# Patient Record
Sex: Female | Born: 1935
Health system: Southern US, Community
[De-identification: ages and names within clinical notes are randomized; demographics above are authoritative.]

## PROBLEM LIST (undated history)

## (undated) DIAGNOSIS — M353 Polymyalgia rheumatica: Secondary | ICD-10-CM

## (undated) DIAGNOSIS — E079 Disorder of thyroid, unspecified: Secondary | ICD-10-CM

## (undated) DIAGNOSIS — I10 Essential (primary) hypertension: Secondary | ICD-10-CM

## (undated) DIAGNOSIS — K432 Incisional hernia without obstruction or gangrene: Secondary | ICD-10-CM

## (undated) DIAGNOSIS — K639 Disease of intestine, unspecified: Secondary | ICD-10-CM

## (undated) DIAGNOSIS — J45909 Unspecified asthma, uncomplicated: Secondary | ICD-10-CM

## (undated) DIAGNOSIS — Z923 Personal history of irradiation: Secondary | ICD-10-CM

## (undated) DIAGNOSIS — L80 Vitiligo: Secondary | ICD-10-CM

## (undated) DIAGNOSIS — M1990 Unspecified osteoarthritis, unspecified site: Secondary | ICD-10-CM

## (undated) DIAGNOSIS — C801 Malignant (primary) neoplasm, unspecified: Secondary | ICD-10-CM

## (undated) HISTORY — DX: Polymyalgia rheumatica: M35.3

## (undated) HISTORY — DX: Malignant (primary) neoplasm, unspecified: C80.1

## (undated) HISTORY — DX: Unspecified asthma, uncomplicated: J45.909

## (undated) HISTORY — DX: Unspecified osteoarthritis, unspecified site: M19.90

## (undated) HISTORY — DX: Disease of intestine, unspecified: K63.9

## (undated) HISTORY — DX: Disorder of thyroid, unspecified: E07.9

## (undated) HISTORY — DX: Vitiligo: L80

## (undated) HISTORY — DX: Essential (primary) hypertension: I10

## (undated) HISTORY — DX: Incisional hernia without obstruction or gangrene: K43.2

---
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
## 1967-09-27 HISTORY — PX: CHOLECYSTECTOMY: SHX55

## 1994-09-26 HISTORY — PX: FOOT SURGERY: SHX648

## 2000-09-26 HISTORY — PX: EYE SURGERY: SHX253

## 2002-09-26 HISTORY — PX: COLON SURGERY: SHX602

## 2004-06-26 ENCOUNTER — Ambulatory Visit: Payer: Self-pay | Admitting: Oncology

## 2004-08-20 ENCOUNTER — Ambulatory Visit: Payer: Self-pay | Admitting: Internal Medicine

## 2004-09-24 ENCOUNTER — Other Ambulatory Visit: Payer: Self-pay

## 2004-09-24 ENCOUNTER — Ambulatory Visit: Payer: Self-pay | Admitting: General Surgery

## 2004-09-26 DIAGNOSIS — C801 Malignant (primary) neoplasm, unspecified: Secondary | ICD-10-CM

## 2004-09-26 HISTORY — DX: Malignant (primary) neoplasm, unspecified: C80.1

## 2004-09-26 HISTORY — PX: BREAST SURGERY: SHX581

## 2004-09-26 HISTORY — PX: BREAST BIOPSY: SHX20

## 2004-09-30 ENCOUNTER — Ambulatory Visit: Payer: Self-pay | Admitting: General Surgery

## 2004-10-08 ENCOUNTER — Ambulatory Visit: Payer: Self-pay | Admitting: Radiation Oncology

## 2004-10-11 ENCOUNTER — Ambulatory Visit: Payer: Self-pay | Admitting: General Surgery

## 2004-10-14 ENCOUNTER — Ambulatory Visit: Payer: Self-pay | Admitting: General Surgery

## 2004-11-01 ENCOUNTER — Ambulatory Visit: Payer: Self-pay | Admitting: Oncology

## 2004-11-08 ENCOUNTER — Ambulatory Visit: Payer: Self-pay | Admitting: General Surgery

## 2004-11-11 ENCOUNTER — Ambulatory Visit: Payer: Self-pay | Admitting: Radiation Oncology

## 2004-11-24 ENCOUNTER — Ambulatory Visit: Payer: Self-pay | Admitting: Radiation Oncology

## 2004-12-25 ENCOUNTER — Ambulatory Visit: Payer: Self-pay | Admitting: Radiation Oncology

## 2005-06-15 ENCOUNTER — Ambulatory Visit: Payer: Self-pay | Admitting: Oncology

## 2005-08-29 ENCOUNTER — Ambulatory Visit: Payer: Self-pay | Admitting: General Surgery

## 2005-09-12 IMAGING — NM NM SENTINAL NODE INJECTION (BREAST) - NO REPORT
1 series · 3 of 3 positions shown · non-contrast
Comparison: none

REASON FOR EXAM: breast CA    OR time 12 oclock
COMMENTS:

[Series 0: breastsentinel · 1.9mm · 1.95mm/px · 3 of 3 frames shown]
[frame 1/3]
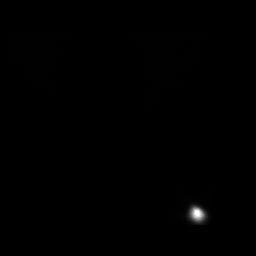
[frame 2/3]
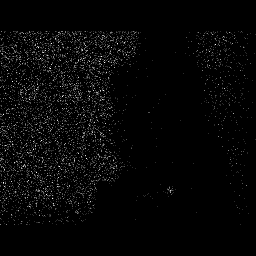
[frame 3/3]
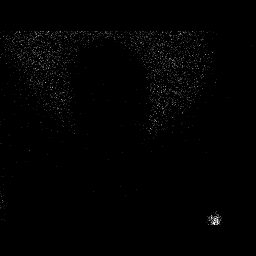

[3 of 3 positions shown; findings below may reference images not displayed]

PROCEDURE:     NM  - NM SENTINEL NODE  BREAST  - October 14, 2004  [DATE]

RESULT:     The patient was informed of the risks and benefits of the
procedure and proper informed consent was obtained.  The patient was brought
to the nuclear medicine suite and placed in a supine position.  The LEFT
breast was evaluated and then prepped and draped in the usual sterile
fashion.  The periareolar region laterally was then anesthetized with
approximately 3 ccs of 1% lidocaine without epinephrine. This area was then
injected with 1.09 mCi of technetium-99m unfiltered Sulfur colloid.
Standard images were obtained in the AP and lateral positions. An area of
increased radiotracer activity is demonstrated within the LEFT axillary
region indicative of a sentinel lymph node.  The patient tolerated the
procedure without complications and was taken to the surgery suite.
IMPRESSION: 1)LEFT breast sentinel node as described above.

## 2005-09-30 IMAGING — CT CT CHEST-ABD-PELV W/ CM
1 of 3 series · 15 of 30 positions shown, 19 images · non-contrast
Comparison: none

REASON FOR EXAM: f/u lymphoma
COMMENTS:

[Series 3: inspace · axial · 0.74mm/px · z∈[-586,-18]mm · 15 of 628 slices shown, 19 images]
[im 30/628  mediastinal]
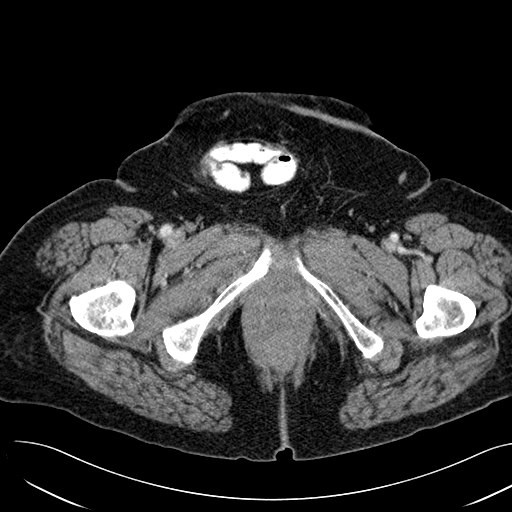
[im 30/628  bone]
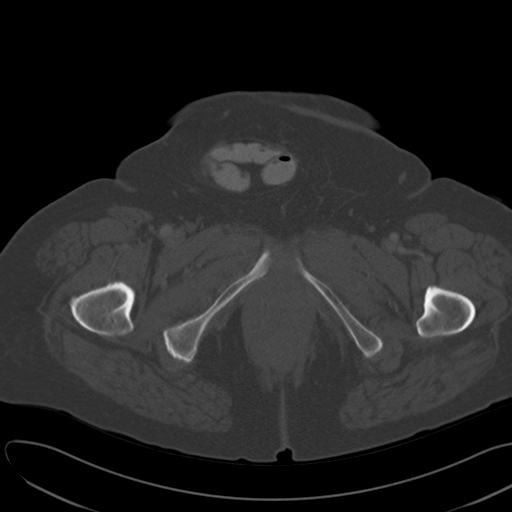
[im 90/628  mediastinal]
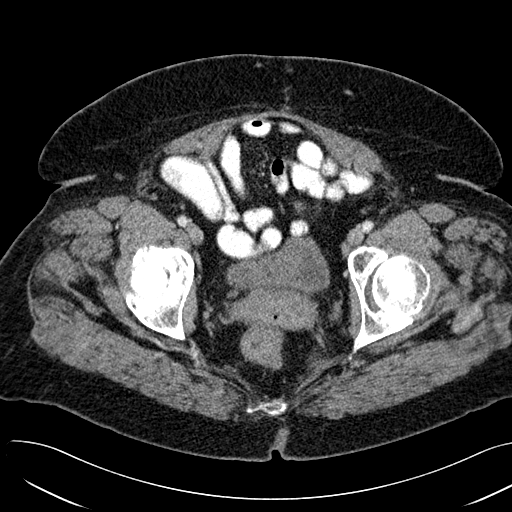
[im 150/628  mediastinal]
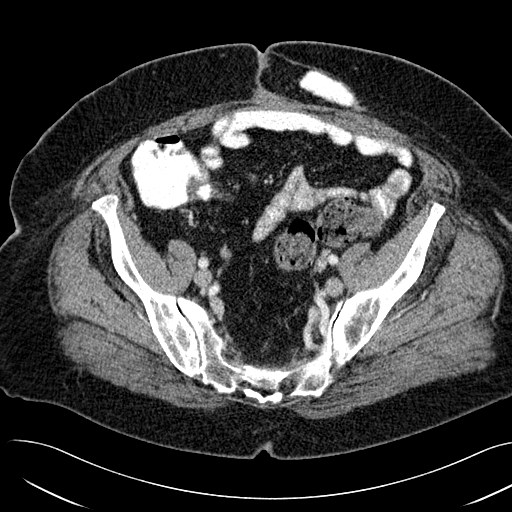
[im 180/628  mediastinal]
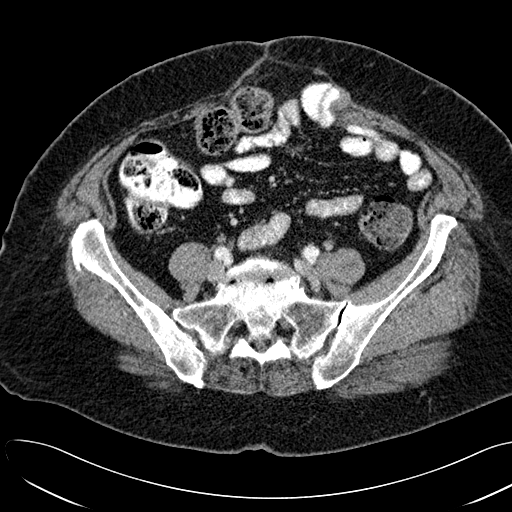
[im 239/628  mediastinal]
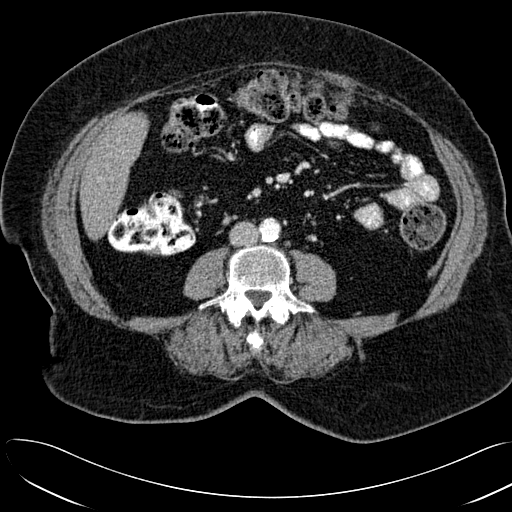
[im 269/628  mediastinal]
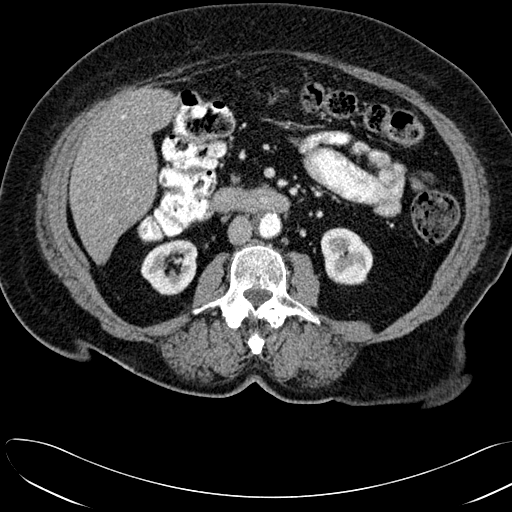
[im 305/628  mediastinal]
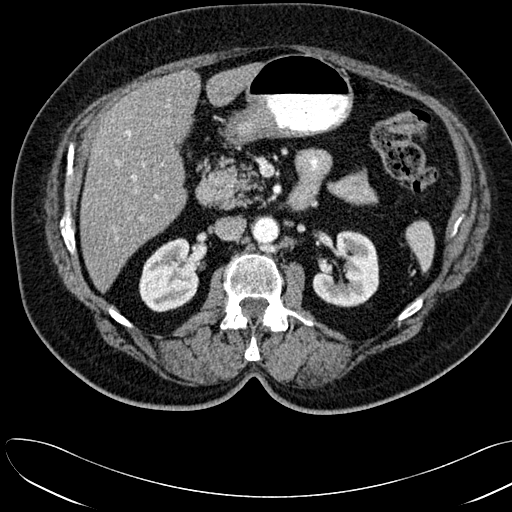
[im 359/628  mediastinal]
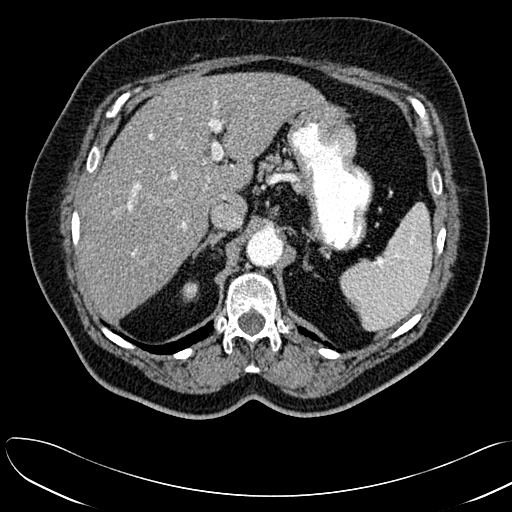
[im 389/628  mediastinal]
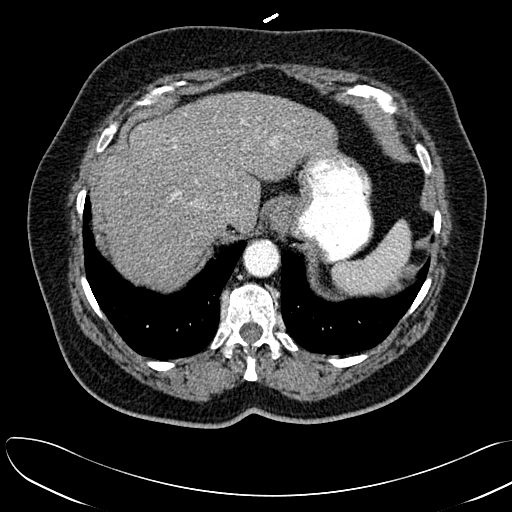
[im 389/628  bone]
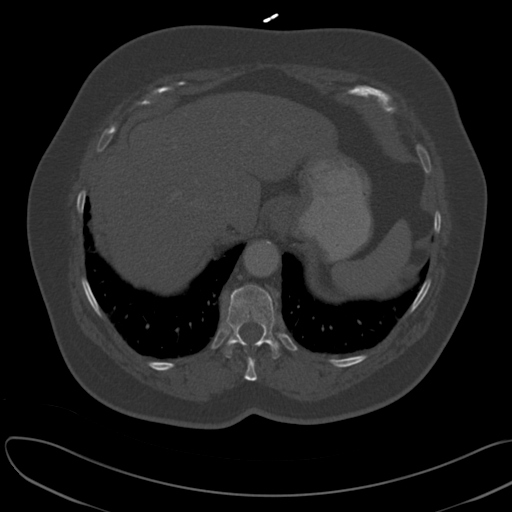
[im 448/628  mediastinal]
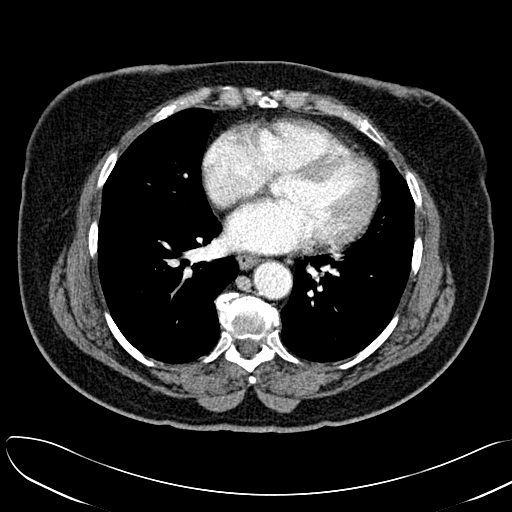
[im 478/628  mediastinal]
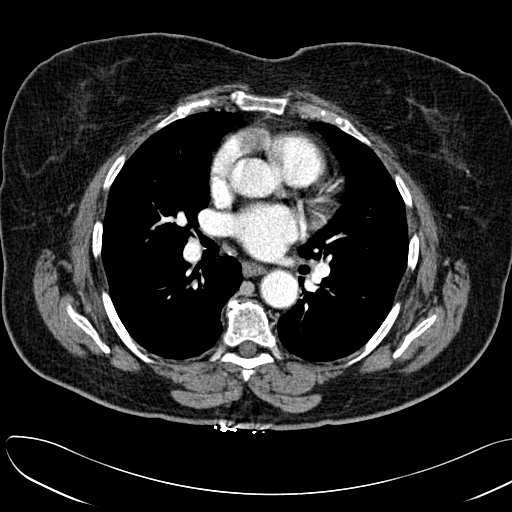
[im 508/628  lung]
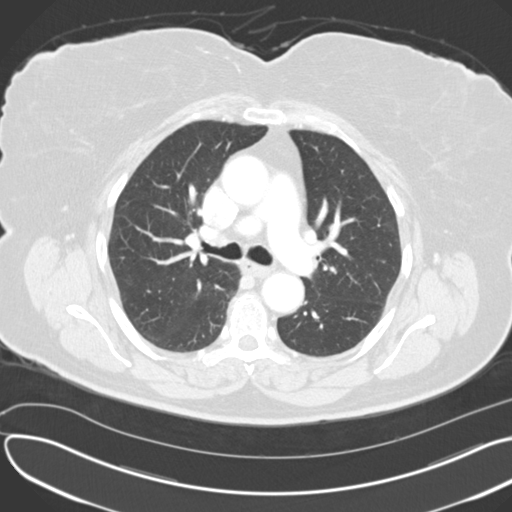
[im 538/628  mediastinal]
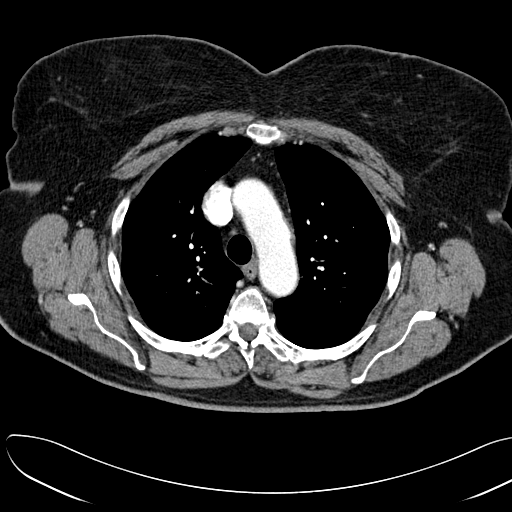
[im 538/628  lung]
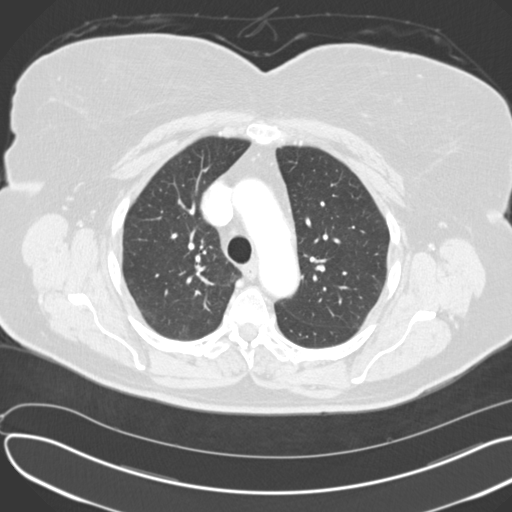
[im 568/628  lung]
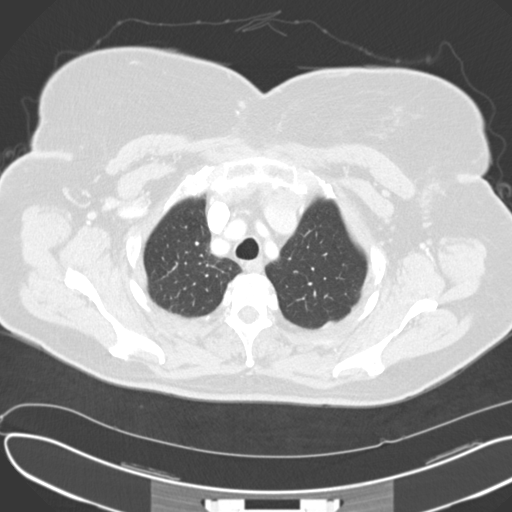
[im 598/628  mediastinal]
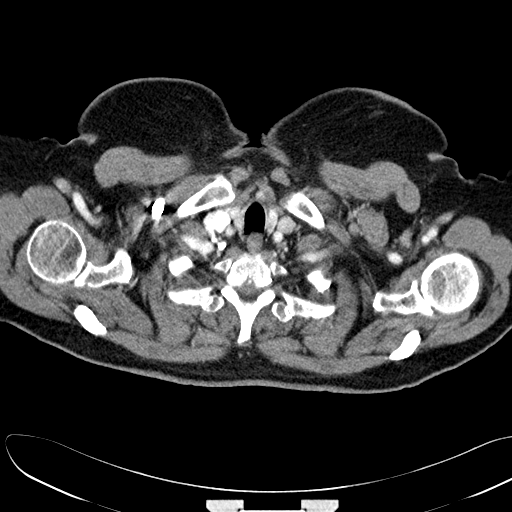
[im 598/628  lung]
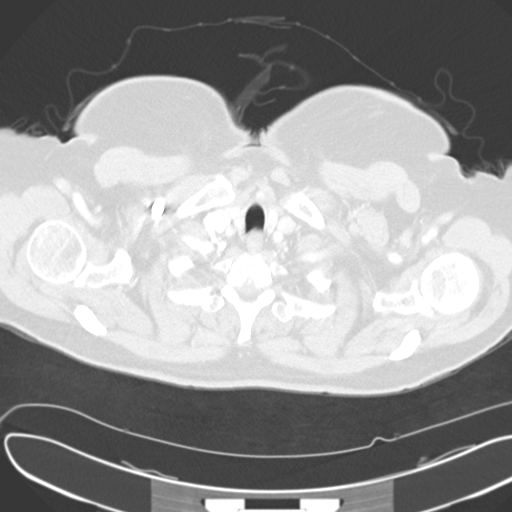

[15 of 30 positions shown; findings below may reference images not displayed]

PROCEDURE:     CT  - CT CHEST ABDOMEN AND PELVIS W  - November 01, 2004  [DATE]

RESULT:        The patient is being re-evaluated for known lymphoma.
Comparison is made to study 04 June, 2003.  At that time, no findings
of active lymphoma were seen.

The patient received 100 cc of Gsovue-5YO for this study.  Within the
mediastinum and hilar regions, I see no pathologic size lymph nodes.  The
retrosternal soft tissues and the axillary regions appear normal.  The
cardiac chambers are top normal in size.  I see no pleural nor pericardial
effusion.  At lung window settings, I see no interstitial nor alveolar
infiltrates.  No abnormal pulmonary nodules are seen.

CT ABDOMEN AND PELVIS

The patient received the aforementioned IV contrast as well as oral
contrast.

The liver exhibits normal density with no focal mass nor evidence of ductal
dilation.  The gallbladder is either contracted or surgically absent.  The
spleen is normal in size and density.  The pancreas, adrenal glands, and
kidneys are normal in appearance.  The para-aortic and pericaval regions
exhibit no evidence of lymphadenopathy.   There is some herniation of fat
into the periumbilical regions and there is a loop of small bowel that
extends into this hernia.  There is no evidence of obstruction here,
however.  This hernia has developed, however, since the prior study.  There
is no free fluid in the abdomen or pelvis.  No pelvic masses are seen.  The
urinary bladder and cervix and atrophic uterus are normal in appearance.
IMPRESSION: I do not see evidence of active lymphoma within the thorax.  No significant
scarring is seen either.

I see no acute abnormality otherwise in the thorax.

I do not see evidence of active lymphoma within the abdomen or pelvis.
Since the prior study there has developed an umbilical hernia to the LEFT of
midline into which a loop of small bowel has migrated.  This does not appear
to be obstructed at this point.

## 2005-11-16 ENCOUNTER — Ambulatory Visit: Payer: Self-pay | Admitting: Radiation Oncology

## 2005-12-12 ENCOUNTER — Ambulatory Visit: Payer: Self-pay | Admitting: Oncology

## 2006-06-12 ENCOUNTER — Ambulatory Visit: Payer: Self-pay | Admitting: Oncology

## 2006-06-26 ENCOUNTER — Ambulatory Visit: Payer: Self-pay | Admitting: Oncology

## 2006-09-05 ENCOUNTER — Ambulatory Visit: Payer: Self-pay | Admitting: General Surgery

## 2006-12-07 ENCOUNTER — Ambulatory Visit: Payer: Self-pay | Admitting: Oncology

## 2006-12-26 ENCOUNTER — Ambulatory Visit: Payer: Self-pay | Admitting: Oncology

## 2007-01-02 ENCOUNTER — Ambulatory Visit: Payer: Self-pay | Admitting: Family Medicine

## 2007-01-10 ENCOUNTER — Ambulatory Visit: Payer: Self-pay | Admitting: Gastroenterology

## 2007-03-21 ENCOUNTER — Ambulatory Visit: Payer: Self-pay | Admitting: Internal Medicine

## 2007-06-27 ENCOUNTER — Ambulatory Visit: Payer: Self-pay | Admitting: Oncology

## 2007-06-28 ENCOUNTER — Ambulatory Visit: Payer: Self-pay | Admitting: Oncology

## 2007-07-28 ENCOUNTER — Ambulatory Visit: Payer: Self-pay | Admitting: Oncology

## 2007-09-10 ENCOUNTER — Ambulatory Visit: Payer: Self-pay | Admitting: Oncology

## 2008-02-25 ENCOUNTER — Ambulatory Visit: Payer: Self-pay | Admitting: Oncology

## 2008-09-12 ENCOUNTER — Ambulatory Visit: Payer: Self-pay | Admitting: General Surgery

## 2009-03-04 ENCOUNTER — Ambulatory Visit: Payer: Self-pay | Admitting: Internal Medicine

## 2009-08-07 ENCOUNTER — Ambulatory Visit: Payer: Self-pay | Admitting: Anesthesiology

## 2009-09-14 ENCOUNTER — Ambulatory Visit: Payer: Self-pay | Admitting: General Surgery

## 2009-09-23 ENCOUNTER — Ambulatory Visit: Payer: Self-pay | Admitting: General Surgery

## 2009-09-26 HISTORY — PX: OTHER SURGICAL HISTORY: SHX169

## 2010-04-01 ENCOUNTER — Observation Stay: Payer: Self-pay | Admitting: Internal Medicine

## 2010-06-15 ENCOUNTER — Ambulatory Visit: Payer: Self-pay | Admitting: General Surgery

## 2010-09-26 DIAGNOSIS — L80 Vitiligo: Secondary | ICD-10-CM

## 2010-09-26 HISTORY — DX: Vitiligo: L80

## 2010-10-18 ENCOUNTER — Ambulatory Visit: Payer: Self-pay | Admitting: General Surgery

## 2010-10-20 ENCOUNTER — Ambulatory Visit: Payer: Self-pay | Admitting: General Surgery

## 2010-11-15 ENCOUNTER — Ambulatory Visit: Payer: Self-pay | Admitting: General Surgery

## 2010-11-16 LAB — PATHOLOGY REPORT

## 2011-04-28 ENCOUNTER — Ambulatory Visit: Payer: Self-pay | Admitting: General Surgery

## 2011-09-27 HISTORY — PX: HERNIA REPAIR: SHX51

## 2011-09-28 DIAGNOSIS — M5412 Radiculopathy, cervical region: Secondary | ICD-10-CM | POA: Diagnosis not present

## 2011-09-28 DIAGNOSIS — M25519 Pain in unspecified shoulder: Secondary | ICD-10-CM | POA: Diagnosis not present

## 2011-09-28 DIAGNOSIS — G5600 Carpal tunnel syndrome, unspecified upper limb: Secondary | ICD-10-CM | POA: Diagnosis not present

## 2011-10-28 DIAGNOSIS — M069 Rheumatoid arthritis, unspecified: Secondary | ICD-10-CM | POA: Diagnosis not present

## 2011-11-01 DIAGNOSIS — M159 Polyosteoarthritis, unspecified: Secondary | ICD-10-CM | POA: Diagnosis not present

## 2011-11-01 DIAGNOSIS — M353 Polymyalgia rheumatica: Secondary | ICD-10-CM | POA: Diagnosis not present

## 2011-11-03 DIAGNOSIS — M064 Inflammatory polyarthropathy: Secondary | ICD-10-CM | POA: Diagnosis not present

## 2011-11-03 DIAGNOSIS — I10 Essential (primary) hypertension: Secondary | ICD-10-CM | POA: Diagnosis not present

## 2011-11-03 DIAGNOSIS — J45909 Unspecified asthma, uncomplicated: Secondary | ICD-10-CM | POA: Diagnosis not present

## 2011-11-03 DIAGNOSIS — M159 Polyosteoarthritis, unspecified: Secondary | ICD-10-CM | POA: Diagnosis not present

## 2011-11-28 ENCOUNTER — Ambulatory Visit: Payer: Self-pay | Admitting: General Surgery

## 2011-11-28 DIAGNOSIS — Z853 Personal history of malignant neoplasm of breast: Secondary | ICD-10-CM | POA: Diagnosis not present

## 2011-11-28 DIAGNOSIS — M353 Polymyalgia rheumatica: Secondary | ICD-10-CM | POA: Diagnosis not present

## 2011-11-28 DIAGNOSIS — R928 Other abnormal and inconclusive findings on diagnostic imaging of breast: Secondary | ICD-10-CM | POA: Diagnosis not present

## 2011-12-06 DIAGNOSIS — Z853 Personal history of malignant neoplasm of breast: Secondary | ICD-10-CM | POA: Diagnosis not present

## 2011-12-06 DIAGNOSIS — M159 Polyosteoarthritis, unspecified: Secondary | ICD-10-CM | POA: Diagnosis not present

## 2011-12-06 DIAGNOSIS — M353 Polymyalgia rheumatica: Secondary | ICD-10-CM | POA: Diagnosis not present

## 2012-01-03 DIAGNOSIS — M353 Polymyalgia rheumatica: Secondary | ICD-10-CM | POA: Diagnosis not present

## 2012-01-27 DIAGNOSIS — M353 Polymyalgia rheumatica: Secondary | ICD-10-CM | POA: Diagnosis not present

## 2012-02-08 DIAGNOSIS — J019 Acute sinusitis, unspecified: Secondary | ICD-10-CM | POA: Diagnosis not present

## 2012-02-08 DIAGNOSIS — J069 Acute upper respiratory infection, unspecified: Secondary | ICD-10-CM | POA: Diagnosis not present

## 2012-02-21 DIAGNOSIS — Z79899 Other long term (current) drug therapy: Secondary | ICD-10-CM | POA: Diagnosis not present

## 2012-03-01 DIAGNOSIS — M316 Other giant cell arteritis: Secondary | ICD-10-CM | POA: Diagnosis not present

## 2012-03-01 DIAGNOSIS — M353 Polymyalgia rheumatica: Secondary | ICD-10-CM | POA: Diagnosis not present

## 2012-03-01 DIAGNOSIS — M159 Polyosteoarthritis, unspecified: Secondary | ICD-10-CM | POA: Diagnosis not present

## 2012-03-07 DIAGNOSIS — Z79899 Other long term (current) drug therapy: Secondary | ICD-10-CM | POA: Diagnosis not present

## 2012-03-12 DIAGNOSIS — R0602 Shortness of breath: Secondary | ICD-10-CM | POA: Diagnosis not present

## 2012-03-12 DIAGNOSIS — M064 Inflammatory polyarthropathy: Secondary | ICD-10-CM | POA: Diagnosis not present

## 2012-03-12 DIAGNOSIS — M159 Polyosteoarthritis, unspecified: Secondary | ICD-10-CM | POA: Diagnosis not present

## 2012-03-12 DIAGNOSIS — I10 Essential (primary) hypertension: Secondary | ICD-10-CM | POA: Diagnosis not present

## 2012-03-12 DIAGNOSIS — Z85038 Personal history of other malignant neoplasm of large intestine: Secondary | ICD-10-CM | POA: Diagnosis not present

## 2012-03-21 DIAGNOSIS — M353 Polymyalgia rheumatica: Secondary | ICD-10-CM | POA: Diagnosis not present

## 2012-04-17 DIAGNOSIS — Z79899 Other long term (current) drug therapy: Secondary | ICD-10-CM | POA: Diagnosis not present

## 2012-04-26 DIAGNOSIS — R1904 Left lower quadrant abdominal swelling, mass and lump: Secondary | ICD-10-CM | POA: Diagnosis not present

## 2012-04-26 DIAGNOSIS — Z86010 Personal history of colonic polyps: Secondary | ICD-10-CM | POA: Diagnosis not present

## 2012-04-30 ENCOUNTER — Inpatient Hospital Stay: Payer: Self-pay | Admitting: Surgery

## 2012-04-30 DIAGNOSIS — N390 Urinary tract infection, site not specified: Secondary | ICD-10-CM | POA: Diagnosis not present

## 2012-04-30 DIAGNOSIS — Z808 Family history of malignant neoplasm of other organs or systems: Secondary | ICD-10-CM | POA: Diagnosis not present

## 2012-04-30 DIAGNOSIS — Z9049 Acquired absence of other specified parts of digestive tract: Secondary | ICD-10-CM | POA: Diagnosis not present

## 2012-04-30 DIAGNOSIS — K439 Ventral hernia without obstruction or gangrene: Secondary | ICD-10-CM | POA: Diagnosis not present

## 2012-04-30 DIAGNOSIS — R109 Unspecified abdominal pain: Secondary | ICD-10-CM | POA: Diagnosis not present

## 2012-04-30 DIAGNOSIS — K430 Incisional hernia with obstruction, without gangrene: Secondary | ICD-10-CM | POA: Diagnosis not present

## 2012-04-30 DIAGNOSIS — I10 Essential (primary) hypertension: Secondary | ICD-10-CM | POA: Diagnosis present

## 2012-04-30 DIAGNOSIS — Z9089 Acquired absence of other organs: Secondary | ICD-10-CM | POA: Diagnosis not present

## 2012-04-30 DIAGNOSIS — Z836 Family history of other diseases of the respiratory system: Secondary | ICD-10-CM | POA: Diagnosis not present

## 2012-04-30 DIAGNOSIS — Z853 Personal history of malignant neoplasm of breast: Secondary | ICD-10-CM | POA: Diagnosis not present

## 2012-04-30 DIAGNOSIS — M069 Rheumatoid arthritis, unspecified: Secondary | ICD-10-CM | POA: Diagnosis present

## 2012-04-30 DIAGNOSIS — Z801 Family history of malignant neoplasm of trachea, bronchus and lung: Secondary | ICD-10-CM | POA: Diagnosis not present

## 2012-04-30 DIAGNOSIS — Z886 Allergy status to analgesic agent status: Secondary | ICD-10-CM | POA: Diagnosis not present

## 2012-04-30 DIAGNOSIS — Z885 Allergy status to narcotic agent status: Secondary | ICD-10-CM | POA: Diagnosis not present

## 2012-04-30 DIAGNOSIS — Z881 Allergy status to other antibiotic agents status: Secondary | ICD-10-CM | POA: Diagnosis not present

## 2012-04-30 DIAGNOSIS — K56609 Unspecified intestinal obstruction, unspecified as to partial versus complete obstruction: Secondary | ICD-10-CM | POA: Diagnosis not present

## 2012-04-30 LAB — URINALYSIS, COMPLETE
Bacteria: NONE SEEN
Bilirubin,UR: NEGATIVE
Glucose,UR: NEGATIVE mg/dL (ref 0–75)
Nitrite: NEGATIVE
Protein: 30
RBC,UR: 20 /HPF (ref 0–5)
Specific Gravity: 1.036 (ref 1.003–1.030)
Squamous Epithelial: 1
WBC UR: 28 /HPF (ref 0–5)

## 2012-04-30 LAB — DIFFERENTIAL
Basophil %: 0.5 %
Eosinophil #: 0.1 10*3/uL (ref 0.0–0.7)
Eosinophil %: 0.5 %
Lymphocyte #: 1.6 10*3/uL (ref 1.0–3.6)
Monocyte #: 0.7 x10 3/mm (ref 0.2–0.9)
Neutrophil #: 10.1 10*3/uL — ABNORMAL HIGH (ref 1.4–6.5)

## 2012-04-30 LAB — COMPREHENSIVE METABOLIC PANEL
Anion Gap: 8 (ref 7–16)
BUN: 14 mg/dL (ref 7–18)
Bilirubin,Total: 0.3 mg/dL (ref 0.2–1.0)
Chloride: 101 mmol/L (ref 98–107)
Co2: 30 mmol/L (ref 21–32)
EGFR (African American): >60
EGFR (Non-African Amer.): >60
Osmolality: 284 (ref 275–301)
Potassium: 3.9 mmol/L (ref 3.5–5.1)
SGOT(AST): 20 U/L (ref 15–37)
SGPT (ALT): 22 U/L (ref 12–78)
Sodium: 139 mmol/L (ref 136–145)
Total Protein: 7.7 g/dL (ref 6.4–8.2)

## 2012-04-30 LAB — CBC
HCT: 40.6 % (ref 35.0–47.0)
HGB: 13.3 g/dL (ref 12.0–16.0)
MCH: 29 pg (ref 26.0–34.0)
MCHC: 32.7 g/dL (ref 32.0–36.0)

## 2012-04-30 LAB — LIPASE, BLOOD: Lipase: 151 U/L (ref 73–393)

## 2012-05-01 DIAGNOSIS — N390 Urinary tract infection, site not specified: Secondary | ICD-10-CM | POA: Diagnosis not present

## 2012-05-01 DIAGNOSIS — K430 Incisional hernia with obstruction, without gangrene: Secondary | ICD-10-CM | POA: Diagnosis not present

## 2012-05-01 LAB — CBC WITH DIFFERENTIAL/PLATELET
Basophil %: 0.3 %
HCT: 34.4 % — ABNORMAL LOW (ref 35.0–47.0)
Lymphocyte #: 2.1 10*3/uL (ref 1.0–3.6)
Lymphocyte %: 18.1 %
MCH: 30.1 pg (ref 26.0–34.0)
MCHC: 34.2 g/dL (ref 32.0–36.0)
MCV: 88 fL (ref 80–100)
Monocyte #: 0.9 x10 3/mm (ref 0.2–0.9)
Neutrophil #: 8.2 10*3/uL — ABNORMAL HIGH (ref 1.4–6.5)
Platelet: 220 10*3/uL (ref 150–440)
RDW: 16.2 % — ABNORMAL HIGH (ref 11.5–14.5)

## 2012-05-01 LAB — BASIC METABOLIC PANEL
BUN: 12 mg/dL (ref 7–18)
Creatinine: 0.81 mg/dL (ref 0.60–1.30)
EGFR (African American): >60
EGFR (Non-African Amer.): >60
Glucose: 116 mg/dL — ABNORMAL HIGH (ref 65–99)
Osmolality: 280 (ref 275–301)
Potassium: 4 mmol/L (ref 3.5–5.1)
Sodium: 140 mmol/L (ref 136–145)

## 2012-05-02 DIAGNOSIS — N390 Urinary tract infection, site not specified: Secondary | ICD-10-CM | POA: Diagnosis not present

## 2012-05-02 DIAGNOSIS — K430 Incisional hernia with obstruction, without gangrene: Secondary | ICD-10-CM | POA: Diagnosis not present

## 2012-05-02 LAB — CBC WITH DIFFERENTIAL/PLATELET
Basophil #: 0 10*3/uL (ref 0.0–0.1)
Basophil %: 0.5 %
Eosinophil %: 1.5 %
HCT: 33.8 % — ABNORMAL LOW (ref 35.0–47.0)
Lymphocyte #: 3 10*3/uL (ref 1.0–3.6)
Lymphocyte %: 32.5 %
MCH: 30.7 pg (ref 26.0–34.0)
MCV: 89 fL (ref 80–100)
Monocyte #: 0.7 x10 3/mm (ref 0.2–0.9)
Neutrophil %: 57.5 %
Platelet: 198 10*3/uL (ref 150–440)
RDW: 16.1 % — ABNORMAL HIGH (ref 11.5–14.5)

## 2012-05-09 DIAGNOSIS — R6889 Other general symptoms and signs: Secondary | ICD-10-CM | POA: Diagnosis not present

## 2012-05-10 ENCOUNTER — Inpatient Hospital Stay: Payer: Self-pay | Admitting: Surgery

## 2012-05-10 DIAGNOSIS — K436 Other and unspecified ventral hernia with obstruction, without gangrene: Secondary | ICD-10-CM | POA: Diagnosis not present

## 2012-05-10 DIAGNOSIS — K430 Incisional hernia with obstruction, without gangrene: Secondary | ICD-10-CM | POA: Diagnosis not present

## 2012-05-10 DIAGNOSIS — Z885 Allergy status to narcotic agent status: Secondary | ICD-10-CM | POA: Diagnosis not present

## 2012-05-10 DIAGNOSIS — Z9049 Acquired absence of other specified parts of digestive tract: Secondary | ICD-10-CM | POA: Diagnosis not present

## 2012-05-10 DIAGNOSIS — R109 Unspecified abdominal pain: Secondary | ICD-10-CM | POA: Diagnosis not present

## 2012-05-10 DIAGNOSIS — K439 Ventral hernia without obstruction or gangrene: Secondary | ICD-10-CM | POA: Diagnosis not present

## 2012-05-10 DIAGNOSIS — M069 Rheumatoid arthritis, unspecified: Secondary | ICD-10-CM | POA: Diagnosis not present

## 2012-05-10 DIAGNOSIS — K56609 Unspecified intestinal obstruction, unspecified as to partial versus complete obstruction: Secondary | ICD-10-CM | POA: Diagnosis not present

## 2012-05-10 DIAGNOSIS — Z886 Allergy status to analgesic agent status: Secondary | ICD-10-CM | POA: Diagnosis not present

## 2012-05-10 DIAGNOSIS — Z01810 Encounter for preprocedural cardiovascular examination: Secondary | ICD-10-CM

## 2012-05-10 DIAGNOSIS — R110 Nausea: Secondary | ICD-10-CM | POA: Diagnosis not present

## 2012-05-10 DIAGNOSIS — I10 Essential (primary) hypertension: Secondary | ICD-10-CM | POA: Diagnosis not present

## 2012-05-10 DIAGNOSIS — IMO0002 Reserved for concepts with insufficient information to code with codable children: Secondary | ICD-10-CM | POA: Diagnosis not present

## 2012-05-10 DIAGNOSIS — Z881 Allergy status to other antibiotic agents status: Secondary | ICD-10-CM | POA: Diagnosis not present

## 2012-05-10 DIAGNOSIS — R1110 Vomiting, unspecified: Secondary | ICD-10-CM | POA: Diagnosis not present

## 2012-05-10 DIAGNOSIS — K660 Peritoneal adhesions (postprocedural) (postinfection): Secondary | ICD-10-CM | POA: Diagnosis not present

## 2012-05-10 DIAGNOSIS — Z9089 Acquired absence of other organs: Secondary | ICD-10-CM | POA: Diagnosis not present

## 2012-05-10 LAB — CBC WITH DIFFERENTIAL/PLATELET
Basophil %: 0.9 %
Eosinophil %: 0.2 %
HCT: 39.5 % (ref 35.0–47.0)
HGB: 13.1 g/dL (ref 12.0–16.0)
Lymphocyte %: 20 %
Monocyte %: 6.1 %
Neutrophil #: 9.5 10*3/uL — ABNORMAL HIGH (ref 1.4–6.5)
RBC: 4.49 10*6/uL (ref 3.80–5.20)
WBC: 13 10*3/uL — ABNORMAL HIGH (ref 3.6–11.0)

## 2012-05-10 LAB — COMPREHENSIVE METABOLIC PANEL
Albumin: 3.9 g/dL (ref 3.4–5.0)
Alkaline Phosphatase: 67 U/L (ref 50–136)
Bilirubin,Total: 0.4 mg/dL (ref 0.2–1.0)
Chloride: 103 mmol/L (ref 98–107)
Co2: 25 mmol/L (ref 21–32)
Creatinine: 0.85 mg/dL (ref 0.60–1.30)
EGFR (African American): >60
EGFR (Non-African Amer.): >60
Osmolality: 282 (ref 275–301)
SGOT(AST): 28 U/L (ref 15–37)
SGPT (ALT): 22 U/L (ref 12–78)

## 2012-05-10 LAB — PROTIME-INR: INR: 0.8

## 2012-05-11 LAB — CBC WITH DIFFERENTIAL/PLATELET
Basophil #: 0.1 10*3/uL (ref 0.0–0.1)
Basophil %: 0.5 %
Eosinophil #: 0 10*3/uL (ref 0.0–0.7)
Eosinophil %: 0.2 %
HCT: 35.9 % (ref 35.0–47.0)
HGB: 11.9 g/dL — ABNORMAL LOW (ref 12.0–16.0)
Lymphocyte %: 10.1 %
MCH: 29.4 pg (ref 26.0–34.0)
MCV: 89 fL (ref 80–100)
Monocyte %: 3.9 %
Neutrophil #: 9.9 10*3/uL — ABNORMAL HIGH (ref 1.4–6.5)
Neutrophil %: 85.3 %
Platelet: 260 10*3/uL (ref 150–440)
RBC: 4.05 10*6/uL (ref 3.80–5.20)
RDW: 15.9 % — ABNORMAL HIGH (ref 11.5–14.5)
WBC: 11.7 10*3/uL — ABNORMAL HIGH (ref 3.6–11.0)

## 2012-05-11 LAB — BASIC METABOLIC PANEL
Anion Gap: 5 — ABNORMAL LOW (ref 7–16)
BUN: 14 mg/dL (ref 7–18)
Chloride: 103 mmol/L (ref 98–107)
Co2: 26 mmol/L (ref 21–32)
Creatinine: 1 mg/dL (ref 0.60–1.30)
Osmolality: 280 (ref 275–301)
Potassium: 6.2 mmol/L — ABNORMAL HIGH (ref 3.5–5.1)
Sodium: 134 mmol/L — ABNORMAL LOW (ref 136–145)

## 2012-05-11 LAB — POTASSIUM: Potassium: 4.5 mmol/L (ref 3.5–5.1)

## 2012-05-12 LAB — CBC WITH DIFFERENTIAL/PLATELET
Basophil #: 0 10*3/uL (ref 0.0–0.1)
Basophil %: 0.1 %
Eosinophil #: 0 10*3/uL (ref 0.0–0.7)
Eosinophil %: 0 %
HGB: 10.7 g/dL — ABNORMAL LOW (ref 12.0–16.0)
Lymphocyte #: 1.2 10*3/uL (ref 1.0–3.6)
Lymphocyte %: 8.1 %
MCH: 29 pg (ref 26.0–34.0)
MCHC: 32.6 g/dL (ref 32.0–36.0)
MCV: 89 fL (ref 80–100)
Monocyte #: 1.3 x10 3/mm — ABNORMAL HIGH (ref 0.2–0.9)
Monocyte %: 8.6 %
Neutrophil #: 12.7 10*3/uL — ABNORMAL HIGH (ref 1.4–6.5)
Neutrophil %: 83.2 %
RBC: 3.7 10*6/uL — ABNORMAL LOW (ref 3.80–5.20)
RDW: 15.9 % — ABNORMAL HIGH (ref 11.5–14.5)

## 2012-05-12 LAB — BASIC METABOLIC PANEL
BUN: 16 mg/dL (ref 7–18)
Calcium, Total: 8.8 mg/dL (ref 8.5–10.1)
Chloride: 105 mmol/L (ref 98–107)
Co2: 23 mmol/L (ref 21–32)
Creatinine: 1.1 mg/dL (ref 0.60–1.30)
EGFR (African American): 54 — ABNORMAL LOW
EGFR (African American): >60
EGFR (Non-African Amer.): 46 — ABNORMAL LOW
EGFR (Non-African Amer.): >60
Glucose: 162 mg/dL — ABNORMAL HIGH (ref 65–99)
Osmolality: 274 (ref 275–301)
Potassium: 5.6 mmol/L — ABNORMAL HIGH (ref 3.5–5.1)
Sodium: 135 mmol/L — ABNORMAL LOW (ref 136–145)

## 2012-05-13 LAB — CBC WITH DIFFERENTIAL/PLATELET
Basophil #: 0 10*3/uL (ref 0.0–0.1)
Basophil %: 0.2 %
Eosinophil #: 0 10*3/uL (ref 0.0–0.7)
HGB: 8.9 g/dL — ABNORMAL LOW (ref 12.0–16.0)
Lymphocyte %: 9 %
MCH: 28.9 pg (ref 26.0–34.0)
MCHC: 32.3 g/dL (ref 32.0–36.0)
Monocyte #: 1 x10 3/mm — ABNORMAL HIGH (ref 0.2–0.9)
Monocyte %: 8.1 %
Neutrophil %: 82.7 %
Platelet: 207 10*3/uL (ref 150–440)
RDW: 16 % — ABNORMAL HIGH (ref 11.5–14.5)

## 2012-05-13 LAB — BASIC METABOLIC PANEL
Anion Gap: 7 (ref 7–16)
BUN: 12 mg/dL (ref 7–18)
Creatinine: 0.89 mg/dL (ref 0.60–1.30)
EGFR (Non-African Amer.): 60 — ABNORMAL LOW
Glucose: 172 mg/dL — ABNORMAL HIGH (ref 65–99)
Osmolality: 276 (ref 275–301)
Potassium: 4.1 mmol/L (ref 3.5–5.1)
Sodium: 136 mmol/L (ref 136–145)

## 2012-05-14 LAB — CBC WITH DIFFERENTIAL/PLATELET
Basophil %: 0 %
Eosinophil #: 0 10*3/uL (ref 0.0–0.7)
Eosinophil %: 0.1 %
HCT: 27.9 % — ABNORMAL LOW (ref 35.0–47.0)
HGB: 9 g/dL — ABNORMAL LOW (ref 12.0–16.0)
Lymphocyte #: 0.9 10*3/uL — ABNORMAL LOW (ref 1.0–3.6)
MCH: 28.6 pg (ref 26.0–34.0)
MCHC: 32.4 g/dL (ref 32.0–36.0)
MCV: 88 fL (ref 80–100)
Monocyte #: 0.9 x10 3/mm (ref 0.2–0.9)
Monocyte %: 7.7 %
Neutrophil #: 10 10*3/uL — ABNORMAL HIGH (ref 1.4–6.5)
Platelet: 242 10*3/uL (ref 150–440)
RBC: 3.16 10*6/uL — ABNORMAL LOW (ref 3.80–5.20)

## 2012-05-14 LAB — BASIC METABOLIC PANEL
Calcium, Total: 9.2 mg/dL (ref 8.5–10.1)
Chloride: 102 mmol/L (ref 98–107)
Co2: 27 mmol/L (ref 21–32)
EGFR (African American): >60
Glucose: 163 mg/dL — ABNORMAL HIGH (ref 65–99)
Sodium: 138 mmol/L (ref 136–145)

## 2012-05-21 DIAGNOSIS — M159 Polyosteoarthritis, unspecified: Secondary | ICD-10-CM | POA: Diagnosis not present

## 2012-05-21 DIAGNOSIS — IMO0001 Reserved for inherently not codable concepts without codable children: Secondary | ICD-10-CM | POA: Diagnosis not present

## 2012-05-21 DIAGNOSIS — Z853 Personal history of malignant neoplasm of breast: Secondary | ICD-10-CM | POA: Diagnosis not present

## 2012-05-21 DIAGNOSIS — Z48815 Encounter for surgical aftercare following surgery on the digestive system: Secondary | ICD-10-CM | POA: Diagnosis not present

## 2012-05-21 DIAGNOSIS — M069 Rheumatoid arthritis, unspecified: Secondary | ICD-10-CM | POA: Diagnosis not present

## 2012-05-21 DIAGNOSIS — I10 Essential (primary) hypertension: Secondary | ICD-10-CM | POA: Diagnosis not present

## 2012-05-22 DIAGNOSIS — IMO0001 Reserved for inherently not codable concepts without codable children: Secondary | ICD-10-CM | POA: Diagnosis not present

## 2012-05-22 DIAGNOSIS — Z853 Personal history of malignant neoplasm of breast: Secondary | ICD-10-CM | POA: Diagnosis not present

## 2012-05-22 DIAGNOSIS — M069 Rheumatoid arthritis, unspecified: Secondary | ICD-10-CM | POA: Diagnosis not present

## 2012-05-22 DIAGNOSIS — I10 Essential (primary) hypertension: Secondary | ICD-10-CM | POA: Diagnosis not present

## 2012-05-22 DIAGNOSIS — M159 Polyosteoarthritis, unspecified: Secondary | ICD-10-CM | POA: Diagnosis not present

## 2012-05-22 DIAGNOSIS — Z48815 Encounter for surgical aftercare following surgery on the digestive system: Secondary | ICD-10-CM | POA: Diagnosis not present

## 2012-05-24 DIAGNOSIS — Z853 Personal history of malignant neoplasm of breast: Secondary | ICD-10-CM | POA: Diagnosis not present

## 2012-05-24 DIAGNOSIS — M069 Rheumatoid arthritis, unspecified: Secondary | ICD-10-CM | POA: Diagnosis not present

## 2012-05-24 DIAGNOSIS — I10 Essential (primary) hypertension: Secondary | ICD-10-CM | POA: Diagnosis not present

## 2012-05-24 DIAGNOSIS — M159 Polyosteoarthritis, unspecified: Secondary | ICD-10-CM | POA: Diagnosis not present

## 2012-05-24 DIAGNOSIS — Z48815 Encounter for surgical aftercare following surgery on the digestive system: Secondary | ICD-10-CM | POA: Diagnosis not present

## 2012-05-24 DIAGNOSIS — IMO0001 Reserved for inherently not codable concepts without codable children: Secondary | ICD-10-CM | POA: Diagnosis not present

## 2012-05-30 DIAGNOSIS — M159 Polyosteoarthritis, unspecified: Secondary | ICD-10-CM | POA: Diagnosis not present

## 2012-05-30 DIAGNOSIS — IMO0001 Reserved for inherently not codable concepts without codable children: Secondary | ICD-10-CM | POA: Diagnosis not present

## 2012-05-30 DIAGNOSIS — Z853 Personal history of malignant neoplasm of breast: Secondary | ICD-10-CM | POA: Diagnosis not present

## 2012-05-30 DIAGNOSIS — M069 Rheumatoid arthritis, unspecified: Secondary | ICD-10-CM | POA: Diagnosis not present

## 2012-05-30 DIAGNOSIS — Z48815 Encounter for surgical aftercare following surgery on the digestive system: Secondary | ICD-10-CM | POA: Diagnosis not present

## 2012-05-30 DIAGNOSIS — I10 Essential (primary) hypertension: Secondary | ICD-10-CM | POA: Diagnosis not present

## 2012-06-01 DIAGNOSIS — Z853 Personal history of malignant neoplasm of breast: Secondary | ICD-10-CM | POA: Diagnosis not present

## 2012-06-01 DIAGNOSIS — M159 Polyosteoarthritis, unspecified: Secondary | ICD-10-CM | POA: Diagnosis not present

## 2012-06-01 DIAGNOSIS — IMO0001 Reserved for inherently not codable concepts without codable children: Secondary | ICD-10-CM | POA: Diagnosis not present

## 2012-06-01 DIAGNOSIS — I10 Essential (primary) hypertension: Secondary | ICD-10-CM | POA: Diagnosis not present

## 2012-06-01 DIAGNOSIS — Z48815 Encounter for surgical aftercare following surgery on the digestive system: Secondary | ICD-10-CM | POA: Diagnosis not present

## 2012-06-01 DIAGNOSIS — M069 Rheumatoid arthritis, unspecified: Secondary | ICD-10-CM | POA: Diagnosis not present

## 2012-06-06 DIAGNOSIS — M159 Polyosteoarthritis, unspecified: Secondary | ICD-10-CM | POA: Diagnosis not present

## 2012-06-06 DIAGNOSIS — M069 Rheumatoid arthritis, unspecified: Secondary | ICD-10-CM | POA: Diagnosis not present

## 2012-06-06 DIAGNOSIS — I10 Essential (primary) hypertension: Secondary | ICD-10-CM | POA: Diagnosis not present

## 2012-06-06 DIAGNOSIS — Z853 Personal history of malignant neoplasm of breast: Secondary | ICD-10-CM | POA: Diagnosis not present

## 2012-06-06 DIAGNOSIS — Z48815 Encounter for surgical aftercare following surgery on the digestive system: Secondary | ICD-10-CM | POA: Diagnosis not present

## 2012-06-06 DIAGNOSIS — IMO0001 Reserved for inherently not codable concepts without codable children: Secondary | ICD-10-CM | POA: Diagnosis not present

## 2012-06-08 DIAGNOSIS — Z853 Personal history of malignant neoplasm of breast: Secondary | ICD-10-CM | POA: Diagnosis not present

## 2012-06-08 DIAGNOSIS — R109 Unspecified abdominal pain: Secondary | ICD-10-CM | POA: Diagnosis not present

## 2012-06-08 DIAGNOSIS — M316 Other giant cell arteritis: Secondary | ICD-10-CM | POA: Diagnosis not present

## 2012-06-08 DIAGNOSIS — IMO0001 Reserved for inherently not codable concepts without codable children: Secondary | ICD-10-CM | POA: Diagnosis not present

## 2012-06-08 DIAGNOSIS — M069 Rheumatoid arthritis, unspecified: Secondary | ICD-10-CM | POA: Diagnosis not present

## 2012-06-08 DIAGNOSIS — Z48815 Encounter for surgical aftercare following surgery on the digestive system: Secondary | ICD-10-CM | POA: Diagnosis not present

## 2012-06-08 DIAGNOSIS — M159 Polyosteoarthritis, unspecified: Secondary | ICD-10-CM | POA: Diagnosis not present

## 2012-06-08 DIAGNOSIS — I10 Essential (primary) hypertension: Secondary | ICD-10-CM | POA: Diagnosis not present

## 2012-06-12 DIAGNOSIS — M159 Polyosteoarthritis, unspecified: Secondary | ICD-10-CM | POA: Diagnosis not present

## 2012-06-12 DIAGNOSIS — I10 Essential (primary) hypertension: Secondary | ICD-10-CM | POA: Diagnosis not present

## 2012-06-12 DIAGNOSIS — Z853 Personal history of malignant neoplasm of breast: Secondary | ICD-10-CM | POA: Diagnosis not present

## 2012-06-12 DIAGNOSIS — IMO0001 Reserved for inherently not codable concepts without codable children: Secondary | ICD-10-CM | POA: Diagnosis not present

## 2012-06-12 DIAGNOSIS — M069 Rheumatoid arthritis, unspecified: Secondary | ICD-10-CM | POA: Diagnosis not present

## 2012-06-12 DIAGNOSIS — Z48815 Encounter for surgical aftercare following surgery on the digestive system: Secondary | ICD-10-CM | POA: Diagnosis not present

## 2012-06-22 DIAGNOSIS — Z0000 Encounter for general adult medical examination without abnormal findings: Secondary | ICD-10-CM | POA: Diagnosis not present

## 2012-06-22 DIAGNOSIS — I10 Essential (primary) hypertension: Secondary | ICD-10-CM | POA: Diagnosis not present

## 2012-06-22 DIAGNOSIS — E782 Mixed hyperlipidemia: Secondary | ICD-10-CM | POA: Diagnosis not present

## 2012-06-22 DIAGNOSIS — E039 Hypothyroidism, unspecified: Secondary | ICD-10-CM | POA: Diagnosis not present

## 2012-06-22 DIAGNOSIS — D649 Anemia, unspecified: Secondary | ICD-10-CM | POA: Diagnosis not present

## 2012-06-26 DIAGNOSIS — M353 Polymyalgia rheumatica: Secondary | ICD-10-CM | POA: Diagnosis not present

## 2012-07-13 DIAGNOSIS — M159 Polyosteoarthritis, unspecified: Secondary | ICD-10-CM | POA: Diagnosis not present

## 2012-07-13 DIAGNOSIS — M316 Other giant cell arteritis: Secondary | ICD-10-CM | POA: Diagnosis not present

## 2012-07-18 DIAGNOSIS — R0602 Shortness of breath: Secondary | ICD-10-CM | POA: Diagnosis not present

## 2012-07-18 DIAGNOSIS — Z85038 Personal history of other malignant neoplasm of large intestine: Secondary | ICD-10-CM | POA: Diagnosis not present

## 2012-07-18 DIAGNOSIS — R5381 Other malaise: Secondary | ICD-10-CM | POA: Diagnosis not present

## 2012-07-18 DIAGNOSIS — E039 Hypothyroidism, unspecified: Secondary | ICD-10-CM | POA: Diagnosis not present

## 2012-07-18 DIAGNOSIS — D649 Anemia, unspecified: Secondary | ICD-10-CM | POA: Diagnosis not present

## 2012-07-18 DIAGNOSIS — I10 Essential (primary) hypertension: Secondary | ICD-10-CM | POA: Diagnosis not present

## 2012-07-18 DIAGNOSIS — K439 Ventral hernia without obstruction or gangrene: Secondary | ICD-10-CM | POA: Diagnosis not present

## 2012-07-18 DIAGNOSIS — R5383 Other fatigue: Secondary | ICD-10-CM | POA: Diagnosis not present

## 2012-07-18 DIAGNOSIS — M064 Inflammatory polyarthropathy: Secondary | ICD-10-CM | POA: Diagnosis not present

## 2012-07-31 DIAGNOSIS — M353 Polymyalgia rheumatica: Secondary | ICD-10-CM | POA: Diagnosis not present

## 2012-08-07 ENCOUNTER — Other Ambulatory Visit: Payer: Self-pay | Admitting: Rheumatology

## 2012-08-07 DIAGNOSIS — R700 Elevated erythrocyte sedimentation rate: Secondary | ICD-10-CM | POA: Diagnosis not present

## 2012-08-07 DIAGNOSIS — R509 Fever, unspecified: Secondary | ICD-10-CM | POA: Diagnosis not present

## 2012-08-07 DIAGNOSIS — M300 Polyarteritis nodosa: Secondary | ICD-10-CM | POA: Diagnosis not present

## 2012-08-07 DIAGNOSIS — R21 Rash and other nonspecific skin eruption: Secondary | ICD-10-CM | POA: Diagnosis not present

## 2012-08-07 DIAGNOSIS — M316 Other giant cell arteritis: Secondary | ICD-10-CM | POA: Diagnosis not present

## 2012-08-28 DIAGNOSIS — R700 Elevated erythrocyte sedimentation rate: Secondary | ICD-10-CM | POA: Diagnosis not present

## 2012-08-28 DIAGNOSIS — M316 Other giant cell arteritis: Secondary | ICD-10-CM | POA: Diagnosis not present

## 2012-08-28 DIAGNOSIS — M159 Polyosteoarthritis, unspecified: Secondary | ICD-10-CM | POA: Diagnosis not present

## 2012-09-21 DIAGNOSIS — M316 Other giant cell arteritis: Secondary | ICD-10-CM | POA: Diagnosis not present

## 2012-09-21 DIAGNOSIS — R700 Elevated erythrocyte sedimentation rate: Secondary | ICD-10-CM | POA: Diagnosis not present

## 2012-09-26 DIAGNOSIS — K432 Incisional hernia without obstruction or gangrene: Secondary | ICD-10-CM

## 2012-09-26 HISTORY — PX: COLONOSCOPY: SHX174

## 2012-09-26 HISTORY — DX: Incisional hernia without obstruction or gangrene: K43.2

## 2012-10-12 DIAGNOSIS — E039 Hypothyroidism, unspecified: Secondary | ICD-10-CM | POA: Diagnosis not present

## 2012-10-12 DIAGNOSIS — D649 Anemia, unspecified: Secondary | ICD-10-CM | POA: Diagnosis not present

## 2012-10-18 DIAGNOSIS — M25569 Pain in unspecified knee: Secondary | ICD-10-CM | POA: Diagnosis not present

## 2012-10-18 DIAGNOSIS — M159 Polyosteoarthritis, unspecified: Secondary | ICD-10-CM | POA: Diagnosis not present

## 2012-10-18 DIAGNOSIS — M353 Polymyalgia rheumatica: Secondary | ICD-10-CM | POA: Diagnosis not present

## 2012-10-29 DIAGNOSIS — I10 Essential (primary) hypertension: Secondary | ICD-10-CM | POA: Diagnosis not present

## 2012-10-29 DIAGNOSIS — D649 Anemia, unspecified: Secondary | ICD-10-CM | POA: Diagnosis not present

## 2012-10-29 DIAGNOSIS — E039 Hypothyroidism, unspecified: Secondary | ICD-10-CM | POA: Diagnosis not present

## 2012-10-29 DIAGNOSIS — M159 Polyosteoarthritis, unspecified: Secondary | ICD-10-CM | POA: Diagnosis not present

## 2012-10-29 DIAGNOSIS — E782 Mixed hyperlipidemia: Secondary | ICD-10-CM | POA: Diagnosis not present

## 2012-10-31 DIAGNOSIS — K439 Ventral hernia without obstruction or gangrene: Secondary | ICD-10-CM | POA: Diagnosis not present

## 2012-11-03 ENCOUNTER — Encounter: Payer: Self-pay | Admitting: *Deleted

## 2012-11-03 DIAGNOSIS — K639 Disease of intestine, unspecified: Secondary | ICD-10-CM | POA: Insufficient documentation

## 2012-11-03 DIAGNOSIS — I10 Essential (primary) hypertension: Secondary | ICD-10-CM | POA: Insufficient documentation

## 2012-11-03 DIAGNOSIS — M1990 Unspecified osteoarthritis, unspecified site: Secondary | ICD-10-CM | POA: Insufficient documentation

## 2012-11-03 DIAGNOSIS — E079 Disorder of thyroid, unspecified: Secondary | ICD-10-CM | POA: Insufficient documentation

## 2012-11-03 DIAGNOSIS — L80 Vitiligo: Secondary | ICD-10-CM | POA: Insufficient documentation

## 2012-11-03 DIAGNOSIS — E039 Hypothyroidism, unspecified: Secondary | ICD-10-CM | POA: Insufficient documentation

## 2012-11-06 ENCOUNTER — Ambulatory Visit: Payer: Self-pay | Admitting: Surgery

## 2012-11-06 DIAGNOSIS — K439 Ventral hernia without obstruction or gangrene: Secondary | ICD-10-CM | POA: Diagnosis not present

## 2012-11-06 DIAGNOSIS — Z9889 Other specified postprocedural states: Secondary | ICD-10-CM | POA: Diagnosis not present

## 2012-11-21 DIAGNOSIS — M79609 Pain in unspecified limb: Secondary | ICD-10-CM | POA: Diagnosis not present

## 2012-11-21 DIAGNOSIS — B351 Tinea unguium: Secondary | ICD-10-CM | POA: Diagnosis not present

## 2012-11-28 ENCOUNTER — Ambulatory Visit: Payer: Self-pay | Admitting: General Surgery

## 2012-11-28 DIAGNOSIS — Z853 Personal history of malignant neoplasm of breast: Secondary | ICD-10-CM | POA: Diagnosis not present

## 2012-11-28 DIAGNOSIS — R928 Other abnormal and inconclusive findings on diagnostic imaging of breast: Secondary | ICD-10-CM | POA: Diagnosis not present

## 2012-12-04 ENCOUNTER — Encounter: Payer: Self-pay | Admitting: *Deleted

## 2012-12-11 ENCOUNTER — Encounter: Payer: Self-pay | Admitting: *Deleted

## 2012-12-12 ENCOUNTER — Ambulatory Visit: Payer: Self-pay | Admitting: General Surgery

## 2012-12-24 ENCOUNTER — Encounter: Payer: Self-pay | Admitting: General Surgery

## 2012-12-24 ENCOUNTER — Ambulatory Visit (INDEPENDENT_AMBULATORY_CARE_PROVIDER_SITE_OTHER): Payer: Medicare Other | Admitting: General Surgery

## 2012-12-24 VITALS — BP 140/74 | HR 70 | Resp 16 | Ht 64.5 in | Wt 199.0 lb

## 2012-12-24 DIAGNOSIS — Z853 Personal history of malignant neoplasm of breast: Secondary | ICD-10-CM | POA: Diagnosis not present

## 2012-12-24 DIAGNOSIS — K432 Incisional hernia without obstruction or gangrene: Secondary | ICD-10-CM | POA: Diagnosis not present

## 2012-12-24 NOTE — Progress Notes (Signed)
Patient ID: Stacy [name], female   DOB: Nov 14, 1926, 79 y.o.   MRN: [id number]  Chief Complaint  Patient presents with  . Follow-up    mammogram follow up     HPI Stacy [name] is a 79 y.o. female.  The patient presents for a follow up mammogram. Most recent mammogram was done on 11/28/12 at [facility] with birad category 2. The patient has a history of breast cancer which was diagnosed in 2005 in the left breast. The patient underwent radiation and lumpectomy. She states she is doing well and no new problems.  Last yr shwe reportedly underwent repair of an incisional hernia -I was not aware of her admission for this. She thinks she has  Another one now-thinks she had a CT done recently, not sure exactly when. Also she has Polymyalgia nad has required Prednisone for control of pain. HPI  Past Medical History  Diagnosis Date  . Hypertension 1940  . Vitiligo 2012  . Asthma   . Arthritis 1940  . Thyroid disorder   . Mammographic microcalcification 2012    new finding in left breastat lumpectomy site- increased microcalcs. BIRADS 4  . Bowel trouble     Past Surgical History  Procedure Laterality Date  . Breast surgery Left 2006    lumpectomy  . Cholecystectomy  1969  . Colon surgery  2004  . Eye surgery  2002  . Foot surgery  1996  . Left breast biopsy   2011  . Colonoscopy  2004    Dr. Maryruth Bun  . Hernia repair  2013    History reviewed. No pertinent family history.  Social History History  Substance Use Topics  . Smoking status: Never Smoker   . Smokeless tobacco: Never Used  . Alcohol Use: No    Allergies  Allergen Reactions  . Aspirin Itching  . Codeine Other (See Comments)    dizziness  . Tape Itching  . Zithromax (Azithromycin) Rash    Current Outpatient Prescriptions  Medication Sig Dispense Refill  . amLODipine (NORVASC) 2.5 MG tablet Take 2.5 mg by mouth daily.      . Calcium 500 MG tablet Take 600 mg by mouth daily.      . Cyanocobalamin (VITAMIN B 12 PO) Take 500  mcg by mouth daily.      Marland Kitchen MAGNESIUM-ZINC PO Take 1 tablet by mouth daily.      . predniSONE (DELTASONE) 5 MG tablet Take 2.5 mg by mouth daily.        No current facility-administered medications for this visit.    Review of Systems Review of Systems  Constitutional: Negative.   Respiratory: Negative.   Cardiovascular: Negative.     Blood pressure 140/74, pulse 70, resp. rate 16, height 5' 4.5" (1.638 m), weight 199 lb (90.266 kg).  Physical Exam Physical Exam  Constitutional: She is oriented to person, place, and time. She appears well-developed and well-nourished.  Eyes: Conjunctivae are normal. No scleral icterus.  Neck: Trachea normal. No mass and no thyromegaly present.  Cardiovascular: Normal rate, regular rhythm, normal heart sounds and normal pulses.   No murmur heard. Pulses:      Dorsalis pedis pulses are 2+ on the right side, and 2+ on the left side.       Posterior tibial pulses are 2+ on the right side, and 2+ on the left side.  No leg edema, no VV  Pulmonary/Chest: Effort normal and breath sounds normal. Right breast exhibits no inverted nipple, no mass, no  nipple discharge, no skin change and no tenderness. Left breast exhibits no inverted nipple, no mass, no nipple discharge, no skin change and no tenderness.    Well healed incision scars on left breast.   Abdominal: Soft. Normal appearance and bowel sounds are normal. There is no tenderness. A hernia is present.    Lymphadenopathy:    She has no cervical adenopathy.    She has no axillary adenopathy.  Neurological: She is alert and oriented to person, place, and time.  Skin: Skin is warm and dry.    Data Reviewed Mammogram reviewed-BIRADS 2.   Assessment    Breast exam is stable. She has a new incisional hernia in lower abdomen     Plan    Plan for 1 yr f/u with mammogram.        Greta Doom F 12/24/2012, 3:36 PM

## 2012-12-24 NOTE — Patient Instructions (Addendum)
Patient instructed to sign a release of information in regards to previous hernia repair. Will review information and will advise patient accordingly. Patient to return in 1 year with bilateral diagnostic mammogram.

## 2012-12-26 ENCOUNTER — Telehealth: Payer: Self-pay | Admitting: *Deleted

## 2012-12-26 NOTE — Telephone Encounter (Signed)
Patient called back and is aware of instructions. She has been scheduled for an appointment on 01-07-13. Thanks.

## 2012-12-26 NOTE — Telephone Encounter (Signed)
I have called patient on home and cell numbers with no answer and could not leave a message. Patient needs to be informed that Dr. Evette Cristal received records from Dr. [name] and she needs an appointment here to further discuss hernia.

## 2013-01-07 ENCOUNTER — Encounter: Payer: Self-pay | Admitting: General Surgery

## 2013-01-07 ENCOUNTER — Ambulatory Visit (INDEPENDENT_AMBULATORY_CARE_PROVIDER_SITE_OTHER): Payer: Medicare Other | Admitting: General Surgery

## 2013-01-07 VITALS — BP 118/78 | HR 74 | Resp 16 | Ht 65.0 in | Wt 197.0 lb

## 2013-01-07 DIAGNOSIS — K432 Incisional hernia without obstruction or gangrene: Secondary | ICD-10-CM

## 2013-01-07 NOTE — Patient Instructions (Addendum)
Hernia precautions and incarceration were discussed with the patient. If they develop symptoms of an incarcerated hernia, they were encouraged to seek prompt medical attention.  I have recommended repair of the hernia using mesh on an outpatient basis in the near future. The risk of infection was reviewed. The role of prosthetic mesh to minimize the risk of recurrence was reviewed.

## 2013-01-07 NOTE — Progress Notes (Signed)
Patient ID: Stacy [name], female   DOB: [birth year], 79 y.o.   MRN: [id number]  Chief Complaint  Patient presents with  . Incisional Hernia    HPI Stacy [name] is a 79 y.o. female here today discuss incisional hernia. HPI  Past Medical History  Diagnosis Date  . Hypertension 1940  . Vitiligo 2012  . Asthma   . Arthritis 1940  . Thyroid disorder   . Mammographic microcalcification 2012    new finding in left breastat lumpectomy site- increased microcalcs. BIRADS 4  . Bowel trouble   . Incisional hernia 2014    Past Surgical History  Procedure Laterality Date  . Breast surgery Left 2006    lumpectomy  . Cholecystectomy  1969  . Colon surgery  2004  . Eye surgery  2002  . Foot surgery  1996  . Left breast biopsy   2011  . Colonoscopy  2004    Dr. Maryruth Bun  . Hernia repair  2013    History reviewed. No pertinent family history.  Social History History  Substance Use Topics  . Smoking status: Never Smoker   . Smokeless tobacco: Never Used  . Alcohol Use: No    Allergies  Allergen Reactions  . Aspirin Itching  . Codeine Other (See Comments)    dizziness  . Tape Itching  . Zithromax (Azithromycin) Rash    Current Outpatient Prescriptions  Medication Sig Dispense Refill  . amLODipine (NORVASC) 2.5 MG tablet Take 2.5 mg by mouth daily.      . Calcium 500 MG tablet Take 600 mg by mouth daily.      . Cyanocobalamin (VITAMIN B 12 PO) Take 500 mcg by mouth daily.      Marland Kitchen MAGNESIUM-ZINC PO Take 1 tablet by mouth daily.      . predniSONE (DELTASONE) 5 MG tablet Take 2.5 mg by mouth daily.        No current facility-administered medications for this visit.    Review of Systems Review of Systems  Constitutional: Negative.   Respiratory: Positive for shortness of breath. Negative for apnea, cough, choking, chest tightness, wheezing and stridor.   Cardiovascular: Positive for leg swelling. Negative for chest pain and palpitations.  Gastrointestinal: Negative.  Negative for  nausea, vomiting, abdominal pain, diarrhea, constipation, blood in stool, abdominal distention, anal bleeding and rectal pain.    Blood pressure 118/78, pulse 74, resp. rate 16, height 5\' 5"  (1.651 m), weight 197 lb (89.359 kg).  Physical Exam Physical ExamNot performed. Pt here for discussion only.  Data Reviewed CT scan from Feb 2014 and prior hernia repair records reviewed.  Assessment    Incisional hernia     Plan    Elective repair. Discussed fully with pt. She is now on Prednisone 2.5 mg daily and expected to come off soon. Will discuss with Dr. Lavenia Atlas, rheumatologist following Stacy [name]. Pt advised on nature of repair, risks and benefits. She is agreeable.        SANKAR,SEEPLAPUTHUR G 01/08/2013, 11:33 AM

## 2013-01-08 ENCOUNTER — Encounter: Payer: Self-pay | Admitting: General Surgery

## 2013-01-25 DIAGNOSIS — D729 Disorder of white blood cells, unspecified: Secondary | ICD-10-CM | POA: Diagnosis not present

## 2013-01-25 DIAGNOSIS — J209 Acute bronchitis, unspecified: Secondary | ICD-10-CM | POA: Diagnosis not present

## 2013-01-25 DIAGNOSIS — J019 Acute sinusitis, unspecified: Secondary | ICD-10-CM | POA: Diagnosis not present

## 2013-01-25 DIAGNOSIS — E039 Hypothyroidism, unspecified: Secondary | ICD-10-CM | POA: Diagnosis not present

## 2013-02-06 DIAGNOSIS — R1084 Generalized abdominal pain: Secondary | ICD-10-CM | POA: Diagnosis not present

## 2013-02-06 DIAGNOSIS — Z86010 Personal history of colonic polyps: Secondary | ICD-10-CM | POA: Diagnosis not present

## 2013-02-08 DIAGNOSIS — I10 Essential (primary) hypertension: Secondary | ICD-10-CM | POA: Diagnosis not present

## 2013-02-08 DIAGNOSIS — E782 Mixed hyperlipidemia: Secondary | ICD-10-CM | POA: Diagnosis not present

## 2013-02-08 DIAGNOSIS — Z006 Encounter for examination for normal comparison and control in clinical research program: Secondary | ICD-10-CM | POA: Diagnosis not present

## 2013-02-08 DIAGNOSIS — D649 Anemia, unspecified: Secondary | ICD-10-CM | POA: Diagnosis not present

## 2013-02-08 DIAGNOSIS — E039 Hypothyroidism, unspecified: Secondary | ICD-10-CM | POA: Diagnosis not present

## 2013-02-08 DIAGNOSIS — M159 Polyosteoarthritis, unspecified: Secondary | ICD-10-CM | POA: Diagnosis not present

## 2013-02-08 DIAGNOSIS — J45909 Unspecified asthma, uncomplicated: Secondary | ICD-10-CM | POA: Diagnosis not present

## 2013-02-12 DIAGNOSIS — M353 Polymyalgia rheumatica: Secondary | ICD-10-CM | POA: Diagnosis not present

## 2013-02-20 ENCOUNTER — Ambulatory Visit: Payer: Self-pay | Admitting: Gastroenterology

## 2013-02-20 DIAGNOSIS — M19049 Primary osteoarthritis, unspecified hand: Secondary | ICD-10-CM | POA: Diagnosis not present

## 2013-02-20 DIAGNOSIS — Z09 Encounter for follow-up examination after completed treatment for conditions other than malignant neoplasm: Secondary | ICD-10-CM | POA: Diagnosis not present

## 2013-02-20 DIAGNOSIS — Z860100 Personal history of colon polyps, unspecified: Secondary | ICD-10-CM | POA: Diagnosis not present

## 2013-02-20 DIAGNOSIS — Z79899 Other long term (current) drug therapy: Secondary | ICD-10-CM | POA: Diagnosis not present

## 2013-02-20 DIAGNOSIS — Z853 Personal history of malignant neoplasm of breast: Secondary | ICD-10-CM | POA: Diagnosis not present

## 2013-02-20 DIAGNOSIS — Z881 Allergy status to other antibiotic agents status: Secondary | ICD-10-CM | POA: Diagnosis not present

## 2013-02-20 DIAGNOSIS — Z885 Allergy status to narcotic agent status: Secondary | ICD-10-CM | POA: Diagnosis not present

## 2013-02-20 DIAGNOSIS — E039 Hypothyroidism, unspecified: Secondary | ICD-10-CM | POA: Diagnosis not present

## 2013-02-20 DIAGNOSIS — E669 Obesity, unspecified: Secondary | ICD-10-CM | POA: Diagnosis not present

## 2013-02-20 DIAGNOSIS — M19019 Primary osteoarthritis, unspecified shoulder: Secondary | ICD-10-CM | POA: Diagnosis not present

## 2013-02-20 DIAGNOSIS — Z6833 Body mass index (BMI) 33.0-33.9, adult: Secondary | ICD-10-CM | POA: Diagnosis not present

## 2013-02-20 DIAGNOSIS — Z886 Allergy status to analgesic agent status: Secondary | ICD-10-CM | POA: Diagnosis not present

## 2013-02-20 DIAGNOSIS — M47812 Spondylosis without myelopathy or radiculopathy, cervical region: Secondary | ICD-10-CM | POA: Diagnosis not present

## 2013-02-20 DIAGNOSIS — I10 Essential (primary) hypertension: Secondary | ICD-10-CM | POA: Diagnosis not present

## 2013-02-20 DIAGNOSIS — Z888 Allergy status to other drugs, medicaments and biological substances status: Secondary | ICD-10-CM | POA: Diagnosis not present

## 2013-02-22 ENCOUNTER — Other Ambulatory Visit: Payer: Self-pay | Admitting: *Deleted

## 2013-03-04 ENCOUNTER — Ambulatory Visit (INDEPENDENT_AMBULATORY_CARE_PROVIDER_SITE_OTHER): Payer: Medicare Other | Admitting: General Surgery

## 2013-03-04 ENCOUNTER — Encounter: Payer: Self-pay | Admitting: General Surgery

## 2013-03-04 VITALS — BP 124/84 | HR 82 | Resp 16 | Ht 64.0 in | Wt 197.0 lb

## 2013-03-04 DIAGNOSIS — K432 Incisional hernia without obstruction or gangrene: Secondary | ICD-10-CM | POA: Diagnosis not present

## 2013-03-04 DIAGNOSIS — Z853 Personal history of malignant neoplasm of breast: Secondary | ICD-10-CM | POA: Diagnosis not present

## 2013-03-04 NOTE — Progress Notes (Signed)
Patient ID: Stacy [name], female   DOB: [birth year], 79 y.o.   MRN: [id number]  No chief complaint on file.   HPI Stacy [name] is a 79 y.o. female here to follow up on her incisional hernia. She is now ready to have surgery. Pt had her f/u with Dr. Saverio Danker and she is now on alt days of prednisone 2.5 mg  HPI  Past Medical History  Diagnosis Date  . Hypertension 1940  . Vitiligo 2012  . Asthma   . Arthritis 1940  . Thyroid disorder   . Mammographic microcalcification 2012    new finding in left breastat lumpectomy site- increased microcalcs. BIRADS 4  . Bowel trouble   . Incisional hernia 2014    Past Surgical History  Procedure Laterality Date  . Breast surgery Left 2006    lumpectomy  . Cholecystectomy  1969  . Colon surgery  2004  . Eye surgery  2002  . Foot surgery  1996  . Left breast biopsy   2011  . Colonoscopy  2014    Dr. Bluford Kaufmann  . Hernia repair  2013    History reviewed. No pertinent family history.  Social History History  Substance Use Topics  . Smoking status: Never Smoker   . Smokeless tobacco: Never Used  . Alcohol Use: No    Allergies  Allergen Reactions  . Aspirin Itching  . Codeine Other (See Comments)    dizziness  . Tape Itching  . Zithromax (Azithromycin) Rash    Current Outpatient Prescriptions  Medication Sig Dispense Refill  . amLODipine (NORVASC) 2.5 MG tablet Take 2.5 mg by mouth daily.      . Calcium 500 MG tablet Take 600 mg by mouth daily.      . Cyanocobalamin (VITAMIN B 12 PO) Take 500 mcg by mouth daily.      Marland Kitchen MAGNESIUM-ZINC PO Take 1 tablet by mouth daily.      . predniSONE (DELTASONE) 5 MG tablet Take 2.5 mg by mouth every other day.        No current facility-administered medications for this visit.    Review of Systems Review of Systems  Constitutional: Negative.   Respiratory: Negative.   Cardiovascular: Negative.     Blood pressure 124/84, pulse 82, resp. rate 16, height 5\' 4"  (1.626 m), weight 197 lb  (89.359 kg).  Physical Exam Physical Exam  Constitutional: She appears well-developed and well-nourished.  Neck: No mass and no thyromegaly present.  Cardiovascular: Normal rate, regular rhythm and normal heart sounds.   Pulmonary/Chest: Breath sounds normal.  Abdominal: Soft. Normal appearance. There is no hepatomegaly. A hernia is present.      Data Reviewed None   Assessment          Plan         Patient to have hernia surgery. This has been arranged at [facility] for 03-19-13. Discussed attempt laparoscopy, possible open repair. Risks, benefits discussed.     SANKAR,SEEPLAPUTHUR G 03/04/2013, 1:54 PM

## 2013-03-04 NOTE — Patient Instructions (Addendum)
Patient to have hernia surgery. This has been arranged at [facility] for 03-19-13.  Hernia precautions and incarceration were discussed with the patient. If they develop symptoms of an incarcerated hernia, they were encouraged to seek prompt medical attention. I have recommended repair of the hernia using mesh on an outpatient basis in the near future. The risk of infection was reviewed. The role of prosthetic mesh to minimize the risk of recurrence was reviewed.

## 2013-03-06 ENCOUNTER — Ambulatory Visit: Payer: Self-pay | Admitting: General Surgery

## 2013-03-06 DIAGNOSIS — Z853 Personal history of malignant neoplasm of breast: Secondary | ICD-10-CM | POA: Diagnosis not present

## 2013-03-06 DIAGNOSIS — K432 Incisional hernia without obstruction or gangrene: Secondary | ICD-10-CM | POA: Diagnosis not present

## 2013-03-06 DIAGNOSIS — Z01812 Encounter for preprocedural laboratory examination: Secondary | ICD-10-CM | POA: Diagnosis not present

## 2013-03-06 LAB — CBC WITH DIFFERENTIAL/PLATELET
Basophil #: 0.1 10*3/uL (ref 0.0–0.1)
Basophil %: 0.8 %
Eosinophil #: 0.5 10*3/uL (ref 0.0–0.7)
Eosinophil %: 5.5 %
HGB: 11.8 g/dL — ABNORMAL LOW (ref 12.0–16.0)
Lymphocyte #: 2.3 10*3/uL (ref 1.0–3.6)
Lymphocyte %: 24.1 %
MCV: 84 fL (ref 80–100)
Monocyte #: 0.9 x10 3/mm (ref 0.2–0.9)
Monocyte %: 9.1 %
Neutrophil #: 5.9 10*3/uL (ref 1.4–6.5)
Neutrophil %: 60.5 %
Platelet: 249 10*3/uL (ref 150–440)
WBC: 9.7 10*3/uL (ref 3.6–11.0)

## 2013-03-06 LAB — BASIC METABOLIC PANEL
Anion Gap: 6 — ABNORMAL LOW (ref 7–16)
BUN: 18 mg/dL (ref 7–18)
Calcium, Total: 9.5 mg/dL (ref 8.5–10.1)
Chloride: 103 mmol/L (ref 98–107)
EGFR (African American): >60
EGFR (Non-African Amer.): >60
Glucose: 90 mg/dL (ref 65–99)

## 2013-03-07 ENCOUNTER — Encounter: Payer: Self-pay | Admitting: General Surgery

## 2013-03-11 DIAGNOSIS — M79609 Pain in unspecified limb: Secondary | ICD-10-CM | POA: Diagnosis not present

## 2013-03-11 DIAGNOSIS — B351 Tinea unguium: Secondary | ICD-10-CM | POA: Diagnosis not present

## 2013-03-13 DIAGNOSIS — M353 Polymyalgia rheumatica: Secondary | ICD-10-CM | POA: Diagnosis not present

## 2013-03-19 ENCOUNTER — Ambulatory Visit: Payer: Self-pay | Admitting: General Surgery

## 2013-03-19 DIAGNOSIS — Z886 Allergy status to analgesic agent status: Secondary | ICD-10-CM | POA: Diagnosis not present

## 2013-03-19 DIAGNOSIS — Z5331 Laparoscopic surgical procedure converted to open procedure: Secondary | ICD-10-CM | POA: Diagnosis not present

## 2013-03-19 DIAGNOSIS — Z9109 Other allergy status, other than to drugs and biological substances: Secondary | ICD-10-CM | POA: Diagnosis not present

## 2013-03-19 DIAGNOSIS — K439 Ventral hernia without obstruction or gangrene: Secondary | ICD-10-CM | POA: Diagnosis not present

## 2013-03-19 DIAGNOSIS — Z853 Personal history of malignant neoplasm of breast: Secondary | ICD-10-CM | POA: Diagnosis not present

## 2013-03-19 DIAGNOSIS — M353 Polymyalgia rheumatica: Secondary | ICD-10-CM | POA: Diagnosis not present

## 2013-03-19 DIAGNOSIS — Z9089 Acquired absence of other organs: Secondary | ICD-10-CM | POA: Diagnosis not present

## 2013-03-19 DIAGNOSIS — K660 Peritoneal adhesions (postprocedural) (postinfection): Secondary | ICD-10-CM | POA: Diagnosis not present

## 2013-03-19 DIAGNOSIS — Z885 Allergy status to narcotic agent status: Secondary | ICD-10-CM | POA: Diagnosis not present

## 2013-03-19 DIAGNOSIS — I10 Essential (primary) hypertension: Secondary | ICD-10-CM | POA: Diagnosis not present

## 2013-03-19 DIAGNOSIS — R110 Nausea: Secondary | ICD-10-CM | POA: Diagnosis not present

## 2013-03-19 DIAGNOSIS — K432 Incisional hernia without obstruction or gangrene: Secondary | ICD-10-CM

## 2013-03-19 DIAGNOSIS — M129 Arthropathy, unspecified: Secondary | ICD-10-CM | POA: Diagnosis not present

## 2013-03-20 ENCOUNTER — Encounter: Payer: Self-pay | Admitting: General Surgery

## 2013-03-20 DIAGNOSIS — K660 Peritoneal adhesions (postprocedural) (postinfection): Secondary | ICD-10-CM | POA: Diagnosis not present

## 2013-03-20 DIAGNOSIS — I10 Essential (primary) hypertension: Secondary | ICD-10-CM | POA: Diagnosis not present

## 2013-03-20 DIAGNOSIS — M353 Polymyalgia rheumatica: Secondary | ICD-10-CM | POA: Diagnosis not present

## 2013-03-20 DIAGNOSIS — R110 Nausea: Secondary | ICD-10-CM | POA: Diagnosis not present

## 2013-03-20 DIAGNOSIS — K432 Incisional hernia without obstruction or gangrene: Secondary | ICD-10-CM | POA: Diagnosis not present

## 2013-03-20 DIAGNOSIS — Z5331 Laparoscopic surgical procedure converted to open procedure: Secondary | ICD-10-CM | POA: Diagnosis not present

## 2013-03-28 ENCOUNTER — Ambulatory Visit (INDEPENDENT_AMBULATORY_CARE_PROVIDER_SITE_OTHER): Payer: Medicare Other | Admitting: General Surgery

## 2013-03-28 ENCOUNTER — Encounter: Payer: Self-pay | Admitting: General Surgery

## 2013-03-28 VITALS — BP 142/78 | HR 84 | Resp 16 | Ht 64.5 in | Wt 196.0 lb

## 2013-03-28 DIAGNOSIS — K432 Incisional hernia without obstruction or gangrene: Secondary | ICD-10-CM

## 2013-03-28 NOTE — Patient Instructions (Addendum)
Follow up 2 weeks Resume activites as tolerated, no heavy lifting, no exertional activity until next visit

## 2013-03-28 NOTE — Progress Notes (Signed)
Patient ID: Stacy [name], female   DOB: Feb 18, 1927, 79 y.o.   MRN: [id number] Patient here today fro postop visit.  She had hernia surgery 03-19-13 and is getting along well, still a little sore.  Incision clean and small seroma noted at upper end of incision. Abdomen soft, normal bowel sounds.  Staples removed and steri strips applied.

## 2013-04-08 ENCOUNTER — Telehealth: Payer: Self-pay | Admitting: *Deleted

## 2013-04-08 ENCOUNTER — Ambulatory Visit (INDEPENDENT_AMBULATORY_CARE_PROVIDER_SITE_OTHER): Payer: Medicare Other | Admitting: *Deleted

## 2013-04-08 DIAGNOSIS — K432 Incisional hernia without obstruction or gangrene: Secondary | ICD-10-CM

## 2013-04-08 NOTE — Progress Notes (Signed)
Staples removed from left abdomen surgical lap site. Some redness noted, minimal discomfort with staple removal. Antibiotic ointment and bandaid applied for pt comfort. Pt advised to call if any signs of infection noted, drainage, pain inflammation, or any questions/concerns.  She verbalized her understanding. L.Alonna Buckler RN

## 2013-04-08 NOTE — Telephone Encounter (Signed)
I called and spoke to pt at home regarding her concerns about 2 staples from her hernia surgery that are red. All other staples were removed on 03/28/13. Pt instructed to come to office today and she will arrive by 3:00p.m. L.Feeney RN

## 2013-04-08 NOTE — Telephone Encounter (Signed)
Message copied by Levada Schilling on Mon Apr 08, 2013 11:39 AM ------      Message from: Erich Montane      Created: Mon Apr 08, 2013  9:22 AM      Regarding: Robbye Dede      Contact: [phone]       Stacy [name] is experiencing redness around her staples has an appointment 04/15/2013 but wants to know if she needs to come in sooner.  ------

## 2013-04-12 DIAGNOSIS — M353 Polymyalgia rheumatica: Secondary | ICD-10-CM | POA: Diagnosis not present

## 2013-04-15 ENCOUNTER — Encounter: Payer: Self-pay | Admitting: General Surgery

## 2013-04-15 ENCOUNTER — Ambulatory Visit (INDEPENDENT_AMBULATORY_CARE_PROVIDER_SITE_OTHER): Payer: Medicare Other | Admitting: General Surgery

## 2013-04-15 VITALS — BP 150/82 | HR 76 | Resp 14 | Ht 64.0 in | Wt 192.0 lb

## 2013-04-15 DIAGNOSIS — K432 Incisional hernia without obstruction or gangrene: Secondary | ICD-10-CM

## 2013-04-15 DIAGNOSIS — Z853 Personal history of malignant neoplasm of breast: Secondary | ICD-10-CM

## 2013-04-15 NOTE — Progress Notes (Signed)
Patient ID: Stacy [name], female   DOB: 03-Dec-1926, 79 y.o.   MRN: [id number] Here for 2 week post op incisional hernia repair. Patient reports some discomfort but over all doing very well.  Abdominal incision and port site are well healed. No recurrence of hernia. Abdomen is soft with good bowel sounds  Impression: recovered well from surgery. She is having more pain in her joints. If she needs to increase her steroid dose it is acceptable at this time.

## 2013-04-15 NOTE — Patient Instructions (Addendum)
Follow up in march with scheduled mammogram. Increase activity as tolerated.

## 2013-04-15 NOTE — Progress Notes (Deleted)
Patient ID: Stacy [name], female   DOB: [birth year], 79 y.o.   MRN: [id number]  No chief complaint on file.   HPI Stacy [name] is a 79 y.o. female.  *** HPI  Past Medical History  Diagnosis Date  . Hypertension 1940  . Vitiligo 2012  . Asthma   . Arthritis 1940  . Thyroid disorder   . Mammographic microcalcification 2012    new finding in left breastat lumpectomy site- increased microcalcs. BIRADS 4  . Bowel trouble   . Incisional hernia 2014    Past Surgical History  Procedure Laterality Date  . Breast surgery Left 2006    lumpectomy  . Cholecystectomy  1969  . Colon surgery  2004  . Eye surgery  2002  . Foot surgery  1996  . Left breast biopsy   2011  . Colonoscopy  2014    Dr. Bluford Kaufmann  . Hernia repair  2013    No family history on file.  Social History History  Substance Use Topics  . Smoking status: Never Smoker   . Smokeless tobacco: Never Used  . Alcohol Use: No    Allergies  Allergen Reactions  . Aspirin Itching  . Codeine Other (See Comments)    dizziness  . Tape Itching  . Zithromax (Azithromycin) Rash    Current Outpatient Prescriptions  Medication Sig Dispense Refill  . acetaminophen (TYLENOL) 325 MG tablet Take 650 mg by mouth every 6 (six) hours as needed for pain.      Marland Kitchen amLODipine (NORVASC) 2.5 MG tablet Take 2.5 mg by mouth daily.      . Calcium 500 MG tablet Take 600 mg by mouth daily.      . Cyanocobalamin (VITAMIN B 12 PO) Take 500 mcg by mouth daily.      Marland Kitchen MAGNESIUM-ZINC PO Take 1 tablet by mouth daily.      . predniSONE (DELTASONE) 5 MG tablet Take 2.5 mg by mouth every other day.        No current facility-administered medications for this visit.    Review of Systems Review of Systems  Blood pressure 150/82, pulse 76, resp. rate 14, height 5\' 4"  (1.626 m), weight 192 lb (87.091 kg).  Physical Exam Physical Exam  Data Reviewed ***  Assessment    ***    Plan    ***       Sinda Du 04/15/2013, 1:59  PM

## 2013-04-16 ENCOUNTER — Encounter: Payer: Self-pay | Admitting: General Surgery

## 2013-05-07 DIAGNOSIS — M353 Polymyalgia rheumatica: Secondary | ICD-10-CM | POA: Diagnosis not present

## 2013-06-10 DIAGNOSIS — Z79899 Other long term (current) drug therapy: Secondary | ICD-10-CM | POA: Diagnosis not present

## 2013-06-10 DIAGNOSIS — M545 Low back pain, unspecified: Secondary | ICD-10-CM | POA: Diagnosis not present

## 2013-06-10 DIAGNOSIS — I10 Essential (primary) hypertension: Secondary | ICD-10-CM | POA: Diagnosis not present

## 2013-06-10 DIAGNOSIS — M79609 Pain in unspecified limb: Secondary | ICD-10-CM | POA: Diagnosis not present

## 2013-06-10 DIAGNOSIS — K439 Ventral hernia without obstruction or gangrene: Secondary | ICD-10-CM | POA: Diagnosis not present

## 2013-06-10 DIAGNOSIS — M064 Inflammatory polyarthropathy: Secondary | ICD-10-CM | POA: Diagnosis not present

## 2013-06-10 DIAGNOSIS — M353 Polymyalgia rheumatica: Secondary | ICD-10-CM | POA: Diagnosis not present

## 2013-06-10 DIAGNOSIS — B351 Tinea unguium: Secondary | ICD-10-CM | POA: Diagnosis not present

## 2013-10-01 DIAGNOSIS — M353 Polymyalgia rheumatica: Secondary | ICD-10-CM | POA: Diagnosis not present

## 2013-10-01 DIAGNOSIS — J309 Allergic rhinitis, unspecified: Secondary | ICD-10-CM | POA: Diagnosis not present

## 2013-10-01 DIAGNOSIS — B9789 Other viral agents as the cause of diseases classified elsewhere: Secondary | ICD-10-CM | POA: Diagnosis not present

## 2013-10-07 DIAGNOSIS — E039 Hypothyroidism, unspecified: Secondary | ICD-10-CM | POA: Diagnosis not present

## 2013-10-07 DIAGNOSIS — B351 Tinea unguium: Secondary | ICD-10-CM | POA: Diagnosis not present

## 2013-10-07 DIAGNOSIS — M79609 Pain in unspecified limb: Secondary | ICD-10-CM | POA: Diagnosis not present

## 2013-10-07 DIAGNOSIS — Z79899 Other long term (current) drug therapy: Secondary | ICD-10-CM | POA: Diagnosis not present

## 2013-10-07 DIAGNOSIS — I10 Essential (primary) hypertension: Secondary | ICD-10-CM | POA: Diagnosis not present

## 2013-10-08 ENCOUNTER — Encounter: Payer: Self-pay | Admitting: General Surgery

## 2013-10-08 DIAGNOSIS — M353 Polymyalgia rheumatica: Secondary | ICD-10-CM | POA: Diagnosis not present

## 2013-10-08 DIAGNOSIS — R7982 Elevated C-reactive protein (CRP): Secondary | ICD-10-CM | POA: Diagnosis not present

## 2013-10-08 DIAGNOSIS — M25519 Pain in unspecified shoulder: Secondary | ICD-10-CM | POA: Diagnosis not present

## 2013-10-08 DIAGNOSIS — M542 Cervicalgia: Secondary | ICD-10-CM | POA: Diagnosis not present

## 2013-10-09 DIAGNOSIS — E119 Type 2 diabetes mellitus without complications: Secondary | ICD-10-CM | POA: Diagnosis not present

## 2013-10-09 DIAGNOSIS — J209 Acute bronchitis, unspecified: Secondary | ICD-10-CM | POA: Diagnosis not present

## 2013-11-11 DIAGNOSIS — R7982 Elevated C-reactive protein (CRP): Secondary | ICD-10-CM | POA: Diagnosis not present

## 2013-12-09 ENCOUNTER — Ambulatory Visit: Payer: Self-pay | Admitting: General Surgery

## 2013-12-09 ENCOUNTER — Ambulatory Visit: Payer: Medicare Other | Admitting: General Surgery

## 2013-12-09 DIAGNOSIS — Z1231 Encounter for screening mammogram for malignant neoplasm of breast: Secondary | ICD-10-CM | POA: Diagnosis not present

## 2013-12-10 DIAGNOSIS — R7982 Elevated C-reactive protein (CRP): Secondary | ICD-10-CM | POA: Diagnosis not present

## 2013-12-11 ENCOUNTER — Encounter: Payer: Self-pay | Admitting: General Surgery

## 2013-12-17 ENCOUNTER — Ambulatory Visit (INDEPENDENT_AMBULATORY_CARE_PROVIDER_SITE_OTHER): Payer: Medicare Other | Admitting: General Surgery

## 2013-12-17 ENCOUNTER — Encounter: Payer: Self-pay | Admitting: General Surgery

## 2013-12-17 VITALS — BP 154/74 | HR 82 | Resp 12 | Ht 64.5 in | Wt 188.0 lb

## 2013-12-17 DIAGNOSIS — Z853 Personal history of malignant neoplasm of breast: Secondary | ICD-10-CM | POA: Diagnosis not present

## 2013-12-17 NOTE — Patient Instructions (Signed)
Pt to return in 1 year for bilaterally screening mammogram.  Continue self breast exams. Call office for any new breast issues or concerns.

## 2013-12-17 NOTE — Progress Notes (Signed)
Patient ID: Stacy [name], female   DOB: 05/27/1927, 80 y.o.   MRN: [id number]  Chief Complaint  Patient presents with  . Follow-up    1 year follow up bilateral diagnostic mammogram. History of Breast cancer    HPI Stacy [name] is a 80 y.o. female who presents for a breast evaluation. The most recent mammogram was done on 12/09/13. Patient does perform regular self breast checks and gets regular mammograms done.  The patient has a history of left breast cancer diagnosed in 2006. She underwent a left lumpectomy at that time. She states she thinks a spider bit her 2-3 months ago and it has left a black spot on her right breast. No change in size.    HPI  Past Medical History  Diagnosis Date  . Hypertension 1940  . Vitiligo 2012  . Asthma   . Arthritis 1940  . Thyroid disorder   . Mammographic microcalcification 2012    new finding in left breastat lumpectomy site- increased microcalcs. BIRADS 4  . Bowel trouble   . Incisional hernia 2014    Past Surgical History  Procedure Laterality Date  . Breast surgery Left 2006    lumpectomy  . Cholecystectomy  1969  . Colon surgery  2004  . Eye surgery  2002  . Foot surgery  1996  . Left breast biopsy   2011  . Colonoscopy  2014    Dr. Candace Cruise  . Hernia repair  2013    History reviewed. No pertinent family history.  Social History History  Substance Use Topics  . Smoking status: Never Smoker   . Smokeless tobacco: Never Used  . Alcohol Use: No    Allergies  Allergen Reactions  . Aspirin Itching  . Codeine Other (See Comments)    dizziness  . Tape Itching  . Zithromax [Azithromycin] Rash    Current Outpatient Prescriptions  Medication Sig Dispense Refill  . acetaminophen (TYLENOL) 325 MG tablet Take 650 mg by mouth every 6 (six) hours as needed for pain.      Marland Kitchen amLODipine (NORVASC) 2.5 MG tablet Take 2.5 mg by mouth daily.      . Flaxseed, Linseed, (FLAXSEED OIL PO) Take 1 capsule by mouth daily.      . predniSONE (DELTASONE)  5 MG tablet Take 2.5 mg by mouth daily.        No current facility-administered medications for this visit.    Review of Systems Review of Systems  Constitutional: Negative.   Respiratory: Negative.   Cardiovascular: Negative.     Blood pressure 154/74, pulse 82, resp. rate 12, height 5' 4.5" (1.638 m), weight 188 lb (85.276 kg).  Physical Exam Physical Exam  Constitutional: She is oriented to person, place, and time. She appears well-developed and well-nourished.  Eyes: Conjunctivae are normal. No scleral icterus.  Neck: Neck supple. No thyromegaly present.  Cardiovascular: Normal rate, regular rhythm and normal heart sounds.   Pulmonary/Chest: Effort normal and breath sounds normal. Right breast exhibits no inverted nipple, no mass, no nipple discharge, no skin change and no tenderness. Left breast exhibits no inverted nipple, no mass, no nipple discharge, no skin change and no tenderness. Breasts are symmetrical.  Lymphadenopathy:    She has no cervical adenopathy.    She has no axillary adenopathy.  Neurological: She is alert and oriented to person, place, and time.  Skin: Skin is warm and dry.    Data Reviewed Mammogram reviewed.   Assessment    Patient with  hx of left breast DCIS in 2006.  No evidence of recurring disease.      Plan    Pt to return in 1 year for bilaterally screening mammogram.         Junie Panning G 12/17/2013, 4:02 PM

## 2014-01-09 DIAGNOSIS — R300 Dysuria: Secondary | ICD-10-CM | POA: Diagnosis not present

## 2014-01-09 DIAGNOSIS — M159 Polyosteoarthritis, unspecified: Secondary | ICD-10-CM | POA: Diagnosis not present

## 2014-01-09 DIAGNOSIS — E119 Type 2 diabetes mellitus without complications: Secondary | ICD-10-CM | POA: Diagnosis not present

## 2014-01-09 DIAGNOSIS — R10813 Right lower quadrant abdominal tenderness: Secondary | ICD-10-CM | POA: Diagnosis not present

## 2014-01-09 DIAGNOSIS — R7982 Elevated C-reactive protein (CRP): Secondary | ICD-10-CM | POA: Diagnosis not present

## 2014-01-09 DIAGNOSIS — J309 Allergic rhinitis, unspecified: Secondary | ICD-10-CM | POA: Diagnosis not present

## 2014-01-09 DIAGNOSIS — I10 Essential (primary) hypertension: Secondary | ICD-10-CM | POA: Diagnosis not present

## 2014-01-13 DIAGNOSIS — M79609 Pain in unspecified limb: Secondary | ICD-10-CM | POA: Diagnosis not present

## 2014-01-13 DIAGNOSIS — B351 Tinea unguium: Secondary | ICD-10-CM | POA: Diagnosis not present

## 2014-01-20 DIAGNOSIS — R10813 Right lower quadrant abdominal tenderness: Secondary | ICD-10-CM | POA: Diagnosis not present

## 2014-02-03 DIAGNOSIS — R109 Unspecified abdominal pain: Secondary | ICD-10-CM | POA: Diagnosis not present

## 2014-02-06 DIAGNOSIS — R7982 Elevated C-reactive protein (CRP): Secondary | ICD-10-CM | POA: Diagnosis not present

## 2014-03-11 DIAGNOSIS — R7982 Elevated C-reactive protein (CRP): Secondary | ICD-10-CM | POA: Diagnosis not present

## 2014-04-10 DIAGNOSIS — R7982 Elevated C-reactive protein (CRP): Secondary | ICD-10-CM | POA: Diagnosis not present

## 2014-04-14 DIAGNOSIS — R011 Cardiac murmur, unspecified: Secondary | ICD-10-CM | POA: Diagnosis not present

## 2014-04-14 DIAGNOSIS — N390 Urinary tract infection, site not specified: Secondary | ICD-10-CM | POA: Diagnosis not present

## 2014-04-14 DIAGNOSIS — J309 Allergic rhinitis, unspecified: Secondary | ICD-10-CM | POA: Diagnosis not present

## 2014-04-14 DIAGNOSIS — M545 Low back pain, unspecified: Secondary | ICD-10-CM | POA: Diagnosis not present

## 2014-04-14 DIAGNOSIS — R10813 Right lower quadrant abdominal tenderness: Secondary | ICD-10-CM | POA: Diagnosis not present

## 2014-04-14 DIAGNOSIS — R9431 Abnormal electrocardiogram [ECG] [EKG]: Secondary | ICD-10-CM | POA: Diagnosis not present

## 2014-04-14 DIAGNOSIS — I10 Essential (primary) hypertension: Secondary | ICD-10-CM | POA: Diagnosis not present

## 2014-05-15 DIAGNOSIS — R7982 Elevated C-reactive protein (CRP): Secondary | ICD-10-CM | POA: Diagnosis not present

## 2014-05-15 DIAGNOSIS — R011 Cardiac murmur, unspecified: Secondary | ICD-10-CM | POA: Diagnosis not present

## 2014-05-22 DIAGNOSIS — M79609 Pain in unspecified limb: Secondary | ICD-10-CM | POA: Diagnosis not present

## 2014-05-22 DIAGNOSIS — B351 Tinea unguium: Secondary | ICD-10-CM | POA: Diagnosis not present

## 2014-05-26 DIAGNOSIS — R9431 Abnormal electrocardiogram [ECG] [EKG]: Secondary | ICD-10-CM | POA: Diagnosis not present

## 2014-05-26 DIAGNOSIS — I10 Essential (primary) hypertension: Secondary | ICD-10-CM | POA: Diagnosis not present

## 2014-05-26 DIAGNOSIS — I517 Cardiomegaly: Secondary | ICD-10-CM | POA: Diagnosis not present

## 2014-05-28 DIAGNOSIS — M545 Low back pain, unspecified: Secondary | ICD-10-CM | POA: Diagnosis not present

## 2014-05-28 DIAGNOSIS — R5383 Other fatigue: Secondary | ICD-10-CM | POA: Diagnosis not present

## 2014-05-28 DIAGNOSIS — R5381 Other malaise: Secondary | ICD-10-CM | POA: Diagnosis not present

## 2014-05-28 DIAGNOSIS — I10 Essential (primary) hypertension: Secondary | ICD-10-CM | POA: Diagnosis not present

## 2014-05-28 DIAGNOSIS — R0602 Shortness of breath: Secondary | ICD-10-CM | POA: Diagnosis not present

## 2014-06-25 DIAGNOSIS — G4710 Hypersomnia, unspecified: Secondary | ICD-10-CM | POA: Diagnosis not present

## 2014-06-25 DIAGNOSIS — G4720 Circadian rhythm sleep disorder, unspecified type: Secondary | ICD-10-CM | POA: Diagnosis not present

## 2014-07-02 ENCOUNTER — Ambulatory Visit: Payer: Self-pay | Admitting: Internal Medicine

## 2014-07-02 DIAGNOSIS — M5030 Other cervical disc degeneration, unspecified cervical region: Secondary | ICD-10-CM | POA: Diagnosis not present

## 2014-07-02 DIAGNOSIS — I517 Cardiomegaly: Secondary | ICD-10-CM | POA: Diagnosis not present

## 2014-07-02 DIAGNOSIS — R0902 Hypoxemia: Secondary | ICD-10-CM | POA: Diagnosis not present

## 2014-07-02 DIAGNOSIS — M542 Cervicalgia: Secondary | ICD-10-CM | POA: Diagnosis not present

## 2014-07-02 DIAGNOSIS — M5031 Other cervical disc degeneration,  high cervical region: Secondary | ICD-10-CM | POA: Diagnosis not present

## 2014-07-09 DIAGNOSIS — I517 Cardiomegaly: Secondary | ICD-10-CM | POA: Diagnosis not present

## 2014-07-09 DIAGNOSIS — I119 Hypertensive heart disease without heart failure: Secondary | ICD-10-CM | POA: Diagnosis not present

## 2014-07-14 DIAGNOSIS — M353 Polymyalgia rheumatica: Secondary | ICD-10-CM | POA: Diagnosis not present

## 2014-07-14 DIAGNOSIS — J301 Allergic rhinitis due to pollen: Secondary | ICD-10-CM | POA: Diagnosis not present

## 2014-07-14 DIAGNOSIS — I10 Essential (primary) hypertension: Secondary | ICD-10-CM | POA: Diagnosis not present

## 2014-07-14 DIAGNOSIS — M5092 Cervical disc disorder, unspecified, mid-cervical region: Secondary | ICD-10-CM | POA: Diagnosis not present

## 2014-07-14 DIAGNOSIS — R0902 Hypoxemia: Secondary | ICD-10-CM | POA: Diagnosis not present

## 2014-07-14 DIAGNOSIS — I517 Cardiomegaly: Secondary | ICD-10-CM | POA: Diagnosis not present

## 2014-07-14 DIAGNOSIS — L218 Other seborrheic dermatitis: Secondary | ICD-10-CM | POA: Diagnosis not present

## 2014-07-21 DIAGNOSIS — R7982 Elevated C-reactive protein (CRP): Secondary | ICD-10-CM | POA: Diagnosis not present

## 2014-07-28 ENCOUNTER — Encounter: Payer: Self-pay | Admitting: General Surgery

## 2014-08-11 DIAGNOSIS — M1990 Unspecified osteoarthritis, unspecified site: Secondary | ICD-10-CM | POA: Insufficient documentation

## 2014-08-11 DIAGNOSIS — M353 Polymyalgia rheumatica: Secondary | ICD-10-CM | POA: Insufficient documentation

## 2014-08-13 DIAGNOSIS — M542 Cervicalgia: Secondary | ICD-10-CM | POA: Diagnosis not present

## 2014-08-13 DIAGNOSIS — M353 Polymyalgia rheumatica: Secondary | ICD-10-CM | POA: Diagnosis not present

## 2014-08-13 DIAGNOSIS — R700 Elevated erythrocyte sedimentation rate: Secondary | ICD-10-CM | POA: Diagnosis not present

## 2014-08-29 DIAGNOSIS — R7982 Elevated C-reactive protein (CRP): Secondary | ICD-10-CM | POA: Diagnosis not present

## 2014-09-30 DIAGNOSIS — R7982 Elevated C-reactive protein (CRP): Secondary | ICD-10-CM | POA: Diagnosis not present

## 2014-10-10 DIAGNOSIS — M79674 Pain in right toe(s): Secondary | ICD-10-CM | POA: Diagnosis not present

## 2014-10-10 DIAGNOSIS — M79675 Pain in left toe(s): Secondary | ICD-10-CM | POA: Diagnosis not present

## 2014-10-10 DIAGNOSIS — B351 Tinea unguium: Secondary | ICD-10-CM | POA: Diagnosis not present

## 2014-10-13 DIAGNOSIS — E119 Type 2 diabetes mellitus without complications: Secondary | ICD-10-CM | POA: Diagnosis not present

## 2014-10-13 DIAGNOSIS — J309 Allergic rhinitis, unspecified: Secondary | ICD-10-CM | POA: Diagnosis not present

## 2014-10-13 DIAGNOSIS — E782 Mixed hyperlipidemia: Secondary | ICD-10-CM | POA: Diagnosis not present

## 2014-10-13 DIAGNOSIS — M353 Polymyalgia rheumatica: Secondary | ICD-10-CM | POA: Diagnosis not present

## 2014-10-13 DIAGNOSIS — M5092 Cervical disc disorder, unspecified, mid-cervical region: Secondary | ICD-10-CM | POA: Diagnosis not present

## 2014-10-13 DIAGNOSIS — I10 Essential (primary) hypertension: Secondary | ICD-10-CM | POA: Diagnosis not present

## 2014-10-13 DIAGNOSIS — E039 Hypothyroidism, unspecified: Secondary | ICD-10-CM | POA: Diagnosis not present

## 2014-11-05 DIAGNOSIS — R7982 Elevated C-reactive protein (CRP): Secondary | ICD-10-CM | POA: Diagnosis not present

## 2014-12-05 DIAGNOSIS — R7982 Elevated C-reactive protein (CRP): Secondary | ICD-10-CM | POA: Diagnosis not present

## 2014-12-11 ENCOUNTER — Encounter: Payer: Self-pay | Admitting: General Surgery

## 2014-12-11 ENCOUNTER — Ambulatory Visit: Payer: Self-pay | Admitting: General Surgery

## 2014-12-11 DIAGNOSIS — Z1231 Encounter for screening mammogram for malignant neoplasm of breast: Secondary | ICD-10-CM | POA: Diagnosis not present

## 2014-12-11 DIAGNOSIS — Z853 Personal history of malignant neoplasm of breast: Secondary | ICD-10-CM | POA: Diagnosis not present

## 2014-12-17 ENCOUNTER — Ambulatory Visit: Payer: Medicare Other | Admitting: General Surgery

## 2014-12-25 ENCOUNTER — Ambulatory Visit (INDEPENDENT_AMBULATORY_CARE_PROVIDER_SITE_OTHER): Payer: Medicare Other | Admitting: General Surgery

## 2014-12-25 ENCOUNTER — Encounter: Payer: Self-pay | Admitting: General Surgery

## 2014-12-25 VITALS — BP 152/74 | HR 98 | Resp 18 | Ht 65.0 in | Wt 208.0 lb

## 2014-12-25 DIAGNOSIS — Z853 Personal history of malignant neoplasm of breast: Secondary | ICD-10-CM | POA: Diagnosis not present

## 2014-12-25 DIAGNOSIS — Z86000 Personal history of in-situ neoplasm of breast: Secondary | ICD-10-CM

## 2014-12-25 NOTE — Patient Instructions (Signed)
Continue self breast exams. Call office for any new breast issues or concerns. 

## 2014-12-25 NOTE — Progress Notes (Signed)
Patient ID: Stacy [name], female   DOB: July 07, 1927, 81 y.o.   MRN: [id number]  Chief Complaint  Patient presents with  . Follow-up    mammogram    HPI Stacy [name] is a 81 y.o. female.  who presents for her follow up mammogram and breast cancer follow up. The most recent mammogram was done on 12-11-14.  Patient does perform regular self breast checks and gets regular mammograms done.  No new breast issues.  HPI  Past Medical History  Diagnosis Date  . Hypertension 1940  . Vitiligo 2012  . Asthma   . Arthritis 1940  . Thyroid disorder   . Bowel trouble   . Incisional hernia 2014  . Polymyalgia   . Cancer 2006    DCIS left breast    Past Surgical History  Procedure Laterality Date  . Breast surgery Left 2006    lumpectomy  . Cholecystectomy  1969  . Colon surgery  2004  . Eye surgery  2002  . Foot surgery  1996  . Left breast biopsy   2011  . Colonoscopy  2014    Dr. Candace Cruise  . Hernia repair  2013    History reviewed. No pertinent family history.  Social History History  Substance Use Topics  . Smoking status: Never Smoker   . Smokeless tobacco: Never Used  . Alcohol Use: No    Allergies  Allergen Reactions  . Aspirin Itching  . Codeine Other (See Comments)    dizziness  . Tape Itching  . Zithromax [Azithromycin] Rash    Current Outpatient Prescriptions  Medication Sig Dispense Refill  . acetaminophen (TYLENOL) 325 MG tablet Take 650 mg by mouth every 6 (six) hours as needed for pain.    . cetirizine (ZYRTEC) 10 MG tablet Take 10 mg by mouth daily.    . Flaxseed, Linseed, (FLAXSEED OIL PO) Take 1 capsule by mouth daily.    . fluticasone (FLONASE) 50 MCG/ACT nasal spray     . OXYGEN Inhale into the lungs as needed.    . predniSONE (DELTASONE) 5 MG tablet Take 2.5 mg by mouth daily.      No current facility-administered medications for this visit.    Review of Systems Review of Systems  Constitutional: Negative.   Respiratory: Negative.   Cardiovascular:  Negative.     Blood pressure 152/74, pulse 98, resp. rate 18, height 5\' 5"  (1.651 m), weight 208 lb (94.348 kg).  Physical Exam Physical Exam  Constitutional: She is oriented to person, place, and time. She appears well-developed and well-nourished.  Eyes: Conjunctivae are normal. No scleral icterus.  Neck: Neck supple.  Cardiovascular: Normal rate, regular rhythm and normal heart sounds.   Pulmonary/Chest: Effort normal and breath sounds normal. Right breast exhibits no inverted nipple, no mass, no nipple discharge, no skin change and no tenderness. Left breast exhibits no inverted nipple, no mass, no nipple discharge, no skin change and no tenderness.  Abdominal: Soft. There is no hepatomegaly. There is no tenderness.  Lymphadenopathy:    She has no cervical adenopathy.    She has no axillary adenopathy.  Neurological: She is alert and oriented to person, place, and time.  Skin: Skin is warm and dry.    Data Reviewed Mammogram reviewed and stable.  Assessment    Patient with histroy of left breast DCIS in 2006. No evidence of recurrent disease.    Plan    Patient to return in 1 year with bilateral screening mammogram.  SANKAR,SEEPLAPUTHUR G 12/25/2014, 6:03 PM

## 2015-01-09 DIAGNOSIS — R7982 Elevated C-reactive protein (CRP): Secondary | ICD-10-CM | POA: Diagnosis not present

## 2015-01-13 NOTE — H&P (Signed)
PATIENT NAME:  Stacy [name], Stacy [name] MR#:  [id number] DATE OF BIRTH:  1927-06-27  DATE OF ADMISSION:  04/30/2012  ATTENDING PHYSICIAN: Dr. Marlyce Huge.   CHIEF COMPLAINT: Abdominal pain and vomiting.   HISTORY OF PRESENT ILLNESS:  Stacy [name] is a pleasant 81 year old female with history of a sigmoid colon polyp status post lap assisted sigmoid colectomy with postoperative abdominal wall dehiscence, history of cholecystectomy and appendectomy, history of breast cancer, who presented with two days of nausea and abdominal distension after eating. She says that she first noticed that her belly became distended and that she felt nauseated and  had pain with eating approximately three days ago. Her pain and distension resolved on its own, but her symptoms recurred whenever she ate since then. She met with her primary care doctor today and obtained a CT scan which showed a large hernia containing bowel which changed in calibur within the hernia. After a CT scan she started having a large amount of vomiting and she was told to come to the Emergency Room. Since then she has continued to vomit. She has constant, crampy pain currently which nothing has made better except for IV pain medicine.  It had previously resolved on its own up until today. It is located periumbilically in her left lower quadrant over the hernia but also diffusely throughout her lower abdomen. Her vomiting she describes as bilious, non feculent.  She does have some chills along with her pain. Otherwise, no weight loss. No chest pain, shortness of breath, cough. No flatus today but has continued to have flatus over the last few days. Her last bowel movement was yesterday which she noted was small.   PAST MEDICAL HISTORY:    1. History of lap assisted sigmoid colectomy for a polyp with postoperative abdominal wall dehiscence 2004 2. Status post cholecystectomy. 3. Appendectomy.  4. History of bilateral cataract surgery.  5. History of breast  cancer status post sentinel lymph node biopsy and brachy catheters 2006 6. Arthritis 7. Hypertension.   ALLERGIES: Darvocet, codeine, Zithromax, aspirin, morphine.   CURRENT MEDICATIONS: 1. Prednisone 15 milligrams p.o. daily.  2. Norvasc 5 milligrams p.o. daily.  3. Vitamin C daily.   SOCIAL HISTORY: Denies any tobacco use, ethanol use or illegal drug use. She lives in [place] with her son who stays with her.   FAMILY HISTORY:  Father with lung cancer. Sister with lung cancer which was metastatic to the brain. Cousins with diabetes. Mom with chronic obstructive pulmonary disease.    PHYSICAL EXAMINATION:    VITAL SIGNS: Temperature 98.4, pulse 100, blood pressure 180/117, breathing 16 times a minute, 95%.   GENERAL: Looks uncomfortable, [name]   HEAD: Normocephalic, atraumatic.   EYES: no scleral icterus or conjunctivitis  NECK: No obvious masses, supple  CHEST: Symmetrical motion. Lungs clear to auscultation.   HEART:  Regular rate and rhythm  No murmurs, rubs or gallops  ABDOMEN: Soft, tender to palpation over hernia, but not peritonitic. Large left-sided hernia which had an area which was particularly hard and tender. I was able to reduce part of it and she feels better and the hard area no longer exists.   EXTREMITIES: Moves all extremities well    LABORATORY, RADIOLOGICAL AND DIAGNOSTIC DATA: White cell count 12.5, hemoglobin 13.3, hematocrit 40.6, platelets 235. Renal panel significant for a creatinine of 0.9 and a bicarb of 30.  Urine positive for 2+ leukocyte esterase, negative nitrate and 28 white blood cell counts. Had a CT scan from outside hospital:  however, we have only a report which shows a large ventral hernia containing both large and small bowel with proximal dilatation of small bowel which is a transition point in the hernia and no contrast beyond that.  Also, has a right lower quadrant hernia with small bowel in that with no obvious obstruction.    ASSESSMENT AND PLAN:  Stacy [name] is an unfortunate 81 year old with a large hernia which is causing at least a partial bowel obstruction. She does feel better since I reduced a particularly tender area. Her exam, vs and labs are difficult to interpret in light of her steroid use. In addition, her steroid use make her a high risk operative candidate and likely would recur if we were to operate on her emergently. We will obtain a CBC with differential as well as a lactic acid to evaluate her bowel for possible ischemia and as well as obtain a CT scan with contrast to evaluate for possible persistent obstruction in her hernia. If it is nothing, we will still admit her to ensure that we have not reduced ischemic bowel and perform serial abdominal exams   ____________________________ Glena Norfolk. Bradd Merlos, MD cal:ap D: 04/30/2012 19:19:54 ET T: 05/01/2012 08:10:02 ET JOB#: [id number]  cc: Harrell Gave A. Chennel Olivos, MD, <Dictator> Floyde Parkins MD ELECTRONICALLY SIGNED 05/01/2012 11:22

## 2015-01-13 NOTE — H&P (Signed)
PATIENT NAME:  Stacy [name], Stacy [name] MR#:  [id number] DATE OF BIRTH:  28-Sep-1926  DATE OF ADMISSION:  05/10/2012  CHIEF COMPLAINT: Abdominal pain.   HISTORY OF PRESENT ILLNESS: This is an 81 year old female patient who was admitted and discharge by my partner, Dr. Rexene Edison, 10 days ago where she was admitted for an incarcerated ventral hernia with abdominal pain and nausea, which resolved. She was to see Dr. Rexene Edison on Friday for evaluation and consideration of repair. She was also scheduled to have a colonoscopy prior to this repair; that has not yet been done.   The patient describes abdominal pain today with approximately six emesis. No flatus and no recent bowel movement in two days. Dr. Owens Shark in the Emergency Room reduced a ventral hernia and her pain improved dramatically after the reduction. A CT scan has subsequently demonstrated a large ventral hernia with dilated bowel present.   Of note, she is on prednisone for arthritis.   PAST MEDICAL HISTORY:  1. Laparoscopic assisted sigmoid colectomy. 2. Cholecystectomy. 3. Appendectomy. 4. Breast conservation therapy. 5. Arthritis. 6. Hypertension.  7. She denies myocardial infarction or diabetes.   ALLERGIES: Darvocet, Zithromax, and morphine.   MEDICATIONS:  1. Prednisone 15 mg a day. 2. Norvasc 5 mg a day. 3. Vitamin C.   SOCIAL HISTORY: The patient does not smoke or drink.   FAMILY HISTORY: Noncontributory.   REVIEW OF SYSTEMS: A 10 system review is performed and negative with the exception of that mentioned in the history of present illness.   PHYSICAL EXAMINATION:   GENERAL: Obese female patient. She appears comfortable. BMI 36.  VITALS: Temperature 98.5, pulse 103, respirations 22, blood pressure 186/95, and 97% room air saturation. Pain scale 9, but now much improved.   HEENT: No scleral icterus.   NECK: No palpable neck nodes. She appears Cushingoid in the neck, however.   CHEST: Clear to auscultation.   CARDIAC:  Regular rate and rhythm.   ABDOMEN: Soft, nondistended. There is a large ventral hernia extending from the midline to the left of midline which is soft and only minimally tender and partially reducible. The abdomen is otherwise soft and nontender. No peritoneal signs.   EXTREMITIES: Moderate edema.   NEUROLOGIC: Grossly intact.   INTEGUMENT: No jaundice.   LABS/RADIOLOGIC STUDIES: CT scan is personally reviewed showing the above with incarcerated small bowel without dilatation throughout the abdomen and a large mouth ventral hernia.   Electrolytes are within normal limits. INR 0.8. White blood cell count is 13, hemoglobin and hematocrit 13 and 39, and platelet count 309.   ASSESSMENT AND PLAN: This is a patient recently discharged from the hospital for the same problem who presents with nausea, vomiting, and abdominal pain attributable to an incarcerated ventral hernia. Dr. Owens Shark was able to reduce it quite easily in the ED and it remains partially reduced and her pain is much better than it was when she came in. My plan would be to admit the patient to the hospital and place a nasogastric tube due to the dilated small bowel and try to remove some of the fluid. I discussed with Dr. Marina Gravel, who is on day shift today, about planning elective repair in a patient who has significant medical problems with steroid use and the high risk of recurrence and a possible component separation, etc. This was reviewed with the family. They understood and agreed with this plan.  ____________________________ Jerrol Banana. Burt Knack, MD rec:slb D: 05/10/2012 03:46:20 ET T: 05/10/2012 06:51:37 ET JOB#: [id number]  cc: Jerrol Banana. Burt Knack, MD, <Dictator> Florene Glen MD ELECTRONICALLY SIGNED 05/10/2012 20:39

## 2015-01-13 NOTE — Discharge Summary (Signed)
PATIENT NAME:  Stacy [name], Stacy [name] MR#:  [id number] DATE OF BIRTH:  17-Sep-1927  DATE OF ADMISSION:  04/30/2012 DATE OF DISCHARGE:  05/02/2012  INDICATION FOR ADMISSION: Abdominal pain due to incarcerated ventral hernia.  DISCHARGE DIAGNOSES:  1. Large ventral hernia with possible small bowel obstruction which was reduced but still has a large hernia x2.  2. History of laparoscopic assisted sigmoid colectomy for polyp with postoperative abdominal wall dehiscence in 2004. 3. Status post cholecystectomy. 4. Status post appendectomy. 5. History of bilateral cataract surgery.  6. History of breast cancer status post sentinel lymph node biopsy and brachytherapy catheters in 2006. 7. Arthritis. 8. Hypertension. 9. Rheumatoid arthritis.  DISCHARGE MEDICATIONS: 1. Prednisone 15 mg p.o. daily. 2. Norvasc 5 mg p.o. daily. 3. Vitamin C daily.  HOSPITAL COURSE: Ms. Lippard presented here acutely on 04/30/2012. She had left lower quadrant pain that was bothering her at that time. She had a CT scan done at an outside facility which was read to have a possible bowel obstruction and hernia. On physical exam it was felt that she had a hard lump in her hernia and I actually was able to reduce that, although not the entire hernia. She felt better immediately and over the next day continued to feel better. She began liquids at that time and later was advanced to regular diet which she tolerated. She was discharged on hospital day three.   PHYSICAL EXAMINATION AT DISCHARGE: She had a large left lower quadrant hernia as well as a nonpalpable hernia inferiorly. It was nontender. It was not reducible at that time. She was hemodynamically stable.  DISCHARGE INSTRUCTIONS: 1. She is told not to lift greater than 15 pounds until after she is seen by [place]. 2. She is to continue with her colonoscopy. 3. She is to continue regular diet. 4. She is to follow-up with me in 1 to 2 weeks.  5. She is to follow-up with her primary care  doctor and rheumatologist to tell them she is being evaluated for hernia repair surgery.  6. She is told to return to the ED or call if she has increased pain, nausea, vomiting, swelling, or fevers greater than 101.5. She expressed understanding.   ____________________________ Glena Norfolk. Ari Bernabei, MD cal:drc D: 05/02/2012 20:11:20 ET T: 05/03/2012 10:35:53 ET JOB#: [id number]  cc: Harrell Gave A. Trevia Nop, MD, <Dictator> Floyde Parkins MD ELECTRONICALLY SIGNED 05/04/2012 11:27

## 2015-01-13 NOTE — Discharge Summary (Signed)
PATIENT NAME:  Stacy [name], Stacy [name] MR#:  [id number] DATE OF BIRTH:  May 02, 1927  DATE OF ADMISSION:  05/10/2012 DATE OF DISCHARGE:  05/18/2012  DISCHARGE DIAGNOSES:  1. Incarcerated ventral hernia status post sigmoid colectomy.  2. Rheumatoid arthritis on chronic steroids.  3. Hypertension.  4. Status post cholecystectomy.  5. Status post appendectomy.  6. History of breast conservation therapy.  7. Arthritis.  8. Hypertension.   ALLERGIES: Darvocet, Zithromax, and morphine.   MEDICATIONS ON DISCHARGE:  1. Prednisone 15 mg a day.  2. Norvasc 5 mg p.o. daily. 3. Vitamin C p.o. daily. 4. Tramadol 50 mg p.o. q. 6 hours p.r.n. pain.  5. Norco 1 tab p.o. q. 8 h. p.r.n. pain.    PROCEDURE PERFORMED: Open repair of incarcerated ventral hernia with extensive lysis of adhesions and placement of Strattice biological mesh in underlay fashion.   HOSPITAL COURSE:  Stacy [name], who is well known to me, was admitted on the fifteenth after being recently discharged for a bowel obstruction related to an incarcerated ventral hernia. She comes back in with nausea, vomiting, increased pain, and tenderness over the hernia. She was made n.p.o.  It was initially not completely reduced, but there was a firm area which was reduced and she did feel better. We thought at this time that she would continue to recur and potentially could have strangulated bowel in a future recurrence, so we decided to electively repair this.  We therefore repaired the hernia. We elected not to repair her inferior hernia because of the potential morbidity of a much larger surgery. Postoperatively she was made n.p.o. and had an NG tube for an ileus and was given IV fluids. Over her hospital course she regained bowel function and was able to tolerate first a clear liquid and then regular diet. She then had her pain medicine transitioned from a PCA to p.o. pain medications. She also had her steroids, of which she was given stress dose at the time of  surgery. She was tapered rapidly to her home dose of 15 mg p.o. daily. At time of discharge she was taking good p.o., had good p.o. pain control. She still had drains placed, which were putting out significant amounts- the right approximately 120 on the day of discharge, the left greater than 20 but the day before was greater than 40.   DISCHARGE INSTRUCTIONS:  Stacy [name] was discharged to follow up with [place] in 3 to 5 days for potential drain removal. She is to record the drain output. She is to follow up with her primary doctor and I would prefer that she delay her colonoscopy until she has recovered a bit from this surgery in case an injury to her colon occurred during colonoscopy to require an emergency surgery. More time would make that more feasible. She is to call if she has fever greater than 101.5, increased nausea, increased pain, any change in the quantity or characteristics from her drains. She expressed understanding of this.    ____________________________ Glena Norfolk. Soleil Mas, MD cal:bjt D: 05/18/2012 07:56:44 ET T: 05/18/2012 10:51:18 ET JOB#: [id number]  cc: Harrell Gave A. Manley Fason, MD, <Dictator> Floyde Parkins MD ELECTRONICALLY SIGNED 05/23/2012 18:44

## 2015-01-13 NOTE — Op Note (Signed)
PATIENT NAME:  Stacy [name], LACEY MR#:  [id number] DATE OF BIRTH:  08/03/1927  DATE OF PROCEDURE:  05/11/2012  PREOPERATIVE DIAGNOSIS:  Incarcerated ventral hernia  POSTOPERATIVE DIAGNOSIS: Incarcerated ventral hernia    PROCEDURE: 1: Repair of incarcerated ventral hernia 9x9 cm with Strattice biologic mesh underlay 2: Extensive lysis of adhesions > 45 min  SURGEON: Pedram Goodchild A. Evett Kassa, MD   COSURGEONSherri Rad, MD   ANESTHESIA: General.   COMPLICATIONS: None.   ESTIMATED BLOOD LOSS: 150 mL.   SPECIMENS: None.   IMPLANTS: Approximate 16 x 16 cm piece of Strattice Biologic Mesh.   INDICATION FOR SURGERY: Stacy [name] is a pleasant 81 year old female with a history of an chronically incarcerated large left abdominal hernia, and she has been admitted twice in the last week for a pain and bowel obstruction. She is brought to the Operating Room for repair of incarcerated ventral hernia.   DESCRIPTION OF PROCEDURE: The patient's informed consent was obtained after I explained the procedure to her and all her questions were answered.  She was brought to the Operating Room suite. She was laid in the supine position. She was induced, an endotracheal tube was placed, and general anesthesia was administered. Her abdomen was prepped and draped in standard surgical fashion. A time out was then performed correctly identifying the patient's name, operative site and procedure to be performed. Her hernia was reduced. A midline periumbilical incision was made through her previous incision. When this was deepened, the hernia sac was encountered immediately. It was opened and dissected away from the surrounding tissue.  There was a significant amount of large and small bowel adhesed to the sac which had to be lysed to allow excision of the sac. The superior fascia was freed of any subcutaneous tissue . The sac was then excised. The underside of the fascia was then evaluated, and there was found to be a large  amount of small bowel stuck to it. We carefully brought the small bowel off the abdominal wall sharply. The resultant fascial defect measured 9 x 9 cm. We then made large bilateral subcutaneous flaps to allow the fascia sufficient mobilization to meet without tension.  I did not perform components release as this should not be performed in emergency surgery.  Of note, the patient did have an inferior hernia seen on CT scan which contained loops of nondistended bowel which did not cause her any tenderness.  I was able to palpate the faschial defect of this hernia by placing my hand through the superior hernia defect.  It was significantly (> 10 cm) inferior to the symptomatic hernia.  There was significant quantity of bowel adhesed to the anterior abdominal wall between the two defects which would have to be taken down to connect the two hernias.  As it was asymptomatic, I decided not to repair this inferior hernia at this time.  My rationale was that it would be a much longer and more extensive surgery to repair both hernias, with an increase in potential failure of repair and inadvertant bowel injury.  I did not think the increased morbidity was worth the risk in an 81 yo female on steroids. A piece of a Status Mesh measuring 16 x 16 cm was then placed within the defect to allow approximately 4 cm underlap of the fascia. The mesh was sutured in place using interrupted 0 Prolene U sutures. There were no obvious gaps between the sutures which would allow bowel to slip above the mesh. The fascia was  then closed using two looped #1 PDS ran from each end to the center where they were tied together. To prevent seroma or hematoma formation under her large subcutaneous flaps, I placed two 11-French JP drains in the subcutaneoutissue.  These were sutured to the skin with 3-0 nylon sutures.  Staples were then used to close the incision. The wound was then draped with a sterile gauze and paper tape. The patient was then  awoken and taken to the postanesthesia care unit. There were no immediate complications.  At the end of the case, needle, sponge and instrument counts were correct.  I was present and scrubbed for the entirety of the case.   ____________________________ Stacy [name]. Edona Schreffler, MD cal:cbb D: 05/11/2012 17:28:29 ET T: 05/11/2012 18:14:32 ET JOB#: [id number]  cc: Harrell Gave A. Ayah Cozzolino, MD, <Dictator> Floyde Parkins MD ELECTRONICALLY SIGNED 05/14/2012 11:02

## 2015-01-16 NOTE — Op Note (Signed)
PATIENT NAME:  Stacy [name], Stacy [name] MR#:  [id number] DATE OF BIRTH:  1927-04-18  DATE OF PROCEDURE:  03/19/2013  PREOPERATIVE DIAGNOSIS:  Recurrent incisional hernia.   POSTOPERATIVE DIAGNOSIS:  Recurrent incisional hernia.   OPERATION:  Laparoscopy, conversion to open repair of recurrent incisional hernia with mesh.   SURGEON:  Mckinley Jewel, M.D.   ANESTHESIA:  General.   COMPLICATIONS:  None.   ESTIMATED BLOOD LOSS:  50 mL.   DRAINS:  None.   DESCRIPTION OF PROCEDURE:  The patient was put to sleep in the supine position on the operating table.  A Foley catheter was inserted which was removed at the end of the procedure.  The abdomen was prepped and draped out as a sterile field and a timeout procedure performed.  The patient had a previous right subcostal incision and therefore the left side was approached for the laparoscopy.  In the left upper quadrant, a small incision was made and a Veress needle with sleeve was positioned in the peritoneal cavity, verified with the hanging drop method and pneumoperitoneum was obtained followed by placement of an 11 mm XL port.  With the camera in place, there appeared to be a significant amount of adhesions.  Some of them were fairly flimsy and omental.  Two additional 5 mm ports were in the left side of the abdomen and these adhesions were taken down until it was noted there was significant adhesion of small bowel loops to the anterior abdominal peritoneum, somewhat dense in places.  There was a window through which an actual edge of the fascial defect could be identified overlying the pubic symphysis which is where her hernia was located.  At this point it was felt that it was safer to attempt an open repair rather than continue with additional lysis of adhesions with the risk of injury to the bowel.  The ports were removed and after removal of the pneumoperitoneum, the previous lower midline incision was opened above the pubic symphysis upward for a distance of  about 2.5 to 3 inches.  Incision was carried down all the way down to the level of the fascia.  Bleeding was controlled with cautery and further dissection was performed carefully until the peritoneal cavity could be entered into.  It was noted at this time that this was probably would involve significant lysis of adhesions to place an intraperitoneal mesh.  It was felt that a preperitoneal mesh may be more appropriate in this setting.  Accordingly, the peritoneal opening was closed with a running 2-0 Vicryl.  The fascia was lifted up and the preperitoneal space was then expanded to expose the fascial defect which was overlying the pubic symphysis with one edge located just on the left edge of the pubic symphysis in the right side extending somewhat for a good distance.  A 15 x 15 cm oval shaped Physio mesh was brought up to the field.  This was then positioned across the defect and tacked in places all around the medial superior and inferior aspect of the defect with the secure strap and a few placed along the superior aspect laterally to keep it in place and after this was adequately covered, it was felt the repair was sufficient.  Wound was irrigated and the fascia was then closed with interrupted figure-of-eight stitches of 0 Prolene.  The wound was irrigated again.  Subcutaneous tissue was closed with 2-0 Vicryl.  The skin was closed with staples and the port sites were also closed with staples.  Dry sterile dressing was placed.  The patient tolerated the procedure well.  No immediate problems were encountered.  She was subsequently extubated and returned to the recovery room in stable condition.    ____________________________ S.Robinette Haines, MD sgs:ea D: 03/19/2013 20:15:37 ET T: 03/20/2013 00:21:36 ET JOB#: [id number]  cc: Synthia Innocent. Jamal Collin, MD, <Dictator> [facility] Robinette Haines MD ELECTRONICALLY SIGNED 03/22/2013 15:35

## 2015-01-27 DIAGNOSIS — R0902 Hypoxemia: Secondary | ICD-10-CM | POA: Diagnosis not present

## 2015-01-27 DIAGNOSIS — J301 Allergic rhinitis due to pollen: Secondary | ICD-10-CM | POA: Diagnosis not present

## 2015-02-12 DIAGNOSIS — R300 Dysuria: Secondary | ICD-10-CM | POA: Diagnosis not present

## 2015-02-12 DIAGNOSIS — I10 Essential (primary) hypertension: Secondary | ICD-10-CM | POA: Diagnosis not present

## 2015-02-12 DIAGNOSIS — R05 Cough: Secondary | ICD-10-CM | POA: Diagnosis not present

## 2015-02-12 DIAGNOSIS — M353 Polymyalgia rheumatica: Secondary | ICD-10-CM | POA: Diagnosis not present

## 2015-02-12 DIAGNOSIS — J309 Allergic rhinitis, unspecified: Secondary | ICD-10-CM | POA: Diagnosis not present

## 2015-02-12 DIAGNOSIS — R7982 Elevated C-reactive protein (CRP): Secondary | ICD-10-CM | POA: Diagnosis not present

## 2015-02-12 DIAGNOSIS — E119 Type 2 diabetes mellitus without complications: Secondary | ICD-10-CM | POA: Diagnosis not present

## 2015-02-12 DIAGNOSIS — Z0001 Encounter for general adult medical examination with abnormal findings: Secondary | ICD-10-CM | POA: Diagnosis not present

## 2015-02-20 ENCOUNTER — Other Ambulatory Visit: Payer: Self-pay | Admitting: Physician Assistant

## 2015-02-20 ENCOUNTER — Ambulatory Visit
Admission: RE | Admit: 2015-02-20 | Discharge: 2015-02-20 | Disposition: A | Discharge status: Home / Self Care | Payer: Medicare Other | Source: Ambulatory Visit | Attending: Physician Assistant | Admitting: Physician Assistant

## 2015-02-20 DIAGNOSIS — I10 Essential (primary) hypertension: Secondary | ICD-10-CM | POA: Diagnosis not present

## 2015-02-20 DIAGNOSIS — R05 Cough: Secondary | ICD-10-CM

## 2015-02-20 DIAGNOSIS — E119 Type 2 diabetes mellitus without complications: Secondary | ICD-10-CM | POA: Diagnosis not present

## 2015-02-20 DIAGNOSIS — E559 Vitamin D deficiency, unspecified: Secondary | ICD-10-CM | POA: Diagnosis not present

## 2015-02-20 DIAGNOSIS — J449 Chronic obstructive pulmonary disease, unspecified: Secondary | ICD-10-CM | POA: Insufficient documentation

## 2015-02-20 DIAGNOSIS — E079 Disorder of thyroid, unspecified: Secondary | ICD-10-CM | POA: Diagnosis not present

## 2015-02-20 DIAGNOSIS — I517 Cardiomegaly: Secondary | ICD-10-CM | POA: Insufficient documentation

## 2015-02-20 DIAGNOSIS — Z0001 Encounter for general adult medical examination with abnormal findings: Secondary | ICD-10-CM | POA: Diagnosis not present

## 2015-02-20 DIAGNOSIS — E782 Mixed hyperlipidemia: Secondary | ICD-10-CM | POA: Diagnosis not present

## 2015-02-20 DIAGNOSIS — R059 Cough, unspecified: Secondary | ICD-10-CM

## 2015-02-20 DIAGNOSIS — R0602 Shortness of breath: Secondary | ICD-10-CM | POA: Diagnosis not present

## 2015-03-02 DIAGNOSIS — E039 Hypothyroidism, unspecified: Secondary | ICD-10-CM | POA: Diagnosis not present

## 2015-03-02 DIAGNOSIS — E119 Type 2 diabetes mellitus without complications: Secondary | ICD-10-CM | POA: Diagnosis not present

## 2015-03-02 DIAGNOSIS — I10 Essential (primary) hypertension: Secondary | ICD-10-CM | POA: Diagnosis not present

## 2015-03-02 DIAGNOSIS — E782 Mixed hyperlipidemia: Secondary | ICD-10-CM | POA: Diagnosis not present

## 2015-03-02 DIAGNOSIS — R05 Cough: Secondary | ICD-10-CM | POA: Diagnosis not present

## 2015-03-02 DIAGNOSIS — N189 Chronic kidney disease, unspecified: Secondary | ICD-10-CM | POA: Diagnosis not present

## 2015-03-16 DIAGNOSIS — R7982 Elevated C-reactive protein (CRP): Secondary | ICD-10-CM | POA: Diagnosis not present

## 2015-03-25 DIAGNOSIS — B351 Tinea unguium: Secondary | ICD-10-CM | POA: Diagnosis not present

## 2015-03-25 DIAGNOSIS — M79674 Pain in right toe(s): Secondary | ICD-10-CM | POA: Diagnosis not present

## 2015-03-25 DIAGNOSIS — M79675 Pain in left toe(s): Secondary | ICD-10-CM | POA: Diagnosis not present

## 2015-04-01 DIAGNOSIS — R0602 Shortness of breath: Secondary | ICD-10-CM | POA: Diagnosis not present

## 2015-04-16 DIAGNOSIS — R7982 Elevated C-reactive protein (CRP): Secondary | ICD-10-CM | POA: Diagnosis not present

## 2015-05-12 ENCOUNTER — Ambulatory Visit
Admission: RE | Admit: 2015-05-12 | Discharge: 2015-05-12 | Disposition: A | Discharge status: Home / Self Care | Payer: Medicare Other | Source: Ambulatory Visit | Attending: Physician Assistant | Admitting: Physician Assistant

## 2015-05-12 ENCOUNTER — Other Ambulatory Visit: Payer: Self-pay | Admitting: Physician Assistant

## 2015-05-12 DIAGNOSIS — R413 Other amnesia: Secondary | ICD-10-CM | POA: Diagnosis not present

## 2015-05-12 DIAGNOSIS — R05 Cough: Secondary | ICD-10-CM

## 2015-05-12 DIAGNOSIS — R059 Cough, unspecified: Secondary | ICD-10-CM

## 2015-05-12 DIAGNOSIS — J069 Acute upper respiratory infection, unspecified: Secondary | ICD-10-CM | POA: Diagnosis not present

## 2015-05-12 DIAGNOSIS — M353 Polymyalgia rheumatica: Secondary | ICD-10-CM | POA: Diagnosis not present

## 2015-05-12 DIAGNOSIS — D649 Anemia, unspecified: Secondary | ICD-10-CM | POA: Diagnosis not present

## 2015-05-12 DIAGNOSIS — J449 Chronic obstructive pulmonary disease, unspecified: Secondary | ICD-10-CM | POA: Diagnosis not present

## 2015-05-12 DIAGNOSIS — J301 Allergic rhinitis due to pollen: Secondary | ICD-10-CM | POA: Diagnosis not present

## 2015-05-19 DIAGNOSIS — R7982 Elevated C-reactive protein (CRP): Secondary | ICD-10-CM | POA: Diagnosis not present

## 2015-05-21 DIAGNOSIS — E538 Deficiency of other specified B group vitamins: Secondary | ICD-10-CM | POA: Diagnosis not present

## 2015-05-21 DIAGNOSIS — D649 Anemia, unspecified: Secondary | ICD-10-CM | POA: Diagnosis not present

## 2015-05-21 DIAGNOSIS — E1165 Type 2 diabetes mellitus with hyperglycemia: Secondary | ICD-10-CM | POA: Diagnosis not present

## 2015-05-21 DIAGNOSIS — N182 Chronic kidney disease, stage 2 (mild): Secondary | ICD-10-CM | POA: Diagnosis not present

## 2015-05-21 DIAGNOSIS — E756 Lipid storage disorder, unspecified: Secondary | ICD-10-CM | POA: Diagnosis not present

## 2015-05-21 DIAGNOSIS — Z79899 Other long term (current) drug therapy: Secondary | ICD-10-CM | POA: Diagnosis not present

## 2015-05-21 DIAGNOSIS — E079 Disorder of thyroid, unspecified: Secondary | ICD-10-CM | POA: Diagnosis not present

## 2015-05-21 DIAGNOSIS — E0781 Sick-euthyroid syndrome: Secondary | ICD-10-CM | POA: Diagnosis not present

## 2015-05-21 DIAGNOSIS — R5381 Other malaise: Secondary | ICD-10-CM | POA: Diagnosis not present

## 2015-05-21 DIAGNOSIS — E559 Vitamin D deficiency, unspecified: Secondary | ICD-10-CM | POA: Diagnosis not present

## 2015-05-21 DIAGNOSIS — E782 Mixed hyperlipidemia: Secondary | ICD-10-CM | POA: Diagnosis not present

## 2015-05-21 DIAGNOSIS — R5383 Other fatigue: Secondary | ICD-10-CM | POA: Diagnosis not present

## 2015-05-21 DIAGNOSIS — I10 Essential (primary) hypertension: Secondary | ICD-10-CM | POA: Diagnosis not present

## 2015-05-25 DIAGNOSIS — M810 Age-related osteoporosis without current pathological fracture: Secondary | ICD-10-CM | POA: Diagnosis not present

## 2015-05-25 DIAGNOSIS — E119 Type 2 diabetes mellitus without complications: Secondary | ICD-10-CM | POA: Diagnosis not present

## 2015-05-25 DIAGNOSIS — E782 Mixed hyperlipidemia: Secondary | ICD-10-CM | POA: Diagnosis not present

## 2015-05-25 DIAGNOSIS — I10 Essential (primary) hypertension: Secondary | ICD-10-CM | POA: Diagnosis not present

## 2015-05-25 DIAGNOSIS — J449 Chronic obstructive pulmonary disease, unspecified: Secondary | ICD-10-CM | POA: Diagnosis not present

## 2015-05-25 DIAGNOSIS — M353 Polymyalgia rheumatica: Secondary | ICD-10-CM | POA: Diagnosis not present

## 2015-06-04 DIAGNOSIS — M810 Age-related osteoporosis without current pathological fracture: Secondary | ICD-10-CM | POA: Diagnosis not present

## 2015-06-04 DIAGNOSIS — E2839 Other primary ovarian failure: Secondary | ICD-10-CM | POA: Diagnosis not present

## 2015-06-24 DIAGNOSIS — R7982 Elevated C-reactive protein (CRP): Secondary | ICD-10-CM | POA: Diagnosis not present

## 2015-06-30 DIAGNOSIS — Z23 Encounter for immunization: Secondary | ICD-10-CM | POA: Diagnosis not present

## 2015-07-14 DIAGNOSIS — J449 Chronic obstructive pulmonary disease, unspecified: Secondary | ICD-10-CM | POA: Diagnosis not present

## 2015-07-14 DIAGNOSIS — J301 Allergic rhinitis due to pollen: Secondary | ICD-10-CM | POA: Diagnosis not present

## 2015-07-14 DIAGNOSIS — R0902 Hypoxemia: Secondary | ICD-10-CM | POA: Diagnosis not present

## 2015-07-16 DIAGNOSIS — I5020 Unspecified systolic (congestive) heart failure: Secondary | ICD-10-CM | POA: Diagnosis not present

## 2015-07-16 DIAGNOSIS — J449 Chronic obstructive pulmonary disease, unspecified: Secondary | ICD-10-CM | POA: Diagnosis not present

## 2015-07-20 DIAGNOSIS — Z961 Presence of intraocular lens: Secondary | ICD-10-CM | POA: Diagnosis not present

## 2015-07-22 DIAGNOSIS — R7982 Elevated C-reactive protein (CRP): Secondary | ICD-10-CM | POA: Diagnosis not present

## 2015-08-27 DIAGNOSIS — B351 Tinea unguium: Secondary | ICD-10-CM | POA: Diagnosis not present

## 2015-08-27 DIAGNOSIS — M79675 Pain in left toe(s): Secondary | ICD-10-CM | POA: Diagnosis not present

## 2015-08-27 DIAGNOSIS — R7982 Elevated C-reactive protein (CRP): Secondary | ICD-10-CM | POA: Diagnosis not present

## 2015-08-27 DIAGNOSIS — M79674 Pain in right toe(s): Secondary | ICD-10-CM | POA: Diagnosis not present

## 2015-10-02 ENCOUNTER — Other Ambulatory Visit: Payer: Self-pay

## 2015-10-02 DIAGNOSIS — Z1231 Encounter for screening mammogram for malignant neoplasm of breast: Secondary | ICD-10-CM

## 2015-10-07 DIAGNOSIS — I10 Essential (primary) hypertension: Secondary | ICD-10-CM | POA: Diagnosis not present

## 2015-10-07 DIAGNOSIS — E119 Type 2 diabetes mellitus without complications: Secondary | ICD-10-CM | POA: Diagnosis not present

## 2015-10-07 DIAGNOSIS — J449 Chronic obstructive pulmonary disease, unspecified: Secondary | ICD-10-CM | POA: Diagnosis not present

## 2015-10-07 DIAGNOSIS — E782 Mixed hyperlipidemia: Secondary | ICD-10-CM | POA: Diagnosis not present

## 2015-10-19 ENCOUNTER — Encounter: Payer: Self-pay | Admitting: *Deleted

## 2015-10-28 DIAGNOSIS — R7982 Elevated C-reactive protein (CRP): Secondary | ICD-10-CM | POA: Diagnosis not present

## 2015-11-03 DIAGNOSIS — R0602 Shortness of breath: Secondary | ICD-10-CM | POA: Diagnosis not present

## 2015-11-03 DIAGNOSIS — M353 Polymyalgia rheumatica: Secondary | ICD-10-CM | POA: Diagnosis not present

## 2015-11-26 DIAGNOSIS — R7982 Elevated C-reactive protein (CRP): Secondary | ICD-10-CM | POA: Diagnosis not present

## 2015-12-14 ENCOUNTER — Ambulatory Visit
Admission: RE | Admit: 2015-12-14 | Discharge: 2015-12-14 | Disposition: A | Discharge status: Home / Self Care | Payer: Medicare Other | Source: Ambulatory Visit | Attending: General Surgery | Admitting: General Surgery

## 2015-12-14 ENCOUNTER — Other Ambulatory Visit: Payer: Self-pay | Admitting: General Surgery

## 2015-12-14 DIAGNOSIS — Z1231 Encounter for screening mammogram for malignant neoplasm of breast: Secondary | ICD-10-CM

## 2015-12-22 ENCOUNTER — Ambulatory Visit: Payer: Medicare Other | Admitting: General Surgery

## 2015-12-22 DIAGNOSIS — E119 Type 2 diabetes mellitus without complications: Secondary | ICD-10-CM | POA: Diagnosis not present

## 2015-12-22 DIAGNOSIS — R05 Cough: Secondary | ICD-10-CM | POA: Diagnosis not present

## 2015-12-22 DIAGNOSIS — J069 Acute upper respiratory infection, unspecified: Secondary | ICD-10-CM | POA: Diagnosis not present

## 2015-12-22 DIAGNOSIS — J449 Chronic obstructive pulmonary disease, unspecified: Secondary | ICD-10-CM | POA: Diagnosis not present

## 2015-12-22 DIAGNOSIS — I10 Essential (primary) hypertension: Secondary | ICD-10-CM | POA: Diagnosis not present

## 2015-12-22 DIAGNOSIS — J1189 Influenza due to unidentified influenza virus with other manifestations: Secondary | ICD-10-CM | POA: Diagnosis not present

## 2015-12-28 ENCOUNTER — Ambulatory Visit (INDEPENDENT_AMBULATORY_CARE_PROVIDER_SITE_OTHER): Payer: Medicare Other | Admitting: General Surgery

## 2015-12-28 ENCOUNTER — Encounter: Payer: Self-pay | Admitting: General Surgery

## 2015-12-28 VITALS — BP 138/74 | HR 76 | Resp 16 | Ht 65.0 in | Wt 192.0 lb

## 2015-12-28 DIAGNOSIS — Z853 Personal history of malignant neoplasm of breast: Secondary | ICD-10-CM | POA: Diagnosis not present

## 2015-12-28 DIAGNOSIS — Z86000 Personal history of in-situ neoplasm of breast: Secondary | ICD-10-CM

## 2015-12-28 NOTE — Patient Instructions (Signed)
The patient has been asked to return to the office in one year with a bilateral screening mammogram. 

## 2015-12-28 NOTE — Progress Notes (Signed)
Patient ID: Stacy [name], female   DOB: 1927/01/10, 82 y.o.   MRN: [id number]  Chief Complaint  Patient presents with  . Follow-up    mammogram    HPI Stacy [name] is a 82 y.o. female who presents for a breast cancer follow up The most recent mammogram was done on 12/14/15.  Patient does perform regular self breast checks and gets regular mammograms done.   I have reviewed the history of present illness with the patient. HPI  Past Medical History  Diagnosis Date  . Hypertension 1940  . Vitiligo 2012  . Asthma   . Arthritis 1940  . Thyroid disorder   . Bowel trouble   . Incisional hernia 2014  . Polymyalgia ([place])   . Cancer [facility]) 2006    DCIS left breast    Past Surgical History  Procedure Laterality Date  . Breast surgery Left 2006    lumpectomy  . Cholecystectomy  1969  . Colon surgery  2004  . Eye surgery  2002  . Foot surgery  1996  . Left breast biopsy   2011  . Colonoscopy  2014    Dr. Candace Cruise  . Hernia repair  2013    History reviewed. No pertinent family history.  Social History Social History  Substance Use Topics  . Smoking status: Never Smoker   . Smokeless tobacco: Never Used  . Alcohol Use: No    Allergies  Allergen Reactions  . Aspirin Itching  . Codeine Other (See Comments)    dizziness  . Tape Itching  . Zithromax [Azithromycin] Rash    Current Outpatient Prescriptions  Medication Sig Dispense Refill  . acetaminophen (TYLENOL) 325 MG tablet Take 650 mg by mouth every 6 (six) hours as needed for pain.    . cetirizine (ZYRTEC) 10 MG tablet Take 10 mg by mouth daily.    . Flaxseed, Linseed, (FLAXSEED OIL PO) Take 1 capsule by mouth daily.    . fluticasone (FLONASE) 50 MCG/ACT nasal spray     . OXYGEN Inhale into the lungs as needed.    . predniSONE (DELTASONE) 5 MG tablet Take 2.5 mg by mouth daily.      No current facility-administered medications for this visit.    Review of Systems Review of Systems  Constitutional: Negative.    Respiratory: Negative.   Cardiovascular: Negative.     Blood pressure 138/74, pulse 76, resp. rate 16, height 5\' 5"  (1.651 m), weight 192 lb (87.091 kg).  Physical Exam Physical Exam  Constitutional: She is oriented to person, place, and time. She appears well-developed and well-nourished.  Eyes: Conjunctivae are normal. No scleral icterus.  Neck: Neck supple.  Cardiovascular: Normal rate, regular rhythm and normal heart sounds.   Pulmonary/Chest: Effort normal and breath sounds normal. Right breast exhibits no inverted nipple, no mass, no nipple discharge, no skin change and no tenderness. Left breast exhibits no inverted nipple, no mass, no nipple discharge, no skin change and no tenderness.    Left breast upper inner quadrant firmness, unchanged from past visits.  Abdominal: Soft. Normal appearance and bowel sounds are normal. There is no hepatomegaly. There is no tenderness. No hernia.  Lymphadenopathy:    She has no cervical adenopathy.    She has no axillary adenopathy.  Neurological: She is alert and oriented to person, place, and time.  Skin: Skin is warm and dry.    Data Reviewed Mammogram reviewed  Assessment       Patient with histroy of left  breast DCIS in 2006. No evidence of recurrent disease.  Plan    The patient has been asked to return to the office in one year with a bilateral screening mammogram.    PCP:  Clayborn Bigness  This information has been scribed by Gaspar Cola CMA.    SANKAR,SEEPLAPUTHUR G 12/28/2015, 2:38 PM

## 2016-01-04 DIAGNOSIS — R7982 Elevated C-reactive protein (CRP): Secondary | ICD-10-CM | POA: Diagnosis not present

## 2016-02-02 DIAGNOSIS — Z23 Encounter for immunization: Secondary | ICD-10-CM | POA: Diagnosis not present

## 2016-02-18 DIAGNOSIS — Z853 Personal history of malignant neoplasm of breast: Secondary | ICD-10-CM | POA: Diagnosis not present

## 2016-02-18 DIAGNOSIS — J449 Chronic obstructive pulmonary disease, unspecified: Secondary | ICD-10-CM | POA: Diagnosis not present

## 2016-02-18 DIAGNOSIS — E119 Type 2 diabetes mellitus without complications: Secondary | ICD-10-CM | POA: Diagnosis not present

## 2016-02-18 DIAGNOSIS — Z23 Encounter for immunization: Secondary | ICD-10-CM | POA: Diagnosis not present

## 2016-02-18 DIAGNOSIS — Z0001 Encounter for general adult medical examination with abnormal findings: Secondary | ICD-10-CM | POA: Diagnosis not present

## 2016-02-18 DIAGNOSIS — M353 Polymyalgia rheumatica: Secondary | ICD-10-CM | POA: Diagnosis not present

## 2016-02-18 DIAGNOSIS — M064 Inflammatory polyarthropathy: Secondary | ICD-10-CM | POA: Diagnosis not present

## 2016-02-18 DIAGNOSIS — H9193 Unspecified hearing loss, bilateral: Secondary | ICD-10-CM | POA: Diagnosis not present

## 2016-03-07 DIAGNOSIS — R7982 Elevated C-reactive protein (CRP): Secondary | ICD-10-CM | POA: Diagnosis not present

## 2016-03-10 DIAGNOSIS — M542 Cervicalgia: Secondary | ICD-10-CM | POA: Diagnosis not present

## 2016-03-10 DIAGNOSIS — M25512 Pain in left shoulder: Secondary | ICD-10-CM | POA: Diagnosis not present

## 2016-03-10 DIAGNOSIS — M353 Polymyalgia rheumatica: Secondary | ICD-10-CM | POA: Diagnosis not present

## 2016-03-14 DIAGNOSIS — M79675 Pain in left toe(s): Secondary | ICD-10-CM | POA: Diagnosis not present

## 2016-03-14 DIAGNOSIS — B351 Tinea unguium: Secondary | ICD-10-CM | POA: Diagnosis not present

## 2016-03-14 DIAGNOSIS — M79674 Pain in right toe(s): Secondary | ICD-10-CM | POA: Diagnosis not present

## 2016-03-23 DIAGNOSIS — J440 Chronic obstructive pulmonary disease with acute lower respiratory infection: Secondary | ICD-10-CM | POA: Diagnosis not present

## 2016-04-05 DIAGNOSIS — R7982 Elevated C-reactive protein (CRP): Secondary | ICD-10-CM | POA: Diagnosis not present

## 2016-04-07 DIAGNOSIS — J449 Chronic obstructive pulmonary disease, unspecified: Secondary | ICD-10-CM | POA: Diagnosis not present

## 2016-04-26 DIAGNOSIS — Z0001 Encounter for general adult medical examination with abnormal findings: Secondary | ICD-10-CM | POA: Diagnosis not present

## 2016-04-26 DIAGNOSIS — E1165 Type 2 diabetes mellitus with hyperglycemia: Secondary | ICD-10-CM | POA: Diagnosis not present

## 2016-04-26 DIAGNOSIS — I129 Hypertensive chronic kidney disease with stage 1 through stage 4 chronic kidney disease, or unspecified chronic kidney disease: Secondary | ICD-10-CM | POA: Diagnosis not present

## 2016-04-26 DIAGNOSIS — D649 Anemia, unspecified: Secondary | ICD-10-CM | POA: Diagnosis not present

## 2016-04-26 DIAGNOSIS — E039 Hypothyroidism, unspecified: Secondary | ICD-10-CM | POA: Diagnosis not present

## 2016-04-26 DIAGNOSIS — R010 Benign and innocent cardiac murmurs: Secondary | ICD-10-CM | POA: Diagnosis not present

## 2016-04-26 DIAGNOSIS — R972 Elevated prostate specific antigen [PSA]: Secondary | ICD-10-CM | POA: Diagnosis not present

## 2016-04-26 DIAGNOSIS — D6481 Anemia due to antineoplastic chemotherapy: Secondary | ICD-10-CM | POA: Diagnosis not present

## 2016-04-26 DIAGNOSIS — E782 Mixed hyperlipidemia: Secondary | ICD-10-CM | POA: Diagnosis not present

## 2016-04-26 DIAGNOSIS — E538 Deficiency of other specified B group vitamins: Secondary | ICD-10-CM | POA: Diagnosis not present

## 2016-05-03 DIAGNOSIS — M353 Polymyalgia rheumatica: Secondary | ICD-10-CM | POA: Diagnosis not present

## 2016-05-03 DIAGNOSIS — R0602 Shortness of breath: Secondary | ICD-10-CM | POA: Diagnosis not present

## 2016-05-16 DIAGNOSIS — R7982 Elevated C-reactive protein (CRP): Secondary | ICD-10-CM | POA: Diagnosis not present

## 2016-06-20 DIAGNOSIS — M353 Polymyalgia rheumatica: Secondary | ICD-10-CM | POA: Diagnosis not present

## 2016-06-20 DIAGNOSIS — I10 Essential (primary) hypertension: Secondary | ICD-10-CM | POA: Diagnosis not present

## 2016-06-20 DIAGNOSIS — Z23 Encounter for immunization: Secondary | ICD-10-CM | POA: Diagnosis not present

## 2016-06-20 DIAGNOSIS — E559 Vitamin D deficiency, unspecified: Secondary | ICD-10-CM | POA: Diagnosis not present

## 2016-06-20 DIAGNOSIS — D649 Anemia, unspecified: Secondary | ICD-10-CM | POA: Diagnosis not present

## 2016-06-20 DIAGNOSIS — E119 Type 2 diabetes mellitus without complications: Secondary | ICD-10-CM | POA: Diagnosis not present

## 2016-06-20 DIAGNOSIS — N181 Chronic kidney disease, stage 1: Secondary | ICD-10-CM | POA: Diagnosis not present

## 2016-07-21 DIAGNOSIS — R7982 Elevated C-reactive protein (CRP): Secondary | ICD-10-CM | POA: Diagnosis not present

## 2016-07-27 DIAGNOSIS — B351 Tinea unguium: Secondary | ICD-10-CM | POA: Diagnosis not present

## 2016-07-27 DIAGNOSIS — M79675 Pain in left toe(s): Secondary | ICD-10-CM | POA: Diagnosis not present

## 2016-07-27 DIAGNOSIS — M79674 Pain in right toe(s): Secondary | ICD-10-CM | POA: Diagnosis not present

## 2016-08-10 DIAGNOSIS — D649 Anemia, unspecified: Secondary | ICD-10-CM | POA: Diagnosis not present

## 2016-08-10 DIAGNOSIS — N181 Chronic kidney disease, stage 1: Secondary | ICD-10-CM | POA: Diagnosis not present

## 2016-08-24 DIAGNOSIS — R7982 Elevated C-reactive protein (CRP): Secondary | ICD-10-CM | POA: Diagnosis not present

## 2016-08-30 DIAGNOSIS — E559 Vitamin D deficiency, unspecified: Secondary | ICD-10-CM | POA: Diagnosis not present

## 2016-08-30 DIAGNOSIS — D519 Vitamin B12 deficiency anemia, unspecified: Secondary | ICD-10-CM | POA: Diagnosis not present

## 2016-08-30 DIAGNOSIS — M353 Polymyalgia rheumatica: Secondary | ICD-10-CM | POA: Diagnosis not present

## 2016-08-30 DIAGNOSIS — E119 Type 2 diabetes mellitus without complications: Secondary | ICD-10-CM | POA: Diagnosis not present

## 2016-08-30 DIAGNOSIS — D649 Anemia, unspecified: Secondary | ICD-10-CM | POA: Diagnosis not present

## 2016-08-30 DIAGNOSIS — N181 Chronic kidney disease, stage 1: Secondary | ICD-10-CM | POA: Diagnosis not present

## 2016-08-30 DIAGNOSIS — I10 Essential (primary) hypertension: Secondary | ICD-10-CM | POA: Diagnosis not present

## 2016-09-06 DIAGNOSIS — M353 Polymyalgia rheumatica: Secondary | ICD-10-CM | POA: Diagnosis not present

## 2016-09-06 DIAGNOSIS — R0602 Shortness of breath: Secondary | ICD-10-CM | POA: Diagnosis not present

## 2016-09-06 DIAGNOSIS — J449 Chronic obstructive pulmonary disease, unspecified: Secondary | ICD-10-CM | POA: Diagnosis not present

## 2016-09-06 DIAGNOSIS — R05 Cough: Secondary | ICD-10-CM | POA: Diagnosis not present

## 2016-09-23 DIAGNOSIS — R7982 Elevated C-reactive protein (CRP): Secondary | ICD-10-CM | POA: Diagnosis not present

## 2016-10-25 ENCOUNTER — Other Ambulatory Visit: Payer: Self-pay

## 2016-10-25 DIAGNOSIS — Z1231 Encounter for screening mammogram for malignant neoplasm of breast: Secondary | ICD-10-CM

## 2016-11-07 DIAGNOSIS — B351 Tinea unguium: Secondary | ICD-10-CM | POA: Diagnosis not present

## 2016-11-07 DIAGNOSIS — R7982 Elevated C-reactive protein (CRP): Secondary | ICD-10-CM | POA: Diagnosis not present

## 2016-11-07 DIAGNOSIS — D519 Vitamin B12 deficiency anemia, unspecified: Secondary | ICD-10-CM | POA: Diagnosis not present

## 2016-11-07 DIAGNOSIS — M79675 Pain in left toe(s): Secondary | ICD-10-CM | POA: Diagnosis not present

## 2016-11-07 DIAGNOSIS — M79674 Pain in right toe(s): Secondary | ICD-10-CM | POA: Diagnosis not present

## 2016-12-08 DIAGNOSIS — R7982 Elevated C-reactive protein (CRP): Secondary | ICD-10-CM | POA: Diagnosis not present

## 2016-12-15 ENCOUNTER — Encounter: Payer: Self-pay | Admitting: *Deleted

## 2016-12-15 ENCOUNTER — Ambulatory Visit
Admission: RE | Admit: 2016-12-15 | Discharge: 2016-12-15 | Disposition: A | Discharge status: Home / Self Care | Payer: Medicare Other | Source: Ambulatory Visit | Attending: General Surgery | Admitting: General Surgery

## 2016-12-15 DIAGNOSIS — Z1231 Encounter for screening mammogram for malignant neoplasm of breast: Secondary | ICD-10-CM | POA: Diagnosis present

## 2016-12-15 HISTORY — DX: Personal history of irradiation: Z92.3

## 2016-12-21 DIAGNOSIS — D519 Vitamin B12 deficiency anemia, unspecified: Secondary | ICD-10-CM | POA: Diagnosis not present

## 2016-12-21 DIAGNOSIS — R0602 Shortness of breath: Secondary | ICD-10-CM | POA: Diagnosis not present

## 2016-12-22 ENCOUNTER — Encounter: Payer: Self-pay | Admitting: General Surgery

## 2016-12-22 ENCOUNTER — Ambulatory Visit (INDEPENDENT_AMBULATORY_CARE_PROVIDER_SITE_OTHER): Payer: Medicare Other | Admitting: General Surgery

## 2016-12-22 VITALS — BP 138/76 | HR 66 | Resp 16 | Ht 64.5 in | Wt 189.0 lb

## 2016-12-22 DIAGNOSIS — Z86000 Personal history of in-situ neoplasm of breast: Secondary | ICD-10-CM | POA: Diagnosis not present

## 2016-12-22 NOTE — Progress Notes (Signed)
Patient ID: Stacy [name], female   DOB: Jul 26, 1927, 83 y.o.   MRN: [id number]  Chief Complaint  Patient presents with  . Follow-up    HPI Stacy [name] is a 83 y.o. female who presents for follow up DCIS and a breast evaluation. The most recent mammogram was done on 12/15/2016.  Patient does perform regular self breast checks and gets regular mammograms done.   No new breast issues, only polymyalgia issues. I have reviewed the history of present illness with the patient.  HPI  Past Medical History:  Diagnosis Date  . Arthritis 1940  . Asthma   . Bowel trouble   . Cancer [facility]) 2006   DCIS left breast  . Hypertension 1940  . Incisional hernia 2014  . Personal history of radiation therapy   . Polymyalgia ([place])   . Thyroid disorder   . Vitiligo 2012    Past Surgical History:  Procedure Laterality Date  . BREAST BIOPSY Left 2006   +  . BREAST SURGERY Left 2006   lumpectomy  . CHOLECYSTECTOMY  1969  . COLON SURGERY  2004  . COLONOSCOPY  2014   Dr. Candace Cruise  . EYE SURGERY  2002  . FOOT SURGERY  1996  . HERNIA REPAIR  2013  . left breast biopsy   2011    Family History  Problem Relation Age of Onset  . Breast cancer Neg Hx     Social History Social History  Substance Use Topics  . Smoking status: Never Smoker  . Smokeless tobacco: Never Used  . Alcohol use No    Allergies  Allergen Reactions  . Aspirin Itching  . Codeine Other (See Comments)    dizziness  . Tape Itching  . Zithromax [Azithromycin] Rash    Current Outpatient Prescriptions  Medication Sig Dispense Refill  . acetaminophen (TYLENOL) 325 MG tablet Take 650 mg by mouth every 6 (six) hours as needed for pain.    . fluticasone (FLONASE) 50 MCG/ACT nasal spray as needed.     . OXYGEN Inhale into the lungs as needed.    . predniSONE (DELTASONE) 5 MG tablet Take 2.5 mg by mouth daily.      No current facility-administered medications for this visit.     Review of Systems Review of Systems   Constitutional: Negative.   Respiratory: Negative.   Cardiovascular: Negative.     Blood pressure 138/76, pulse 66, resp. rate 16, height 5' 4.5" (1.638 m), weight 85.7 kg (189 lb).  Physical Exam Physical Exam  Constitutional: She is oriented to person, place, and time. She appears well-developed and well-nourished.  HENT:  Mouth/Throat: Oropharynx is clear and moist.  Eyes: Conjunctivae are normal. No scleral icterus.  Neck: Neck supple.  Cardiovascular: Normal rate, regular rhythm and normal heart sounds.   Pulmonary/Chest: Effort normal and breath sounds normal. Right breast exhibits no inverted nipple, no mass, no nipple discharge, no skin change and no tenderness. Left breast exhibits no inverted nipple, no mass, no nipple discharge, no skin change and no tenderness.  Left breast upper inner quadrant firmness, unchanged from past visits  Abdominal: Soft. There is no tenderness.  Lymphadenopathy:    She has no cervical adenopathy.    She has no axillary adenopathy.  Neurological: She is alert and oriented to person, place, and time.  Skin: Skin is warm and dry.  Psychiatric: Her behavior is normal.    Data Reviewed Mammogram reviewed   Assessment     Patient with histroy  of left breast DCIS in 2006. No evidence of recurrent or new disease    Plan    The patient has been asked to return to the office in one year with a bilateral screening mammogram with Dr Bary Castilla.     This information has been scribed by Karie Fetch RN, BSN,BC.    Noland Pizano G 12/26/2016, 8:52 AM

## 2016-12-22 NOTE — Patient Instructions (Addendum)
The patient is aware to call back for any questions or concerns.  The patient has been asked to return to the office in one year with a bilateral screening mammogram with Dr Bary Castilla

## 2017-01-12 DIAGNOSIS — I10 Essential (primary) hypertension: Secondary | ICD-10-CM | POA: Diagnosis not present

## 2017-01-12 DIAGNOSIS — R05 Cough: Secondary | ICD-10-CM | POA: Diagnosis not present

## 2017-01-12 DIAGNOSIS — M353 Polymyalgia rheumatica: Secondary | ICD-10-CM | POA: Diagnosis not present

## 2017-01-12 DIAGNOSIS — J301 Allergic rhinitis due to pollen: Secondary | ICD-10-CM | POA: Diagnosis not present

## 2017-02-22 DIAGNOSIS — R7982 Elevated C-reactive protein (CRP): Secondary | ICD-10-CM | POA: Diagnosis not present

## 2017-04-11 DIAGNOSIS — J449 Chronic obstructive pulmonary disease, unspecified: Secondary | ICD-10-CM | POA: Diagnosis not present

## 2017-04-11 DIAGNOSIS — R0602 Shortness of breath: Secondary | ICD-10-CM | POA: Diagnosis not present

## 2017-04-11 DIAGNOSIS — J301 Allergic rhinitis due to pollen: Secondary | ICD-10-CM | POA: Diagnosis not present

## 2017-04-14 ENCOUNTER — Other Ambulatory Visit: Payer: Self-pay

## 2017-04-17 DIAGNOSIS — M79675 Pain in left toe(s): Secondary | ICD-10-CM | POA: Diagnosis not present

## 2017-04-17 DIAGNOSIS — M79674 Pain in right toe(s): Secondary | ICD-10-CM | POA: Diagnosis not present

## 2017-04-17 DIAGNOSIS — B351 Tinea unguium: Secondary | ICD-10-CM | POA: Diagnosis not present

## 2017-04-25 DIAGNOSIS — R7982 Elevated C-reactive protein (CRP): Secondary | ICD-10-CM | POA: Diagnosis not present

## 2017-05-24 DIAGNOSIS — J449 Chronic obstructive pulmonary disease, unspecified: Secondary | ICD-10-CM | POA: Diagnosis not present

## 2017-05-24 DIAGNOSIS — I10 Essential (primary) hypertension: Secondary | ICD-10-CM | POA: Diagnosis not present

## 2017-05-24 DIAGNOSIS — E119 Type 2 diabetes mellitus without complications: Secondary | ICD-10-CM | POA: Diagnosis not present

## 2017-05-24 DIAGNOSIS — Z0001 Encounter for general adult medical examination with abnormal findings: Secondary | ICD-10-CM | POA: Diagnosis not present

## 2017-05-24 DIAGNOSIS — M150 Primary generalized (osteo)arthritis: Secondary | ICD-10-CM | POA: Diagnosis not present

## 2017-06-27 DIAGNOSIS — R7982 Elevated C-reactive protein (CRP): Secondary | ICD-10-CM | POA: Diagnosis not present

## 2017-07-17 DIAGNOSIS — Z0001 Encounter for general adult medical examination with abnormal findings: Secondary | ICD-10-CM | POA: Diagnosis not present

## 2017-07-17 DIAGNOSIS — I10 Essential (primary) hypertension: Secondary | ICD-10-CM | POA: Diagnosis not present

## 2017-07-17 DIAGNOSIS — E782 Mixed hyperlipidemia: Secondary | ICD-10-CM | POA: Diagnosis not present

## 2017-07-28 DIAGNOSIS — R944 Abnormal results of kidney function studies: Secondary | ICD-10-CM | POA: Diagnosis not present

## 2017-09-11 ENCOUNTER — Ambulatory Visit: Payer: Self-pay

## 2017-09-21 ENCOUNTER — Encounter: Payer: Self-pay | Admitting: Nurse Practitioner

## 2017-09-21 ENCOUNTER — Ambulatory Visit (INDEPENDENT_AMBULATORY_CARE_PROVIDER_SITE_OTHER): Payer: Medicare Other | Admitting: Nurse Practitioner

## 2017-09-21 VITALS — BP 135/63 | HR 68 | Resp 16 | Ht 64.5 in | Wt 194.4 lb

## 2017-09-21 DIAGNOSIS — R05 Cough: Secondary | ICD-10-CM | POA: Insufficient documentation

## 2017-09-21 DIAGNOSIS — D519 Vitamin B12 deficiency anemia, unspecified: Secondary | ICD-10-CM | POA: Insufficient documentation

## 2017-09-21 DIAGNOSIS — M545 Low back pain, unspecified: Secondary | ICD-10-CM | POA: Insufficient documentation

## 2017-09-21 DIAGNOSIS — R269 Unspecified abnormalities of gait and mobility: Secondary | ICD-10-CM | POA: Diagnosis not present

## 2017-09-21 DIAGNOSIS — R059 Cough, unspecified: Secondary | ICD-10-CM | POA: Insufficient documentation

## 2017-09-21 DIAGNOSIS — J309 Allergic rhinitis, unspecified: Secondary | ICD-10-CM | POA: Insufficient documentation

## 2017-09-21 DIAGNOSIS — M5092 Unspecified cervical disc disorder, mid-cervical region, unspecified level: Secondary | ICD-10-CM | POA: Insufficient documentation

## 2017-09-21 DIAGNOSIS — J301 Allergic rhinitis due to pollen: Secondary | ICD-10-CM | POA: Insufficient documentation

## 2017-09-21 DIAGNOSIS — J440 Chronic obstructive pulmonary disease with acute lower respiratory infection: Secondary | ICD-10-CM | POA: Insufficient documentation

## 2017-09-21 DIAGNOSIS — M47817 Spondylosis without myelopathy or radiculopathy, lumbosacral region: Secondary | ICD-10-CM | POA: Insufficient documentation

## 2017-09-21 DIAGNOSIS — N3946 Mixed incontinence: Secondary | ICD-10-CM | POA: Insufficient documentation

## 2017-09-21 DIAGNOSIS — R300 Dysuria: Secondary | ICD-10-CM | POA: Insufficient documentation

## 2017-09-21 DIAGNOSIS — J209 Acute bronchitis, unspecified: Secondary | ICD-10-CM | POA: Insufficient documentation

## 2017-09-21 DIAGNOSIS — M150 Primary generalized (osteo)arthritis: Secondary | ICD-10-CM | POA: Insufficient documentation

## 2017-09-21 DIAGNOSIS — E2839 Other primary ovarian failure: Secondary | ICD-10-CM | POA: Insufficient documentation

## 2017-09-21 DIAGNOSIS — R339 Retention of urine, unspecified: Secondary | ICD-10-CM | POA: Insufficient documentation

## 2017-09-21 DIAGNOSIS — N181 Chronic kidney disease, stage 1: Secondary | ICD-10-CM | POA: Diagnosis not present

## 2017-09-21 DIAGNOSIS — R32 Unspecified urinary incontinence: Secondary | ICD-10-CM | POA: Insufficient documentation

## 2017-09-21 DIAGNOSIS — E119 Type 2 diabetes mellitus without complications: Secondary | ICD-10-CM | POA: Diagnosis not present

## 2017-09-21 DIAGNOSIS — L218 Other seborrheic dermatitis: Secondary | ICD-10-CM | POA: Insufficient documentation

## 2017-09-21 DIAGNOSIS — G4734 Idiopathic sleep related nonobstructive alveolar hypoventilation: Secondary | ICD-10-CM

## 2017-09-21 DIAGNOSIS — M353 Polymyalgia rheumatica: Secondary | ICD-10-CM | POA: Diagnosis not present

## 2017-09-21 DIAGNOSIS — R010 Benign and innocent cardiac murmurs: Secondary | ICD-10-CM | POA: Insufficient documentation

## 2017-09-21 DIAGNOSIS — R0602 Shortness of breath: Secondary | ICD-10-CM | POA: Insufficient documentation

## 2017-09-21 DIAGNOSIS — R55 Syncope and collapse: Secondary | ICD-10-CM | POA: Insufficient documentation

## 2017-09-21 DIAGNOSIS — E782 Mixed hyperlipidemia: Secondary | ICD-10-CM | POA: Insufficient documentation

## 2017-09-21 DIAGNOSIS — E785 Hyperlipidemia, unspecified: Secondary | ICD-10-CM | POA: Insufficient documentation

## 2017-09-21 DIAGNOSIS — I517 Cardiomegaly: Secondary | ICD-10-CM | POA: Insufficient documentation

## 2017-09-21 DIAGNOSIS — J449 Chronic obstructive pulmonary disease, unspecified: Secondary | ICD-10-CM | POA: Insufficient documentation

## 2017-09-21 DIAGNOSIS — M810 Age-related osteoporosis without current pathological fracture: Secondary | ICD-10-CM | POA: Insufficient documentation

## 2017-09-21 DIAGNOSIS — N189 Chronic kidney disease, unspecified: Secondary | ICD-10-CM | POA: Insufficient documentation

## 2017-09-21 DIAGNOSIS — R413 Other amnesia: Secondary | ICD-10-CM | POA: Insufficient documentation

## 2017-09-21 DIAGNOSIS — I10 Essential (primary) hypertension: Secondary | ICD-10-CM

## 2017-09-21 DIAGNOSIS — R0902 Hypoxemia: Secondary | ICD-10-CM | POA: Insufficient documentation

## 2017-09-21 DIAGNOSIS — N319 Neuromuscular dysfunction of bladder, unspecified: Secondary | ICD-10-CM | POA: Insufficient documentation

## 2017-09-21 DIAGNOSIS — M25569 Pain in unspecified knee: Secondary | ICD-10-CM | POA: Insufficient documentation

## 2017-09-21 DIAGNOSIS — R42 Dizziness and giddiness: Secondary | ICD-10-CM | POA: Insufficient documentation

## 2017-09-21 DIAGNOSIS — H9193 Unspecified hearing loss, bilateral: Secondary | ICD-10-CM | POA: Insufficient documentation

## 2017-09-21 DIAGNOSIS — Z23 Encounter for immunization: Secondary | ICD-10-CM

## 2017-09-21 DIAGNOSIS — G4700 Insomnia, unspecified: Secondary | ICD-10-CM | POA: Insufficient documentation

## 2017-09-21 DIAGNOSIS — E559 Vitamin D deficiency, unspecified: Secondary | ICD-10-CM | POA: Insufficient documentation

## 2017-09-21 HISTORY — DX: Other primary ovarian failure: E28.39

## 2017-09-21 NOTE — Addendum Note (Signed)
Addended by: Edd Arbour on: 09/21/2017 03:28 PM   Modules accepted: Orders

## 2017-09-21 NOTE — Progress Notes (Signed)
Subjective:     Patient ID: Stacy [name], female   DOB: [birth year], 83 y.o.   MRN: [id number]  The patient is here for follow up visit. She has had ultrasound of her kidneys and renal arteries.Renal functions have been elevated the past routine blood work. She has noted no issues. No blood in the urine or flank pain present.  She uses oxygen at night. She uses this at 2 liters per minute at night. She needs a new order for her nocturnal oxygen.  Finally, the patient suffers from severe inflammatory polyarthropathy and fibromyalgia. She needs to have a manual wheelchair so family members can push her when there is extended distance to walk.     Current Outpatient Medications:  .  acetaminophen (TYLENOL) 325 MG tablet, Take 650 mg by mouth every 6 (six) hours as needed for pain., Disp: , Rfl:  .  benzonatate (TESSALON) 200 MG capsule, Take 200 mg by mouth 2 (two) times daily as needed for cough., Disp: , Rfl:  .  fluticasone (FLONASE) 50 MCG/ACT nasal spray, as needed. , Disp: , Rfl:  .  glucose blood test strip, 1 each by Other route as needed for other. Use as instructed, Disp: , Rfl:  .  lisinopril (PRINIVIL,ZESTRIL) 10 MG tablet, Take 10 mg by mouth every morning. tak 1/2 tablet qam., Disp: , Rfl:  .  montelukast (SINGULAIR) 10 MG tablet, Take 10 mg by mouth as needed., Disp: , Rfl:  .  OXYGEN, Inhale into the lungs as needed., Disp: , Rfl:  .  predniSONE (DELTASONE) 5 MG tablet, Take 7 mg by mouth daily. , Disp: , Rfl:   Review of Systems  HENT: Negative.   Respiratory: Positive for shortness of breath.   Cardiovascular: Negative for chest pain and palpitations.  Gastrointestinal: Negative.   Endocrine:       Well controlled blood sugars.   Genitourinary: Negative.   Musculoskeletal: Positive for arthralgias, gait problem, joint swelling and myalgias.  Skin: Negative.   Allergic/Immunologic: Negative.   Neurological: Positive for weakness. Negative for headaches.  Hematological:  Negative.   Psychiatric/Behavioral: Negative.    Today's Vitals   09/21/17 1354  BP: 135/63  Pulse: 68  Resp: 16  SpO2: 96%  Weight: 194 lb 6.4 oz (88.2 kg)  Height: 5' 4.5" (1.638 m)      Objective:   Physical Exam  Constitutional: She is oriented to person, place, and time. She appears well-developed and well-nourished.  HENT:  Head: Normocephalic.  Eyes: Pupils are equal, round, and reactive to light.  Neck: Normal range of motion. Neck supple.  Cardiovascular: Normal rate and regular rhythm.  Murmur heard. Pulmonary/Chest: Effort normal and breath sounds normal. She has no wheezes.  Abdominal: Soft. There is no tenderness.  Musculoskeletal:       Legs: The patient has generalized joint pain with point tenderness present. Arthritic changes are noted in multiple joints. Generalized weakness is present. She requires a great deal of assistance with standing up from seated position and then requires help with balance and mobility while walking .  Neurological: She is alert and oriented to person, place, and time.  Skin: Skin is warm and dry.  Psychiatric: She has a normal mood and affect.  Nursing note and vitals reviewed.      Assessment:     Chronic kidney disease, stage 1, normal or increased GFR  Polymyalgia rheumatica (HCC)  Abnormality of gait and mobility  Type 2 diabetes mellitus without complication, unspecified whether  long term insulin use (HCC)  Hypertension, unspecified type  Nocturnal hypoxemia     Plan:     1. Persistent elevation of renal functions. Reviewed renal ultrasoundshowing mild cortical thinning of both kidneys without renal artery stenosis. Recheck bmp following next visit and refer to nephrology as indicated.  2. Stop celebrex due to intermittent GI bleeding. Recommend OTC tylenol arthritis as needed and as indicated. Regular visits with rheumatology as indicated. 3. Will send order for manual wheelchair to help patient and family with  mobility.  4. Blood sugars stable. Continue diabetic meds as prescribed  5. bp stable. Continue bp meds as prescribed  6. Will get new order for nocturnal oxygen at 2 lpm via nasal cannula.  7. Flu vaccine administered today.  Follow up 3 months and sooner if needed

## 2017-09-25 NOTE — Progress Notes (Signed)
Faxed [facility] manual [facility] to [facility] and order for nocturnal oxygen to [facility] with office notes.  dbs

## 2017-10-12 DIAGNOSIS — M79675 Pain in left toe(s): Secondary | ICD-10-CM | POA: Diagnosis not present

## 2017-10-12 DIAGNOSIS — B351 Tinea unguium: Secondary | ICD-10-CM | POA: Diagnosis not present

## 2017-10-12 DIAGNOSIS — R7982 Elevated C-reactive protein (CRP): Secondary | ICD-10-CM | POA: Diagnosis not present

## 2017-10-12 DIAGNOSIS — M79674 Pain in right toe(s): Secondary | ICD-10-CM | POA: Diagnosis not present

## 2017-10-20 DIAGNOSIS — I509 Heart failure, unspecified: Secondary | ICD-10-CM | POA: Insufficient documentation

## 2017-10-31 ENCOUNTER — Other Ambulatory Visit: Payer: Self-pay

## 2017-10-31 DIAGNOSIS — Z1231 Encounter for screening mammogram for malignant neoplasm of breast: Secondary | ICD-10-CM

## 2017-12-08 ENCOUNTER — Ambulatory Visit: Payer: Self-pay | Admitting: Nurse Practitioner

## 2017-12-11 ENCOUNTER — Encounter: Payer: Self-pay | Admitting: Nurse Practitioner

## 2017-12-11 ENCOUNTER — Ambulatory Visit (INDEPENDENT_AMBULATORY_CARE_PROVIDER_SITE_OTHER): Payer: Medicare Other | Admitting: Nurse Practitioner

## 2017-12-11 VITALS — BP 140/76 | HR 77 | Resp 16 | Ht 64.0 in | Wt 191.8 lb

## 2017-12-11 DIAGNOSIS — I10 Essential (primary) hypertension: Secondary | ICD-10-CM | POA: Diagnosis not present

## 2017-12-11 DIAGNOSIS — J410 Simple chronic bronchitis: Secondary | ICD-10-CM | POA: Diagnosis not present

## 2017-12-11 DIAGNOSIS — E1165 Type 2 diabetes mellitus with hyperglycemia: Secondary | ICD-10-CM | POA: Diagnosis not present

## 2017-12-11 DIAGNOSIS — M150 Primary generalized (osteo)arthritis: Secondary | ICD-10-CM | POA: Diagnosis not present

## 2017-12-11 LAB — POCT GLYCOSYLATED HEMOGLOBIN (HGB A1C): Hemoglobin A1C: 6.1

## 2017-12-11 MED ORDER — TRAMADOL HCL 50 MG PO TABS: 50.0000 mg | ORAL_TABLET | Freq: Every evening | ORAL | 2 refills | Status: DC | PRN

## 2017-12-11 NOTE — Progress Notes (Signed)
[facility] [place], [place] [address]  Internal MEDICINE  Office Visit Note  Patient Name: Stacy [name]  [id number]  [id number]  Date of Service: 12/31/2017  Chief Complaint  Patient presents with  . Osteoarthritis    gwneralized    The patient is here for routine follow up exam. Still having generalized arthritic pain, especially in the knee knees. Did have bad night last night due to pain. Could not get comfortable and tossed and turned most of the night. Had to stop celebrex, as she had noted intermittent GI bleeding.     Pt is here for routine follow up.    Current Medication: Outpatient Encounter Medications as of 12/11/2017  Medication Sig Note  . acetaminophen (TYLENOL) 325 MG tablet Take 650 mg by mouth every 6 (six) hours as needed for pain.   . benzonatate (TESSALON) 200 MG capsule Take 200 mg by mouth 2 (two) times daily as needed for cough.   . Flaxseed, Linseed, (FLAX SEED OIL PO) Take by mouth.   . fluticasone (FLONASE) 50 MCG/ACT nasal spray as needed.  12/25/2014: Received from: External Pharmacy  . glucose blood test strip 1 each by Other route as needed for other. Use as instructed   . lisinopril (PRINIVIL,ZESTRIL) 10 MG tablet Take 10 mg by mouth every morning. tak 1/2 tablet qam.   . montelukast (SINGULAIR) 10 MG tablet Take 10 mg by mouth as needed.   . OXYGEN Inhale into the lungs as needed.   . predniSONE (DELTASONE) 5 MG tablet Take 7 mg by mouth daily.    . traMADol (ULTRAM) 50 MG tablet Take 1 tablet (50 mg total) by mouth at bedtime as needed.    No facility-administered encounter medications on file as of 12/11/2017.     Surgical History: Past Surgical History:  Procedure Laterality Date  . BREAST BIOPSY Left 2006   +  . BREAST SURGERY Left 2006   lumpectomy  . CHOLECYSTECTOMY  1969  . COLON SURGERY  2004  . COLONOSCOPY  2014   Dr. Candace Cruise  . EYE SURGERY  2002  . FOOT SURGERY  1996  . HERNIA REPAIR  2013  . left breast  biopsy   2011    Medical History: Past Medical History:  Diagnosis Date  . Arthritis 1940  . Asthma   . Bowel trouble   . Cancer [facility]) 2006   DCIS left breast  . Hypertension 1940  . Incisional hernia 2014  . Personal history of radiation therapy   . Polymyalgia ([place])   . Thyroid disorder   . Vitiligo 2012    Family History: Family History  Problem Relation Age of Onset  . Heart disease Mother   . Emphysema Mother   . Arthritis Father   . Cancer Father   . Breast cancer Neg Hx     Social History   Socioeconomic History  . Marital status: Single    Spouse name: Not on file  . Number of children: Not on file  . Years of education: Not on file  . Highest education level: Not on file  Occupational History  . Not on file  Social Needs  . Financial resource strain: Not on file  . Food insecurity:    Worry: Not on file    Inability: Not on file  . Transportation needs:    Medical: Not on file    Non-medical: Not on file  Tobacco Use  . Smoking status: Never Smoker  .  Smokeless tobacco: Never Used  Substance and Sexual Activity  . Alcohol use: No  . Drug use: No  . Sexual activity: Not on file  Lifestyle  . Physical activity:    Days per week: Not on file    Minutes per session: Not on file  . Stress: Not on file  Relationships  . Social connections:    Talks on phone: Not on file    Gets together: Not on file    Attends religious service: Not on file    Active member of club or organization: Not on file    Attends meetings of clubs or organizations: Not on file    Relationship status: Not on file  . Intimate partner violence:    Fear of current or ex partner: Not on file    Emotionally abused: Not on file    Physically abused: Not on file    Forced sexual activity: Not on file  Other Topics Concern  . Not on file  Social History Narrative  . Not on file      Review of Systems  Constitutional: Negative for activity change, fatigue and  unexpected weight change.  HENT: Negative for congestion, rhinorrhea and sore throat.   Eyes: Negative.   Respiratory: Positive for shortness of breath. Negative for chest tightness.   Cardiovascular: Negative for chest pain and palpitations.  Gastrointestinal: Negative for nausea and vomiting.  Endocrine:       Well controlled blood sugars.   Genitourinary: Negative.   Musculoskeletal: Positive for arthralgias, gait problem, joint swelling and myalgias.  Skin: Negative.   Allergic/Immunologic: Negative.   Neurological: Positive for weakness. Negative for headaches.  Hematological: Negative for adenopathy.  Psychiatric/Behavioral: Negative for dysphoric mood. The patient is not nervous/anxious.     Today's Vitals   12/11/17 1410  BP: 140/76  Pulse: 77  Resp: 16  SpO2: 97%  Weight: 191 lb 12.8 oz (87 kg)  Height: 5\' 4"  (1.626 m)    Physical Exam  Constitutional: She is oriented to person, place, and time. She appears well-developed and well-nourished.  HENT:  Head: Normocephalic and atraumatic.  Eyes: Pupils are equal, round, and reactive to light.  Neck: Normal range of motion. Neck supple. Carotid bruit is not present. No thyromegaly present.  Cardiovascular: Normal rate and regular rhythm.  Murmur heard. Pulmonary/Chest: Effort normal and breath sounds normal. She has no wheezes.  Abdominal: Soft. Bowel sounds are normal. There is no tenderness.  Musculoskeletal:       Legs: The patient has generalized joint pain with point tenderness present. Arthritic changes are noted in multiple joints. Generalized weakness is present. She requires a great deal of assistance with standing up from seated position and then requires help with balance and mobility while walking .  Lymphadenopathy:    She has no cervical adenopathy.  Neurological: She is alert and oriented to person, place, and time.  Skin: Skin is warm and dry.  Psychiatric: She has a normal mood and affect. Her behavior  is normal. Judgment and thought content normal.  Nursing note and vitals reviewed.  Assessment/Plan: 1. Type 2 diabetes mellitus with hyperglycemia, unspecified whether long term insulin use (HCC) - POCT HgB A1C 6.1 today. Continue diabetic medication as prescribed.   2. Primary generalized (osteo)arthritis Add tramadol 50mg  at bedtime as needed for pain. Consider using 1/2 tablet and increasing to 1 tablet if needed. Continue to take tylenol arthritis as needed for mild to moderate pain during the day.  -  traMADol (ULTRAM) 50 MG tablet; Take 1 tablet (50 mg total) by mouth at bedtime as needed.  Dispense: 30 tablet; Refill: 2  3. Essential hypertension Stable. Continue bp medication as prescribed   4. Simple chronic bronchitis (HCC) Continue to use inhalers and oxygen as previously prescribed.   General Counseling: jhaniya briski understanding of the findings of todays visit and agrees with plan of treatment. I have discussed any further diagnostic evaluation that may be needed or ordered today. We also reviewed her medications today. she has been encouraged to call the office with any questions or concerns that should arise related to todays visit.   This patient was seen by Leretha Pol, FNP- C in Collaboration with Dr [name] as a part of collaborative care agreement    Orders Placed This Encounter  Procedures  . POCT HgB A1C    Meds ordered this encounter  Medications  . traMADol (ULTRAM) 50 MG tablet    Sig: Take 1 tablet (50 mg total) by mouth at bedtime as needed.    Dispense:  30 tablet    Refill:  2    Order Specific Question:   Supervising Provider    Answer:   [name] [[id number]]    Time spent: 27 Minutes     Dr [name] Internal medicine

## 2017-12-14 ENCOUNTER — Other Ambulatory Visit: Payer: Self-pay

## 2017-12-21 DIAGNOSIS — M25512 Pain in left shoulder: Secondary | ICD-10-CM | POA: Diagnosis not present

## 2017-12-21 DIAGNOSIS — G5602 Carpal tunnel syndrome, left upper limb: Secondary | ICD-10-CM | POA: Diagnosis not present

## 2017-12-21 DIAGNOSIS — M353 Polymyalgia rheumatica: Secondary | ICD-10-CM | POA: Diagnosis not present

## 2017-12-21 DIAGNOSIS — M25561 Pain in right knee: Secondary | ICD-10-CM | POA: Diagnosis not present

## 2017-12-21 DIAGNOSIS — G8929 Other chronic pain: Secondary | ICD-10-CM | POA: Diagnosis not present

## 2017-12-21 DIAGNOSIS — M25562 Pain in left knee: Secondary | ICD-10-CM | POA: Diagnosis not present

## 2017-12-21 DIAGNOSIS — M25511 Pain in right shoulder: Secondary | ICD-10-CM | POA: Diagnosis not present

## 2017-12-21 DIAGNOSIS — G5601 Carpal tunnel syndrome, right upper limb: Secondary | ICD-10-CM | POA: Diagnosis not present

## 2017-12-26 ENCOUNTER — Ambulatory Visit: Payer: Medicare Other | Admitting: General Surgery

## 2017-12-31 DIAGNOSIS — E1165 Type 2 diabetes mellitus with hyperglycemia: Secondary | ICD-10-CM | POA: Insufficient documentation

## 2017-12-31 DIAGNOSIS — J410 Simple chronic bronchitis: Secondary | ICD-10-CM | POA: Insufficient documentation

## 2018-01-22 DIAGNOSIS — M542 Cervicalgia: Secondary | ICD-10-CM | POA: Diagnosis not present

## 2018-01-22 DIAGNOSIS — M5032 Other cervical disc degeneration, mid-cervical region, unspecified level: Secondary | ICD-10-CM | POA: Diagnosis not present

## 2018-01-22 DIAGNOSIS — M5134 Other intervertebral disc degeneration, thoracic region: Secondary | ICD-10-CM | POA: Diagnosis not present

## 2018-01-22 DIAGNOSIS — M546 Pain in thoracic spine: Secondary | ICD-10-CM | POA: Diagnosis not present

## 2018-01-22 DIAGNOSIS — G8929 Other chronic pain: Secondary | ICD-10-CM | POA: Diagnosis not present

## 2018-01-22 DIAGNOSIS — M25512 Pain in left shoulder: Secondary | ICD-10-CM | POA: Diagnosis not present

## 2018-01-23 ENCOUNTER — Ambulatory Visit: Payer: Medicare Other | Admitting: General Surgery

## 2018-02-07 DIAGNOSIS — M79674 Pain in right toe(s): Secondary | ICD-10-CM | POA: Diagnosis not present

## 2018-02-07 DIAGNOSIS — M79675 Pain in left toe(s): Secondary | ICD-10-CM | POA: Diagnosis not present

## 2018-02-07 DIAGNOSIS — B351 Tinea unguium: Secondary | ICD-10-CM | POA: Diagnosis not present

## 2018-02-08 DIAGNOSIS — R29898 Other symptoms and signs involving the musculoskeletal system: Secondary | ICD-10-CM | POA: Diagnosis not present

## 2018-02-08 DIAGNOSIS — M25511 Pain in right shoulder: Secondary | ICD-10-CM | POA: Diagnosis not present

## 2018-02-08 DIAGNOSIS — M25512 Pain in left shoulder: Secondary | ICD-10-CM | POA: Diagnosis not present

## 2018-02-08 DIAGNOSIS — M542 Cervicalgia: Secondary | ICD-10-CM | POA: Diagnosis not present

## 2018-02-08 DIAGNOSIS — M25611 Stiffness of right shoulder, not elsewhere classified: Secondary | ICD-10-CM | POA: Diagnosis not present

## 2018-02-08 DIAGNOSIS — M25612 Stiffness of left shoulder, not elsewhere classified: Secondary | ICD-10-CM | POA: Diagnosis not present

## 2018-02-15 ENCOUNTER — Telehealth: Payer: Self-pay

## 2018-02-15 NOTE — Telephone Encounter (Signed)
Pt son called requesting pt have an rx for tramadol for 90 days and 3 refills. He also mentioned that pt rheumatologist Dr. Jefm Bryant suggests that in her condition she take tramadol every 6 hours.   I spoke with Nira Conn and she said that we can send it in for twice daily as needed but Dr. Jefm Bryant would have to send in an rx for every 6 hours if pt would like that instead. Also we can only do a 90 day rx with 1 refill since it is a controlled substance.   I called back and spoke with pt directly and mentioned the above to her and she said she would only like to take tramadol twice daily. I told her at her next visit heather can do that for her because she has enough fills until next visit.

## 2018-02-16 ENCOUNTER — Telehealth: Payer: Self-pay

## 2018-02-16 ENCOUNTER — Other Ambulatory Visit: Payer: Self-pay

## 2018-02-16 DIAGNOSIS — M150 Primary generalized (osteo)arthritis: Secondary | ICD-10-CM

## 2018-02-16 DIAGNOSIS — M542 Cervicalgia: Secondary | ICD-10-CM | POA: Diagnosis not present

## 2018-02-16 MED ORDER — TRAMADOL HCL 50 MG PO TABS: ORAL_TABLET | ORAL | 1 refills | Status: DC

## 2018-02-16 NOTE — Telephone Encounter (Signed)
Spoke to [employer] and she approved refill and changed rx for tramadol to 1 tab twice a day, #60 with 1 refill.  rx was called into [place].  dbs

## 2018-02-22 DIAGNOSIS — M542 Cervicalgia: Secondary | ICD-10-CM | POA: Diagnosis not present

## 2018-02-27 DIAGNOSIS — M1612 Unilateral primary osteoarthritis, left hip: Secondary | ICD-10-CM | POA: Diagnosis not present

## 2018-02-27 DIAGNOSIS — M1712 Unilateral primary osteoarthritis, left knee: Secondary | ICD-10-CM | POA: Diagnosis not present

## 2018-02-27 DIAGNOSIS — M25552 Pain in left hip: Secondary | ICD-10-CM | POA: Diagnosis not present

## 2018-02-27 DIAGNOSIS — M25562 Pain in left knee: Secondary | ICD-10-CM | POA: Diagnosis not present

## 2018-03-01 DIAGNOSIS — M25512 Pain in left shoulder: Secondary | ICD-10-CM | POA: Diagnosis not present

## 2018-03-01 DIAGNOSIS — M25552 Pain in left hip: Secondary | ICD-10-CM | POA: Diagnosis not present

## 2018-03-01 DIAGNOSIS — M545 Low back pain: Secondary | ICD-10-CM | POA: Diagnosis not present

## 2018-03-01 DIAGNOSIS — G8929 Other chronic pain: Secondary | ICD-10-CM | POA: Diagnosis not present

## 2018-03-01 DIAGNOSIS — M25511 Pain in right shoulder: Secondary | ICD-10-CM | POA: Diagnosis not present

## 2018-03-01 DIAGNOSIS — M7062 Trochanteric bursitis, left hip: Secondary | ICD-10-CM | POA: Diagnosis not present

## 2018-03-15 ENCOUNTER — Encounter: Payer: Self-pay | Admitting: Nurse Practitioner

## 2018-03-15 ENCOUNTER — Ambulatory Visit (INDEPENDENT_AMBULATORY_CARE_PROVIDER_SITE_OTHER): Payer: Medicare Other | Admitting: Nurse Practitioner

## 2018-03-15 VITALS — BP 111/68 | HR 84 | Resp 16 | Ht 64.5 in | Wt 187.6 lb

## 2018-03-15 DIAGNOSIS — I10 Essential (primary) hypertension: Secondary | ICD-10-CM | POA: Diagnosis not present

## 2018-03-15 DIAGNOSIS — E1165 Type 2 diabetes mellitus with hyperglycemia: Secondary | ICD-10-CM

## 2018-03-15 DIAGNOSIS — M150 Primary generalized (osteo)arthritis: Secondary | ICD-10-CM | POA: Diagnosis not present

## 2018-03-15 DIAGNOSIS — M353 Polymyalgia rheumatica: Secondary | ICD-10-CM

## 2018-03-15 LAB — POCT GLYCOSYLATED HEMOGLOBIN (HGB A1C): HEMOGLOBIN A1C: 6.2 % — AB (ref 4.0–5.6)

## 2018-03-15 MED ORDER — TRAMADOL HCL 50 MG PO TABS: ORAL_TABLET | ORAL | 1 refills | Status: DC

## 2018-03-15 MED ORDER — TRAMADOL HCL 50 MG PO TABS: ORAL_TABLET | ORAL | 2 refills | Status: DC

## 2018-03-15 NOTE — Progress Notes (Signed)
[facility], [place] [address]  Internal MEDICINE  Office Visit Note  Patient Name: Stacy [name]  [id number]  [id number]  Date of Service: 04/08/2018   Pt is here for routine follow up.   Chief Complaint  Patient presents with  . Diabetes  . Osteoarthritis    The patient is here for routine follow up exam. Still having generalized arthritic pain, especially in the both shoulders. Does see rheumatology. Got cortisone injections in both shoulders and in her hip at her last visit with him. This does help but does not last for long.Started her on tramadol at night at her last visit. Has helped her to rest well at night. Rheumatologist suggested she get this two or three times per day. Increased dosing to twice daily. Has helped her to be more productive during the day. Does take tylenol in between tramadol doses to keep pain under control a little.  Had to stop celebrex, as she had noted intermittent GI bleeding.       Current Medication: Outpatient Encounter Medications as of 03/15/2018  Medication Sig Note  . acetaminophen (TYLENOL) 325 MG tablet Take 650 mg by mouth every 6 (six) hours as needed for pain.   . benzonatate (TESSALON) 200 MG capsule Take 200 mg by mouth 2 (two) times daily as needed for cough.   . Flaxseed, Linseed, (FLAX SEED OIL PO) Take by mouth.   . fluticasone (FLONASE) 50 MCG/ACT nasal spray as needed.  12/25/2014: Received from: External Pharmacy  . glucose blood test strip 1 each by Other route as needed for other. Use as instructed   . lisinopril (PRINIVIL,ZESTRIL) 10 MG tablet Take 10 mg by mouth every morning. tak 1/2 tablet qam.   . montelukast (SINGULAIR) 10 MG tablet Take 10 mg by mouth as needed.   . OXYGEN Inhale into the lungs as needed.   . predniSONE (DELTASONE) 5 MG tablet Take 5 mg by mouth daily.    . traMADol (ULTRAM) 50 MG tablet Take 1 tablet po TID prn pain.   . [DISCONTINUED] traMADol (ULTRAM) 50 MG tablet Take  1 tablet PO twice a day PRN   . [DISCONTINUED] traMADol (ULTRAM) 50 MG tablet Take 1 tablet po TID prn pain.    No facility-administered encounter medications on file as of 03/15/2018.     Surgical History: Past Surgical History:  Procedure Laterality Date  . BREAST BIOPSY Left 2006   +  . BREAST SURGERY Left 2006   lumpectomy  . CHOLECYSTECTOMY  1969  . COLON SURGERY  2004  . COLONOSCOPY  2014   Dr. Candace Cruise  . EYE SURGERY  2002  . FOOT SURGERY  1996  . HERNIA REPAIR  2013  . left breast biopsy   2011    Medical History: Past Medical History:  Diagnosis Date  . Arthritis 1940  . Asthma   . Bowel trouble   . Cancer [facility]) 2006   DCIS left breast  . Hypertension 1940  . Incisional hernia 2014  . Personal history of radiation therapy   . Polymyalgia ([place])   . Thyroid disorder   . Vitiligo 2012    Family History: Family History  Problem Relation Age of Onset  . Heart disease Mother   . Emphysema Mother   . Arthritis Father   . Cancer Father   . Breast cancer Neg Hx     Social History   Socioeconomic History  . Marital status: Single  Spouse name: Not on file  . Number of children: Not on file  . Years of education: Not on file  . Highest education level: Not on file  Occupational History  . Not on file  Social Needs  . Financial resource strain: Not on file  . Food insecurity:    Worry: Not on file    Inability: Not on file  . Transportation needs:    Medical: Not on file    Non-medical: Not on file  Tobacco Use  . Smoking status: Never Smoker  . Smokeless tobacco: Never Used  Substance and Sexual Activity  . Alcohol use: No  . Drug use: No  . Sexual activity: Not on file  Lifestyle  . Physical activity:    Days per week: Not on file    Minutes per session: Not on file  . Stress: Not on file  Relationships  . Social connections:    Talks on phone: Not on file    Gets together: Not on file    Attends religious service: Not on file    Active  member of club or organization: Not on file    Attends meetings of clubs or organizations: Not on file    Relationship status: Not on file  . Intimate partner violence:    Fear of current or ex partner: Not on file    Emotionally abused: Not on file    Physically abused: Not on file    Forced sexual activity: Not on file  Other Topics Concern  . Not on file  Social History Narrative  . Not on file      Review of Systems  Constitutional: Negative for activity change, fatigue and unexpected weight change.  HENT: Negative for congestion, rhinorrhea and sore throat.   Eyes: Negative.   Respiratory: Positive for shortness of breath. Negative for chest tightness.   Cardiovascular: Negative for chest pain and palpitations.  Gastrointestinal: Negative for nausea and vomiting.  Endocrine:       Well controlled blood sugars.   Genitourinary: Negative.   Musculoskeletal: Positive for arthralgias, gait problem, joint swelling and myalgias.       Patient given rx for tramadol at her last visit. Did very wel lwith it without negative side effects. Rheumatologist suggested she take this more often due to benefits she got.   Skin: Negative for rash.  Allergic/Immunologic: Negative.   Neurological: Positive for weakness. Negative for headaches.  Hematological: Negative for adenopathy.  Psychiatric/Behavioral: Negative for dysphoric mood. The patient is not nervous/anxious.     Today's Vitals   03/15/18 1408  BP: 111/68  Pulse: 84  Resp: 16  SpO2: 99%  Weight: 187 lb 9.6 oz (85.1 kg)  Height: 5' 4.5" (1.638 m)    Physical Exam  Constitutional: She is oriented to person, place, and time. She appears well-developed and well-nourished. No distress.  HENT:  Head: Normocephalic and atraumatic.  Nose: Nose normal.  Mouth/Throat: Oropharynx is clear and moist. No oropharyngeal exudate.  Eyes: Pupils are equal, round, and reactive to light. Conjunctivae and EOM are normal.  Neck: Normal  range of motion. Neck supple. No JVD present. No tracheal deviation present. No thyromegaly present.  Cardiovascular: Normal rate, regular rhythm and normal heart sounds. Exam reveals no gallop and no friction rub.  No murmur heard. Pulmonary/Chest: Effort normal and breath sounds normal. No respiratory distress. She has no wheezes. She has no rales. She exhibits no tenderness.  Abdominal: Soft. Bowel sounds are normal. There is  no tenderness.  Musculoskeletal:  There is generalized joint pain with point tenderness present at many joints. She does have moderate arthritic changes present in multiple joints.   Lymphadenopathy:    She has no cervical adenopathy.  Neurological: She is alert and oriented to person, place, and time. No cranial nerve deficit.  Skin: Skin is warm and dry. She is not diaphoretic.  Psychiatric: She has a normal mood and affect. Her behavior is normal. Judgment and thought content normal.  Nursing note and vitals reviewed.  Assessment/Plan: 1. Type 2 diabetes mellitus with hyperglycemia, unspecified whether long term insulin use (HCC) - POCT HgB A1C 6.2 today. Continue diabetic medication as prescribed   2. Primary generalized (osteo)arthritis Has done well with trial of tramadol. Will increase dose to TID as needed. Will send new prescription to her pharmacy. Reviewed risk factors and possible side effects of taking narcotic pain medication.  - traMADol (ULTRAM) 50 MG tablet; Take 1 tablet po TID prn pain.  Dispense: 90 tablet; Refill: 2  3. Polymyalgia rheumatica (HCC) Regular visits with rheumatology as scheduled.   4. Essential hypertension Stable. Continue bp medication as prescribed.   General Counseling: Stacy [name] understanding of the findings of todays visit and agrees with plan of treatment. I have discussed any further diagnostic evaluation that may be needed or ordered today. We also reviewed her medications today. she has been encouraged to call  the office with any questions or concerns that should arise related to todays visit.    Counseling:  Diabetes Counseling:  1. Addition of ACE inh/ ARB'S for nephroprotection. Microalbumin is updated  2. Diabetic foot care, prevention of complications. Podiatry consult 3. Exercise and lose weight.  4. Diabetic eye examination, Diabetic eye exam is updated  5. Monitor blood sugar closlely. nutrition counseling.  6. Sign and symptoms of hypoglycemia including shaking sweating,confusion and headaches.   Reviewed risks and possible side effects associated with taking opiates and benzodiazepines. Combination of these could cause dizziness and drowsiness. Advised patient not to drive or operate machinery when taking these medications, as patient's and other's life can be at risk and will have consequences. Patient verbalized understanding in this matter.   This patient was seen by Leretha Pol FNP Collaboration with Dr [name] as a part of collaborative care agreement  Orders Placed This Encounter  Procedures  . POCT HgB A1C    Meds ordered this encounter  Medications  . DISCONTD: traMADol (ULTRAM) 50 MG tablet    Sig: Take 1 tablet po TID prn pain.    Dispense:  60 tablet    Refill:  1    This is not new prescription. Patient has chronic and severe osteo and rheumatoid arthritis.    Order Specific Question:   Supervising Provider    Answer:   [name] [[id number]]  . traMADol (ULTRAM) 50 MG tablet    Sig: Take 1 tablet po TID prn pain.    Dispense:  90 tablet    Refill:  2    This is not new prescription. Patient has chronic and severe osteo and rheumatoid arthritis.    Order Specific Question:   Supervising Provider    Answer:   [name] [[id number]]    Time spent: 73 Minutes     Dr [name] Internal medicine

## 2018-03-27 ENCOUNTER — Ambulatory Visit: Payer: Medicare Other

## 2018-04-05 ENCOUNTER — Ambulatory Visit: Payer: Medicare Other | Admitting: General Surgery

## 2018-04-08 ENCOUNTER — Encounter: Payer: Self-pay | Admitting: Nurse Practitioner

## 2018-04-10 ENCOUNTER — Ambulatory Visit (INDEPENDENT_AMBULATORY_CARE_PROVIDER_SITE_OTHER): Payer: Medicare Other | Admitting: Internal Medicine

## 2018-04-10 ENCOUNTER — Encounter: Payer: Self-pay | Admitting: Internal Medicine

## 2018-04-10 VITALS — BP 114/60 | HR 88 | Resp 16 | Ht 64.5 in | Wt 186.0 lb

## 2018-04-10 DIAGNOSIS — R0602 Shortness of breath: Secondary | ICD-10-CM | POA: Diagnosis not present

## 2018-04-10 DIAGNOSIS — G894 Chronic pain syndrome: Secondary | ICD-10-CM

## 2018-04-10 DIAGNOSIS — J301 Allergic rhinitis due to pollen: Secondary | ICD-10-CM | POA: Diagnosis not present

## 2018-04-10 DIAGNOSIS — M159 Polyosteoarthritis, unspecified: Secondary | ICD-10-CM | POA: Diagnosis not present

## 2018-04-10 DIAGNOSIS — J449 Chronic obstructive pulmonary disease, unspecified: Secondary | ICD-10-CM

## 2018-04-10 NOTE — Progress Notes (Signed)
[facility] [place], [place] [address]  Pulmonary Sleep Medicine   Office Visit Note  Patient Name: Stacy [name] DOB: 11/07/1926 MRN [id number]  Date of Service: 04/10/2018  Complaints/HPI:  She is here for follow-up doing well.  She states that her breathing has been at baseline.  No cough no congestion.  No admissions to the hospital.  The only main complaint she had is severe pain.  She has degenerative arthritis in her neck spine shoulders.  She sees Orthopedic surgery for that.  She is on medical management periodically gets shots.  Breathing wise since had on and off little bit of cough but nothing more than her usual self.  Denies having any chest pain no palpitations.  She had a spirometry done today which actually looks good.  I reviewed her previous spirometry is and there comfortable.  ROS  General: (-) fever, (-) chills, (-) night sweats, (-) weakness Skin: (-) rashes, (-) itching,. Eyes: (-) visual changes, (-) redness, (-) itching. Nose and Sinuses: (-) nasal stuffiness or itchiness, (-) postnasal drip, (-) nosebleeds, (-) sinus trouble. Mouth and Throat: (-) sore throat, (-) hoarseness. Neck: (-) swollen glands, (-) enlarged thyroid, (-) neck pain. Respiratory: - cough, (-) bloody sputum, - shortness of breath, - wheezing. Cardiovascular: - ankle swelling, (-) chest pain. Lymphatic: (-) lymph node enlargement. Neurologic: (-) numbness, (-) tingling. Psychiatric: (-) anxiety, (-) depression   Current Medication: Outpatient Encounter Medications as of 04/10/2018  Medication Sig Note  . acetaminophen (TYLENOL) 325 MG tablet Take 650 mg by mouth every 6 (six) hours as needed for pain.   . benzonatate (TESSALON) 200 MG capsule Take 200 mg by mouth 2 (two) times daily as needed for cough.   . Flaxseed, Linseed, (FLAX SEED OIL PO) Take by mouth.   . fluticasone (FLONASE) 50 MCG/ACT nasal spray as needed.  12/25/2014: Received from: External Pharmacy   . glucose blood test strip 1 each by Other route as needed for other. Use as instructed   . lisinopril (PRINIVIL,ZESTRIL) 10 MG tablet Take 10 mg by mouth every morning. tak 1/2 tablet qam.   . montelukast (SINGULAIR) 10 MG tablet Take 10 mg by mouth as needed.   . OXYGEN Inhale into the lungs as needed.   . predniSONE (DELTASONE) 5 MG tablet Take 5 mg by mouth daily.    . traMADol (ULTRAM) 50 MG tablet Take 1 tablet po TID prn pain.    No facility-administered encounter medications on file as of 04/10/2018.     Surgical History: Past Surgical History:  Procedure Laterality Date  . BREAST BIOPSY Left 2006   +  . BREAST SURGERY Left 2006   lumpectomy  . CHOLECYSTECTOMY  1969  . COLON SURGERY  2004  . COLONOSCOPY  2014   Dr. Candace Cruise  . EYE SURGERY  2002  . FOOT SURGERY  1996  . HERNIA REPAIR  2013  . left breast biopsy   2011    Medical History: Past Medical History:  Diagnosis Date  . Arthritis 1940  . Asthma   . Bowel trouble   . Cancer [facility]) 2006   DCIS left breast  . Hypertension 1940  . Incisional hernia 2014  . Personal history of radiation therapy   . Polymyalgia ([place])   . Thyroid disorder   . Vitiligo 2012    Family History: Family History  Problem Relation Age of Onset  . Heart disease Mother   . Emphysema Mother   .  Arthritis Father   . Cancer Father   . Breast cancer Neg Hx     Social History: Social History   Socioeconomic History  . Marital status: Single    Spouse name: Not on file  . Number of children: Not on file  . Years of education: Not on file  . Highest education level: Not on file  Occupational History  . Not on file  Social Needs  . Financial resource strain: Not on file  . Food insecurity:    Worry: Not on file    Inability: Not on file  . Transportation needs:    Medical: Not on file    Non-medical: Not on file  Tobacco Use  . Smoking status: Never Smoker  . Smokeless tobacco: Never Used  Substance and Sexual Activity  .  Alcohol use: No  . Drug use: No  . Sexual activity: Not on file  Lifestyle  . Physical activity:    Days per week: Not on file    Minutes per session: Not on file  . Stress: Not on file  Relationships  . Social connections:    Talks on phone: Not on file    Gets together: Not on file    Attends religious service: Not on file    Active member of club or organization: Not on file    Attends meetings of clubs or organizations: Not on file    Relationship status: Not on file  . Intimate partner violence:    Fear of current or ex partner: Not on file    Emotionally abused: Not on file    Physically abused: Not on file    Forced sexual activity: Not on file  Other Topics Concern  . Not on file  Social History Narrative  . Not on file    Vital Signs: Blood pressure 114/60, pulse 88, resp. rate 16, height 5' 4.5" (1.638 m), weight 186 lb (84.4 kg), SpO2 98 %.  Examination: General Appearance: The patient is well-developed, well-nourished, and in no distress. Skin: Gross inspection of skin unremarkable. Head: normocephalic, no gross deformities. Eyes: no gross deformities noted. ENT: ears appear grossly normal no exudates. Neck: Supple. No thyromegaly. No LAD. Respiratory: no rhonchi noted. Cardiovascular: Normal S1 and S2 without murmur or rub. Extremities: No cyanosis. pulses are equal. Neurologic: Alert and oriented. No involuntary movements.  LABS: Recent Results (from the past 2160 hour(s))  POCT HgB A1C     Status: Abnormal   Collection Time: 03/15/18  2:20 PM  Result Value Ref Range   Hemoglobin A1C 6.2 (A) 4.0 - 5.6 %   HbA1c, POC (prediabetic range)  5.7 - 6.4 %   HbA1c, POC (controlled diabetic range)  0.0 - 7.0 %    Radiology: Mm Screening Breast Tomo Bilateral  Result Date: 12/15/2016 CLINICAL DATA:  Screening. EXAM: 2D DIGITAL SCREENING BILATERAL MAMMOGRAM WITH CAD AND ADJUNCT TOMO COMPARISON:  Previous exam(s). ACR Breast Density Category b: There are  scattered areas of fibroglandular density. FINDINGS: There are no findings suspicious for malignancy. Images were processed with CAD. IMPRESSION: No mammographic evidence of malignancy. A result letter of this screening mammogram will be mailed directly to the patient. RECOMMENDATION: Screening mammogram in one year. (Code:SM-B-01Y) BI-RADS CATEGORY  1: Negative. Electronically Signed   By: Pamelia Hoit M.D.   On: 12/15/2016 18:02    No results found.  No results found.    Assessment and Plan: Patient Active Problem List   Diagnosis Date Noted  .  Simple chronic bronchitis ([place]) 12/31/2017  . Type 2 diabetes mellitus with hyperglycemia ([place]) 12/31/2017  . Congestive heart failure ([place]) 10/20/2017  . Primary generalized (osteo)arthritis 09/21/2017  . Vitamin B12 deficiency anemia, unspecified 09/21/2017  . Chronic kidney disease, unspecified 09/21/2017  . Vitamin D deficiency, unspecified 09/21/2017  . Chronic obstructive pulmonary disease with acute lower respiratory infection ([place]) 09/21/2017  . Unspecified hearing loss, bilateral 09/21/2017  . Age-related osteoporosis without current pathological fracture 09/21/2017  . Other amnesia 09/21/2017  . Cough 09/21/2017  . Allergic rhinitis, unspecified 09/21/2017  . Type 2 diabetes mellitus without complications ([place]) 63/14/9702  . Other seborrheic dermatitis 09/21/2017  . Unspecified cervical disc disorder, mid-cervical region, unspecified level 09/21/2017  . Neuromuscular dysfunction of bladder, unspecified 09/21/2017  . Allergic rhinitis due to pollen 09/21/2017  . Other primary ovarian failure 09/21/2017  . Shortness of breath 09/21/2017  . Acute bronchitis, unspecified 09/21/2017  . Benign and innocent cardiac murmurs 09/21/2017  . Dysuria 09/21/2017  . Retention of urine, unspecified 09/21/2017  . Unspecified urinary incontinence 09/21/2017  . Mixed incontinence 09/21/2017  . Insomnia, unspecified 09/21/2017  . Dizziness and  giddiness 09/21/2017  . Polymyalgia rheumatica ([place]) 09/21/2017  . Low back pain 09/21/2017  . Hypoxemia 09/21/2017  . Spondylosis without myelopathy or radiculopathy, lumbosacral region 09/21/2017  . Pain in unspecified knee 09/21/2017  . Mixed hyperlipidemia 09/21/2017  . Cardiomegaly 09/21/2017  . Abnormality of gait and mobility 09/21/2017  . Personal history of malignant neoplasm of breast 04/15/2013  . History of breast cancer 12/24/2012  . Incisional hernia, without obstruction or gangrene 12/24/2012  . Essential hypertension   . Vitiligo   . Arthritis   . Thyroid disorder   . Bowel trouble     1. COPD  Compensated will continue with present management.  She currently is off inhalers.  She failed patient does not really them.  The likely reason is that she is on chronic prednisone for her arthritis which indirectly is helping her breathing. 2. DJD  On chronic pain management as well as on chronic steroids.  She takes tramadol for her pain which does seem to help 3. Chronic Pain syndrome she takes tramadol as well as prednisone to help for her inflammatory arthritis 4. Allergic Rhinitis well controlled will continue with present management.  General Counseling: I have discussed the findings of the evaluation and examination with [facility].  I have also discussed any further diagnostic evaluation thatmay be needed or ordered today. Manvir verbalizes understanding of the findings of todays visit. We also reviewed her medications today and discussed drug interactions and side effects including but not limited excessive drowsiness and altered mental states. We also discussed that there is always a risk not just to her but also people around her. she has been encouraged to call the office with any questions or concerns that should arise related to todays visit.    Time spent: 51min  I have personally obtained a history, examined the patient, evaluated laboratory and imaging results,  formulated the assessment and plan and placed orders.    Allyne Gee, MD [facility] Pulmonary and Critical Care Sleep medicine

## 2018-04-10 NOTE — Patient Instructions (Signed)

## 2018-04-13 ENCOUNTER — Telehealth: Payer: Self-pay

## 2018-04-13 NOTE — Telephone Encounter (Signed)
Can you check with her pharmacy. At visit 03/15/2018, I gave rx #90 with 2 refills to her pharmacy. Thanks.

## 2018-04-13 NOTE — Telephone Encounter (Signed)
Awesome thanks!

## 2018-05-23 ENCOUNTER — Ambulatory Visit: Payer: Self-pay | Admitting: Internal Medicine

## 2018-05-25 ENCOUNTER — Other Ambulatory Visit: Payer: Self-pay | Admitting: Nurse Practitioner

## 2018-05-25 ENCOUNTER — Telehealth: Payer: Self-pay | Admitting: Nurse Practitioner

## 2018-05-25 DIAGNOSIS — I10 Essential (primary) hypertension: Secondary | ICD-10-CM

## 2018-05-25 MED ORDER — LISINOPRIL 5 MG PO TABS: 5.0000 mg | ORAL_TABLET | Freq: Every day | ORAL | 4 refills | Status: DC

## 2018-05-25 NOTE — Progress Notes (Signed)
Changed lisinopril rx to 5mg  daily and sent new rx to [place].

## 2018-05-25 NOTE — Telephone Encounter (Signed)
Stacy [name] son called asking if we could change the lisinopril 10 mg to 5mg  having a difficult time cutting the pill in half.

## 2018-05-25 NOTE — Telephone Encounter (Signed)
Changed lisinopril rx to 5mg  daily and sent new rx to [place].

## 2018-05-30 DIAGNOSIS — B351 Tinea unguium: Secondary | ICD-10-CM | POA: Diagnosis not present

## 2018-05-30 DIAGNOSIS — M79675 Pain in left toe(s): Secondary | ICD-10-CM | POA: Diagnosis not present

## 2018-05-30 DIAGNOSIS — M79674 Pain in right toe(s): Secondary | ICD-10-CM | POA: Diagnosis not present

## 2018-06-01 ENCOUNTER — Encounter: Payer: Self-pay | Admitting: Nurse Practitioner

## 2018-06-06 ENCOUNTER — Ambulatory Visit (INDEPENDENT_AMBULATORY_CARE_PROVIDER_SITE_OTHER): Payer: Medicare Other | Admitting: Internal Medicine

## 2018-06-06 ENCOUNTER — Ambulatory Visit: Payer: Self-pay | Admitting: Internal Medicine

## 2018-06-06 DIAGNOSIS — R0602 Shortness of breath: Secondary | ICD-10-CM | POA: Diagnosis not present

## 2018-06-06 LAB — PULMONARY FUNCTION TEST

## 2018-06-11 NOTE — Procedures (Signed)
[facility] [address] [place], [address]  DATE OF SERVICE: June 06, 2018  Complete Pulmonary Function Testing Interpretation:  FINDINGS:  Forced vital capacity is normal the FEV1 is 1.93 L which is 105% of predicted and is in the normal range.  The FEV1 FVC ratio is mildly decreased.  Postbronchodilator there is no significant change in the FEV1 however clinical improvement may still occur in the absence of spirometric improvement.  Total lung capacity is increased as measured by the body box.  Residual volume is increased residual volume total lung capacity ratio is increased the FRC is increased.  The DLCO is decreased however normal when corrected for alveolar volume.  IMPRESSION:  The spirometry and pulmonary function study is basically within normal limits.  The DLCO is decreased however is normal when corrected for alveolar volume clinical correlation is recommended.  Causes of reduced DLCO include ongoing smoking interstitial lung disease pulmonary fibrosis as well as emphysema clinical correlation would be recommended.  Allyne Gee, MD [facility] Pulmonary Critical Care Medicine Sleep Medicine

## 2018-07-05 ENCOUNTER — Ambulatory Visit (INDEPENDENT_AMBULATORY_CARE_PROVIDER_SITE_OTHER): Payer: Medicare Other | Admitting: Nurse Practitioner

## 2018-07-05 ENCOUNTER — Encounter: Payer: Self-pay | Admitting: Nurse Practitioner

## 2018-07-05 VITALS — BP 140/80 | HR 75 | Temp 97.5°F | Resp 16 | Ht 64.0 in | Wt 183.6 lb

## 2018-07-05 DIAGNOSIS — E1165 Type 2 diabetes mellitus with hyperglycemia: Secondary | ICD-10-CM | POA: Diagnosis not present

## 2018-07-05 DIAGNOSIS — Z0001 Encounter for general adult medical examination with abnormal findings: Secondary | ICD-10-CM | POA: Diagnosis not present

## 2018-07-05 DIAGNOSIS — I10 Essential (primary) hypertension: Secondary | ICD-10-CM

## 2018-07-05 DIAGNOSIS — J410 Simple chronic bronchitis: Secondary | ICD-10-CM | POA: Diagnosis not present

## 2018-07-05 DIAGNOSIS — M79642 Pain in left hand: Secondary | ICD-10-CM | POA: Diagnosis not present

## 2018-07-05 DIAGNOSIS — M79641 Pain in right hand: Secondary | ICD-10-CM | POA: Diagnosis not present

## 2018-07-05 DIAGNOSIS — D485 Neoplasm of uncertain behavior of skin: Secondary | ICD-10-CM

## 2018-07-05 DIAGNOSIS — R300 Dysuria: Secondary | ICD-10-CM | POA: Diagnosis not present

## 2018-07-05 DIAGNOSIS — M150 Primary generalized (osteo)arthritis: Secondary | ICD-10-CM

## 2018-07-05 DIAGNOSIS — Z112 Encounter for screening for other bacterial diseases: Secondary | ICD-10-CM | POA: Diagnosis not present

## 2018-07-05 DIAGNOSIS — N390 Urinary tract infection, site not specified: Secondary | ICD-10-CM | POA: Diagnosis not present

## 2018-07-05 LAB — POCT GLYCOSYLATED HEMOGLOBIN (HGB A1C): Hemoglobin A1C: 5.6 % (ref 4.0–5.6)

## 2018-07-05 MED ORDER — TRAMADOL HCL 50 MG PO TABS: ORAL_TABLET | ORAL | 2 refills | Status: DC

## 2018-07-05 NOTE — Progress Notes (Signed)
[facility] [place], [place] [address]  Internal MEDICINE  Office Visit Note  Patient Name: Stacy [name]  [id number]  [id number]  Date of Service: 07/05/2018   Pt is here for routine health maintenance examination  Chief Complaint  Patient presents with  . Hypertension  . COPD  . Diabetes     Still having generalized arthritic pain, especially in the both shoulders. Does see rheumatology.   Currently taking tramadol twice daily, but is prescribed as three times daily as needed. Unable to tell if this is making a difference any more. Joint of fingers. Does take tylenol in between tramadol doses to keep pain under control a little. Has had cortisone injections into her shoulder and her hips which will help some, but does not last for long. She has bursitis in the shoulders and spurring in the hips and knees. Has tried multiple NSAIDs, but all have caused severe GERD or GI bleeding. Unable to tolerate these medications at all. She does have appointment with her rheumatologist on Monday for further evaluation and treatment.  There are two spots on her left hand which are concerning to the patient. They are dark in color, raised, and irregular in shape.   Diabetes  Pertinent negatives for hypoglycemia include no headaches or nervousness/anxiousness. Associated symptoms include weakness. Pertinent negatives for diabetes include no chest pain, no fatigue, no polydipsia, no polyphagia and no polyuria.     Current Medication: Outpatient Encounter Medications as of 07/05/2018  Medication Sig Note  . acetaminophen (TYLENOL) 325 MG tablet Take 650 mg by mouth every 6 (six) hours as needed for pain.   . benzonatate (TESSALON) 200 MG capsule Take 200 mg by mouth 2 (two) times daily as needed for cough.   . Flaxseed, Linseed, (FLAX SEED OIL PO) Take by mouth.   . fluticasone (FLONASE) 50 MCG/ACT nasal spray as needed.  12/25/2014: Received from: External Pharmacy  .  glucose blood test strip 1 each by Other route as needed for other. Use as instructed   . lisinopril (PRINIVIL,ZESTRIL) 5 MG tablet Take 1 tablet (5 mg total) by mouth daily.   . montelukast (SINGULAIR) 10 MG tablet Take 10 mg by mouth as needed.   . OXYGEN Inhale into the lungs as needed.   . predniSONE (DELTASONE) 5 MG tablet Take 5 mg by mouth daily.    . traMADol (ULTRAM) 50 MG tablet Take 1 tablet po TID prn pain.   . [DISCONTINUED] traMADol (ULTRAM) 50 MG tablet Take 1 tablet po TID prn pain.    No facility-administered encounter medications on file as of 07/05/2018.     Surgical History: Past Surgical History:  Procedure Laterality Date  . BREAST BIOPSY Left 2006   +  . BREAST SURGERY Left 2006   lumpectomy  . CHOLECYSTECTOMY  1969  . COLON SURGERY  2004  . COLONOSCOPY  2014   Dr. Candace Cruise  . EYE SURGERY  2002  . FOOT SURGERY  1996  . HERNIA REPAIR  2013  . left breast biopsy   2011    Medical History: Past Medical History:  Diagnosis Date  . Arthritis 1940  . Asthma   . Bowel trouble   . Cancer [facility]) 2006   DCIS left breast  . Hypertension 1940  . Incisional hernia 2014  . Personal history of radiation therapy   . Polymyalgia ([place])   . Thyroid disorder   . Vitiligo 2012    Family History: Family History  Problem Relation Age of Onset  . Heart disease Mother   . Emphysema Mother   . Arthritis Father   . Cancer Father   . Breast cancer Neg Hx       Review of Systems  Constitutional: Negative for activity change, fatigue and unexpected weight change.  HENT: Negative for congestion, rhinorrhea and sore throat.   Eyes: Negative.   Respiratory: Negative for chest tightness, shortness of breath and wheezing.   Cardiovascular: Negative for chest pain and palpitations.  Gastrointestinal: Negative for nausea and vomiting.  Endocrine: Negative for cold intolerance, heat intolerance, polydipsia, polyphagia and polyuria.       Well controlled blood sugars.     Genitourinary: Negative.   Musculoskeletal: Positive for arthralgias, gait problem, joint swelling and myalgias.       Patient given rx for tramadol at her last visit. Did very wel lwith it without negative side effects. Rheumatologist suggested she take this more often due to benefits she got.   Skin: Negative for rash.       Two spots on the left hand of concern. Dark in color, raised, and irregular in shape.   Allergic/Immunologic: Negative for environmental allergies.  Neurological: Positive for weakness. Negative for headaches.  Hematological: Negative for adenopathy.  Psychiatric/Behavioral: Negative for dysphoric mood. The patient is not nervous/anxious.      Today's Vitals   07/05/18 1417  BP: 140/80  Pulse: 75  Resp: 16  Temp: (!) 97.5 F (36.4 C)  SpO2: 96%  Weight: 183 lb 9.6 oz (83.3 kg)  Height: 5\' 4"  (1.626 m)    Physical Exam  Constitutional: She is oriented to person, place, and time. She appears well-developed and well-nourished. No distress.  HENT:  Head: Normocephalic and atraumatic.  Nose: Nose normal.  Mouth/Throat: Oropharynx is clear and moist. No oropharyngeal exudate.  Eyes: Pupils are equal, round, and reactive to light. Conjunctivae and EOM are normal.  Neck: Normal range of motion. Neck supple. No JVD present. No tracheal deviation present. No thyromegaly present.  Cardiovascular: Normal rate, regular rhythm, normal heart sounds and intact distal pulses. Exam reveals no gallop and no friction rub.  No murmur heard. Pulses:      Dorsalis pedis pulses are 1+ on the right side, and 1+ on the left side.       Posterior tibial pulses are 1+ on the right side, and 1+ on the left side.  Pulmonary/Chest: Effort normal and breath sounds normal. No respiratory distress. She has no wheezes. She has no rales. She exhibits no tenderness.  Abdominal: Soft. Bowel sounds are normal. There is no tenderness.  Musculoskeletal:       Right foot: There is normal range  of motion and no deformity.       Left foot: There is normal range of motion and no deformity.  There is generalized joint pain with point tenderness present at many joints. She does have moderate arthritic changes present in multiple joints.   Feet:  Right Foot:  Protective Sensation: 7 sites tested. 7 sites sensed.  Left Foot:  Protective Sensation: 7 sites tested. 7 sites sensed.  Lymphadenopathy:    She has no cervical adenopathy.  Neurological: She is alert and oriented to person, place, and time. No cranial nerve deficit.  Skin: Skin is warm and dry. Capillary refill takes less than 2 seconds. She is not diaphoretic.     Psychiatric: She has a normal mood and affect. Her behavior is normal. Judgment and thought content  normal.  Nursing note and vitals reviewed.  Depression screen [facility] 2/9 07/05/2018 04/10/2018 03/15/2018 12/11/2017 09/21/2017  Decreased Interest 0 0 0 0 0  Down, Depressed, Hopeless 0 0 0 0 0  PHQ - 2 Score 0 0 0 0 0    Functional Status Survey: Is the patient deaf or have difficulty hearing?: Yes Does the patient have difficulty seeing, even when wearing glasses/contacts?: No Does the patient have difficulty concentrating, remembering, or making decisions?: No Does the patient have difficulty walking or climbing stairs?: Yes Does the patient have difficulty dressing or bathing?: No Does the patient have difficulty doing errands alone such as visiting a doctor's office or shopping?: Yes  MMSE - [place] Exam 07/05/2018  Orientation to time 5  Orientation to Place 5  Registration 3  Attention/ Calculation 5  Recall 3  Language- name 2 objects 2  Language- repeat 1  Language- follow 3 step command 3  Language- read & follow direction 1  Write a sentence 1  Copy design 1  Total score 30    Fall Risk  07/05/2018 04/10/2018 03/15/2018 12/11/2017 09/21/2017  Falls in the past year? No No No No No  Comment - - - - -  Number falls in past yr: - - - - -   Comment - - - - -  Injury with Fall? - - - - -     LABS: Recent Results (from the past 2160 hour(s))  Pulmonary function test     Status: None   Collection Time: 06/06/18 12:45 PM  Result Value Ref Range   FEV1     FVC     FEV1/FVC     TLC     DLCO    POCT HgB A1C     Status: None   Collection Time: 07/05/18  2:38 PM  Result Value Ref Range   Hemoglobin A1C 5.6 4.0 - 5.6 %   HbA1c POC (<> result, manual entry)     HbA1c, POC (prediabetic range)     HbA1c, POC (controlled diabetic range)      Assessment/Plan: 1. Encounter for general adult medical examination with abnormal findings Annual health maintenance exam today - UA/M w/rflx Culture, Routine  2. Uncontrolled type 2 diabetes mellitus with hyperglycemia (HCC) - POCT HgB A1C 5.6 today. Continue diabetic medication as prescribed. Referral for diabetic eye exam today - Ambulatory referral to Ophthalmology  3. Primary generalized (osteo)arthritis May continue tramadol 50mg  up to three times daily if needed. Appointment with rheumatology upcoming on Monday. Consider adding gabapentin to help with neuropathic pain . - traMADol (ULTRAM) 50 MG tablet; Take 1 tablet po TID prn pain.  Dispense: 90 tablet; Refill: 2  4. Essential hypertension Stable. Continue bp  medication as prescribed  5. Neoplasm of uncertain behavior of skin of hand Two spots on her hand of concern. Will refer to dermatology for further evaluation and treatment.  - Ambulatory referral to Dermatology  6. Simple chronic bronchitis ([place]) Continue inhalers and respiratory medication as prescribed   General Counseling: Kinga verbalizes understanding of the findings of todays visit and agrees with plan of treatment. I have discussed any further diagnostic evaluation that may be needed or ordered today. We also reviewed her medications today. she has been encouraged to call the office with any questions or concerns that should arise related to todays  visit.    Counseling:  Diabetes Counseling:  1. Addition of ACE inh/ ARB'S for nephroprotection. Microalbumin is updated  2.  Diabetic foot care, prevention of complications. Podiatry consult 3. Exercise and lose weight.  4. Diabetic eye examination, Diabetic eye exam is updated  5. Monitor blood sugar closlely. nutrition counseling.  6. Sign and symptoms of hypoglycemia including shaking sweating,confusion and headaches.  This patient was seen by Leretha Pol FNP Collaboration with Dr [name] as a part of collaborative care agreement  Orders Placed This Encounter  Procedures  . UA/M w/rflx Culture, Routine  . Ambulatory referral to Ophthalmology  . Ambulatory referral to Dermatology  . POCT HgB A1C    Meds ordered this encounter  Medications  . traMADol (ULTRAM) 50 MG tablet    Sig: Take 1 tablet po TID prn pain.    Dispense:  90 tablet    Refill:  2    This is not new prescription. Patient has chronic and severe osteo and rheumatoid arthritis.    Order Specific Question:   Supervising Provider    Answer:   [name] [[id number]]    Time spent: [place], MD  Internal Medicine

## 2018-07-07 LAB — URINE CULTURE, REFLEX

## 2018-07-07 LAB — UA/M W/RFLX CULTURE, ROUTINE
Bilirubin, UA: NEGATIVE
GLUCOSE, UA: NEGATIVE
NITRITE UA: NEGATIVE
PH UA: 5 (ref 5.0–7.5)
RBC, UA: NEGATIVE
SPEC GRAV UA: 1.029 (ref 1.005–1.030)
Urobilinogen, Ur: 1 mg/dL (ref 0.2–1.0)

## 2018-07-07 LAB — MICROSCOPIC EXAMINATION

## 2018-07-09 DIAGNOSIS — G8929 Other chronic pain: Secondary | ICD-10-CM | POA: Diagnosis not present

## 2018-07-09 DIAGNOSIS — M150 Primary generalized (osteo)arthritis: Secondary | ICD-10-CM | POA: Diagnosis not present

## 2018-07-09 DIAGNOSIS — M25512 Pain in left shoulder: Secondary | ICD-10-CM | POA: Diagnosis not present

## 2018-07-09 DIAGNOSIS — M1990 Unspecified osteoarthritis, unspecified site: Secondary | ICD-10-CM | POA: Diagnosis not present

## 2018-07-09 DIAGNOSIS — M25511 Pain in right shoulder: Secondary | ICD-10-CM | POA: Diagnosis not present

## 2018-07-12 ENCOUNTER — Other Ambulatory Visit: Payer: Self-pay | Admitting: Internal Medicine

## 2018-07-16 ENCOUNTER — Encounter: Payer: Self-pay | Admitting: Nurse Practitioner

## 2018-07-16 NOTE — Telephone Encounter (Signed)
Hey I made referral for her on 10/10 to see [place] dermatology. Can you check the status of this for me? thanks

## 2018-07-18 ENCOUNTER — Other Ambulatory Visit: Payer: Self-pay | Admitting: Nurse Practitioner

## 2018-07-18 MED ORDER — GLUCOSE BLOOD VI STRP: ORAL_STRIP | 25 refills | Status: DC

## 2018-07-20 ENCOUNTER — Other Ambulatory Visit: Payer: Self-pay | Admitting: Nurse Practitioner

## 2018-07-20 ENCOUNTER — Telehealth: Payer: Self-pay | Admitting: Nurse Practitioner

## 2018-07-20 DIAGNOSIS — M353 Polymyalgia rheumatica: Secondary | ICD-10-CM

## 2018-07-20 MED ORDER — GABAPENTIN 100 MG PO CAPS: 100.0000 mg | ORAL_CAPSULE | Freq: Two times a day (BID) | ORAL | 2 refills | Status: DC

## 2018-07-20 NOTE — Telephone Encounter (Signed)
As discussed with patient and her daughter at last visit, will start gabapentin. Start with 1 capsule at night to start. If tolerated well and without negative side effects, may add a second dose in the afternoon. Prescription given for twice daily, but want to start slow. Prescription sent to her pharmacy.

## 2018-07-20 NOTE — Progress Notes (Signed)
As discussed with patient and her daughter at last visit, will start gabapentin. Start with 1 capsule at night to start. If tolerated well and without negative side effects, may add a second dose in the afternoon. Prescription given for twice daily, but want to start slow. Prescription sent to her pharmacy.

## 2018-07-20 NOTE — Telephone Encounter (Signed)
Patient aware of medication and how to take it

## 2018-07-20 NOTE — Telephone Encounter (Signed)
Stacy [name] called regarding Stacy [name] states that you talked about sending a rx for gabapentin, he states that she is in a lot of pain, please send to [facility] to see if she can tolerate the meds

## 2018-08-20 DIAGNOSIS — M1990 Unspecified osteoarthritis, unspecified site: Secondary | ICD-10-CM | POA: Diagnosis not present

## 2018-08-20 DIAGNOSIS — M150 Primary generalized (osteo)arthritis: Secondary | ICD-10-CM | POA: Diagnosis not present

## 2018-08-20 DIAGNOSIS — G8929 Other chronic pain: Secondary | ICD-10-CM | POA: Diagnosis not present

## 2018-08-20 DIAGNOSIS — M25511 Pain in right shoulder: Secondary | ICD-10-CM | POA: Diagnosis not present

## 2018-08-24 ENCOUNTER — Encounter: Payer: Self-pay | Admitting: Nurse Practitioner

## 2018-08-25 ENCOUNTER — Other Ambulatory Visit: Payer: Self-pay | Admitting: Nurse Practitioner

## 2018-08-25 DIAGNOSIS — M353 Polymyalgia rheumatica: Secondary | ICD-10-CM

## 2018-08-25 MED ORDER — GABAPENTIN 100 MG PO CAPS: 100.0000 mg | ORAL_CAPSULE | Freq: Two times a day (BID) | ORAL | 3 refills | Status: DC

## 2018-08-25 NOTE — Progress Notes (Signed)
Sent 90 day prescription for gabapentin 100mg  twice daily as needed to [facility].

## 2018-09-04 ENCOUNTER — Ambulatory Visit: Payer: Self-pay | Admitting: Nurse Practitioner

## 2018-09-06 ENCOUNTER — Ambulatory Visit: Payer: Medicare Other | Admitting: General Surgery

## 2018-10-05 ENCOUNTER — Ambulatory Visit (INDEPENDENT_AMBULATORY_CARE_PROVIDER_SITE_OTHER): Payer: Medicare Other | Admitting: Nurse Practitioner

## 2018-10-05 ENCOUNTER — Encounter: Payer: Self-pay | Admitting: Nurse Practitioner

## 2018-10-05 VITALS — BP 114/66 | HR 96 | Resp 16 | Ht 64.5 in | Wt 186.0 lb

## 2018-10-05 DIAGNOSIS — I10 Essential (primary) hypertension: Secondary | ICD-10-CM

## 2018-10-05 DIAGNOSIS — M150 Primary generalized (osteo)arthritis: Secondary | ICD-10-CM | POA: Diagnosis not present

## 2018-10-05 DIAGNOSIS — E1165 Type 2 diabetes mellitus with hyperglycemia: Secondary | ICD-10-CM

## 2018-10-05 DIAGNOSIS — M353 Polymyalgia rheumatica: Secondary | ICD-10-CM | POA: Diagnosis not present

## 2018-10-05 DIAGNOSIS — J309 Allergic rhinitis, unspecified: Secondary | ICD-10-CM | POA: Diagnosis not present

## 2018-10-05 DIAGNOSIS — E119 Type 2 diabetes mellitus without complications: Secondary | ICD-10-CM | POA: Diagnosis not present

## 2018-10-05 LAB — POCT GLYCOSYLATED HEMOGLOBIN (HGB A1C): Hemoglobin A1C: 5.7 % — AB (ref 4.0–5.6)

## 2018-10-05 MED ORDER — LISINOPRIL 5 MG PO TABS: 5.0000 mg | ORAL_TABLET | Freq: Every day | ORAL | 3 refills | Status: DC

## 2018-10-05 MED ORDER — GABAPENTIN 100 MG PO CAPS: 100.0000 mg | ORAL_CAPSULE | Freq: Two times a day (BID) | ORAL | 3 refills | Status: DC

## 2018-10-05 MED ORDER — TRAMADOL HCL 50 MG PO TABS: ORAL_TABLET | ORAL | 2 refills | Status: DC

## 2018-10-05 MED ORDER — MONTELUKAST SODIUM 10 MG PO TABS: 10.0000 mg | ORAL_TABLET | ORAL | 3 refills | Status: DC | PRN

## 2018-10-05 NOTE — Progress Notes (Signed)
[facility] [place], [place] [address]  Internal MEDICINE  Office Visit Note  Patient Name: Stacy [name]  [id number]  [id number]  Date of Service: 10/10/2018  Chief Complaint  Patient presents with  . Medical Management of Chronic Issues    3 month follow up  . Diabetes  . Nausea    pt felt nauseous and shaky    Still having generalized arthritic pain, especially in the both shoulders an both knees. . Does see rheumatology.   Currently taking tramadol twice daily, will sometimes use the third prescribed dose. She is also on increased dose of gabapentin. . Unable to tell if this is making a difference any more. Joint of fingers. Does take tylenol in between tramadol doses to keep pain under control a little. Has had cortisone injections into her shoulder and her hips which will help some, but does not last for long. She has bursitis in the shoulders and spurring in the hips and knees. Has tried multiple NSAIDs, but all have caused severe GERD or GI bleeding. Unable to tolerate these medications at all.    Diabetes  She has type 2 diabetes mellitus. Her disease course has been stable. There are no hypoglycemic associated symptoms. Pertinent negatives for hypoglycemia include no headaches or nervousness/anxiousness. There are no diabetic associated symptoms. Pertinent negatives for diabetes include no chest pain, no fatigue, no polydipsia and no polyuria. There are no hypoglycemic complications. Symptoms are stable. Diabetic complications include peripheral neuropathy. Risk factors for coronary artery disease include post-menopausal. Current diabetic treatment includes diet. She is compliant with treatment all of the time. She is following a generally healthy diet. Meal planning includes avoidance of concentrated sweets. She has not had a previous visit with a dietitian. She rarely participates in exercise. An ACE inhibitor/angiotensin II receptor blocker is being taken.  She sees a podiatrist.Eye exam is current.       Current Medication: Outpatient Encounter Medications as of 10/05/2018  Medication Sig Note  . acetaminophen (TYLENOL) 325 MG tablet Take 650 mg by mouth every 6 (six) hours as needed for pain.   . Flaxseed, Linseed, (FLAX SEED OIL PO) Take by mouth.   . gabapentin (NEURONTIN) 100 MG capsule Take 1 capsule (100 mg total) by mouth 2 (two) times daily.   Marland Kitchen glucose blood (ACCU-CHEK AVIVA PLUS) test strip USE TO CHECK GLUCOSE ONCE DAILY dx e11.65   . hydroxychloroquine (PLAQUENIL) 200 MG tablet Take 1 tablet by mouth daily.   Marland Kitchen lidocaine (LIDODERM) 5 % Place 1 patch onto the skin.   Marland Kitchen lisinopril (PRINIVIL,ZESTRIL) 5 MG tablet Take 1 tablet (5 mg total) by mouth daily.   . OXYGEN Inhale into the lungs as needed.   . predniSONE (DELTASONE) 5 MG tablet Take 5 mg by mouth daily.    . traMADol (ULTRAM) 50 MG tablet Take 1 tablet po TID prn pain.   . [DISCONTINUED] gabapentin (NEURONTIN) 100 MG capsule Take 1 capsule (100 mg total) by mouth 2 (two) times daily.   . [DISCONTINUED] lisinopril (PRINIVIL,ZESTRIL) 5 MG tablet Take 1 tablet (5 mg total) by mouth daily.   . [DISCONTINUED] traMADol (ULTRAM) 50 MG tablet Take 1 tablet po TID prn pain.   . benzonatate (TESSALON) 200 MG capsule Take 200 mg by mouth 2 (two) times daily as needed for cough.   . fluticasone (FLONASE) 50 MCG/ACT nasal spray as needed.  12/25/2014: Received from: External Pharmacy  . hydroxychloroquine (PLAQUENIL) 200 MG tablet Take by mouth  daily.   . tiZANidine (ZANAFLEX) 4 MG tablet Take 1 tablet by mouth 3 (three) times daily as needed for muscle spasms.   . [DISCONTINUED] montelukast (SINGULAIR) 10 MG tablet Take 10 mg by mouth as needed.   . [DISCONTINUED] montelukast (SINGULAIR) 10 MG tablet Take 1 tablet (10 mg total) by mouth as needed.   . [DISCONTINUED] traMADol (ULTRAM) 50 MG tablet Take 1 tablet by mouth every 6 (six) hours as needed for pain.    No facility-administered  encounter medications on file as of 10/05/2018.     Surgical History: Past Surgical History:  Procedure Laterality Date  . BREAST BIOPSY Left 2006   +  . BREAST SURGERY Left 2006   lumpectomy  . CHOLECYSTECTOMY  1969  . COLON SURGERY  2004  . COLONOSCOPY  2014   Dr. Candace Cruise  . EYE SURGERY  2002  . FOOT SURGERY  1996  . HERNIA REPAIR  2013  . left breast biopsy   2011    Medical History: Past Medical History:  Diagnosis Date  . Arthritis 1940  . Asthma   . Bowel trouble   . Cancer [facility]) 2006   DCIS left breast  . Hypertension 1940  . Incisional hernia 2014  . Personal history of radiation therapy   . Polymyalgia ([place])   . Thyroid disorder   . Vitiligo 2012    Family History: Family History  Problem Relation Age of Onset  . Heart disease Mother   . Emphysema Mother   . Arthritis Father   . Cancer Father   . Breast cancer Neg Hx     Social History   Socioeconomic History  . Marital status: Single    Spouse name: Not on file  . Number of children: Not on file  . Years of education: Not on file  . Highest education level: Not on file  Occupational History  . Not on file  Social Needs  . Financial resource strain: Not on file  . Food insecurity:    Worry: Not on file    Inability: Not on file  . Transportation needs:    Medical: Not on file    Non-medical: Not on file  Tobacco Use  . Smoking status: Never Smoker  . Smokeless tobacco: Never Used  Substance and Sexual Activity  . Alcohol use: No  . Drug use: No  . Sexual activity: Not on file  Lifestyle  . Physical activity:    Days per week: Not on file    Minutes per session: Not on file  . Stress: Not on file  Relationships  . Social connections:    Talks on phone: Not on file    Gets together: Not on file    Attends religious service: Not on file    Active member of club or organization: Not on file    Attends meetings of clubs or organizations: Not on file    Relationship status: Not on file   . Intimate partner violence:    Fear of current or ex partner: Not on file    Emotionally abused: Not on file    Physically abused: Not on file    Forced sexual activity: Not on file  Other Topics Concern  . Not on file  Social History Narrative  . Not on file      Review of Systems  Constitutional: Negative for activity change, fatigue and unexpected weight change.  HENT: Negative for congestion, rhinorrhea and sore throat.   Respiratory:  Negative for chest tightness, shortness of breath and wheezing.   Cardiovascular: Negative for chest pain and palpitations.  Gastrointestinal: Negative for nausea and vomiting.  Endocrine: Negative for cold intolerance, heat intolerance, polydipsia and polyuria.       Well controlled blood sugars.   Musculoskeletal: Positive for arthralgias, gait problem, joint swelling and myalgias.       Patient given rx for tramadol at her last visit. Did very wel lwith it without negative side effects. Rheumatologist suggested she take this more often due to benefits she got.   Skin: Negative for rash.       Two spots on the left hand of concern. Dark in color, raised, and irregular in shape.   Allergic/Immunologic: Negative for environmental allergies.  Neurological: Negative for headaches.  Hematological: Negative for adenopathy.  Psychiatric/Behavioral: Negative for dysphoric mood. The patient is not nervous/anxious.     Today's Vitals   10/05/18 1524  BP: 114/66  Pulse: 96  Resp: 16  SpO2: 99%  Weight: 186 lb (84.4 kg)  Height: 5' 4.5" (1.638 m)    Physical Exam Vitals signs and nursing note reviewed.  Constitutional:      General: She is not in acute distress.    Appearance: Normal appearance. She is well-developed. She is not diaphoretic.  HENT:     Head: Normocephalic and atraumatic.     Nose: Nose normal.     Mouth/Throat:     Pharynx: No oropharyngeal exudate.  Eyes:     Conjunctiva/sclera: Conjunctivae normal.     Pupils: Pupils  are equal, round, and reactive to light.  Neck:     Musculoskeletal: Normal range of motion and neck supple.     Thyroid: No thyromegaly.     Vascular: No JVD.     Trachea: No tracheal deviation.  Cardiovascular:     Rate and Rhythm: Normal rate and regular rhythm.     Heart sounds: Normal heart sounds. No murmur. No friction rub. No gallop.   Pulmonary:     Effort: Pulmonary effort is normal. No respiratory distress.     Breath sounds: Normal breath sounds. No wheezing or rales.  Chest:     Chest wall: No tenderness.  Abdominal:     General: Bowel sounds are normal.     Palpations: Abdomen is soft.     Tenderness: There is no abdominal tenderness.  Musculoskeletal:     Comments: There is generalized joint pain with point tenderness present at many joints. She does have moderate arthritic changes present in multiple joints.   Lymphadenopathy:     Cervical: No cervical adenopathy.  Skin:    General: Skin is warm and dry.     Capillary Refill: Capillary refill takes less than 2 seconds.  Neurological:     Mental Status: She is alert and oriented to person, place, and time. Mental status is at baseline.     Cranial Nerves: No cranial nerve deficit.  Psychiatric:        Behavior: Behavior normal.        Thought Content: Thought content normal.        Judgment: Judgment normal.   Assessment/Plan: 1. Type 2 diabetes mellitus without complication, without long-term current use of insulin (HCC) - POCT HgB A1C 5.2 today. Continue to control through diet.   2. Essential hypertension Table.  - lisinopril (PRINIVIL,ZESTRIL) 5 MG tablet; Take 1 tablet (5 mg total) by mouth daily.  Dispense: 90 tablet; Refill: 3  3. Polymyalgia rheumatica ([place])  Gabapentin increased to 200mg  twice daily. Reassess at next visit.  - gabapentin (NEURONTIN) 100 MG capsule; Take 1 capsule (100 mg total) by mouth 2 (two) times daily.  Dispense: 180 capsule; Refill: 3  4. Primary generalized  (osteo)arthritis May continue to take tramadol 50mg  three times daily as needed for pain. Refill provided today.  - traMADol (ULTRAM) 50 MG tablet; Take 1 tablet po TID prn pain.  Dispense: 90 tablet; Refill: 2  5. Allergic rhinitis, unspecified seasonality, unspecified trigger Recommend OTC allergy medication daily.  General Counseling: Stacy [name] understanding of the findings of todays visit and agrees with plan of treatment. I have discussed any further diagnostic evaluation that may be needed or ordered today. We also reviewed her medications today. she has been encouraged to call the office with any questions or concerns that should arise related to todays visit.  Diabetes Counseling:  1. Addition of ACE inh/ ARB'S for nephroprotection. Microalbumin is updated  2. Diabetic foot care, prevention of complications. Podiatry consult 3. Exercise and lose weight.  4. Diabetic eye examination, Diabetic eye exam is updated  5. Monitor blood sugar closlely. nutrition counseling.  6. Sign and symptoms of hypoglycemia including shaking sweating,confusion and headaches.   This patient was seen by Leretha Pol FNP Collaboration with Dr [name] as a part of collaborative care agreement  Orders Placed This Encounter  Procedures  . POCT HgB A1C    Meds ordered this encounter  Medications  . gabapentin (NEURONTIN) 100 MG capsule    Sig: Take 1 capsule (100 mg total) by mouth 2 (two) times daily.    Dispense:  180 capsule    Refill:  3    Order Specific Question:   Supervising Provider    Answer:   [name] [[id number]]  . lisinopril (PRINIVIL,ZESTRIL) 5 MG tablet    Sig: Take 1 tablet (5 mg total) by mouth daily.    Dispense:  90 tablet    Refill:  3    Order Specific Question:   Supervising Provider    Answer:   [name] [[id number]]  . DISCONTD: montelukast (SINGULAIR) 10 MG tablet    Sig: Take 1 tablet (10 mg total) by mouth as needed.    Dispense:  90 tablet    Refill:  3     Order Specific Question:   Supervising Provider    Answer:   [name] [[id number]]  . traMADol (ULTRAM) 50 MG tablet    Sig: Take 1 tablet po TID prn pain.    Dispense:  90 tablet    Refill:  2    This is not new prescription. Patient has chronic and severe osteo and rheumatoid arthritis.    Order Specific Question:   Supervising Provider    Answer:   [name] [[id number]]    Time spent: 15 Minutes      Dr [name] Internal medicine

## 2018-10-08 ENCOUNTER — Other Ambulatory Visit: Payer: Self-pay | Admitting: Nurse Practitioner

## 2018-10-08 ENCOUNTER — Ambulatory Visit: Payer: Medicare Other | Admitting: Podiatry

## 2018-10-08 ENCOUNTER — Telehealth: Payer: Self-pay

## 2018-10-08 DIAGNOSIS — J309 Allergic rhinitis, unspecified: Secondary | ICD-10-CM

## 2018-10-08 MED ORDER — MONTELUKAST SODIUM 10 MG PO TABS: 10.0000 mg | ORAL_TABLET | Freq: Every day | ORAL | 3 refills | Status: DC

## 2018-10-08 NOTE — Telephone Encounter (Signed)
Tramadol is generic for ultram. I don't understand what they are asking and what the override is for.

## 2018-10-08 NOTE — Telephone Encounter (Signed)
I TALKED TO JODY ABOUT THIS. PT NEEDS CHRONIC PAIN PA PER PHARMACY TO GET MORE THAN A 5-7 DAY (21 TAB) FILL.

## 2018-10-16 ENCOUNTER — Other Ambulatory Visit: Payer: Self-pay | Admitting: Nurse Practitioner

## 2018-10-16 ENCOUNTER — Encounter: Payer: Self-pay | Admitting: Internal Medicine

## 2018-10-16 ENCOUNTER — Telehealth: Payer: Self-pay

## 2018-10-16 ENCOUNTER — Ambulatory Visit (INDEPENDENT_AMBULATORY_CARE_PROVIDER_SITE_OTHER): Payer: Medicare Other | Admitting: Internal Medicine

## 2018-10-16 ENCOUNTER — Other Ambulatory Visit: Payer: Self-pay

## 2018-10-16 VITALS — BP 130/80 | HR 74 | Resp 16 | Ht 64.5 in | Wt 175.0 lb

## 2018-10-16 DIAGNOSIS — J309 Allergic rhinitis, unspecified: Secondary | ICD-10-CM

## 2018-10-16 DIAGNOSIS — R0902 Hypoxemia: Secondary | ICD-10-CM

## 2018-10-16 DIAGNOSIS — J449 Chronic obstructive pulmonary disease, unspecified: Secondary | ICD-10-CM | POA: Diagnosis not present

## 2018-10-16 DIAGNOSIS — M150 Primary generalized (osteo)arthritis: Secondary | ICD-10-CM

## 2018-10-16 DIAGNOSIS — N189 Chronic kidney disease, unspecified: Secondary | ICD-10-CM | POA: Diagnosis not present

## 2018-10-16 MED ORDER — TRAMADOL HCL 50 MG PO TABS: ORAL_TABLET | ORAL | 1 refills | Status: DC

## 2018-10-16 NOTE — Telephone Encounter (Signed)
Pt son advised we send med to [facility]

## 2018-10-16 NOTE — Progress Notes (Signed)
[facility] [place], [place] [address]  Pulmonary Sleep Medicine   Office Visit Note  Patient Name: Stacy [name] DOB: November 21, 1926 MRN [id number]  Date of Service: 10/16/2018  Complaints/HPI: SOB patient is having increasing shortness of breath no cough and congestion noted.  Has not had no fevers noted.  There is some sputum production.  Denies having any chest pain at this time.  ROS  General: (-) fever, (-) chills, (-) night sweats, (-) weakness Skin: (-) rashes, (-) itching,. Eyes: (-) visual changes, (-) redness, (-) itching. Nose and Sinuses: (-) nasal stuffiness or itchiness, (-) postnasal drip, (-) nosebleeds, (-) sinus trouble. Mouth and Throat: (-) sore throat, (-) hoarseness. Neck: (-) swollen glands, (-) enlarged thyroid, (-) neck pain. Respiratory: - cough, (-) bloody sputum, + shortness of breath, - wheezing. Cardiovascular: - ankle swelling, (-) chest pain. Lymphatic: (-) lymph node enlargement. Neurologic: (-) numbness, (-) tingling. Psychiatric: (-) anxiety, (-) depression   Current Medication: Outpatient Encounter Medications as of 10/16/2018  Medication Sig Note  . acetaminophen (TYLENOL) 325 MG tablet Take 650 mg by mouth every 6 (six) hours as needed for pain.   . benzonatate (TESSALON) 200 MG capsule Take 200 mg by mouth 2 (two) times daily as needed for cough.   . Flaxseed, Linseed, (FLAX SEED OIL PO) Take by mouth.   . fluticasone (FLONASE) 50 MCG/ACT nasal spray as needed.  12/25/2014: Received from: External Pharmacy  . gabapentin (NEURONTIN) 100 MG capsule Take 1 capsule (100 mg total) by mouth 2 (two) times daily.   Marland Kitchen glucose blood (ACCU-CHEK AVIVA PLUS) test strip USE TO CHECK GLUCOSE ONCE DAILY dx e11.65   . hydroxychloroquine (PLAQUENIL) 200 MG tablet Take by mouth daily.   . hydroxychloroquine (PLAQUENIL) 200 MG tablet Take 1 tablet by mouth daily.   Marland Kitchen lidocaine (LIDODERM) 5 % Place 1 patch onto the skin.   Marland Kitchen lisinopril  (PRINIVIL,ZESTRIL) 5 MG tablet Take 1 tablet (5 mg total) by mouth daily.   . montelukast (SINGULAIR) 10 MG tablet Take 1 tablet (10 mg total) by mouth at bedtime.   . OXYGEN Inhale into the lungs as needed.   . predniSONE (DELTASONE) 5 MG tablet Take 5 mg by mouth daily.    Marland Kitchen tiZANidine (ZANAFLEX) 4 MG tablet Take 1 tablet by mouth 3 (three) times daily as needed for muscle spasms.   . traMADol (ULTRAM) 50 MG tablet Take 1 tablet po TID prn pain.   . [DISCONTINUED] traMADol (ULTRAM) 50 MG tablet Take 1 tablet po TID prn pain.    No facility-administered encounter medications on file as of 10/16/2018.     Surgical History: Past Surgical History:  Procedure Laterality Date  . BREAST BIOPSY Left 2006   +  . BREAST SURGERY Left 2006   lumpectomy  . CHOLECYSTECTOMY  1969  . COLON SURGERY  2004  . COLONOSCOPY  2014   Dr. Candace Cruise  . EYE SURGERY  2002  . FOOT SURGERY  1996  . HERNIA REPAIR  2013  . left breast biopsy   2011    Medical History: Past Medical History:  Diagnosis Date  . Arthritis 1940  . Asthma   . Bowel trouble   . Cancer [facility]) 2006   DCIS left breast  . Hypertension 1940  . Incisional hernia 2014  . Personal history of radiation therapy   . Polymyalgia ([place])   . Thyroid disorder   . Vitiligo 2012    Family History: Family History  Problem Relation Age of Onset  . Heart disease Mother   . Emphysema Mother   . Arthritis Father   . Cancer Father   . Breast cancer Neg Hx     Social History: Social History   Socioeconomic History  . Marital status: Single    Spouse name: Not on file  . Number of children: Not on file  . Years of education: Not on file  . Highest education level: Not on file  Occupational History  . Not on file  Social Needs  . Financial resource strain: Not on file  . Food insecurity:    Worry: Not on file    Inability: Not on file  . Transportation needs:    Medical: Not on file    Non-medical: Not on file  Tobacco Use  .  Smoking status: Never Smoker  . Smokeless tobacco: Never Used  Substance and Sexual Activity  . Alcohol use: No  . Drug use: No  . Sexual activity: Not on file  Lifestyle  . Physical activity:    Days per week: Not on file    Minutes per session: Not on file  . Stress: Not on file  Relationships  . Social connections:    Talks on phone: Not on file    Gets together: Not on file    Attends religious service: Not on file    Active member of club or organization: Not on file    Attends meetings of clubs or organizations: Not on file    Relationship status: Not on file  . Intimate partner violence:    Fear of current or ex partner: Not on file    Emotionally abused: Not on file    Physically abused: Not on file    Forced sexual activity: Not on file  Other Topics Concern  . Not on file  Social History Narrative  . Not on file    Vital Signs: Blood pressure 130/80, pulse 74, resp. rate 16, height 5' 4.5" (1.638 m), weight 175 lb (79.4 kg), SpO2 96 %.  Examination: General Appearance: The patient is well-developed, well-nourished, and in no distress. Skin: Gross inspection of skin unremarkable. Head: normocephalic, no gross deformities. Eyes: no gross deformities noted. ENT: ears appear grossly normal no exudates. Neck: Supple. No thyromegaly. No LAD. Respiratory: no rhonchi noted at this time. Cardiovascular: Normal S1 and S2 without murmur or rub. Extremities: No cyanosis. pulses are equal. Neurologic: Alert and oriented. No involuntary movements.  LABS: Recent Results (from the past 2160 hour(s))  POCT HgB A1C     Status: Abnormal   Collection Time: 10/05/18  3:55 PM  Result Value Ref Range   Hemoglobin A1C 5.7 (A) 4.0 - 5.6 %   HbA1c POC (<> result, manual entry)     HbA1c, POC (prediabetic range)     HbA1c, POC (controlled diabetic range)      Radiology: Mm Screening Breast Tomo Bilateral  Result Date: 12/15/2016 CLINICAL DATA:  Screening. EXAM: 2D DIGITAL  SCREENING BILATERAL MAMMOGRAM WITH CAD AND ADJUNCT TOMO COMPARISON:  Previous exam(s). ACR Breast Density Category b: There are scattered areas of fibroglandular density. FINDINGS: There are no findings suspicious for malignancy. Images were processed with CAD. IMPRESSION: No mammographic evidence of malignancy. A result letter of this screening mammogram will be mailed directly to the patient. RECOMMENDATION: Screening mammogram in one year. (Code:SM-B-01Y) BI-RADS CATEGORY  1: Negative. Electronically Signed   By: Pamelia Hoit M.D.   On: 12/15/2016 18:02  No results found.  No results found.    Assessment and Plan: Patient Active Problem List   Diagnosis Date Noted  . Encounter for general adult medical examination with abnormal findings 07/05/2018  . Uncontrolled type 2 diabetes mellitus with hyperglycemia ([place]) 07/05/2018  . Neoplasm of uncertain behavior of skin of hand 07/05/2018  . Simple chronic bronchitis ([place]) 12/31/2017  . Type 2 diabetes mellitus with hyperglycemia ([place]) 12/31/2017  . Congestive heart failure ([place]) 10/20/2017  . Primary generalized (osteo)arthritis 09/21/2017  . Vitamin B12 deficiency anemia, unspecified 09/21/2017  . Chronic kidney disease, unspecified 09/21/2017  . Vitamin D deficiency, unspecified 09/21/2017  . Chronic obstructive pulmonary disease with acute lower respiratory infection ([place]) 09/21/2017  . Unspecified hearing loss, bilateral 09/21/2017  . Age-related osteoporosis without current pathological fracture 09/21/2017  . Other amnesia 09/21/2017  . Cough 09/21/2017  . Allergic rhinitis, unspecified 09/21/2017  . Type 2 diabetes mellitus without complications ([place]) 09/47/0962  . Other seborrheic dermatitis 09/21/2017  . Unspecified cervical disc disorder, mid-cervical region, unspecified level 09/21/2017  . Neuromuscular dysfunction of bladder, unspecified 09/21/2017  . Allergic rhinitis due to pollen 09/21/2017  . Other primary ovarian  failure 09/21/2017  . Shortness of breath 09/21/2017  . Acute bronchitis, unspecified 09/21/2017  . Benign and innocent cardiac murmurs 09/21/2017  . Dysuria 09/21/2017  . Retention of urine, unspecified 09/21/2017  . Unspecified urinary incontinence 09/21/2017  . Mixed incontinence 09/21/2017  . Insomnia, unspecified 09/21/2017  . Dizziness and giddiness 09/21/2017  . Polymyalgia rheumatica ([place]) 09/21/2017  . Low back pain 09/21/2017  . Hypoxemia 09/21/2017  . Spondylosis without myelopathy or radiculopathy, lumbosacral region 09/21/2017  . Pain in unspecified knee 09/21/2017  . Mixed hyperlipidemia 09/21/2017  . Cardiomegaly 09/21/2017  . Abnormality of gait and mobility 09/21/2017  . Personal history of malignant neoplasm of breast 04/15/2013  . History of breast cancer 12/24/2012  . Incisional hernia, without obstruction or gangrene 12/24/2012  . Essential hypertension   . Vitiligo   . Arthritis   . Thyroid disorder   . Bowel trouble     1. COPD advanced disease we will continue with supportive care right now she is back at baseline.  Pulmonary functions were done back in June 06, 2018 which showed FEV1 to be 1.93 L which was 105% of predicted. 2. Hypoxia currently saturations are excellent we will continue with supportive care 3. Polymyalgia rheumatica at baseline 4. Allergic rhinitis at baseline continue to monitor 5. Chronic kidney disease we will continue with supportive care  General Counseling: I have discussed the findings of the evaluation and examination with [facility].  I have also discussed any further diagnostic evaluation thatmay be needed or ordered today. Shadie verbalizes understanding of the findings of todays visit. We also reviewed her medications today and discussed drug interactions and side effects including but not limited excessive drowsiness and altered mental states. We also discussed that there is always a risk not just to her but also people around  her. she has been encouraged to call the office with any questions or concerns that should arise related to todays visit.    Time spent: 61min  I have personally obtained a history, examined the patient, evaluated laboratory and imaging results, formulated the assessment and plan and placed orders.    Allyne Gee, MD [facility] Pulmonary and Critical Care Sleep medicine

## 2018-10-16 NOTE — Telephone Encounter (Signed)
Changed tramadol to 90 day prescription and sent new rx to [facility] in [place].

## 2018-10-16 NOTE — Progress Notes (Signed)
Changed tramadol to 90 day prescription and sent new rx to [facility] in [place].

## 2018-10-16 NOTE — Patient Instructions (Signed)
Preventing Influenza, Adult  Influenza, more commonly known as “the flu,” is a viral infection that mainly affects the respiratory tract. The respiratory tract includes structures that help you breathe, such as the lungs, nose, and throat. The flu causes many common cold symptoms, as well as a high fever and body aches.  The flu spreads easily from person to person (is contagious). The flu is most common from December through March. This is called flu season. You can catch the flu virus by:  · Breathing in droplets from an infected person's cough or sneeze.  · Touching something that was recently contaminated with the virus and then touching your mouth, nose, or eyes.  What can I do to lower my risk?              You can decrease your risk of getting the flu by:  · Getting a flu shot (influenza vaccination) every year. This is the best way to prevent the flu. A flu shot is recommended for everyone age 6 months and older.  ? It is best to get a flu shot in the fall, as soon as it is available. Getting a flu shot during winter or spring instead is still a good idea. Flu season can last into early spring.  ? Preventing the flu through vaccination requires getting a new flu shot every year. This is because the flu virus changes slightly (mutates) from one year to the next. Even if a flu shot does not completely protect you from all flu virus mutations, it can reduce the severity of your illness and prevent dangerous complications of the flu.  ? If you are pregnant, you can and should get a flu shot.  ? If you have had a reaction to the shot in the past or if you are allergic to eggs, check with your health care provider before getting a flu shot.  ? Sometimes the vaccine is available as a nasal spray. In some years, the nasal spray has not been as effective against the flu virus. Check with your health care provider if you have questions about this.  · Practicing good health habits. This is especially important during  flu season.  ? Avoid contact with people who are sick with flu or cold symptoms.  ? Wash your hands with soap and water often. If soap and water are not available, use alcohol-based hand sanitizer.  ? Avoid touching your hands to your face, especially when you have not washed your hands recently.  ? Use a disinfectant to clean surfaces at home and at work that may be contaminated with the flu virus.  ? Keep your body’s disease-fighting system (immune system) in good shape by eating a healthy diet, drinking plenty of fluids, getting enough sleep, and exercising regularly.  If you do get the flu, avoid spreading it to others by:  · Staying home until your symptoms have been gone for at least one day.  · Covering your mouth and nose when you cough or sneeze.  · Avoiding close contact with others, especially babies and elderly people.  Why are these changes important?  Getting a flu shot and practicing good health habits protects you as well as other people. If you get the flu, your friends, family, and co-workers are also at risk of getting it, because it spreads so easily to others. Each year, about 2 out of every 10 people get the flu.  Having the flu can lead to complications, such as pneumonia, ear   infection, and sinus infection. The flu also can be deadly, especially for babies, people older than age 65, and people who have serious long-term diseases.  How is this treated?  Most people recover from the flu by resting at home and drinking plenty of fluids. However, a prescription antiviral medicine may reduce your flu symptoms and may make your flu go away sooner. This medicine must be started within a few days of getting flu symptoms. You can talk with your health care provider about whether you need an antiviral medicine.  Antiviral medicine may be prescribed for people who are at risk for more serious flu symptoms. This includes people who:  · Are older than age 65.  · Are pregnant.  · Have a condition that  makes the flu worse or more dangerous.  Where to find more information  · Centers for Disease Control and Prevention: www.cdc.gov/flu/index.htm  · Flu.gov: www.flu.gov/prevention-vaccination  · American Academy of Family [employer]icians: familydoctor.org/familydoctor/en/kids/vaccines/preventing-the-flu.html  Contact a health care provider if:  · You have influenza and you develop new symptoms.  · You have:  ? Chest pain.  ? Diarrhea.  ? A fever.  · Your cough gets worse, or you produce more mucus.  Summary  · The best way to prevent the flu is to get a flu shot every year in the fall.  · Even if you get the flu after you have received the yearly vaccine, your flu may be milder and go away sooner because of your flu shot.  · If you get the flu, antiviral medicines that are started with a few days of symptoms may reduce your flu symptoms and may make your flu go away sooner.  · You can also help prevent the flu by practicing good health habits.  This information is not intended to replace advice given to you by your health care provider. Make sure you discuss any questions you have with your health care provider.  Document Released: 09/27/2015 Document Revised: 08/11/2017 Document Reviewed: 05/21/2016  Elsevier Interactive Patient Education © 2019 Elsevier Inc.

## 2018-10-23 ENCOUNTER — Ambulatory Visit: Payer: Self-pay | Admitting: Internal Medicine

## 2018-12-05 ENCOUNTER — Encounter: Payer: Self-pay | Admitting: Emergency Medicine

## 2018-12-05 ENCOUNTER — Other Ambulatory Visit: Payer: Self-pay

## 2018-12-05 ENCOUNTER — Emergency Department: Payer: Medicare Other

## 2018-12-05 ENCOUNTER — Emergency Department
Admission: EM | Admit: 2018-12-05 | Discharge: 2018-12-05 | Disposition: A | Discharge status: Home / Self Care | Payer: Medicare Other | Attending: Emergency Medicine | Admitting: Emergency Medicine

## 2018-12-05 DIAGNOSIS — I130 Hypertensive heart and chronic kidney disease with heart failure and stage 1 through stage 4 chronic kidney disease, or unspecified chronic kidney disease: Secondary | ICD-10-CM | POA: Diagnosis not present

## 2018-12-05 DIAGNOSIS — E1122 Type 2 diabetes mellitus with diabetic chronic kidney disease: Secondary | ICD-10-CM | POA: Insufficient documentation

## 2018-12-05 DIAGNOSIS — N939 Abnormal uterine and vaginal bleeding, unspecified: Secondary | ICD-10-CM

## 2018-12-05 DIAGNOSIS — N189 Chronic kidney disease, unspecified: Secondary | ICD-10-CM | POA: Diagnosis not present

## 2018-12-05 DIAGNOSIS — J45909 Unspecified asthma, uncomplicated: Secondary | ICD-10-CM | POA: Diagnosis not present

## 2018-12-05 DIAGNOSIS — Z79899 Other long term (current) drug therapy: Secondary | ICD-10-CM | POA: Insufficient documentation

## 2018-12-05 DIAGNOSIS — R42 Dizziness and giddiness: Secondary | ICD-10-CM | POA: Diagnosis not present

## 2018-12-05 DIAGNOSIS — Z853 Personal history of malignant neoplasm of breast: Secondary | ICD-10-CM | POA: Insufficient documentation

## 2018-12-05 DIAGNOSIS — I509 Heart failure, unspecified: Secondary | ICD-10-CM | POA: Diagnosis not present

## 2018-12-05 DIAGNOSIS — R110 Nausea: Secondary | ICD-10-CM | POA: Diagnosis not present

## 2018-12-05 DIAGNOSIS — I959 Hypotension, unspecified: Secondary | ICD-10-CM | POA: Diagnosis not present

## 2018-12-05 DIAGNOSIS — R58 Hemorrhage, not elsewhere classified: Secondary | ICD-10-CM | POA: Diagnosis not present

## 2018-12-05 LAB — CBC WITH DIFFERENTIAL/PLATELET
Abs Immature Granulocytes: 0.04 10*3/uL (ref 0.00–0.07)
BASOS ABS: 0.1 10*3/uL (ref 0.0–0.1)
Basophils Relative: 1 %
Eosinophils Absolute: 0.3 10*3/uL (ref 0.0–0.5)
Eosinophils Relative: 4 %
HCT: 32.8 % — ABNORMAL LOW (ref 36.0–46.0)
Hemoglobin: 10.4 g/dL — ABNORMAL LOW (ref 12.0–15.0)
Immature Granulocytes: 1 %
Lymphocytes Relative: 37 %
Lymphs Abs: 3.1 10*3/uL (ref 0.7–4.0)
MCH: 28.8 pg (ref 26.0–34.0)
MCHC: 31.7 g/dL (ref 30.0–36.0)
MCV: 90.9 fL (ref 80.0–100.0)
Monocytes Absolute: 0.8 10*3/uL (ref 0.1–1.0)
Monocytes Relative: 9 %
NRBC: 0 % (ref 0.0–0.2)
Neutro Abs: 4.1 10*3/uL (ref 1.7–7.7)
Neutrophils Relative %: 48 %
PLATELETS: 235 10*3/uL (ref 150–400)
RBC: 3.61 MIL/uL — ABNORMAL LOW (ref 3.87–5.11)
RDW: 14.4 % (ref 11.5–15.5)
WBC: 8.5 10*3/uL (ref 4.0–10.5)

## 2018-12-05 NOTE — ED Triage Notes (Signed)
Patient arrives from home with vaginal bleeding- reports dark red bloody discharge that started this AM. Patient says this is recurrent problem but episodes have never lasted this long.

## 2018-12-05 NOTE — Discharge Instructions (Addendum)
Please seek medical attention for any high fevers, chest pain, shortness of breath, change in behavior, persistent vomiting, bloody stool or any other new or concerning symptoms.  

## 2018-12-05 NOTE — ED Notes (Signed)
Patient transported to Ultrasound 

## 2018-12-05 NOTE — ED Provider Notes (Signed)
[facility] Emergency Department Provider Note  ____________________________________________   I have reviewed the triage vital signs and the nursing notes.   HISTORY  Chief Complaint Vaginal Bleeding   History limited by: Not Limited   HPI Stacy [name] is a 85 y.o. female who presents to the emergency department today because of concerns for vaginal bleeding.  Patient states that she has noticed some bleeding.  It has been fairly infrequent occurring about once a month.  She has not seen anyone for this.  She states however that this morning the bleeding was more pronounced than it normally is.  She denies any pain with this.  She states that she does strain at times she uses the bathroom.  She denies hysterectomy.  Per medical record review patient has a history of HTN  Past Medical History:  Diagnosis Date  . Arthritis 1940  . Asthma   . Bowel trouble   . Cancer [facility]) 2006   DCIS left breast  . Hypertension 1940  . Incisional hernia 2014  . Personal history of radiation therapy   . Polymyalgia ([place])   . Thyroid disorder   . Vitiligo 2012    Patient Active Problem List   Diagnosis Date Noted  . Encounter for general adult medical examination with abnormal findings 07/05/2018  . Uncontrolled type 2 diabetes mellitus with hyperglycemia ([place]) 07/05/2018  . Neoplasm of uncertain behavior of skin of hand 07/05/2018  . Simple chronic bronchitis ([place]) 12/31/2017  . Type 2 diabetes mellitus with hyperglycemia ([place]) 12/31/2017  . Congestive heart failure ([place]) 10/20/2017  . Primary generalized (osteo)arthritis 09/21/2017  . Vitamin B12 deficiency anemia, unspecified 09/21/2017  . Chronic kidney disease, unspecified 09/21/2017  . Vitamin D deficiency, unspecified 09/21/2017  . Chronic obstructive pulmonary disease with acute lower respiratory infection ([place]) 09/21/2017  . Unspecified hearing loss, bilateral 09/21/2017  . Age-related osteoporosis without  current pathological fracture 09/21/2017  . Other amnesia 09/21/2017  . Cough 09/21/2017  . Allergic rhinitis, unspecified 09/21/2017  . Type 2 diabetes mellitus without complications ([place]) 20/25/4270  . Other seborrheic dermatitis 09/21/2017  . Unspecified cervical disc disorder, mid-cervical region, unspecified level 09/21/2017  . Neuromuscular dysfunction of bladder, unspecified 09/21/2017  . Allergic rhinitis due to pollen 09/21/2017  . Other primary ovarian failure 09/21/2017  . Shortness of breath 09/21/2017  . Acute bronchitis, unspecified 09/21/2017  . Benign and innocent cardiac murmurs 09/21/2017  . Dysuria 09/21/2017  . Retention of urine, unspecified 09/21/2017  . Unspecified urinary incontinence 09/21/2017  . Mixed incontinence 09/21/2017  . Insomnia, unspecified 09/21/2017  . Dizziness and giddiness 09/21/2017  . Polymyalgia rheumatica ([place]) 09/21/2017  . Low back pain 09/21/2017  . Hypoxemia 09/21/2017  . Spondylosis without myelopathy or radiculopathy, lumbosacral region 09/21/2017  . Pain in unspecified knee 09/21/2017  . Mixed hyperlipidemia 09/21/2017  . Cardiomegaly 09/21/2017  . Abnormality of gait and mobility 09/21/2017  . Personal history of malignant neoplasm of breast 04/15/2013  . History of breast cancer 12/24/2012  . Incisional hernia, without obstruction or gangrene 12/24/2012  . Essential hypertension   . Vitiligo   . Arthritis   . Thyroid disorder   . Bowel trouble     Past Surgical History:  Procedure Laterality Date  . BREAST BIOPSY Left 2006   +  . BREAST SURGERY Left 2006   lumpectomy  . CHOLECYSTECTOMY  1969  . COLON SURGERY  2004  . COLONOSCOPY  2014   Dr. Candace Cruise  . EYE SURGERY  2002  . [place]  . HERNIA REPAIR  2013  . left breast biopsy   2011    Prior to Admission medications   Medication Sig Start Date End Date Taking? Authorizing Provider  acetaminophen (TYLENOL) 325 MG tablet Take 650 mg by mouth every 6 (six)  hours as needed for pain.    [provider]  benzonatate (TESSALON) 200 MG capsule Take 200 mg by mouth 2 (two) times daily as needed for cough.    [provider]  Flaxseed, Linseed, (FLAX SEED OIL PO) Take by mouth.    [provider]  fluticasone (FLONASE) 50 MCG/ACT nasal spray as needed.  10/09/14   [provider]  gabapentin (NEURONTIN) 100 MG capsule Take 1 capsule (100 mg total) by mouth 2 (two) times daily. 10/05/18   Ronnell Freshwater, NP  glucose blood (ACCU-CHEK AVIVA PLUS) test strip USE TO CHECK GLUCOSE ONCE DAILY dx e11.65 07/18/18   Ronnell Freshwater, NP  hydroxychloroquine (PLAQUENIL) 200 MG tablet Take by mouth daily.    [provider]  hydroxychloroquine (PLAQUENIL) 200 MG tablet Take 1 tablet by mouth daily. 07/26/18   [provider]  lisinopril (PRINIVIL,ZESTRIL) 5 MG tablet Take 1 tablet (5 mg total) by mouth daily. 10/05/18   Ronnell Freshwater, NP  montelukast (SINGULAIR) 10 MG tablet Take 1 tablet (10 mg total) by mouth at bedtime. 10/08/18   Ronnell Freshwater, NP  OXYGEN Inhale into the lungs as needed.    [provider]  predniSONE (DELTASONE) 5 MG tablet Take 5 mg by mouth daily.     [provider]  tiZANidine (ZANAFLEX) 4 MG tablet Take 1 tablet by mouth 3 (three) times daily as needed for muscle spasms. 03/10/16   [provider]  traMADol (ULTRAM) 50 MG tablet Take 1 tablet po TID prn pain. 10/16/18   Ronnell Freshwater, NP    Allergies Aspirin; Augmentin [amoxicillin-pot clavulanate]; Codeine; Doxycycline hyclate; Sulfamethoxazole-trimethoprim; Tape; Tramadol; Vitamin d analogs; and Azithromycin  Family History  Problem Relation Age of Onset  . Heart disease Mother   . Emphysema Mother   . Arthritis Father   . Cancer Father   . Breast cancer Neg Hx     Social History Social History   Tobacco Use  . Smoking status: Never Smoker  . Smokeless tobacco: Never Used  Substance Use  Topics  . Alcohol use: No  . Drug use: No    Review of Systems Constitutional: No fever/chills Eyes: No visual changes. ENT: No sore throat. Cardiovascular: Denies chest pain. Respiratory: Denies shortness of breath. Gastrointestinal: No abdominal pain.  No nausea, no vomiting.  No diarrhea.   Genitourinary: Positive for vaginal bleeding.  Musculoskeletal: Negative for back pain. Skin: Negative for rash. Neurological: Negative for headaches, focal weakness or numbness.  ____________________________________________   PHYSICAL EXAM:  VITAL SIGNS: ED Triage Vitals  Enc Vitals Group     BP 12/05/18 0845 136/76     Pulse Rate 12/05/18 0845 93     Resp 12/05/18 0845 13     Temp 12/05/18 0845 98.2 F (36.8 C)     Temp Source 12/05/18 0845 Oral     SpO2 12/05/18 0843 98 %     Weight 12/05/18 0848 170 lb (77.1 kg)     Height 12/05/18 0848 5\' 4"  (1.626 m)     Head Circumference --      Peak Flow --      Pain Score 12/05/18  0847 0   Constitutional: Alert and oriented.  Eyes: Conjunctivae are normal.  ENT      Head: Normocephalic and atraumatic.      Nose: No congestion/rhinnorhea.      Mouth/Throat: Mucous membranes are moist.      Neck: No stridor. Hematological/Lymphatic/Immunilogical: No cervical lymphadenopathy. Cardiovascular: Normal rate, regular rhythm.  No murmurs, rubs, or gallops.  Respiratory: Normal respiratory effort without tachypnea nor retractions. Breath sounds are clear and equal bilaterally. No wheezes/rales/rhonchi. Gastrointestinal: Soft and non tender. No rebound. No guarding.  Genitourinary: Blood in vaginal vault.  Musculoskeletal: Normal range of motion in all extremities. No lower extremity edema. Neurologic:  Normal speech and language. No gross focal neurologic deficits are appreciated.  Skin:  Skin is warm, dry and intact. No rash noted. Psychiatric: Mood and affect are normal. Speech and behavior are normal. Patient exhibits appropriate insight  and judgment.  ____________________________________________    LABS (pertinent positives/negatives)  CBC wbc 8.5, hgb 10.4, plt 235  ____________________________________________   EKG  None  ____________________________________________    RADIOLOGY  Pelvic US No abnormal findings   ____________________________________________   PROCEDURES  Procedures  ____________________________________________   INITIAL IMPRESSION / ASSESSMENT AND PLAN / ED COURSE  Pertinent labs & imaging results that were available during my care of the patient were reviewed by me and considered in my medical decision making (see chart for details).   Patient presented to the emergency department today because of concerns for abnormal vaginal bleeding.  Patient without hypotension or tachycardia.  Ultrasound was performed which did not show any concerning findings.  Blood work was checked which showed slight anemia however this appears to be more or less patient's baseline compared to previous.  At this point do think is reasonable for patient follow-up with OB/GYN as an outpatient.  Did discuss bleeding return precautions.   ____________________________________________   FINAL CLINICAL IMPRESSION(S) / ED DIAGNOSES  Final diagnoses:  Vagina bleeding     Note: This dictation was prepared with Dragon dictation. Any transcriptional errors that result from this process are unintentional     Nance Pear, MD 12/05/18 1305

## 2019-02-04 ENCOUNTER — Encounter: Payer: Self-pay | Admitting: Nurse Practitioner

## 2019-02-04 ENCOUNTER — Other Ambulatory Visit: Payer: Self-pay

## 2019-02-04 ENCOUNTER — Ambulatory Visit (INDEPENDENT_AMBULATORY_CARE_PROVIDER_SITE_OTHER): Payer: Medicare Other | Admitting: Nurse Practitioner

## 2019-02-04 VITALS — BP 124/73 | HR 85 | Resp 16 | Ht 64.0 in | Wt 164.0 lb

## 2019-02-04 DIAGNOSIS — I10 Essential (primary) hypertension: Secondary | ICD-10-CM | POA: Diagnosis not present

## 2019-02-04 DIAGNOSIS — E119 Type 2 diabetes mellitus without complications: Secondary | ICD-10-CM | POA: Diagnosis not present

## 2019-02-04 DIAGNOSIS — M353 Polymyalgia rheumatica: Secondary | ICD-10-CM

## 2019-02-04 MED ORDER — PREDNISONE 10 MG PO TABS: 5.0000 mg | ORAL_TABLET | Freq: Every day | ORAL | 3 refills | Status: DC

## 2019-02-04 NOTE — Progress Notes (Signed)
[facility] [place], [place] [address]  Internal MEDICINE  Telephone Visit  Patient Name: Stacy [name]  [id number]  [id number]  Date of Service: 02/24/2019  I connected with the patient at 4:38pm by telephone and verified the patients identity using two identifiers.   I discussed the limitations, risks, security and privacy concerns of performing an evaluation and management service by telephone and the availability of in person appointments. I also discussed with the patient that there may be a patient responsible charge related to the service.  The patient expressed understanding and agrees to proceed.    Chief Complaint  Patient presents with  . Telephone Assessment  . Telephone Screen  . Follow-up    The patient has been contacted via telephone for follow up visit due to concerns for spread of novel coronavirus. The patient has increase joint pain. Getting so sever, she is having trouble sleeping at night. She continues to take Tramadol 50mg  three times daily as needed for pain. This is helping some. Gets worse when she has to sit down for long periods of time. She does see rheumatology. Recently decreased her daily prednisone dosing to 5mg  daily. She states that pain got worse shortly after her prednisone dosing was decreased. She continues to check her blood sugars every day. Consistently running between 80 and 90. Her blood pressure continues to be well controlled.       Current Medication: Outpatient Encounter Medications as of 02/04/2019  Medication Sig Note  . acetaminophen (TYLENOL) 325 MG tablet Take 650 mg by mouth every 6 (six) hours as needed for pain.   . Flaxseed, Linseed, (FLAX SEED OIL PO) Take 1 tablet by mouth daily.    Marland Kitchen gabapentin (NEURONTIN) 100 MG capsule Take 1 capsule (100 mg total) by mouth 2 (two) times daily.   Marland Kitchen glucose blood (ACCU-CHEK AVIVA PLUS) test strip USE TO CHECK GLUCOSE ONCE DAILY dx e11.65   . hydroxychloroquine  (PLAQUENIL) 200 MG tablet Take 1 tablet by mouth daily.   Marland Kitchen lisinopril (PRINIVIL,ZESTRIL) 5 MG tablet Take 1 tablet (5 mg total) by mouth daily.   . montelukast (SINGULAIR) 10 MG tablet Take 1 tablet (10 mg total) by mouth at bedtime.   . OXYGEN Inhale into the lungs as needed.   . predniSONE (DELTASONE) 10 MG tablet Take 0.5 tablets (5 mg total) by mouth daily.   . traMADol (ULTRAM) 50 MG tablet Take 1 tablet po TID prn pain.   . [DISCONTINUED] predniSONE (DELTASONE) 5 MG tablet Take 5 mg by mouth daily.    . benzonatate (TESSALON) 200 MG capsule Take 200 mg by mouth 2 (two) times daily as needed for cough.   . fluticasone (FLONASE) 50 MCG/ACT nasal spray Place 2 sprays into both nostrils daily.  12/25/2014: Received from: External Pharmacy  . [DISCONTINUED] tiZANidine (ZANAFLEX) 4 MG tablet Take 1 tablet by mouth 3 (three) times daily as needed for muscle spasms.    No facility-administered encounter medications on file as of 02/04/2019.     Surgical History: Past Surgical History:  Procedure Laterality Date  . BREAST BIOPSY Left 2006   +  . BREAST SURGERY Left 2006   lumpectomy  . CHOLECYSTECTOMY  1969  . COLON SURGERY  2004  . COLONOSCOPY  2014   Dr. Candace Cruise  . EYE SURGERY  2002  . FOOT SURGERY  1996  . HERNIA REPAIR  2013  . left breast biopsy   2011    Medical History: Past  Medical History:  Diagnosis Date  . Arthritis 1940  . Asthma   . Bowel trouble   . Cancer [facility]) 2006   DCIS left breast  . Hypertension 1940  . Incisional hernia 2014  . Personal history of radiation therapy   . Polymyalgia ([place])   . Thyroid disorder   . Vitiligo 2012    Family History: Family History  Problem Relation Age of Onset  . Heart disease Mother   . Emphysema Mother   . Arthritis Father   . Cancer Father   . Breast cancer Neg Hx     Social History   Socioeconomic History  . Marital status: Married    Spouse name: Not on file  . Number of children: Not on file  . Years of  education: Not on file  . Highest education level: Not on file  Occupational History  . Not on file  Social Needs  . Financial resource strain: Not on file  . Food insecurity:    Worry: Not on file    Inability: Not on file  . Transportation needs:    Medical: Not on file    Non-medical: Not on file  Tobacco Use  . Smoking status: Never Smoker  . Smokeless tobacco: Never Used  Substance and Sexual Activity  . Alcohol use: No  . Drug use: No  . Sexual activity: Not on file  Lifestyle  . Physical activity:    Days per week: Not on file    Minutes per session: Not on file  . Stress: Not on file  Relationships  . Social connections:    Talks on phone: Not on file    Gets together: Not on file    Attends religious service: Not on file    Active member of club or organization: Not on file    Attends meetings of clubs or organizations: Not on file    Relationship status: Not on file  . Intimate partner violence:    Fear of current or ex partner: Not on file    Emotionally abused: Not on file    Physically abused: Not on file    Forced sexual activity: Not on file  Other Topics Concern  . Not on file  Social History Narrative  . Not on file      Review of Systems  Constitutional: Positive for activity change and fatigue. Negative for unexpected weight change.  HENT: Negative for congestion, rhinorrhea and sore throat.   Respiratory: Negative for chest tightness, shortness of breath and wheezing.   Cardiovascular: Negative for chest pain and palpitations.  Gastrointestinal: Negative for nausea and vomiting.  Endocrine: Negative for cold intolerance, heat intolerance, polydipsia and polyuria.       Well controlled blood sugars.   Musculoskeletal: Positive for arthralgias, gait problem, joint swelling and myalgias.       Patient given rx for tramadol at her last visit. Did very wel lwith it without negative side effects. Rheumatologist suggested she take this more often due  to benefits she got.   Skin: Negative for rash.  Allergic/Immunologic: Negative for environmental allergies.  Neurological: Positive for weakness. Negative for headaches.  Hematological: Negative for adenopathy.  Psychiatric/Behavioral: Positive for sleep disturbance. Negative for dysphoric mood. The patient is not nervous/anxious.     Today's Vitals   02/04/19 1559  BP: 124/73  Pulse: 85  Resp: 16  Weight: 164 lb (74.4 kg)  Height: 5\' 4"  (1.626 m)   Body mass index is 28.15 kg/m.  Observation/Objective:   The patient is alert and oriented. She is pleasant and answers all questions appropriately. Breathing is non-labored. She is in no acute distress at this time. The patient sounds to be in more pain than usual.    Assessment/Plan: 1. Polymyalgia rheumatica (HCC) Increase prednisone back to 10mg  daily. continue to take tramadol and gabapentin as prescribed. Recommended she call and discuss this with rheumatologist. She and family members have agreed to this plan.  - predniSONE (DELTASONE) 10 MG tablet; Take 0.5 tablets (5 mg total) by mouth daily.  Dispense: 90 tablet; Refill: 3  2. Essential hypertension Stable. Continue bp medication as prescribed   3. Type 2 diabetes mellitus without complication, without long-term current use of insulin ([place]) Continue diabetic medication as prescribed   General Counseling: Sophiya verbalizes understanding of the findings of today's phone visit and agrees with plan of treatment. I have discussed any further diagnostic evaluation that may be needed or ordered today. We also reviewed her medications today. she has been encouraged to call the office with any questions or concerns that should arise related to todays visit.  Diabetes Counseling:  1. Addition of ACE inh/ ARB'S for nephroprotection. Microalbumin is updated  2. Diabetic foot care, prevention of complications. Podiatry consult 3. Exercise and lose weight.  4. Diabetic eye  examination, Diabetic eye exam is updated  5. Monitor blood sugar closlely. nutrition counseling.  6. Sign and symptoms of hypoglycemia including shaking sweating,confusion and headaches.   This patient was seen by [place] with Dr [name] as a part of collaborative care agreement  Meds ordered this encounter  Medications  . predniSONE (DELTASONE) 10 MG tablet    Sig: Take 0.5 tablets (5 mg total) by mouth daily.    Dispense:  90 tablet    Refill:  3    No new prescription needed at this time.    Order Specific Question:   Supervising Provider    Answer:   [name] [[id number]]    Time spent: 31 Minutes    Dr [name] Internal medicine

## 2019-02-06 ENCOUNTER — Telehealth: Payer: Self-pay

## 2019-02-06 NOTE — Telephone Encounter (Signed)
Is there a way we can contact Dr. Scharlene Gloss office to let him know patient is having increased pain related to rheumatoid arthritis, especially since prednisone dosing was decreased to 5mg  daily. She and her son would like to set up telephone or video visit with them to discuss?

## 2019-02-06 NOTE — Telephone Encounter (Signed)
Sent message to Dr Jefm Bryant

## 2019-02-11 DIAGNOSIS — G8929 Other chronic pain: Secondary | ICD-10-CM | POA: Diagnosis not present

## 2019-02-11 DIAGNOSIS — M25511 Pain in right shoulder: Secondary | ICD-10-CM | POA: Diagnosis not present

## 2019-02-27 DIAGNOSIS — M79674 Pain in right toe(s): Secondary | ICD-10-CM | POA: Diagnosis not present

## 2019-02-27 DIAGNOSIS — M79675 Pain in left toe(s): Secondary | ICD-10-CM | POA: Diagnosis not present

## 2019-02-27 DIAGNOSIS — B351 Tinea unguium: Secondary | ICD-10-CM | POA: Diagnosis not present

## 2019-03-10 ENCOUNTER — Encounter: Payer: Self-pay | Admitting: Nurse Practitioner

## 2019-03-11 ENCOUNTER — Other Ambulatory Visit: Payer: Self-pay | Admitting: Nurse Practitioner

## 2019-03-11 DIAGNOSIS — M353 Polymyalgia rheumatica: Secondary | ICD-10-CM

## 2019-03-11 MED ORDER — PREDNISONE 10 MG PO TABS: ORAL_TABLET | ORAL | 3 refills | Status: DC

## 2019-03-11 NOTE — Progress Notes (Signed)
Sent 90 day prescription for prednisone to [facility].

## 2019-03-14 DIAGNOSIS — M8949 Other hypertrophic osteoarthropathy, multiple sites: Secondary | ICD-10-CM | POA: Diagnosis not present

## 2019-03-25 ENCOUNTER — Other Ambulatory Visit: Payer: Self-pay | Admitting: *Deleted

## 2019-03-25 DIAGNOSIS — Z1231 Encounter for screening mammogram for malignant neoplasm of breast: Secondary | ICD-10-CM

## 2019-03-31 ENCOUNTER — Encounter: Payer: Self-pay | Admitting: Nurse Practitioner

## 2019-04-01 ENCOUNTER — Other Ambulatory Visit: Payer: Self-pay | Admitting: Internal Medicine

## 2019-04-01 DIAGNOSIS — M150 Primary generalized (osteo)arthritis: Secondary | ICD-10-CM

## 2019-04-01 MED ORDER — TRAMADOL HCL 50 MG PO TABS: ORAL_TABLET | ORAL | 0 refills | Status: DC

## 2019-04-16 ENCOUNTER — Other Ambulatory Visit: Payer: Self-pay

## 2019-04-16 ENCOUNTER — Ambulatory Visit (INDEPENDENT_AMBULATORY_CARE_PROVIDER_SITE_OTHER): Payer: Medicare Other | Admitting: Internal Medicine

## 2019-04-16 ENCOUNTER — Encounter: Payer: Self-pay | Admitting: Internal Medicine

## 2019-04-16 VITALS — BP 136/77 | HR 84 | Resp 16

## 2019-04-16 DIAGNOSIS — N189 Chronic kidney disease, unspecified: Secondary | ICD-10-CM | POA: Diagnosis not present

## 2019-04-16 DIAGNOSIS — J449 Chronic obstructive pulmonary disease, unspecified: Secondary | ICD-10-CM

## 2019-04-16 DIAGNOSIS — M353 Polymyalgia rheumatica: Secondary | ICD-10-CM

## 2019-04-16 DIAGNOSIS — J309 Allergic rhinitis, unspecified: Secondary | ICD-10-CM

## 2019-04-16 DIAGNOSIS — J4489 Other specified chronic obstructive pulmonary disease: Secondary | ICD-10-CM

## 2019-04-16 DIAGNOSIS — I10 Essential (primary) hypertension: Secondary | ICD-10-CM | POA: Diagnosis not present

## 2019-04-16 NOTE — Progress Notes (Signed)
[facility] [place], [place] [address]  Internal MEDICINE  Telephone Visit  Patient Name: Stacy [name]  [id number]  [id number]  Date of Service: 04/16/2019  I connected with the patient at  333 by telephone and verified the patients identity using two identifiers.  I discussed the limitations, risks, security and privacy concerns of performing an evaluation and management service by telephone and the availability of in person appointments. I also discussed with the patient that there may be a patient responsible charge related to the service.  The patient expressed understanding and agrees to proceed.    Chief Complaint  Patient presents with  . Telephone Screen  . COPD  . Telephone Assessment    HPI  Pt is seen via telephone for follow up on copd. She reports her breathing is doing well at this time. She is taking her medications without difficulty.  She has oxygen that she uses as needed currently.  She last used it about one week ago.  She reports some DOE, but that appears to be her baseline.       Current Medication: Outpatient Encounter Medications as of 04/16/2019  Medication Sig Note  . acetaminophen (TYLENOL) 325 MG tablet Take 650 mg by mouth every 6 (six) hours as needed for pain.   . benzonatate (TESSALON) 200 MG capsule Take 200 mg by mouth 2 (two) times daily as needed for cough.   . Flaxseed, Linseed, (FLAX SEED OIL PO) Take 1 tablet by mouth daily.    . fluticasone (FLONASE) 50 MCG/ACT nasal spray Place 2 sprays into both nostrils daily.  12/25/2014: Received from: External Pharmacy  . gabapentin (NEURONTIN) 100 MG capsule Take 1 capsule (100 mg total) by mouth 2 (two) times daily.   Marland Kitchen glucose blood (ACCU-CHEK AVIVA PLUS) test strip USE TO CHECK GLUCOSE ONCE DAILY dx e11.65   . hydroxychloroquine (PLAQUENIL) 200 MG tablet Take 1 tablet by mouth daily.   Marland Kitchen lisinopril (PRINIVIL,ZESTRIL) 5 MG tablet Take 1 tablet (5 mg total) by mouth daily.   .  montelukast (SINGULAIR) 10 MG tablet Take 1 tablet (10 mg total) by mouth at bedtime.   . OXYGEN Inhale into the lungs as needed.   . predniSONE (DELTASONE) 10 MG tablet Take 1/2 to 1 tablet po QD   . traMADol (ULTRAM) 50 MG tablet Take 1 tablet po TID prn pain.    No facility-administered encounter medications on file as of 04/16/2019.     Surgical History: Past Surgical History:  Procedure Laterality Date  . BREAST BIOPSY Left 2006   +  . BREAST SURGERY Left 2006   lumpectomy  . CHOLECYSTECTOMY  1969  . COLON SURGERY  2004  . COLONOSCOPY  2014   Dr. Candace Cruise  . EYE SURGERY  2002  . FOOT SURGERY  1996  . HERNIA REPAIR  2013  . left breast biopsy   2011    Medical History: Past Medical History:  Diagnosis Date  . Arthritis 1940  . Asthma   . Bowel trouble   . Cancer [facility]) 2006   DCIS left breast  . Hypertension 1940  . Incisional hernia 2014  . Personal history of radiation therapy   . Polymyalgia ([place])   . Thyroid disorder   . Vitiligo 2012    Family History: Family History  Problem Relation Age of Onset  . Heart disease Mother   . Emphysema Mother   . Arthritis Father   . Cancer Father   .  Breast cancer Neg Hx     Social History   Socioeconomic History  . Marital status: Married    Spouse name: Not on file  . Number of children: Not on file  . Years of education: Not on file  . Highest education level: Not on file  Occupational History  . Not on file  Social Needs  . Financial resource strain: Not on file  . Food insecurity    Worry: Not on file    Inability: Not on file  . Transportation needs    Medical: Not on file    Non-medical: Not on file  Tobacco Use  . Smoking status: Never Smoker  . Smokeless tobacco: Never Used  Substance and Sexual Activity  . Alcohol use: No  . Drug use: No  . Sexual activity: Not on file  Lifestyle  . Physical activity    Days per week: Not on file    Minutes per session: Not on file  . Stress: Not on file   Relationships  . Social Herbalist on phone: Not on file    Gets together: Not on file    Attends religious service: Not on file    Active member of club or organization: Not on file    Attends meetings of clubs or organizations: Not on file    Relationship status: Not on file  . Intimate partner violence    Fear of current or ex partner: Not on file    Emotionally abused: Not on file    Physically abused: Not on file    Forced sexual activity: Not on file  Other Topics Concern  . Not on file  Social History Narrative  . Not on file      Review of Systems  Constitutional: Negative for chills, fatigue and unexpected weight change.  HENT: Negative for congestion, rhinorrhea, sneezing and sore throat.   Eyes: Negative for photophobia, pain and redness.  Respiratory: Negative for cough, chest tightness and shortness of breath.   Cardiovascular: Negative for chest pain and palpitations.  Gastrointestinal: Negative for abdominal pain, constipation, diarrhea, nausea and vomiting.  Endocrine: Negative.   Genitourinary: Negative for dysuria and frequency.  Musculoskeletal: Negative for arthralgias, back pain, joint swelling and neck pain.  Skin: Negative for rash.  Allergic/Immunologic: Negative.   Neurological: Negative for tremors and numbness.  Hematological: Negative for adenopathy. Does not bruise/bleed easily.  Psychiatric/Behavioral: Negative for behavioral problems and sleep disturbance. The patient is not nervous/anxious.     Vital Signs: BP 136/77   Pulse 84   Resp 16    Observation/Objective:  Sounds well.  NAD noted.  Speaking in full sentences.    Assessment/Plan: 1. Obstructive chronic bronchitis without exacerbation ([place]) Advanced disease, continue supportive care at this time.   2. Essential hypertension Stable, continue present management.   3. Polymyalgia rheumatica (HCC) Pt has continued pain, appears at baseline. She will continue  current management.  4. Allergic rhinitis, unspecified seasonality, unspecified trigger Stable, Singulair as prescribed.   5. Chronic kidney disease, unspecified CKD stage Continue supportive care.  General Counseling: marzell allemand understanding of the findings of today's phone visit and agrees with plan of treatment. I have discussed any further diagnostic evaluation that may be needed or ordered today. We also reviewed her medications today. she has been encouraged to call the office with any questions or concerns that should arise related to todays visit.    No orders of the defined types were placed  in this encounter.   No orders of the defined types were placed in this encounter.   Time spent: [place] [facility] Internal medicine

## 2019-05-14 ENCOUNTER — Ambulatory Visit: Payer: Medicare Other | Admitting: General Surgery

## 2019-06-17 DIAGNOSIS — M353 Polymyalgia rheumatica: Secondary | ICD-10-CM | POA: Diagnosis not present

## 2019-06-17 DIAGNOSIS — M25511 Pain in right shoulder: Secondary | ICD-10-CM | POA: Diagnosis not present

## 2019-06-17 DIAGNOSIS — G8929 Other chronic pain: Secondary | ICD-10-CM | POA: Diagnosis not present

## 2019-06-17 DIAGNOSIS — M25512 Pain in left shoulder: Secondary | ICD-10-CM | POA: Diagnosis not present

## 2019-06-17 DIAGNOSIS — M1990 Unspecified osteoarthritis, unspecified site: Secondary | ICD-10-CM | POA: Diagnosis not present

## 2019-06-17 DIAGNOSIS — M8949 Other hypertrophic osteoarthropathy, multiple sites: Secondary | ICD-10-CM | POA: Diagnosis not present

## 2019-06-30 ENCOUNTER — Encounter: Payer: Self-pay | Admitting: Nurse Practitioner

## 2019-07-01 ENCOUNTER — Other Ambulatory Visit: Payer: Self-pay | Admitting: Nurse Practitioner

## 2019-07-01 DIAGNOSIS — M150 Primary generalized (osteo)arthritis: Secondary | ICD-10-CM

## 2019-07-01 MED ORDER — TRAMADOL HCL 50 MG PO TABS: ORAL_TABLET | ORAL | 0 refills | Status: DC

## 2019-07-01 NOTE — Progress Notes (Signed)
Renewed tramadol 50mg  tID prn and sent to [facility].

## 2019-07-03 ENCOUNTER — Other Ambulatory Visit: Payer: Self-pay

## 2019-07-08 ENCOUNTER — Ambulatory Visit: Payer: Self-pay | Admitting: Nurse Practitioner

## 2019-07-30 ENCOUNTER — Ambulatory Visit (INDEPENDENT_AMBULATORY_CARE_PROVIDER_SITE_OTHER): Payer: Medicare Other | Admitting: Internal Medicine

## 2019-07-30 ENCOUNTER — Other Ambulatory Visit: Payer: Self-pay

## 2019-07-30 ENCOUNTER — Encounter: Payer: Self-pay | Admitting: Internal Medicine

## 2019-07-30 VITALS — BP 114/66 | HR 82

## 2019-07-30 DIAGNOSIS — G4734 Idiopathic sleep related nonobstructive alveolar hypoventilation: Secondary | ICD-10-CM | POA: Diagnosis not present

## 2019-07-30 DIAGNOSIS — J449 Chronic obstructive pulmonary disease, unspecified: Secondary | ICD-10-CM | POA: Diagnosis not present

## 2019-07-30 DIAGNOSIS — M159 Polyosteoarthritis, unspecified: Secondary | ICD-10-CM

## 2019-07-30 NOTE — Patient Instructions (Signed)
Chronic Obstructive Pulmonary Disease °Chronic obstructive pulmonary disease (COPD) is a long-term (chronic) lung problem. When you have COPD, it is hard for air to get in and out of your lungs. Usually the condition gets worse over time, and your lungs will never return to normal. There are things you can do to keep yourself as healthy as possible. °· Your doctor may treat your condition with: °? Medicines. °? Oxygen. °? Lung surgery. °· Your doctor may also recommend: °? Rehabilitation. This includes steps to make your body work better. It may involve a team of specialists. °? Quitting smoking, if you smoke. °? Exercise and changes to your diet. °? Comfort measures (palliative care). °Follow these instructions at home: °Medicines °· Take over-the-counter and prescription medicines only as told by your doctor. °· Talk to your doctor before taking any cough or allergy medicines. You may need to avoid medicines that cause your lungs to be dry. °Lifestyle °· If you smoke, stop. Smoking makes the problem worse. If you need help quitting, ask your doctor. °· Avoid being around things that make your breathing worse. This may include smoke, chemicals, and fumes. °· Stay active, but remember to rest as well. °· Learn and use tips on how to relax. °· Make sure you get enough sleep. Most adults need at least 7 hours of sleep every night. °· Eat healthy foods. Eat smaller meals more often. Rest before meals. °Controlled breathing °Learn and use tips on how to control your breathing as told by your doctor. Try: °· Breathing in (inhaling) through your nose for 1 second. Then, pucker your lips and breath out (exhale) through your lips for 2 seconds. °· Putting one hand on your belly (abdomen). Breathe in slowly through your nose for 1 second. Your hand on your belly should move out. Pucker your lips and breathe out slowly through your lips. Your hand on your belly should move in as you breathe out. ° °Controlled coughing °Learn  and use controlled coughing to clear mucus from your lungs. Follow these steps: °1. Lean your head a little forward. °2. Breathe in deeply. °3. Try to hold your breath for 3 seconds. °4. Keep your mouth slightly open while coughing 2 times. °5. Spit any mucus out into a tissue. °6. Rest and do the steps again 1 or 2 times as needed. °General instructions °· Make sure you get all the shots (vaccines) that your doctor recommends. Ask your doctor about a flu shot and a pneumonia shot. °· Use oxygen therapy and pulmonary rehabilitation if told by your doctor. If you need home oxygen therapy, ask your doctor if you should buy a tool to measure your oxygen level (oximeter). °· Make a COPD action plan with your doctor. This helps you to know what to do if you feel worse than usual. °· Manage any other conditions you have as told by your doctor. °· Avoid going outside when it is very hot, cold, or humid. °· Avoid people who have a sickness you can catch (contagious). °· Keep all follow-up visits as told by your doctor. This is important. °Contact a doctor if: °· You cough up more mucus than usual. °· There is a change in the color or thickness of the mucus. °· It is harder to breathe than usual. °· Your breathing is faster than usual. °· You have trouble sleeping. °· You need to use your medicines more often than usual. °· You have trouble doing your normal activities such as getting dressed   or walking around the house. °Get help right away if: °· You have shortness of breath while resting. °· You have shortness of breath that stops you from: °? Being able to talk. °? Doing normal activities. °· Your chest hurts for longer than 5 minutes. °· Your skin color is more blue than usual. °· Your pulse oximeter shows that you have low oxygen for longer than 5 minutes. °· You have a fever. °· You feel too tired to breathe normally. °Summary °· Chronic obstructive pulmonary disease (COPD) is a long-term lung problem. °· The way your  lungs work will never return to normal. Usually the condition gets worse over time. There are things you can do to keep yourself as healthy as possible. °· Take over-the-counter and prescription medicines only as told by your doctor. °· If you smoke, stop. Smoking makes the problem worse. °This information is not intended to replace advice given to you by your health care provider. Make sure you discuss any questions you have with your health care provider. °Document Released: 02/29/2008 Document Revised: 08/25/2017 Document Reviewed: 10/17/2016 °Elsevier Patient Education © 2020 [employer] Inc[employer] ° °

## 2019-07-30 NOTE — Progress Notes (Signed)
[facility] [place], [place] [address]  Internal MEDICINE  Telephone Visit  Patient Name: Stacy [name]  [id number]  YI:[id number]  Date of Service: 07/30/2019  I connected with the patient at 440 by telephone and verified the patients identity using two identifiers.   I discussed the limitations, risks, security and privacy concerns of performing an evaluation and management service by telephone and the availability of in person appointments. I also discussed with the patient that there may be a patient responsible charge related to the service.  The patient expressed understanding and agrees to proceed.    Chief Complaint  Patient presents with  . Asthma  . Follow-up    Need PFT    HPI SOB with exertion. She is using her inhalers with relief. She has been having pain her legs when she walks or even at rest. She feels the ice helps. She states she is not able to take NSAIDS only takes tylenol for the pain.  The patient has no cough no congestion at this time.  She has the claudications as already mentioned above.  Patient has been using her medications as prescribed.  We did review the medications currently she is on oxygen as prescribed.    Current Medication: Outpatient Encounter Medications as of 07/30/2019  Medication Sig Note  . acetaminophen (TYLENOL) 325 MG tablet Take 650 mg by mouth every 6 (six) hours as needed for pain.   . benzonatate (TESSALON) 200 MG capsule Take 200 mg by mouth 2 (two) times daily as needed for cough.   . Flaxseed, Linseed, (FLAX SEED OIL PO) Take 1 tablet by mouth daily.    . fluticasone (FLONASE) 50 MCG/ACT nasal spray Place 2 sprays into both nostrils daily.  12/25/2014: Received from: External Pharmacy  . gabapentin (NEURONTIN) 100 MG capsule Take 1 capsule (100 mg total) by mouth 2 (two) times daily.   Marland Kitchen glucose blood (ACCU-CHEK AVIVA PLUS) test strip USE TO CHECK GLUCOSE ONCE DAILY dx e11.65   . hydroxychloroquine (PLAQUENIL)  200 MG tablet Take 1 tablet by mouth daily.   Marland Kitchen lisinopril (PRINIVIL,ZESTRIL) 5 MG tablet Take 1 tablet (5 mg total) by mouth daily.   . montelukast (SINGULAIR) 10 MG tablet Take 1 tablet (10 mg total) by mouth at bedtime.   . OXYGEN Inhale into the lungs as needed.   . predniSONE (DELTASONE) 10 MG tablet Take 1/2 to 1 tablet po QD   . traMADol (ULTRAM) 50 MG tablet Take 1 tablet po TID prn pain.    No facility-administered encounter medications on file as of 07/30/2019.     Surgical History: Past Surgical History:  Procedure Laterality Date  . BREAST BIOPSY Left 2006   +  . BREAST SURGERY Left 2006   lumpectomy  . CHOLECYSTECTOMY  1969  . COLON SURGERY  2004  . COLONOSCOPY  2014   Dr. Candace Cruise  . EYE SURGERY  2002  . FOOT SURGERY  1996  . HERNIA REPAIR  2013  . left breast biopsy   2011    Medical History: Past Medical History:  Diagnosis Date  . Arthritis 1940  . Asthma   . Bowel trouble   . Cancer [facility]) 2006   DCIS left breast  . Hypertension 1940  . Incisional hernia 2014  . Personal history of radiation therapy   . Polymyalgia ([place])   . Thyroid disorder   . Vitiligo 2012    Family History: Family History  Problem Relation Age of  Onset  . Heart disease Mother   . Emphysema Mother   . Arthritis Father   . Cancer Father   . Breast cancer Neg Hx     Social History   Socioeconomic History  . Marital status: Married    Spouse name: Not on file  . Number of children: Not on file  . Years of education: Not on file  . Highest education level: Not on file  Occupational History  . Not on file  Social Needs  . Financial resource strain: Not on file  . Food insecurity    Worry: Not on file    Inability: Not on file  . Transportation needs    Medical: Not on file    Non-medical: Not on file  Tobacco Use  . Smoking status: Never Smoker  . Smokeless tobacco: Never Used  Substance and Sexual Activity  . Alcohol use: No  . Drug use: No  . Sexual activity: Not  on file  Lifestyle  . Physical activity    Days per week: Not on file    Minutes per session: Not on file  . Stress: Not on file  Relationships  . Social Herbalist on phone: Not on file    Gets together: Not on file    Attends religious service: Not on file    Active member of club or organization: Not on file    Attends meetings of clubs or organizations: Not on file    Relationship status: Not on file  . Intimate partner violence    Fear of current or ex partner: Not on file    Emotionally abused: Not on file    Physically abused: Not on file    Forced sexual activity: Not on file  Other Topics Concern  . Not on file  Social History Narrative  . Not on file      Review of Systems Complete review of system was performed. No headache no dizziness No sore mouth. Positive shortness of breath positive for exertional dyspnea No chest pain. No swelling of the legs at this time No nausea no vomiting no diarrhea  Vital Signs: BP 114/66   Pulse 82    Observation/Objective: Appeared in mild respiratory distress. She was using her oxygen. No obvious facial deformities. Slight increased work of breathing was noted.    Assessment/Plan: COPD advanced disease continue encouraging using of oxygen.  She needs to continue with inhalers as some tolerated.  Osteoarthritis stable at this time as needed nonsteroidal medications for her arthritic symptoms  Oxygen dependent she uses the oxygen as prescribed we will continue to monitor closely.  General Counseling: rhyli ferreras understanding of the findings of today's phone visit and agrees with plan of treatment. I have discussed any further diagnostic evaluation that may be needed or ordered today. We also reviewed her medications today. she has been encouraged to call the office with any questions or concerns that should arise related to todays visit.    No orders of the defined types were placed in this  encounter.   No orders of the defined types were placed in this encounter.   Time spent:15 [place] MD [facility] Pulmonary Medicine

## 2019-10-01 ENCOUNTER — Other Ambulatory Visit: Payer: Self-pay

## 2019-10-01 ENCOUNTER — Other Ambulatory Visit: Payer: Self-pay | Admitting: Nurse Practitioner

## 2019-10-01 DIAGNOSIS — M150 Primary generalized (osteo)arthritis: Secondary | ICD-10-CM

## 2019-10-01 MED ORDER — TRAMADOL HCL 50 MG PO TABS: ORAL_TABLET | ORAL | 0 refills | Status: DC

## 2019-10-01 NOTE — Progress Notes (Signed)
Renewed singel 30 day prescription for tramadol and sent to [facility]. Patient should be seen in office for further refills.

## 2019-10-02 ENCOUNTER — Telehealth: Payer: Self-pay

## 2019-10-02 NOTE — Telephone Encounter (Signed)
Confirmed appointment with patient. klh °

## 2019-10-03 ENCOUNTER — Encounter: Payer: Self-pay | Admitting: Nurse Practitioner

## 2019-10-03 ENCOUNTER — Ambulatory Visit (INDEPENDENT_AMBULATORY_CARE_PROVIDER_SITE_OTHER): Payer: Medicare Other | Admitting: Nurse Practitioner

## 2019-10-03 ENCOUNTER — Other Ambulatory Visit: Payer: Self-pay

## 2019-10-03 VITALS — BP 138/91 | HR 93 | Temp 96.9°F | Ht 64.0 in

## 2019-10-03 DIAGNOSIS — M150 Primary generalized (osteo)arthritis: Secondary | ICD-10-CM | POA: Diagnosis not present

## 2019-10-03 DIAGNOSIS — R531 Weakness: Secondary | ICD-10-CM

## 2019-10-03 DIAGNOSIS — I10 Essential (primary) hypertension: Secondary | ICD-10-CM

## 2019-10-03 DIAGNOSIS — R269 Unspecified abnormalities of gait and mobility: Secondary | ICD-10-CM

## 2019-10-03 DIAGNOSIS — M353 Polymyalgia rheumatica: Secondary | ICD-10-CM

## 2019-10-03 DIAGNOSIS — E119 Type 2 diabetes mellitus without complications: Secondary | ICD-10-CM | POA: Diagnosis not present

## 2019-10-03 NOTE — Progress Notes (Addendum)
[facility] [place], [place] [address]  Internal MEDICINE  Telephone Visit  Patient Name: Stacy [name]  [id number]  YI:[id number]  Date of Service: 10/06/2019  I connected with the patient at 3:12pm by telephone and verified the patients identity using two identifiers.   I discussed the limitations, risks, security and privacy concerns of performing an evaluation and management service by telephone and the availability of in person appointments. I also discussed with the patient that there may be a patient responsible charge related to the service.  The patient expressed understanding and agrees to proceed.    Chief Complaint  Patient presents with  . Telephone Assessment  . Telephone Screen  . Diabetes  . Hypertension  . Leg Pain    The patient has been contacted via telephone for follow up visit due to concerns for spread of novel coronavirus. The patient presents for follow up visit. The patient continues to have severe arthritic pain in multiple joints.  She states that pain is so bad, she can hardly walk. Has to use the walls or furniture for support. Hard for her to use a can as her arms get sore and give out. She continues to take Tramadol 50mg  three times daily as needed for pain. This is helping some. Gets worse when she has to sit down for long periods of time. The patient continues to see rheumatology. Was recently started on hydroxychloroquine. She is also taking up to 5mg  prednisone daily.  She continues to check her blood sugars every day. Consistently running between 80 and 90. Her blood pressure continues to be well controlled.        Current Medication: Outpatient Encounter Medications as of 10/03/2019  Medication Sig Note  . acetaminophen (TYLENOL) 325 MG tablet Take 650 mg by mouth every 6 (six) hours as needed for pain.   . benzonatate (TESSALON) 200 MG capsule Take 200 mg by mouth 2 (two) times daily as needed for cough.   . Flaxseed, Linseed,  (FLAX SEED OIL PO) Take 1 tablet by mouth daily.    . fluticasone (FLONASE) 50 MCG/ACT nasal spray Place 2 sprays into both nostrils daily.  12/25/2014: Received from: External Pharmacy  . gabapentin (NEURONTIN) 100 MG capsule Take 1 capsule (100 mg total) by mouth 2 (two) times daily.   Marland Kitchen glucose blood (ACCU-CHEK AVIVA PLUS) test strip USE TO CHECK GLUCOSE ONCE DAILY dx e11.65   . hydroxychloroquine (PLAQUENIL) 200 MG tablet Take 1 tablet by mouth daily.   Marland Kitchen lisinopril (PRINIVIL,ZESTRIL) 5 MG tablet Take 1 tablet (5 mg total) by mouth daily.   . montelukast (SINGULAIR) 10 MG tablet Take 1 tablet (10 mg total) by mouth at bedtime.   . OXYGEN Inhale into the lungs as needed.   . predniSONE (DELTASONE) 10 MG tablet Take 1/2 to 1 tablet po QD   . traMADol (ULTRAM) 50 MG tablet Take 1 tablet po TID prn pain.    No facility-administered encounter medications on file as of 10/03/2019.    Surgical History: Past Surgical History:  Procedure Laterality Date  . BREAST BIOPSY Left 2006   +  . BREAST SURGERY Left 2006   lumpectomy  . CHOLECYSTECTOMY  1969  . COLON SURGERY  2004  . COLONOSCOPY  2014   Dr. Candace Cruise  . EYE SURGERY  2002  . FOOT SURGERY  1996  . HERNIA REPAIR  2013  . left breast biopsy   2011    Medical History: Past Medical  History:  Diagnosis Date  . Arthritis 1940  . Asthma   . Bowel trouble   . Cancer [facility]) 2006   DCIS left breast  . Hypertension 1940  . Incisional hernia 2014  . Personal history of radiation therapy   . Polymyalgia ([place])   . Thyroid disorder   . Vitiligo 2012    Family History: Family History  Problem Relation Age of Onset  . Heart disease Mother   . Emphysema Mother   . Arthritis Father   . Cancer Father   . Breast cancer Neg Hx     Social History   Socioeconomic History  . Marital status: Married    Spouse name: Not on file  . Number of children: Not on file  . Years of education: Not on file  . Highest education level: Not on file   Occupational History  . Not on file  Tobacco Use  . Smoking status: Never Smoker  . Smokeless tobacco: Never Used  Substance and Sexual Activity  . Alcohol use: No  . Drug use: No  . Sexual activity: Not on file  Other Topics Concern  . Not on file  Social History Narrative  . Not on file   Social Determinants of Health   Financial Resource Strain:   . Difficulty of Paying Living Expenses: Not on file  Food Insecurity:   . Worried About Charity fundraiser in the Last Year: Not on file  . Ran Out of Food in the Last Year: Not on file  Transportation Needs:   . Lack of Transportation (Medical): Not on file  . Lack of Transportation (Non-Medical): Not on file  Physical Activity:   . Days of Exercise per Week: Not on file  . Minutes of Exercise per Session: Not on file  Stress:   . Feeling of Stress : Not on file  Social Connections:   . Frequency of Communication with Friends and Family: Not on file  . Frequency of Social Gatherings with Friends and Family: Not on file  . Attends Religious Services: Not on file  . Active Member of Clubs or Organizations: Not on file  . Attends Archivist Meetings: Not on file  . Marital Status: Not on file  Intimate Partner Violence:   . Fear of Current or Ex-Partner: Not on file  . Emotionally Abused: Not on file  . Physically Abused: Not on file  . Sexually Abused: Not on file      Review of Systems  Constitutional: Positive for activity change and fatigue. Negative for unexpected weight change.  HENT: Negative for congestion, rhinorrhea and sore throat.   Respiratory: Negative for chest tightness, shortness of breath and wheezing.   Cardiovascular: Negative for chest pain and palpitations.  Gastrointestinal: Negative for nausea and vomiting.  Endocrine: Negative for cold intolerance, heat intolerance, polydipsia and polyuria.       Well controlled blood sugars.   Musculoskeletal: Positive for arthralgias, gait  problem, joint swelling and myalgias.       Patient having severe joint pain. Pain is so severe she can hardly get up and walk some days. She does continue to see rheumatology. She was recently started on hydroxychloroquine. The baseline pain levels have improved some.   Skin: Negative for rash.  Allergic/Immunologic: Negative for environmental allergies.  Neurological: Positive for weakness. Negative for headaches.  Hematological: Negative for adenopathy.  Psychiatric/Behavioral: Positive for sleep disturbance. Negative for dysphoric mood. The patient is not nervous/anxious.  Today's Vitals   10/03/19 1432  BP: (!) 138/91  Pulse: 93  Temp: (!) 96.9 F (36.1 C)  Height: 5\' 4"  (1.626 m)   Body mass index is 28.15 kg/m.  Observation/Objective:    The patient is alert and oriented. She is pleasant and answers all questions appropriately. Breathing is non-labored. She is in no acute distress at this time.   Assessment/Plan:  1. Type 2 diabetes mellitus without complication, without long-term current use of insulin (HCC) Blood sugars managed well. Continue to monitor closely.  2. Essential hypertension Stable. Continue bp medication as prescribed   3. Polymyalgia rheumatica ([place]) conitneu regular visits with rheumatology as scheduled.   4. Primary generalized (osteo)arthritis May continue tramadol 50mg  up to three times daily as needed for pain management.   5. Abnormality of gait and mobility Patient assessed today regarding her need for a rolling walker with seat. She she has increased joint pain with limited ability to walk without significant assistance. She would greatly benefit from a rolling walker with seat. An order has been placed through [facility] and sent ot her DME provider.  6. Weakness  Patient assessed today regarding her need for a rolling walker with seat. She she has increased joint pain with limited ability to walk without significant assistance. She would  greatly benefit from a rolling walker with seat. An order has been placed through [facility] and sent ot her DME provide  General Counseling: Jourden verbalizes understanding of the findings of today's phone visit and agrees with plan of treatment. I have discussed any further diagnostic evaluation that may be needed or ordered today. We also reviewed her medications today. she has been encouraged to call the office with any questions or concerns that should arise related to todays visit.  This patient was seen by Leretha Pol FNP Collaboration with Dr [name] as a part of collaborative care agreement  Time spent: 67 Minutes    Dr [name] Internal medicine

## 2019-10-06 MED ORDER — TRAMADOL HCL 50 MG PO TABS: ORAL_TABLET | ORAL | 0 refills | Status: DC

## 2019-10-17 ENCOUNTER — Telehealth: Payer: Self-pay

## 2019-10-17 NOTE — Telephone Encounter (Signed)
FAXED OVER ORDER FOR WALKER WITH SEAT WITH DEMOGRAPHIC SHEET. WILL FAX OVER NOTE ONCE IT IS SIGNED.

## 2019-10-17 NOTE — Telephone Encounter (Signed)
FAXED OVER ORDER AND DEMO SHEET. WILL FAX VISIT NOTE ONCE ITS SIGNED.

## 2019-10-17 NOTE — Addendum Note (Signed)
Addended by: Leretha Pol on: 10/17/2019 01:02 PM   Modules accepted: Orders

## 2019-10-18 ENCOUNTER — Telehealth: Payer: Self-pay

## 2019-10-18 NOTE — Telephone Encounter (Signed)
FAXED OVER NOTE FOR WHEELCHAIR TODAY TO [facility].

## 2019-10-30 ENCOUNTER — Telehealth: Payer: Self-pay

## 2019-10-30 ENCOUNTER — Other Ambulatory Visit: Payer: Self-pay | Admitting: Nurse Practitioner

## 2019-10-30 DIAGNOSIS — M150 Primary generalized (osteo)arthritis: Secondary | ICD-10-CM

## 2019-10-30 MED ORDER — TRAMADOL HCL 50 MG PO TABS: ORAL_TABLET | ORAL | 1 refills | Status: DC

## 2019-10-30 NOTE — Progress Notes (Signed)
Renewed prescription for tramadol with 90 day prescription and sent to [facility]

## 2019-10-30 NOTE — Telephone Encounter (Signed)
Renewed prescription for tramadol with 90 day prescription and sent to [facility]

## 2019-10-30 NOTE — Telephone Encounter (Signed)
Pt advised we send 90 days pres for tramadol

## 2019-11-07 ENCOUNTER — Telehealth: Payer: Self-pay

## 2019-11-07 ENCOUNTER — Other Ambulatory Visit: Payer: Self-pay

## 2019-11-07 DIAGNOSIS — M150 Primary generalized (osteo)arthritis: Secondary | ICD-10-CM

## 2019-11-07 NOTE — Telephone Encounter (Signed)
Called [facility] that what status for pt refills we send it they said they don't have I canceled if they received we can send to local phar

## 2019-11-08 MED ORDER — TRAMADOL HCL 50 MG PO TABS: ORAL_TABLET | ORAL | 1 refills | Status: DC

## 2019-11-21 ENCOUNTER — Other Ambulatory Visit: Payer: Self-pay

## 2019-11-21 DIAGNOSIS — M353 Polymyalgia rheumatica: Secondary | ICD-10-CM

## 2019-11-21 MED ORDER — GABAPENTIN 100 MG PO CAPS: 100.0000 mg | ORAL_CAPSULE | Freq: Two times a day (BID) | ORAL | 3 refills | Status: DC

## 2019-11-25 ENCOUNTER — Telehealth: Payer: Self-pay

## 2019-11-25 NOTE — Telephone Encounter (Signed)
[facility]ed [facility] for walker with seat as per [facility] is not covered

## 2019-11-26 ENCOUNTER — Telehealth: Payer: Self-pay

## 2019-11-26 NOTE — Telephone Encounter (Signed)
Spoke with pt and advised that [facility] and her walker with seat on the way  delivered to her house

## 2020-01-07 ENCOUNTER — Telehealth: Payer: Self-pay

## 2020-01-07 NOTE — Telephone Encounter (Signed)
Patient cancelled appointment on 01/31/2020 will call back to reschedule. klh

## 2020-01-20 ENCOUNTER — Other Ambulatory Visit: Payer: Self-pay

## 2020-01-20 MED ORDER — ACCU-CHEK AVIVA PLUS VI STRP: ORAL_STRIP | 3 refills | Status: DC

## 2020-01-22 ENCOUNTER — Telehealth: Payer: Self-pay

## 2020-01-22 NOTE — Telephone Encounter (Signed)
Pt son called stating that [facility] sent a 30 day fill of tramadol, but pt gets a 90 day fill.  [facility] and verified that a 30 day fill was shipped out on 01/20/20 because of auto refill, but this is the last fill pharmacy has on file.  Pt was [facility] a [facility] day [facility] day with 1 refill. I called [facility] to cancel the refill.   Advised pt son that [facility] a 30 day fill for $3 on 01/20/20 and that is the last fill they have on file. Advised pt son since they already have a 52 day fill from 11/11/19 that they should have enough until 02/08/20 and with the mail order coming in they should have that until 03/10/20.Pt son stated that they no longer want to pick up from [facility] due to price and Notified pt son that I will cancel the refill at [place].  Called [facility] and canceled refill. Pt should now have tramadol until 03/10/20.

## 2020-01-24 ENCOUNTER — Telehealth: Payer: Self-pay

## 2020-01-24 NOTE — Telephone Encounter (Signed)
Confirmed appointment on 01/28/2020 and screened for covid. klh

## 2020-01-28 ENCOUNTER — Ambulatory Visit: Payer: Medicare Other | Admitting: Internal Medicine

## 2020-01-31 ENCOUNTER — Ambulatory Visit: Payer: Medicare Other | Admitting: Nurse Practitioner

## 2020-01-31 ENCOUNTER — Other Ambulatory Visit: Payer: Self-pay

## 2020-01-31 MED ORDER — ACCU-CHEK AVIVA PLUS VI STRP: ORAL_STRIP | 3 refills | Status: DC

## 2020-02-28 ENCOUNTER — Other Ambulatory Visit: Payer: Self-pay

## 2020-02-28 DIAGNOSIS — M353 Polymyalgia rheumatica: Secondary | ICD-10-CM

## 2020-02-28 DIAGNOSIS — M150 Primary generalized (osteo)arthritis: Secondary | ICD-10-CM

## 2020-02-28 MED ORDER — PREDNISONE 10 MG PO TABS: ORAL_TABLET | ORAL | 3 refills | Status: DC

## 2020-03-03 MED ORDER — TRAMADOL HCL 50 MG PO TABS: ORAL_TABLET | ORAL | 1 refills | Status: DC

## 2020-03-09 ENCOUNTER — Encounter: Payer: Self-pay | Admitting: Internal Medicine

## 2020-03-09 ENCOUNTER — Ambulatory Visit (INDEPENDENT_AMBULATORY_CARE_PROVIDER_SITE_OTHER): Payer: Medicare Other | Admitting: Internal Medicine

## 2020-03-09 VITALS — BP 117/64 | HR 82 | Temp 97.8°F | Ht 64.0 in | Wt 164.0 lb

## 2020-03-09 DIAGNOSIS — G4734 Idiopathic sleep related nonobstructive alveolar hypoventilation: Secondary | ICD-10-CM | POA: Diagnosis not present

## 2020-03-09 DIAGNOSIS — J309 Allergic rhinitis, unspecified: Secondary | ICD-10-CM | POA: Diagnosis not present

## 2020-03-09 DIAGNOSIS — J449 Chronic obstructive pulmonary disease, unspecified: Secondary | ICD-10-CM | POA: Diagnosis not present

## 2020-03-09 NOTE — Progress Notes (Addendum)
[facility] [place], [place] [address]  Internal MEDICINE  Telephone Visit  Patient Name: Stacy [name]  [id number]  [id number]  Date of Service: 03/09/2020  I connected with the patient at 1207 by telephone and verified the patients identity using two identifiers.   I discussed the limitations, risks, security and privacy concerns of performing an evaluation and management service by telephone and the availability of in person appointments. I also discussed with the patient that there may be a patient responsible charge related to the service.  The patient expressed understanding and agrees to proceed.    Chief Complaint  Patient presents with  . Telephone Assessment    dizzy in the morning and most of the day   . Telephone Screen  . Asthma    HPI  Pt is seen via telephone for pulmonary visit.  She reports her breathing has been at baseline. She continues to use oxygen 2 LPM as needed. She mostly uses it with walking, and sometimes at night.  She has been having some dizziness in the morning for the last two mornings.  It resolved quickly.       Current Medication: Outpatient Encounter Medications as of 03/09/2020  Medication Sig Note  . acetaminophen (TYLENOL) 325 MG tablet Take 650 mg by mouth every 6 (six) hours as needed for pain.   . benzonatate (TESSALON) 200 MG capsule Take 200 mg by mouth 2 (two) times daily as needed for cough.   . Flaxseed, Linseed, (FLAX SEED OIL PO) Take 1 tablet by mouth daily.    . fluticasone (FLONASE) 50 MCG/ACT nasal spray Place 2 sprays into both nostrils daily.  12/25/2014: Received from: External Pharmacy  . gabapentin (NEURONTIN) 100 MG capsule Take 1 capsule (100 mg total) by mouth 2 (two) times daily.   Marland Kitchen glucose blood (ACCU-CHEK AVIVA PLUS) test strip USE TO CHECK GLUCOSE ONCE DAILY dx e11.65   . hydroxychloroquine (PLAQUENIL) 200 MG tablet Take 1 tablet by mouth daily.   Marland Kitchen lisinopril (PRINIVIL,ZESTRIL) 5 MG tablet  Take 1 tablet (5 mg total) by mouth daily.   . OXYGEN Inhale into the lungs as needed.   . predniSONE (DELTASONE) 10 MG tablet Take 1/2 to 1 tablet po QD   . traMADol (ULTRAM) 50 MG tablet Take 1 tablet po TID prn pain.   . montelukast (SINGULAIR) 10 MG tablet Take 1 tablet (10 mg total) by mouth at bedtime.    No facility-administered encounter medications on file as of 03/09/2020.    Surgical History: Past Surgical History:  Procedure Laterality Date  . BREAST BIOPSY Left 2006   +  . BREAST SURGERY Left 2006   lumpectomy  . CHOLECYSTECTOMY  1969  . COLON SURGERY  2004  . COLONOSCOPY  2014   Dr. Candace Cruise  . EYE SURGERY  2002  . FOOT SURGERY  1996  . HERNIA REPAIR  2013  . left breast biopsy   2011    Medical History: Past Medical History:  Diagnosis Date  . Arthritis 1940  . Asthma   . Bowel trouble   . Cancer [facility]) 2006   DCIS left breast  . Hypertension 1940  . Incisional hernia 2014  . Personal history of radiation therapy   . Polymyalgia ([place])   . Thyroid disorder   . Vitiligo 2012    Family History: Family History  Problem Relation Age of Onset  . Heart disease Mother   . Emphysema Mother   . Arthritis  Father   . Cancer Father   . Breast cancer Neg Hx     Social History   Socioeconomic History  . Marital status: Married    Spouse name: Not on file  . Number of children: Not on file  . Years of education: Not on file  . Highest education level: Not on file  Occupational History  . Not on file  Tobacco Use  . Smoking status: Never Smoker  . Smokeless tobacco: Never Used  Vaping Use  . Vaping Use: Never used  Substance and Sexual Activity  . Alcohol use: No  . Drug use: No  . Sexual activity: Not on file  Other Topics Concern  . Not on file  Social History Narrative  . Not on file   Social Determinants of Health   Financial Resource Strain:   . Difficulty of Paying Living Expenses:   Food Insecurity:   . Worried About Charity fundraiser in  the Last Year:   . Arboriculturist in the Last Year:   Transportation Needs:   . Film/video editor (Medical):   Marland Kitchen Lack of Transportation (Non-Medical):   Physical Activity:   . Days of Exercise per Week:   . Minutes of Exercise per Session:   Stress:   . Feeling of Stress :   Social Connections:   . Frequency of Communication with Friends and Family:   . Frequency of Social Gatherings with Friends and Family:   . Attends Religious Services:   . Active Member of Clubs or Organizations:   . Attends Archivist Meetings:   Marland Kitchen Marital Status:   Intimate Partner Violence:   . Fear of Current or Ex-Partner:   . Emotionally Abused:   Marland Kitchen Physically Abused:   . Sexually Abused:       Review of Systems  Constitutional: Negative for chills, fatigue and unexpected weight change.  HENT: Negative for congestion, rhinorrhea, sneezing and sore throat.   Eyes: Negative for photophobia, pain and redness.  Respiratory: Negative for cough, chest tightness and shortness of breath.   Cardiovascular: Negative for chest pain and palpitations.  Gastrointestinal: Negative for abdominal pain, constipation, diarrhea, nausea and vomiting.  Endocrine: Negative.   Genitourinary: Negative for dysuria and frequency.  Musculoskeletal: Negative for arthralgias, back pain, joint swelling and neck pain.  Skin: Negative for rash.  Allergic/Immunologic: Negative.   Neurological: Negative for tremors and numbness.  Hematological: Negative for adenopathy. Does not bruise/bleed easily.  Psychiatric/Behavioral: Negative for behavioral problems and sleep disturbance. The patient is not nervous/anxious.     Vital Signs: BP 117/64   Pulse 82   Temp 97.8 F (36.6 C)   Ht 5\' 4"  (1.626 m)   Wt 164 lb (74.4 kg)   BMI 28.15 kg/m    Observation/Objective:  Well sounding, NAD at this time.    Assessment/Plan: 1. Chronic obstructive pulmonary disease, unspecified COPD type ([place]) Continue present  management.  2. Nocturnal hypoxemia Stable, continue to use oxygen as prescribed.  3. Allergic rhinitis, unspecified seasonality, unspecified trigger No new symptoms.   General Counseling: hedy garro understanding of the findings of today's phone visit and agrees with plan of treatment. I have discussed any further diagnostic evaluation that may be needed or ordered today. We also reviewed her medications today. she has been encouraged to call the office with any questions or concerns that should arise related to todays visit.    No orders of the defined types were placed in  this encounter.   No orders of the defined types were placed in this encounter.   Time spent: 79 Minutes    Dr [name] Internal medicine

## 2020-03-12 DIAGNOSIS — Z961 Presence of intraocular lens: Secondary | ICD-10-CM | POA: Diagnosis not present

## 2020-06-09 ENCOUNTER — Other Ambulatory Visit: Payer: Self-pay

## 2020-06-09 DIAGNOSIS — M150 Primary generalized (osteo)arthritis: Secondary | ICD-10-CM

## 2020-06-09 MED ORDER — TRAMADOL HCL 50 MG PO TABS: ORAL_TABLET | ORAL | 0 refills | Status: DC

## 2020-07-09 DIAGNOSIS — S6992XA Unspecified injury of left wrist, hand and finger(s), initial encounter: Secondary | ICD-10-CM | POA: Diagnosis not present

## 2020-08-17 ENCOUNTER — Other Ambulatory Visit: Payer: Self-pay

## 2020-08-17 ENCOUNTER — Encounter: Payer: Self-pay | Admitting: Nurse Practitioner

## 2020-08-17 DIAGNOSIS — M353 Polymyalgia rheumatica: Secondary | ICD-10-CM

## 2020-08-17 MED ORDER — GABAPENTIN 100 MG PO CAPS: 100.0000 mg | ORAL_CAPSULE | Freq: Two times a day (BID) | ORAL | 0 refills | Status: DC

## 2020-09-08 ENCOUNTER — Ambulatory Visit: Payer: Medicare Other | Admitting: Internal Medicine

## 2020-09-28 DIAGNOSIS — G8929 Other chronic pain: Secondary | ICD-10-CM | POA: Diagnosis not present

## 2020-09-28 DIAGNOSIS — M25552 Pain in left hip: Secondary | ICD-10-CM | POA: Diagnosis not present

## 2020-10-14 ENCOUNTER — Other Ambulatory Visit: Payer: Self-pay

## 2020-10-14 DIAGNOSIS — M353 Polymyalgia rheumatica: Secondary | ICD-10-CM

## 2020-10-14 MED ORDER — GABAPENTIN 100 MG PO CAPS: 100.0000 mg | ORAL_CAPSULE | Freq: Two times a day (BID) | ORAL | 0 refills | Status: DC

## 2020-11-16 ENCOUNTER — Other Ambulatory Visit: Payer: Self-pay

## 2020-11-16 ENCOUNTER — Encounter: Payer: Self-pay | Admitting: Hospice and Palliative Medicine

## 2020-11-16 ENCOUNTER — Ambulatory Visit (INDEPENDENT_AMBULATORY_CARE_PROVIDER_SITE_OTHER): Payer: Medicare Other | Admitting: Hospice and Palliative Medicine

## 2020-11-16 VITALS — BP 119/80 | HR 96 | Temp 98.0°F | Ht 64.0 in | Wt 160.0 lb

## 2020-11-16 DIAGNOSIS — J4520 Mild intermittent asthma, uncomplicated: Secondary | ICD-10-CM | POA: Diagnosis not present

## 2020-11-16 DIAGNOSIS — G4734 Idiopathic sleep related nonobstructive alveolar hypoventilation: Secondary | ICD-10-CM

## 2020-11-16 NOTE — Progress Notes (Signed)
Stacy [name], Stacy [name]  Internal MEDICINE  Telephone Visit  Patient Name: Stacy [name]  [id number]  [id number]  Date of Service: 11/16/2020  I connected with the patient at 1159 by telephone and verified the patients identity using two identifiers.   I discussed the limitations, risks, security and privacy concerns of performing an evaluation and management service by telephone and the availability of in person appointments. I also discussed with the patient that there may be a patient responsible charge related to the service.  The patient expressed understanding and agrees to proceed.    Chief Complaint  Patient presents with  . Telephone Assessment    [phone]  . Telephone Screen  . Asthma    HPI Patient being seen virtually today for routine pulmonary follow-up History of asthma--breathing has been well controlled No longer wearing her oxygen during day, will occasionally wear oxygen at night Does not have access to a pulse oximeter at her home No longer taking Singulair or Flonase--not currently using any inhalers Last PFT 2019--normal findings No recent exacerbations or hospitalizations   Current Medication: Outpatient Encounter Medications as of 11/16/2020  Medication Sig Note  . acetaminophen (TYLENOL) 325 MG tablet Take 650 mg by mouth every 6 (six) hours as needed for pain.   . benzonatate (TESSALON) 200 MG capsule Take 200 mg by mouth 2 (two) times daily as needed for cough.   . Flaxseed, Linseed, (FLAX SEED OIL PO) Take 1 tablet by mouth daily.    . fluticasone (FLONASE) 50 MCG/ACT nasal spray Place 2 sprays into both nostrils daily.  12/25/2014: Received from: External Pharmacy  . gabapentin (NEURONTIN) 100 MG capsule Take 1 capsule (100 mg total) by mouth 2 (two) times daily.   Marland Kitchen glucose blood (ACCU-CHEK AVIVA PLUS) test strip USE TO CHECK GLUCOSE ONCE DAILY dx e11.65   . hydroxychloroquine (PLAQUENIL) 200 MG tablet Take 1  tablet by mouth daily.   Marland Kitchen lisinopril (PRINIVIL,ZESTRIL) 5 MG tablet Take 1 tablet (5 mg total) by mouth daily.   . OXYGEN Inhale into the lungs as needed.   . predniSONE (DELTASONE) 10 MG tablet Take 1/2 to 1 tablet po QD   . traMADol (ULTRAM) 50 MG tablet Take 1 tablet po TID prn pain.   . montelukast (SINGULAIR) 10 MG tablet Take 1 tablet (10 mg total) by mouth at bedtime.    No facility-administered encounter medications on file as of 11/16/2020.    Surgical History: Past Surgical History:  Procedure Laterality Date  . BREAST BIOPSY Left 2006   +  . BREAST SURGERY Left 2006   lumpectomy  . CHOLECYSTECTOMY  1969  . COLON SURGERY  2004  . COLONOSCOPY  2014   Dr. Candace Cruise  . EYE SURGERY  2002  . FOOT SURGERY  1996  . HERNIA REPAIR  2013  . left breast biopsy   2011    Medical History: Past Medical History:  Diagnosis Date  . Arthritis 1940  . Asthma   . Bowel trouble   . Cancer [facility]) 2006   DCIS left breast  . Hypertension 1940  . Incisional hernia 2014  . Personal history of radiation therapy   . Polymyalgia ([place])   . Thyroid disorder   . Vitiligo 2012    Family History: Family History  Problem Relation Age of Onset  . Heart disease Mother   . Emphysema Mother   . Arthritis Father   . Cancer Father   . Breast  cancer Neg Hx     Social History   Socioeconomic History  . Marital status: Married    Spouse name: Not on file  . Number of children: Not on file  . Years of education: Not on file  . Highest education level: Not on file  Occupational History  . Not on file  Tobacco Use  . Smoking status: Never Smoker  . Smokeless tobacco: Never Used  Vaping Use  . Vaping Use: Never used  Substance and Sexual Activity  . Alcohol use: No  . Drug use: No  . Sexual activity: Not on file  Other Topics Concern  . Not on file  Social History Narrative  . Not on file   Social Determinants of Health   Financial Resource Strain: Not on file  Food Insecurity: Not  on file  Transportation Needs: Not on file  Physical Activity: Not on file  Stress: Not on file  Social Connections: Not on file  Intimate Partner Violence: Not on file    Review of Systems  Constitutional: Negative for chills, diaphoresis and fatigue.  HENT: Negative for ear pain, postnasal drip and sinus pressure.   Eyes: Negative for photophobia, discharge, redness, itching and visual disturbance.  Respiratory: Negative for cough, shortness of breath and wheezing.   Cardiovascular: Negative for chest pain, palpitations and leg swelling.  Gastrointestinal: Negative for abdominal pain, constipation, diarrhea, nausea and vomiting.  Genitourinary: Negative for dysuria and flank pain.  Musculoskeletal: Negative for arthralgias, back pain, gait problem and neck pain.  Skin: Negative for color change.  Allergic/Immunologic: Negative for environmental allergies and food allergies.  Neurological: Negative for dizziness and headaches.  Hematological: Does not bruise/bleed easily.  Psychiatric/Behavioral: Negative for agitation, behavioral problems (depression) and hallucinations.    Vital Signs: BP 119/80   Pulse 96   Temp 98 F (36.7 C)   Ht 5\' 4"  (1.626 m)   Wt 160 lb (72.6 kg)   BMI 27.46 kg/m    Observation/Objective: Well appearing, alert and oriented, answers questions appropriately.   Assessment/Plan: 1. Mild intermittent asthma without complication Symptoms remain well controlled, not currently on treatment. Continue to monitor.  2. Nocturnal hypoxemia Encouraged nightly compliance with nocturnal oxygen and to get pulse oximeter for home use to monitor SpO2 levels throughout the day.  General Counseling: zuha dejonge understanding of the findings of today's phone visit and agrees with plan of treatment. I have discussed any further diagnostic evaluation that may be needed or ordered today. We also reviewed her medications today. she has been encouraged to call the  office with any questions or concerns that should arise related to todays visit.   Time spent: 25 Minutes Time spent includes review of chart, medications, test results and follow-up plan with the patient.  Tanna Furry Harris AGNP-C Internal medicine

## 2020-11-17 ENCOUNTER — Encounter: Payer: Self-pay | Admitting: Hospice and Palliative Medicine

## 2021-01-05 ENCOUNTER — Other Ambulatory Visit: Payer: Self-pay | Admitting: Internal Medicine

## 2021-01-05 DIAGNOSIS — M353 Polymyalgia rheumatica: Secondary | ICD-10-CM

## 2021-01-26 ENCOUNTER — Telehealth: Payer: Self-pay | Admitting: Internal Medicine

## 2021-01-26 NOTE — Progress Notes (Signed)
  Chronic Care Management   Note  01/26/2021 Name: CLOEY SFERRAZZA MRN: [id number] DOB: Apr 28, 1927  LEYLANIE WOODMANSEE is a 87 y.o. year old female who is a primary care patient of [name], MD. I reached out to Ames Dura by phone today in response to a referral sent by Ms. Roberto Scales Moyd's PCP, [name], MD.   Ms. Gentry was given information about Chronic Care Management services today including:  1. CCM service includes personalized support from designated clinical staff supervised by her physician, including individualized plan of care and coordination with other care providers 2. 24/7 contact phone numbers for assistance for urgent and routine care needs. 3. Service will only be billed when office clinical staff spend 20 minutes or more in a month to coordinate care. 4. Only one practitioner may furnish and bill the service in a calendar month. 5. The patient may stop CCM services at any time (effective at the end of the month) by phone call to the office staff.   Patient agreed to services and verbal consent obtained.   Follow up plan:   Carley Perdue [employer] Scheduler

## 2021-02-10 ENCOUNTER — Other Ambulatory Visit: Payer: Self-pay | Admitting: Nurse Practitioner

## 2021-02-10 DIAGNOSIS — M353 Polymyalgia rheumatica: Secondary | ICD-10-CM

## 2021-03-07 ENCOUNTER — Encounter: Payer: Self-pay | Admitting: Emergency Medicine

## 2021-03-07 ENCOUNTER — Emergency Department
Admission: EM | Admit: 2021-03-07 | Discharge: 2021-03-07 | Disposition: A | Discharge status: Home / Self Care | Payer: Medicare Other | Source: Home / Self Care | Attending: Emergency Medicine | Admitting: Emergency Medicine

## 2021-03-07 ENCOUNTER — Emergency Department: Payer: Medicare Other

## 2021-03-07 ENCOUNTER — Other Ambulatory Visit: Payer: Self-pay

## 2021-03-07 ENCOUNTER — Inpatient Hospital Stay
Admission: EM | Admit: 2021-03-07 | Discharge: 2021-03-16 | DRG: 812 | Disposition: A | Discharge status: Skilled Nursing Facility | Payer: Medicare Other | Attending: Internal Medicine | Admitting: Internal Medicine

## 2021-03-07 ENCOUNTER — Observation Stay: Payer: Medicare Other

## 2021-03-07 DIAGNOSIS — G8929 Other chronic pain: Secondary | ICD-10-CM | POA: Diagnosis present

## 2021-03-07 DIAGNOSIS — I509 Heart failure, unspecified: Secondary | ICD-10-CM | POA: Diagnosis not present

## 2021-03-07 DIAGNOSIS — Z881 Allergy status to other antibiotic agents status: Secondary | ICD-10-CM

## 2021-03-07 DIAGNOSIS — E876 Hypokalemia: Secondary | ICD-10-CM | POA: Diagnosis not present

## 2021-03-07 DIAGNOSIS — Z880 Allergy status to penicillin: Secondary | ICD-10-CM

## 2021-03-07 DIAGNOSIS — B9620 Unspecified Escherichia coli [E. coli] as the cause of diseases classified elsewhere: Secondary | ICD-10-CM | POA: Diagnosis present

## 2021-03-07 DIAGNOSIS — D72829 Elevated white blood cell count, unspecified: Secondary | ICD-10-CM

## 2021-03-07 DIAGNOSIS — J9611 Chronic respiratory failure with hypoxia: Secondary | ICD-10-CM | POA: Diagnosis present

## 2021-03-07 DIAGNOSIS — J9811 Atelectasis: Secondary | ICD-10-CM | POA: Diagnosis not present

## 2021-03-07 DIAGNOSIS — Z885 Allergy status to narcotic agent status: Secondary | ICD-10-CM

## 2021-03-07 DIAGNOSIS — I10 Essential (primary) hypertension: Secondary | ICD-10-CM | POA: Diagnosis not present

## 2021-03-07 DIAGNOSIS — N939 Abnormal uterine and vaginal bleeding, unspecified: Secondary | ICD-10-CM | POA: Diagnosis not present

## 2021-03-07 DIAGNOSIS — R55 Syncope and collapse: Secondary | ICD-10-CM | POA: Diagnosis not present

## 2021-03-07 DIAGNOSIS — E11649 Type 2 diabetes mellitus with hypoglycemia without coma: Secondary | ICD-10-CM | POA: Diagnosis present

## 2021-03-07 DIAGNOSIS — E871 Hypo-osmolality and hyponatremia: Secondary | ICD-10-CM | POA: Diagnosis not present

## 2021-03-07 DIAGNOSIS — M353 Polymyalgia rheumatica: Secondary | ICD-10-CM | POA: Diagnosis present

## 2021-03-07 DIAGNOSIS — I130 Hypertensive heart and chronic kidney disease with heart failure and stage 1 through stage 4 chronic kidney disease, or unspecified chronic kidney disease: Secondary | ICD-10-CM | POA: Diagnosis not present

## 2021-03-07 DIAGNOSIS — Z515 Encounter for palliative care: Secondary | ICD-10-CM | POA: Diagnosis not present

## 2021-03-07 DIAGNOSIS — Z923 Personal history of irradiation: Secondary | ICD-10-CM

## 2021-03-07 DIAGNOSIS — Z6831 Body mass index (BMI) 31.0-31.9, adult: Secondary | ICD-10-CM

## 2021-03-07 DIAGNOSIS — K439 Ventral hernia without obstruction or gangrene: Secondary | ICD-10-CM | POA: Diagnosis not present

## 2021-03-07 DIAGNOSIS — K5900 Constipation, unspecified: Secondary | ICD-10-CM | POA: Diagnosis not present

## 2021-03-07 DIAGNOSIS — E119 Type 2 diabetes mellitus without complications: Secondary | ICD-10-CM

## 2021-03-07 DIAGNOSIS — R0902 Hypoxemia: Secondary | ICD-10-CM | POA: Diagnosis not present

## 2021-03-07 DIAGNOSIS — J9601 Acute respiratory failure with hypoxia: Secondary | ICD-10-CM | POA: Diagnosis not present

## 2021-03-07 DIAGNOSIS — M25552 Pain in left hip: Secondary | ICD-10-CM | POA: Diagnosis present

## 2021-03-07 DIAGNOSIS — E440 Moderate protein-calorie malnutrition: Secondary | ICD-10-CM | POA: Diagnosis not present

## 2021-03-07 DIAGNOSIS — R627 Adult failure to thrive: Secondary | ICD-10-CM | POA: Diagnosis present

## 2021-03-07 DIAGNOSIS — Z853 Personal history of malignant neoplasm of breast: Secondary | ICD-10-CM | POA: Insufficient documentation

## 2021-03-07 DIAGNOSIS — J45909 Unspecified asthma, uncomplicated: Secondary | ICD-10-CM | POA: Diagnosis not present

## 2021-03-07 DIAGNOSIS — R58 Hemorrhage, not elsewhere classified: Secondary | ICD-10-CM | POA: Diagnosis not present

## 2021-03-07 DIAGNOSIS — D62 Acute posthemorrhagic anemia: Secondary | ICD-10-CM | POA: Diagnosis not present

## 2021-03-07 DIAGNOSIS — Z7189 Other specified counseling: Secondary | ICD-10-CM | POA: Diagnosis not present

## 2021-03-07 DIAGNOSIS — K529 Noninfective gastroenteritis and colitis, unspecified: Secondary | ICD-10-CM

## 2021-03-07 DIAGNOSIS — I471 Supraventricular tachycardia: Secondary | ICD-10-CM | POA: Diagnosis present

## 2021-03-07 DIAGNOSIS — Z91048 Other nonmedicinal substance allergy status: Secondary | ICD-10-CM

## 2021-03-07 DIAGNOSIS — N390 Urinary tract infection, site not specified: Secondary | ICD-10-CM | POA: Diagnosis present

## 2021-03-07 DIAGNOSIS — M1612 Unilateral primary osteoarthritis, left hip: Secondary | ICD-10-CM | POA: Diagnosis not present

## 2021-03-07 DIAGNOSIS — K219 Gastro-esophageal reflux disease without esophagitis: Secondary | ICD-10-CM | POA: Diagnosis not present

## 2021-03-07 DIAGNOSIS — Z20822 Contact with and (suspected) exposure to covid-19: Secondary | ICD-10-CM | POA: Diagnosis present

## 2021-03-07 DIAGNOSIS — E1140 Type 2 diabetes mellitus with diabetic neuropathy, unspecified: Secondary | ICD-10-CM | POA: Diagnosis not present

## 2021-03-07 DIAGNOSIS — K559 Vascular disorder of intestine, unspecified: Secondary | ICD-10-CM | POA: Diagnosis present

## 2021-03-07 DIAGNOSIS — Z79899 Other long term (current) drug therapy: Secondary | ICD-10-CM | POA: Insufficient documentation

## 2021-03-07 DIAGNOSIS — J449 Chronic obstructive pulmonary disease, unspecified: Secondary | ICD-10-CM | POA: Diagnosis present

## 2021-03-07 DIAGNOSIS — R42 Dizziness and giddiness: Secondary | ICD-10-CM

## 2021-03-07 DIAGNOSIS — D649 Anemia, unspecified: Secondary | ICD-10-CM

## 2021-03-07 DIAGNOSIS — H9190 Unspecified hearing loss, unspecified ear: Secondary | ICD-10-CM | POA: Diagnosis not present

## 2021-03-07 DIAGNOSIS — R1311 Dysphagia, oral phase: Secondary | ICD-10-CM | POA: Diagnosis not present

## 2021-03-07 DIAGNOSIS — N179 Acute kidney failure, unspecified: Secondary | ICD-10-CM | POA: Diagnosis present

## 2021-03-07 DIAGNOSIS — N3289 Other specified disorders of bladder: Secondary | ICD-10-CM | POA: Diagnosis not present

## 2021-03-07 DIAGNOSIS — Z86000 Personal history of in-situ neoplasm of breast: Secondary | ICD-10-CM

## 2021-03-07 DIAGNOSIS — M5092 Unspecified cervical disc disorder, mid-cervical region, unspecified level: Secondary | ICD-10-CM | POA: Diagnosis not present

## 2021-03-07 DIAGNOSIS — E1122 Type 2 diabetes mellitus with diabetic chronic kidney disease: Secondary | ICD-10-CM | POA: Diagnosis not present

## 2021-03-07 DIAGNOSIS — I959 Hypotension, unspecified: Secondary | ICD-10-CM | POA: Diagnosis not present

## 2021-03-07 DIAGNOSIS — E872 Acidosis: Secondary | ICD-10-CM | POA: Diagnosis present

## 2021-03-07 DIAGNOSIS — Z7951 Long term (current) use of inhaled steroids: Secondary | ICD-10-CM | POA: Insufficient documentation

## 2021-03-07 DIAGNOSIS — R0689 Other abnormalities of breathing: Secondary | ICD-10-CM | POA: Diagnosis not present

## 2021-03-07 DIAGNOSIS — Z886 Allergy status to analgesic agent status: Secondary | ICD-10-CM | POA: Diagnosis not present

## 2021-03-07 DIAGNOSIS — R0602 Shortness of breath: Secondary | ICD-10-CM | POA: Diagnosis not present

## 2021-03-07 DIAGNOSIS — E039 Hypothyroidism, unspecified: Secondary | ICD-10-CM | POA: Diagnosis not present

## 2021-03-07 DIAGNOSIS — R531 Weakness: Secondary | ICD-10-CM | POA: Diagnosis not present

## 2021-03-07 DIAGNOSIS — N189 Chronic kidney disease, unspecified: Secondary | ICD-10-CM | POA: Diagnosis not present

## 2021-03-07 DIAGNOSIS — K9189 Other postprocedural complications and disorders of digestive system: Secondary | ICD-10-CM | POA: Diagnosis not present

## 2021-03-07 DIAGNOSIS — R404 Transient alteration of awareness: Secondary | ICD-10-CM | POA: Diagnosis not present

## 2021-03-07 DIAGNOSIS — Z7952 Long term (current) use of systemic steroids: Secondary | ICD-10-CM | POA: Diagnosis not present

## 2021-03-07 DIAGNOSIS — R5381 Other malaise: Secondary | ICD-10-CM | POA: Diagnosis not present

## 2021-03-07 HISTORY — DX: Noninfective gastroenteritis and colitis, unspecified: K52.9

## 2021-03-07 LAB — CBC WITH DIFFERENTIAL/PLATELET
Abs Immature Granulocytes: 0.05 10*3/uL (ref 0.00–0.07)
Abs Immature Granulocytes: 0.3 10*3/uL — ABNORMAL HIGH (ref 0.00–0.07)
Basophils Absolute: 0.1 10*3/uL (ref 0.0–0.1)
Basophils Absolute: 0.1 10*3/uL (ref 0.0–0.1)
Basophils Relative: 1 %
Basophils Relative: 1 %
Eosinophils Absolute: 0.5 10*3/uL (ref 0.0–0.5)
Eosinophils Absolute: 0.6 10*3/uL — ABNORMAL HIGH (ref 0.0–0.5)
Eosinophils Relative: 6 %
Eosinophils Relative: 4 %
HCT: 29.6 % — ABNORMAL LOW (ref 36.0–46.0)
HCT: 28.2 % — ABNORMAL LOW (ref 36.0–46.0)
Hemoglobin: 9.4 g/dL — ABNORMAL LOW (ref 12.0–15.0)
Hemoglobin: 8.5 g/dL — ABNORMAL LOW (ref 12.0–15.0)
Immature Granulocytes: 1 %
Immature Granulocytes: 2 %
Lymphocytes Relative: 40 %
Lymphocytes Relative: 41 %
Lymphs Abs: 3.8 10*3/uL (ref 0.7–4.0)
Lymphs Abs: 7 10*3/uL — ABNORMAL HIGH (ref 0.7–4.0)
MCH: 27.4 pg (ref 26.0–34.0)
MCH: 27.5 pg (ref 26.0–34.0)
MCHC: 31.8 g/dL (ref 30.0–36.0)
MCHC: 30.1 g/dL (ref 30.0–36.0)
MCV: 86.3 fL (ref 80.0–100.0)
MCV: 91.3 fL (ref 80.0–100.0)
Monocytes Absolute: 0.8 10*3/uL (ref 0.1–1.0)
Monocytes Absolute: 1.6 10*3/uL — ABNORMAL HIGH (ref 0.1–1.0)
Monocytes Relative: 8 %
Monocytes Relative: 9 %
Neutro Abs: 4.4 10*3/uL (ref 1.7–7.7)
Neutro Abs: 7.5 10*3/uL (ref 1.7–7.7)
Neutrophils Relative %: 44 %
Neutrophils Relative %: 43 %
Platelets: 225 10*3/uL (ref 150–400)
Platelets: 238 10*3/uL (ref 150–400)
RBC: 3.43 MIL/uL — ABNORMAL LOW (ref 3.87–5.11)
RBC: 3.09 MIL/uL — ABNORMAL LOW (ref 3.87–5.11)
RDW: 15.5 % (ref 11.5–15.5)
RDW: 15.6 % — ABNORMAL HIGH (ref 11.5–15.5)
WBC: 9.7 10*3/uL (ref 4.0–10.5)
WBC: 17.3 10*3/uL — ABNORMAL HIGH (ref 4.0–10.5)
nRBC: 0 % (ref 0.0–0.2)
nRBC: 0 % (ref 0.0–0.2)

## 2021-03-07 LAB — RESP PANEL BY RT-PCR (FLU A&B, COVID) ARPGX2
Influenza A by PCR: NEGATIVE
Influenza B by PCR: NEGATIVE
SARS Coronavirus 2 by RT PCR: NEGATIVE

## 2021-03-07 LAB — COMPREHENSIVE METABOLIC PANEL
ALT: 7 U/L (ref 0–44)
AST: 14 U/L — ABNORMAL LOW (ref 15–41)
Albumin: 3.2 g/dL — ABNORMAL LOW (ref 3.5–5.0)
Alkaline Phosphatase: 55 U/L (ref 38–126)
Anion gap: 6 (ref 5–15)
BUN: 20 mg/dL (ref 8–23)
CO2: 27 mmol/L (ref 22–32)
Calcium: 8.5 mg/dL — ABNORMAL LOW (ref 8.9–10.3)
Chloride: 103 mmol/L (ref 98–111)
Creatinine, Ser: 1.36 mg/dL — ABNORMAL HIGH (ref 0.44–1.00)
GFR, Estimated: 37 mL/min — ABNORMAL LOW (ref >60)
Glucose, Bld: 112 mg/dL — ABNORMAL HIGH (ref 70–99)
Potassium: 3.7 mmol/L (ref 3.5–5.1)
Sodium: 136 mmol/L (ref 135–145)
Total Bilirubin: 0.4 mg/dL (ref 0.3–1.2)
Total Protein: 6.3 g/dL — ABNORMAL LOW (ref 6.5–8.1)

## 2021-03-07 LAB — BASIC METABOLIC PANEL
Anion gap: 17 — ABNORMAL HIGH (ref 5–15)
BUN: 21 mg/dL (ref 8–23)
CO2: 14 mmol/L — ABNORMAL LOW (ref 22–32)
Calcium: 8.6 mg/dL — ABNORMAL LOW (ref 8.9–10.3)
Chloride: 105 mmol/L (ref 98–111)
Creatinine, Ser: 1.69 mg/dL — ABNORMAL HIGH (ref 0.44–1.00)
GFR, Estimated: 28 mL/min — ABNORMAL LOW (ref >60)
Glucose, Bld: 215 mg/dL — ABNORMAL HIGH (ref 70–99)
Potassium: 4 mmol/L (ref 3.5–5.1)
Sodium: 136 mmol/L (ref 135–145)

## 2021-03-07 LAB — PREPARE RBC (CROSSMATCH)

## 2021-03-07 MED ORDER — INSULIN ASPART 100 UNIT/ML IJ SOLN
0.0000 [IU] | Freq: Three times a day (TID) | INTRAMUSCULAR | Status: DC
  Administered 2021-03-11 – 2021-03-12 (×2): 1 [IU] via SUBCUTANEOUS
  Administered 2021-03-14 – 2021-03-15 (×2): 2 [IU] via SUBCUTANEOUS
  Administered 2021-03-16: 13:00:00 1 [IU] via SUBCUTANEOUS
  Filled 2021-03-07 (×5): qty 1

## 2021-03-07 MED ORDER — HYDROCODONE-ACETAMINOPHEN 5-325 MG PO TABS
1.0000 | ORAL_TABLET | ORAL | Status: DC | PRN
  Administered 2021-03-08: 2 via ORAL
  Administered 2021-03-10 – 2021-03-13 (×4): 1 via ORAL
  Filled 2021-03-07: qty 1
  Filled 2021-03-07: qty 2
  Filled 2021-03-07 (×3): qty 1

## 2021-03-07 MED ORDER — ONDANSETRON HCL 4 MG PO TABS
4.0000 mg | ORAL_TABLET | Freq: Four times a day (QID) | ORAL | Status: DC | PRN
  Filled 2021-03-07: qty 1

## 2021-03-07 MED ORDER — SODIUM CHLORIDE 0.9 % IV SOLN: INTRAVENOUS | Status: DC

## 2021-03-07 MED ORDER — IOHEXOL 300 MG/ML  SOLN
100.0000 mL | Freq: Once | INTRAMUSCULAR | Status: AC | PRN
  Administered 2021-03-07: 100 mL via INTRAVENOUS

## 2021-03-07 MED ORDER — METOCLOPRAMIDE HCL 5 MG/ML IJ SOLN
5.0000 mg | Freq: Once | INTRAMUSCULAR | Status: AC
  Administered 2021-03-07: 5 mg via INTRAVENOUS

## 2021-03-07 MED ORDER — ONDANSETRON HCL 4 MG/2ML IJ SOLN
4.0000 mg | Freq: Four times a day (QID) | INTRAMUSCULAR | Status: DC | PRN
  Administered 2021-03-09 – 2021-03-11 (×4): 4 mg via INTRAVENOUS
  Filled 2021-03-07 (×4): qty 2

## 2021-03-07 MED ORDER — SODIUM CHLORIDE 0.9 % IV SOLN
80.0000 mg | Freq: Once | INTRAVENOUS | Status: DC
  Filled 2021-03-07: qty 80

## 2021-03-07 MED ORDER — SODIUM CHLORIDE 0.9% FLUSH
3.0000 mL | Freq: Two times a day (BID) | INTRAVENOUS | Status: DC
  Administered 2021-03-07 – 2021-03-16 (×14): 3 mL via INTRAVENOUS

## 2021-03-07 MED ORDER — SENNOSIDES-DOCUSATE SODIUM 8.6-50 MG PO TABS: 1.0000 | ORAL_TABLET | Freq: Every evening | ORAL | Status: DC | PRN

## 2021-03-07 MED ORDER — ACETAMINOPHEN 325 MG PO TABS
650.0000 mg | ORAL_TABLET | Freq: Four times a day (QID) | ORAL | Status: DC | PRN
  Administered 2021-03-14: 650 mg via ORAL
  Filled 2021-03-07: qty 2

## 2021-03-07 MED ORDER — ACETAMINOPHEN 650 MG RE SUPP: 650.0000 mg | Freq: Four times a day (QID) | RECTAL | Status: DC | PRN

## 2021-03-07 MED ORDER — INSULIN ASPART 100 UNIT/ML IJ SOLN
0.0000 [IU] | Freq: Every day | INTRAMUSCULAR | Status: DC
  Administered 2021-03-14: 2 [IU] via SUBCUTANEOUS
  Administered 2021-03-15: 21:00:00 3 [IU] via SUBCUTANEOUS
  Filled 2021-03-07 (×2): qty 1

## 2021-03-07 NOTE — ED Provider Notes (Signed)
[facility] Emergency Department Provider Note   ____________________________________________   I have reviewed the triage vital signs and the nursing notes.   HISTORY  Chief Complaint Vaginal bleeding.    History limited by and level 5 caveat due to: Decreased responsiveness.    HPI Stacy [name] is a 87 y.o. female who presents to the emergency department today because of concern for vaginal bleeding. Patient was seen in the emergency department earlier today for concern for vaginal bleeding. [place] was done at that time without any concerning findings. After the ER visit the patient was at home and had a large amount of vaginal bleeding. Per EMS patient was also found to have decreased responsiveness upon their arrival.   Records reviewed. Per medical record review patient has a history of [place] earlier today with no concerning findings.   Past Medical History:  Diagnosis Date   Arthritis 1940   Asthma    Bowel trouble    Cancer ([place]) 2006   DCIS left breast   Hypertension 1940   Incisional hernia 2014   Personal history of radiation therapy    Polymyalgia ([place])    Thyroid disorder    Vitiligo 2012    Patient Active Problem List   Diagnosis Date Noted   Encounter for general adult medical examination with abnormal findings 07/05/2018   Uncontrolled type 2 diabetes mellitus with hyperglycemia ([place]) 07/05/2018   Neoplasm of uncertain behavior of skin of hand 07/05/2018   Simple chronic bronchitis ([place]) 12/31/2017   Type 2 diabetes mellitus with hyperglycemia ([place]) 12/31/2017   Congestive heart failure ([place]) 10/20/2017   Primary generalized (osteo)arthritis 09/21/2017   Vitamin B12 deficiency anemia, unspecified 09/21/2017   Chronic kidney disease, unspecified 09/21/2017   Vitamin D deficiency, unspecified 09/21/2017   Chronic obstructive pulmonary disease with acute lower respiratory infection ([place]) 09/21/2017   Unspecified hearing loss, bilateral  09/21/2017   Age-related osteoporosis without current pathological fracture 09/21/2017   Other amnesia 09/21/2017   Cough 09/21/2017   Allergic rhinitis, unspecified 09/21/2017   Type 2 diabetes mellitus without complication, without long-term current use of insulin ([place]) 09/21/2017   Other seborrheic dermatitis 09/21/2017   Unspecified cervical disc disorder, mid-cervical region, unspecified level 09/21/2017   Neuromuscular dysfunction of bladder, unspecified 09/21/2017   Allergic rhinitis due to pollen 09/21/2017   Other primary ovarian failure 09/21/2017   Shortness of breath 09/21/2017   Acute bronchitis, unspecified 09/21/2017   Benign and innocent cardiac murmurs 09/21/2017   Dysuria 09/21/2017   Retention of urine, unspecified 09/21/2017   Unspecified urinary incontinence 09/21/2017   Mixed incontinence 09/21/2017   Insomnia, unspecified 09/21/2017   Dizziness and giddiness 09/21/2017   Polymyalgia rheumatica ([place]) 09/21/2017   Low back pain 09/21/2017   Hypoxemia 09/21/2017   Spondylosis without myelopathy or radiculopathy, lumbosacral region 09/21/2017   Pain in unspecified knee 09/21/2017   Mixed hyperlipidemia 09/21/2017   Cardiomegaly 09/21/2017   Abnormality of gait and mobility 09/21/2017   Personal history of malignant neoplasm of breast 04/15/2013   History of breast cancer 12/24/2012   Incisional hernia, without obstruction or gangrene 12/24/2012   Essential hypertension    Vitiligo    Arthritis    Thyroid disorder    Bowel trouble     Past Surgical History:  Procedure Laterality Date   BREAST BIOPSY Left 2006   +   BREAST SURGERY Left 2006   lumpectomy   [place]   COLON SURGERY  2004   COLONOSCOPY  2014   Dr. Candace Cruise   EYE SURGERY  2002   FOOT SURGERY  1996   HERNIA REPAIR  2013   left breast biopsy   2011    Prior to Admission medications   Medication Sig Start Date End Date Taking? Authorizing Provider  acetaminophen (TYLENOL) 325  MG tablet Take 650 mg by mouth every 6 (six) hours as needed for pain.    [provider]  Flaxseed, Linseed, (FLAX SEED OIL PO) Take 1 tablet by mouth daily.     [provider]  fluticasone (FLONASE) 50 MCG/ACT nasal spray Place 2 sprays into both nostrils daily.  10/09/14   [provider]  gabapentin (NEURONTIN) 100 MG capsule TAKE 1 CAPSULE TWICE DAILY 01/05/21   [name], MD  glucose blood (ACCU-CHEK AVIVA PLUS) test strip USE TO CHECK GLUCOSE ONCE DAILY dx e11.65 01/31/20   Ronnell Freshwater, NP  hydroxychloroquine (PLAQUENIL) 200 MG tablet Take 1 tablet by mouth daily. 07/26/18   [provider]  lisinopril (PRINIVIL,ZESTRIL) 5 MG tablet Take 1 tablet (5 mg total) by mouth daily. 10/05/18   Ronnell Freshwater, NP  OXYGEN Inhale into the lungs as needed.    [provider]  predniSONE (DELTASONE) 10 MG tablet Take 1/2 to 1 tablet po QD 02/28/20   Ronnell Freshwater, NP  traMADol (ULTRAM) 50 MG tablet Take 1 tablet po TID prn pain. 06/09/20   Ronnell Freshwater, NP    Allergies Aspirin, Augmentin [amoxicillin-pot clavulanate], Codeine, Doxycycline hyclate, Sulfamethoxazole-trimethoprim, Tape, Tramadol, Vitamin d analogs, and Azithromycin  Family History  Problem Relation Age of Onset   Heart disease Mother    Emphysema Mother    Arthritis Father    Cancer Father    Breast cancer Neg Hx     Social History Social History   Tobacco Use   Smoking status: Never   Smokeless tobacco: Never  Vaping Use   Vaping Use: Never used  Substance Use Topics   Alcohol use: No   Drug use: No    Review of Systems Unable to obtain given AMS.   ____________________________________________   PHYSICAL EXAM:  VITAL SIGNS: ED Triage Vitals [03/07/21 1821]  Enc Vitals Group     BP 105/68     Pulse Rate 95     Resp (!) 24     Temp      Temp src      SpO2 99 %     Weight      Height      Head Circumference      Peak Flow      Pain Score       Pain Loc      Pain Edu?      Excl. in [place]?      Constitutional: Somnolent, awakens to verbal stimuli. Eyes: Conjunctivae are normal.  ENT      Head: Normocephalic and atraumatic.      Nose: No congestion/rhinnorhea.      Mouth/Throat: Mucous membranes are moist.      Neck: No stridor. Hematological/Lymphatic/Immunilogical: No cervical lymphadenopathy. Cardiovascular: Normal rate, regular rhythm.  No murmurs, rubs, or gallops.  Respiratory: Normal respiratory effort without tachypnea nor retractions. Breath sounds are clear and equal bilaterally. No wheezes/rales/rhonchi. Gastrointestinal: Soft and non tender. No rebound. No guarding.  Genitourinary: Deferred Musculoskeletal: Normal range of motion in all extremities. No lower extremity edema. Neurologic:  Somnolent. Moving all extremities. Skin:  Skin is warm, dry and intact. No  rash noted. Psychiatric: Mood and affect are normal. Speech and behavior are normal. Patient exhibits appropriate insight and judgment.  ____________________________________________    LABS (pertinent positives/negatives)  CBC wbc 17.3, hgb 8.5, plt 238 BMP na 136, k 4.0, glu 215, cr 1.69 ____________________________________________   EKG  I, Nance Pear, attending physician, personally viewed and interpreted this EKG  EKG Time: 1816 Rate: 97 Rhythm: sinus rhythm Axis: normal Intervals: qtc 531 QRS: narrow, q waves v1 ST changes: no st elevation Impression: abnormal ekg  ____________________________________________    RADIOLOGY  CT abd.pel Large fecal ball in rectum. Some cecum wall thickening.   ____________________________________________   PROCEDURES  Procedures  CRITICAL CARE Performed by: Nance Pear   Total critical care time: 35 minutes  Critical care time was exclusive of separately billable procedures and treating other patients.  Critical care was necessary to treat or prevent imminent or life-threatening  deterioration.  Critical care was time spent personally by me on the following activities: development of treatment plan with patient and/or surrogate as well as nursing, discussions with consultants, evaluation of patient's response to treatment, examination of patient, obtaining history from patient or surrogate, ordering and performing treatments and interventions, ordering and review of laboratory studies, ordering and review of radiographic studies, pulse oximetry and re-evaluation of patient's condition.  ____________________________________________   INITIAL IMPRESSION / ASSESSMENT AND PLAN / ED COURSE  Pertinent labs & imaging results that were available during my care of the patient were reviewed by me and considered in my medical decision making (see chart for details).   Patient presented to the emergency department today because of concerns for further vaginal bleeding.  Had been seen earlier in the ED.  On initial exam patient was altered and not as responsive as baseline.  Patient was pale.  Slightly tachycardic.  Given concern for significant bleed 1 unit of packed red blood cells was emergently given.  Patient did seem to respond well to this.  Start becoming more alert and awake.  Discussed with Dr. Kenton Kingfisher with OB/GYN.  At this time unclear advantage of any specific gynecological intervention.  CT scan was obtained which did not elucidate the etiology of the bleeding.  Patient will be admitted to the hospital service for further work-up and evaluation.  ____________________________________________   FINAL CLINICAL IMPRESSION(S) / ED DIAGNOSES  Final diagnoses:  Vaginal bleeding  Anemia, unspecified type     Note: This dictation was prepared with Dragon dictation. Any transcriptional errors that result from this process are unintentional     Nance Pear, MD 03/07/21 2242

## 2021-03-07 NOTE — ED Triage Notes (Signed)
BIB EMS of vaginal bleeding x 3 days. Pt advised she is bleeding from rectum and vagina.  EMS found with O2 sat 70% on RA. EMS placed on NRB. Pt does have home O2 but does not use it.   80s HR 130/76 163 BGL 97.9 T

## 2021-03-07 NOTE — H&P (Signed)
History and Physical    Stacy [name] DUK:[id number] DOB: [birth year] DOA: 03/07/2021  PCP: [name], MD   Patient coming from: home  I have personally briefly reviewed patient's old medical records in [place]  Chief Complaint: bleeding weakness  HPI: Stacy [name] is a 87 y.o. female with medical history significant for Diabetes, hypertension, hypothyroidism, COPD, chronic respiratory failure home O2 which she uses as needed, polymyalgia rheumatica on chronic low-dose prednisone, who presents by EMS after she was found ' semiresponsive on the floor beside the toilet' with a large amount of blood in the toilet.  O2 sats were in the 70s on room air on arrival of EMS, requiring NRB and she was reportedly pale and diaphoretic.  Patient was seen several hours earlier in the emergency room with a 3-day history of vaginal bleeding confirmed with pelvic exam.  Vaginal ultrasound showed no abnormality.  Patient was discharged only to return in several hours later by EMS as above.  By arrival in the ED she was alert. Patient states she had been having intermittent bleeding but on the day of arrival the bleeding became heavier and was constant for about 15 minutes.  States she did not fall but is herself off the commode onto the floor when she started feeling dizzy. She denied abdominal pain, fever or chills, chest pain or shortness of breath or cough.  Denied diarrhea or constipation.   ED course: On arrival, afebrile, BP 105/88 with pulse of 95, tachypneic at 24 with O2 sat 100% on NRB.  Blood work revealed hemoglobin of 8.5, down from 9.4 eight hours earlier, last baseline for comparison was around 10.4 from a year ago.  WBC 17,000, up from 9700 earlier in the day.  Creatinine 1.69.  Rectal exam revealed brown stool that was guaiac negative EKG, personally reviewed and interpreted: Sinus rhythm at 97 with nonspecific ST-T wave changes Imaging: CT abdomen: Large stool ball in rectum with adjacent fat  stranding... Findings in colon consistent with colitis which could be inflammatory infectious or ischemic Pelvic ultrasound: Unremarkable.    Patient received a unit of emergency release blood the ER.  ED provider spoke with GYN, Dr. Kenton Kingfisher who requested medical admission.   Review of Systems: As per HPI otherwise all other systems on review of systems negative.    Past Medical History:  Diagnosis Date   Arthritis 1940   Asthma    Bowel trouble    Cancer ([place]) 2006   DCIS left breast   Hypertension 1940   Incisional hernia 2014   Personal history of radiation therapy    Polymyalgia ([place])    Thyroid disorder    Vitiligo 2012    Past Surgical History:  Procedure Laterality Date   BREAST BIOPSY Left 2006   +   BREAST SURGERY Left 2006   lumpectomy   [place]   COLON SURGERY  2004   COLONOSCOPY  2014   Dr. Candace Cruise   EYE SURGERY  2002   FOOT SURGERY  1996   HERNIA REPAIR  2013   left breast biopsy   2011     reports that she has never smoked. She has never used smokeless tobacco. She reports that she does not drink alcohol and does not use drugs.  Allergies  Allergen Reactions   Aspirin Itching and Other (See Comments)    Dizzy/passing out   Augmentin [Amoxicillin-Pot Clavulanate]    Codeine Other (See Comments)    dizziness  Doxycycline Hyclate Other (See Comments)   Sulfamethoxazole-Trimethoprim Diarrhea   Tape Itching   Tramadol Other (See Comments)    Makes her feel dizzy and feels like passing out Pt states she can now take this medication.  (02/27/18)   Vitamin D Analogs    Azithromycin Rash and Hives    Family History  Problem Relation Age of Onset   Heart disease Mother    Emphysema Mother    Arthritis Father    Cancer Father    Breast cancer Neg Hx       Prior to Admission medications   Medication Sig Start Date End Date Taking? Authorizing Provider  acetaminophen (TYLENOL) 325 MG tablet Take 650 mg by mouth every 6 (six) hours as needed  for pain.    [provider]  Flaxseed, Linseed, (FLAX SEED OIL PO) Take 1 tablet by mouth daily.     [provider]  fluticasone (FLONASE) 50 MCG/ACT nasal spray Place 2 sprays into both nostrils daily.  10/09/14   [provider]  gabapentin (NEURONTIN) 100 MG capsule TAKE 1 CAPSULE TWICE DAILY 01/05/21   [name], MD  glucose blood (ACCU-CHEK AVIVA PLUS) test strip USE TO CHECK GLUCOSE ONCE DAILY dx e11.65 01/31/20   Ronnell Freshwater, NP  hydroxychloroquine (PLAQUENIL) 200 MG tablet Take 1 tablet by mouth daily. 07/26/18   [provider]  lisinopril (PRINIVIL,ZESTRIL) 5 MG tablet Take 1 tablet (5 mg total) by mouth daily. 10/05/18   Ronnell Freshwater, NP  OXYGEN Inhale into the lungs as needed.    [provider]  predniSONE (DELTASONE) 10 MG tablet Take 1/2 to 1 tablet po QD 02/28/20   Ronnell Freshwater, NP  traMADol (ULTRAM) 50 MG tablet Take 1 tablet po TID prn pain. 06/09/20   Ronnell Freshwater, NP    Physical Exam: Vitals:   03/07/21 1948 03/07/21 2000 03/07/21 2015 03/07/21 2030  BP: (!) 152/86 130/89 131/72 (!) 147/80  Pulse: 97 91 98 98  Resp: 20 20 20 20   SpO2: 100% 100% 100% 100%     Vitals:   03/07/21 1948 03/07/21 2000 03/07/21 2015 03/07/21 2030  BP: (!) 152/86 130/89 131/72 (!) 147/80  Pulse: 97 91 98 98  Resp: 20 20 20 20   SpO2: 100% 100% 100% 100%      Constitutional: Alert and oriented x 3 . Not in any apparent distress HEENT:      Head: Normocephalic and atraumatic.         Eyes: PERLA, EOMI, Conjunctivae are normal. Sclera is non-icteric.       Mouth/Throat: Mucous membranes are moist, pale       Neck: Supple with no signs of meningismus. Cardiovascular: Regular rate and rhythm. No murmurs, gallops, or rubs. 2+ symmetrical distal pulses are present . No JVD. No LE edema Respiratory: Respiratory effort normal .Lungs sounds clear bilaterally. No wheezes, crackles, or rhonchi.  Gastrointestinal: Soft, non tender,  and non distended with positive bowel sounds.  Genitourinary: No CVA tenderness. Musculoskeletal: Nontender with normal range of motion in all extremities. No cyanosis, or erythema of extremities. Neurologic:  Face is symmetric. Moving all extremities. No gross focal neurologic deficits . Skin: Skin is warm, dry.  No rash or ulcers Psychiatric: Mood and affect are normal    Labs on Admission: I have personally reviewed following labs and imaging studies  CBC: Recent Labs  Lab 03/07/21 1113 03/07/21 1824  WBC 9.7 17.3*  NEUTROABS 4.4 7.5  HGB  9.4* 8.5*  HCT 29.6* 28.2*  MCV 86.3 91.3  PLT 225 588   Basic Metabolic Panel: Recent Labs  Lab 03/07/21 1113 03/07/21 1824  NA 136 136  K 3.7 4.0  CL 103 105  CO2 27 14*  GLUCOSE 112* 215*  BUN 20 21  CREATININE 1.36* 1.69*  CALCIUM 8.5* 8.6*   GFR: Estimated Creatinine Clearance: 19.7 mL/min (A) (by C-G formula based on SCr of 1.69 mg/dL (H)). Liver Function Tests: Recent Labs  Lab 03/07/21 1113  AST 14*  ALT 7  ALKPHOS 55  BILITOT 0.4  PROT 6.3*  ALBUMIN 3.2*   No results for input(s): LIPASE, AMYLASE in the last 168 hours. No results for input(s): AMMONIA in the last 168 hours. Coagulation Profile: No results for input(s): INR, PROTIME in the last 168 hours. Cardiac Enzymes: No results for input(s): CKTOTAL, CKMB, CKMBINDEX, TROPONINI in the last 168 hours. BNP (last 3 results) No results for input(s): PROBNP in the last 8760 hours. HbA1C: No results for input(s): HGBA1C in the last 72 hours. CBG: No results for input(s): GLUCAP in the last 168 hours. Lipid Profile: No results for input(s): CHOL, HDL, LDLCALC, TRIG, CHOLHDL, LDLDIRECT in the last 72 hours. Thyroid Function Tests: No results for input(s): TSH, T4TOTAL, FREET4, T3FREE, THYROIDAB in the last 72 hours. Anemia Panel: No results for input(s): VITAMINB12, FOLATE, FERRITIN, TIBC, IRON, RETICCTPCT in the last 72 hours. Urine analysis:    Component  Value Date/Time   COLORURINE Yellow 04/30/2012 1618   APPEARANCEUR Clear 07/05/2018 1446   LABSPEC 1.036 04/30/2012 1618   PHURINE 6.0 04/30/2012 1618   GLUCOSEU Negative 07/05/2018 1446   GLUCOSEU Negative 04/30/2012 1618   HGBUR Negative 04/30/2012 1618   BILIRUBINUR Negative 07/05/2018 1446   BILIRUBINUR Negative 04/30/2012 1618   KETONESUR Trace 04/30/2012 1618   PROTEINUR Trace 07/05/2018 1446   PROTEINUR 30 mg/dL 04/30/2012 1618   NITRITE Negative 07/05/2018 1446   NITRITE Negative 04/30/2012 1618   LEUKOCYTESUR 1+ (A) 07/05/2018 1446   LEUKOCYTESUR 2+ 04/30/2012 1618    Radiological Exams on Admission: US Pelvis Complete  Result Date: 03/07/2021 CLINICAL DATA:  Vaginal bleeding EXAM: TRANSABDOMINAL ULTRASOUND OF PELVIS TECHNIQUE: Transabdominal ultrasound examination of the pelvis was performed including evaluation of the uterus, ovaries, adnexal regions, and pelvic cul-de-sac. COMPARISON:  Ultrasound 12/05/2018 FINDINGS: Uterus Measurements: 6.4 x 1.7 x 3.0 = volume: 17 mL. No fibroids or other mass visualized. Endometrium Thickness: 2.5 mm.  No focal abnormality visualized. Right ovary Measurements: Nonvisualized Left ovary Measurements: Not visualized. Other findings:  No abnormal free fluid. IMPRESSION: Unremarkable sonographic appearance of the uterus. No endometrial thickening or mass visible on transabdominal exam. Nonvisualization of the ovaries. Electronically Signed   By: Maurine Simmering   On: 03/07/2021 12:38   CT ABDOMEN PELVIS W CONTRAST  Result Date: 03/07/2021 CLINICAL DATA:  Vaginal and rectal bleeding. Patient found semi responsive. Blood in toilet. EXAM: CT ABDOMEN AND PELVIS WITH CONTRAST TECHNIQUE: Multidetector CT imaging of the abdomen and pelvis was performed using the standard protocol following bolus administration of intravenous contrast. CONTRAST:  136mL OMNIPAQUE IOHEXOL 300 MG/ML  SOLN COMPARISON:  November 06, 2012 FINDINGS: Lower chest: Small hiatal hernia.  No other abnormalities in the lung bases are lower chest. Hepatobiliary: The patient is status post cholecystectomy. Mild intra and extrahepatic biliary duct dilatation is unchanged. Portal vein is patent. Pancreas: Unremarkable. No pancreatic ductal dilatation or surrounding inflammatory changes. Spleen: Normal in size without focal abnormality. Adrenals/Urinary Tract: The adrenal glands,  kidneys, and ureters are normal. The bladder is poorly distended limiting evaluation. Bladder wall thickening not excluded. Stomach/Bowel: The stomach and small bowel are normal. There is a large stool ball in the rectum without perirectal fat stranding or pneumatosis. The stool ball measures at least 7.8 x 7.4 x 6.5 cm. An anastomotic ring is identified in the region of the sigmoid colon. There is wall thickening associated with the colon. The distal half of the transverse colon and the descending colon are continuously affected. The cecum demonstrates some wall thickening and increased mucosal enhancement as seen on coronal image 29 and axial image 45 suggesting involvement of the cecum as well. There is sparing of most of the ascending colon and the proximal half of the transverse colon. There is mild pericolonic stranding in some locations, particularly adjacent to the descending colon. The appendix is not visualized but there is no secondary evidence of appendicitis. Vascular/Lymphatic: Calcified atherosclerosis is seen in the nonaneurysmal aorta, extending into the iliac and femoral vessels. No dissection. No adenopathy. Reproductive: Uterus and bilateral adnexa are unremarkable. Other: There is a ventral hernia in the pelvis containing loops of small bowel without obstruction. This finding is unchanged since 2014. There is a small ventral hernia anteriorly best seen on sagittal image 88. There is some enhancing soft tissue nodularity surrounding this hernia well seen on sagittal image 86. This finding is new since 2014.  Musculoskeletal: No acute or significant osseous findings. IMPRESSION: 1. There is a large stool ball in the rectum measuring 7.8 x 7.4 x 6.5 cm without wall thickening, pneumatosis, or adjacent fat stranding. 2. Wall thickening associated with the cecum, the distal half of the transverse colon, and the descending colon with some regions of pericolonic fat stranding. This finding is consistent with colitis which could be inflammatory, infectious, or ischemic. 3. The bladder is poorly distended limiting evaluation but nonspecific wall thickening is not excluded. 4. There is a small ventral hernia in the upper abdomen at midline containing fat. There is enhancing soft tissue nodularity surrounding this hernia not seen in 2014. This soft tissue nodularity could be inflammatory, neoplastic, or less likely infectious. 5. Calcified atherosclerosis in the nonaneurysmal aorta. Electronically Signed   By: Dorise Bullion III M.D   On: 03/07/2021 20:05     Assessment/Plan 87 year old female with history of diabetes, hypertension, hypothyroidism, COPD, chronic respiratory failure home O2 which she uses as needed, polymyalgia rheumatica on chronic low-dose prednisone, presenting with a presyncopal episode following acute blood loss suspected vaginal versus GI .     Acute blood loss anemia, suspect vaginal   Presyncope secondary to symptomatic anemia -Patient with suspected vaginal bleeding x3 days, with a larger episode on day of arrival resulting in presyncope/vasovagal event - Patient was seen in ED earlier with vaginal bleed on pelvic exam.  Stool guaiac repeatedly negative.  Had a large formed BM while in ER that was brown and formed. - Pelvic ultrasound unremarkable - CT abdomen showing possible colitis as well as large stool ball - Hemoglobin 8.5, down from 9.5 earlier but could be hemodilution all as patient presently hemodynamically stable - Serial H&H and transfuse if necessary - GYN consulted    Leukocytosis   Acute colitis on CT -Leukocytosis of 17,000, up from 9700 a few hours prior on first visit to ER, suspecting acute reactant, not infection - Patient denies abdominal pain, fever or chills and had a formed BM while in the ED - Low clinical correlation with acute colitis -  No antibiotics for now    Essential hypertension - Continue home lisinopril    Hypothyroidism - History of hypothyroidism but no levothyroxine on med list    COPD (chronic obstructive pulmonary disease) (HCC) - Albuterol as needed    Type 2 diabetes mellitus without complication, - Appears diet controlled - Sliding scale insulin coverage   Polymyalgia rheumatica (HCC) - On prednisone and Plaquenil daily    Chronic respiratory failure with hypoxia - O2 sat on room air with arrival of EMS was 70% - Resume home O2 and titrate as needed - Follow-up chest x-ray to evaluate for acute pathology.    DVT prophylaxis: SCDs Code Status: full code  Family Communication:  none  Disposition Plan: Back to previous home environment Consults called: GYN Status: Observation    Athena Masse MD [facility]     03/07/2021, 9:31 PM

## 2021-03-07 NOTE — ED Notes (Signed)
Pt to ct 

## 2021-03-07 NOTE — ED Notes (Signed)
Called son who will be picking up pt. He is on his way.

## 2021-03-07 NOTE — ED Provider Notes (Signed)
[facility] Emergency Department Provider Note  Time seen: 11:28 AM  I have reviewed the triage vital signs and the nursing notes.   HISTORY  Chief Complaint Vaginal Bleeding and Rectal Bleeding   HPI Stacy [name] is a 87 y.o. female with a past medical history of arthritis, asthma, hypertension, COPD, diabetes, presents to the emergency department for bleeding.  Patient states over the past 2 days she has noticed a small amount of blood in her underwear.  Patient states she is not sure if this is vaginal bleeding or rectal bleeding.  Patient states this morning the bleeding increased so she came to the emergency department for evaluation.  Patient denies any abdominal pain or pelvic pain.  Denies any fever.  Overall patient is well-appearing, no distress.   Past Medical History:  Diagnosis Date   Arthritis 1940   Asthma    Bowel trouble    Cancer ([place]) 2006   DCIS left breast   Hypertension 1940   Incisional hernia 2014   Personal history of radiation therapy    Polymyalgia ([place])    Thyroid disorder    Vitiligo 2012    Patient Active Problem List   Diagnosis Date Noted   Encounter for general adult medical examination with abnormal findings 07/05/2018   Uncontrolled type 2 diabetes mellitus with hyperglycemia ([place]) 07/05/2018   Neoplasm of uncertain behavior of skin of hand 07/05/2018   Simple chronic bronchitis ([place]) 12/31/2017   Type 2 diabetes mellitus with hyperglycemia ([place]) 12/31/2017   Congestive heart failure ([place]) 10/20/2017   Primary generalized (osteo)arthritis 09/21/2017   Vitamin B12 deficiency anemia, unspecified 09/21/2017   Chronic kidney disease, unspecified 09/21/2017   Vitamin D deficiency, unspecified 09/21/2017   Chronic obstructive pulmonary disease with acute lower respiratory infection ([place]) 09/21/2017   Unspecified hearing loss, bilateral 09/21/2017   Age-related osteoporosis without current pathological fracture 09/21/2017    Other amnesia 09/21/2017   Cough 09/21/2017   Allergic rhinitis, unspecified 09/21/2017   Type 2 diabetes mellitus without complication, without long-term current use of insulin ([place]) 09/21/2017   Other seborrheic dermatitis 09/21/2017   Unspecified cervical disc disorder, mid-cervical region, unspecified level 09/21/2017   Neuromuscular dysfunction of bladder, unspecified 09/21/2017   Allergic rhinitis due to pollen 09/21/2017   Other primary ovarian failure 09/21/2017   Shortness of breath 09/21/2017   Acute bronchitis, unspecified 09/21/2017   Benign and innocent cardiac murmurs 09/21/2017   Dysuria 09/21/2017   Retention of urine, unspecified 09/21/2017   Unspecified urinary incontinence 09/21/2017   Mixed incontinence 09/21/2017   Insomnia, unspecified 09/21/2017   Dizziness and giddiness 09/21/2017   Polymyalgia rheumatica ([place]) 09/21/2017   Low back pain 09/21/2017   Hypoxemia 09/21/2017   Spondylosis without myelopathy or radiculopathy, lumbosacral region 09/21/2017   Pain in unspecified knee 09/21/2017   Mixed hyperlipidemia 09/21/2017   Cardiomegaly 09/21/2017   Abnormality of gait and mobility 09/21/2017   Personal history of malignant neoplasm of breast 04/15/2013   History of breast cancer 12/24/2012   Incisional hernia, without obstruction or gangrene 12/24/2012   Essential hypertension    Vitiligo    Arthritis    Thyroid disorder    Bowel trouble     Past Surgical History:  Procedure Laterality Date   BREAST BIOPSY Left 2006   +   BREAST SURGERY Left 2006   lumpectomy   CHOLECYSTECTOMY  1969   COLON SURGERY  2004   COLONOSCOPY  2014   Dr. Candace Cruise   EYE  SURGERY  2002   [place]   HERNIA REPAIR  2013   left breast biopsy   2011    Prior to Admission medications   Medication Sig Start Date End Date Taking? Authorizing Provider  acetaminophen (TYLENOL) 325 MG tablet Take 650 mg by mouth every 6 (six) hours as needed for pain.    [provider]  Flaxseed, Linseed, (FLAX SEED OIL PO) Take 1 tablet by mouth daily.     [provider]  fluticasone (FLONASE) 50 MCG/ACT nasal spray Place 2 sprays into both nostrils daily.  10/09/14   [provider]  gabapentin (NEURONTIN) 100 MG capsule TAKE 1 CAPSULE TWICE DAILY 01/05/21   [name], MD  glucose blood (ACCU-CHEK AVIVA PLUS) test strip USE TO CHECK GLUCOSE ONCE DAILY dx e11.65 01/31/20   Ronnell Freshwater, NP  hydroxychloroquine (PLAQUENIL) 200 MG tablet Take 1 tablet by mouth daily. 07/26/18   [provider]  lisinopril (PRINIVIL,ZESTRIL) 5 MG tablet Take 1 tablet (5 mg total) by mouth daily. 10/05/18   Ronnell Freshwater, NP  OXYGEN Inhale into the lungs as needed.    [provider]  predniSONE (DELTASONE) 10 MG tablet Take 1/2 to 1 tablet po QD 02/28/20   Ronnell Freshwater, NP  traMADol (ULTRAM) 50 MG tablet Take 1 tablet po TID prn pain. 06/09/20   Ronnell Freshwater, NP    Allergies  Allergen Reactions   Aspirin Itching and Other (See Comments)    Dizzy/passing out   Augmentin [Amoxicillin-Pot Clavulanate]    Codeine Other (See Comments)    dizziness   Doxycycline Hyclate Other (See Comments)   Sulfamethoxazole-Trimethoprim Diarrhea   Tape Itching   Tramadol Other (See Comments)    Makes her feel dizzy and feels like passing out Pt states she can now take this medication.  (02/27/18)   Vitamin D Analogs    Azithromycin Rash and Hives    Family History  Problem Relation Age of Onset   Heart disease Mother    Emphysema Mother    Arthritis Father    Cancer Father    Breast cancer Neg Hx     Social History Social History   Tobacco Use   Smoking status: Never   Smokeless tobacco: Never  Vaping Use   Vaping Use: Never used  Substance Use Topics   Alcohol use: No   Drug use: No    Review of Systems Constitutional: Negative for fever. Cardiovascular: Negative for chest pain. Respiratory: Negative for shortness of  breath. Gastrointestinal: Negative for abdominal pain.  Negative for pelvic pain. Musculoskeletal: Negative for musculoskeletal complaints Neurological: Negative for headache All other ROS negative  ____________________________________________   PHYSICAL EXAM:  VITAL SIGNS: ED Triage Vitals  Enc Vitals Group     BP 03/07/21 1116 123/68     Pulse Rate 03/07/21 1108 93     Resp 03/07/21 1108 16     Temp 03/07/21 1108 98 F (36.7 C)     Temp Source 03/07/21 1108 Oral     SpO2 03/07/21 1108 96 %     Weight 03/07/21 1111 158 lb 11.7 oz (72 kg)     Height 03/07/21 1111 5\' 1"  (1.549 m)     Head Circumference --      Peak Flow --      Pain Score 03/07/21 1109 0     Pain Loc --      Pain Edu? --  Excl. in [place]? --    Constitutional: Alert and oriented. Well appearing and in no distress. Eyes: Normal exam ENT      Head: Normocephalic and atraumatic.      Mouth/Throat: Mucous membranes are moist. Cardiovascular: Normal rate, regular rhythm.  Respiratory: Normal respiratory effort without tachypnea nor retractions. Breath sounds are clear  Gastrointestinal: Soft and nontender. No distention.  Rectal examination shows light brown stool guaiac negative.  Pelvic examination shows vaginal bleeding unclear origin. Musculoskeletal: Nontender with normal range of motion in all extremities.  Neurologic:  Normal speech and language. No gross focal neurologic deficits Skin:  Skin is warm, dry and intact.  Psychiatric: Mood and affect are normal.     RADIOLOGY  Ultrasound shows no acute abnormalities.  ____________________________________________   INITIAL IMPRESSION / ASSESSMENT AND PLAN / ED COURSE  Pertinent labs & imaging results that were available during my care of the patient were reviewed by me and considered in my medical decision making (see chart for details).   Patient presents emergency department for vaginal bleeding.  Rectal examination shows light brown stool no  blood guaiac negative.  Pelvic examination shows blood coming from the vagina with no clear external source of bleeding identified.  We will check labs obtain a pelvic ultrasound to further evaluate.  Differential would include trauma, oncologic process, dysfunctional uterine bleeding, hormonal imbalance.  Ultrasound is negative for acute abnormality.  Given the patient's vaginal bleeding we will have her follow-up with OB/GYN for further evaluation.  Patient agreeable to plan of care.  Patient is currently satting 93% on room air, states she wears 2 L of oxygen at home as needed.  Denies any shortness of breath.  AZYIAH BO was evaluated in Emergency Department on 03/07/2021 for the symptoms described in the history of present illness. She was evaluated in the context of the global COVID-19 pandemic, which necessitated consideration that the patient might be at risk for infection with the SARS-CoV-2 virus that causes COVID-19. Institutional protocols and algorithms that pertain to the evaluation of patients at risk for COVID-19 are in a state of rapid change based on information released by regulatory bodies including the CDC and federal and state organizations. These policies and algorithms were followed during the patient's care in the ED.  ____________________________________________   FINAL CLINICAL IMPRESSION(S) / ED DIAGNOSES  Vaginal bleeding   Harvest Dark, MD 03/07/21 1332

## 2021-03-07 NOTE — ED Triage Notes (Signed)
Pt to ED via [facility] from home, pt seen in ED today for vaginal/rectal bleeding. Pt was D/C home. Pt son found pt semi-responsive in the floor beside the toilet. There was a large amount of blood in the toilet, a large clot on the toilet, and a large amount of bright red blood coming from pts rectum. Pts SpO2 was in the 70's on room air, pt is supposed to be on home O2 but is non-compliant per EMS and family. Pt pale and diaphoretic upon EMS arrival. Pt is alert on arrival to ED .

## 2021-03-07 NOTE — Discharge Instructions (Addendum)
Please call the number provided for OB/GYN to arrange a follow-up appointment as soon as possible.  Return to the emergency department for any significant increase in bleeding, or any other symptom personally concerning to yourself.

## 2021-03-08 DIAGNOSIS — H9190 Unspecified hearing loss, unspecified ear: Secondary | ICD-10-CM | POA: Diagnosis not present

## 2021-03-08 DIAGNOSIS — M5092 Unspecified cervical disc disorder, mid-cervical region, unspecified level: Secondary | ICD-10-CM | POA: Diagnosis not present

## 2021-03-08 DIAGNOSIS — I10 Essential (primary) hypertension: Secondary | ICD-10-CM | POA: Diagnosis present

## 2021-03-08 DIAGNOSIS — M353 Polymyalgia rheumatica: Secondary | ICD-10-CM | POA: Diagnosis present

## 2021-03-08 DIAGNOSIS — J449 Chronic obstructive pulmonary disease, unspecified: Secondary | ICD-10-CM

## 2021-03-08 DIAGNOSIS — Z20822 Contact with and (suspected) exposure to covid-19: Secondary | ICD-10-CM | POA: Diagnosis present

## 2021-03-08 DIAGNOSIS — E872 Acidosis: Secondary | ICD-10-CM | POA: Diagnosis present

## 2021-03-08 DIAGNOSIS — N939 Abnormal uterine and vaginal bleeding, unspecified: Secondary | ICD-10-CM | POA: Diagnosis present

## 2021-03-08 DIAGNOSIS — J45909 Unspecified asthma, uncomplicated: Secondary | ICD-10-CM | POA: Diagnosis not present

## 2021-03-08 DIAGNOSIS — R531 Weakness: Secondary | ICD-10-CM | POA: Diagnosis not present

## 2021-03-08 DIAGNOSIS — N179 Acute kidney failure, unspecified: Secondary | ICD-10-CM | POA: Diagnosis present

## 2021-03-08 DIAGNOSIS — E039 Hypothyroidism, unspecified: Secondary | ICD-10-CM

## 2021-03-08 DIAGNOSIS — E440 Moderate protein-calorie malnutrition: Secondary | ICD-10-CM | POA: Diagnosis present

## 2021-03-08 DIAGNOSIS — E1140 Type 2 diabetes mellitus with diabetic neuropathy, unspecified: Secondary | ICD-10-CM | POA: Diagnosis not present

## 2021-03-08 DIAGNOSIS — Z7952 Long term (current) use of systemic steroids: Secondary | ICD-10-CM | POA: Diagnosis not present

## 2021-03-08 DIAGNOSIS — R5381 Other malaise: Secondary | ICD-10-CM | POA: Diagnosis not present

## 2021-03-08 DIAGNOSIS — E871 Hypo-osmolality and hyponatremia: Secondary | ICD-10-CM | POA: Diagnosis not present

## 2021-03-08 DIAGNOSIS — Z7189 Other specified counseling: Secondary | ICD-10-CM | POA: Diagnosis not present

## 2021-03-08 DIAGNOSIS — R627 Adult failure to thrive: Secondary | ICD-10-CM | POA: Diagnosis present

## 2021-03-08 DIAGNOSIS — N189 Chronic kidney disease, unspecified: Secondary | ICD-10-CM | POA: Diagnosis not present

## 2021-03-08 DIAGNOSIS — K5900 Constipation, unspecified: Secondary | ICD-10-CM | POA: Diagnosis not present

## 2021-03-08 DIAGNOSIS — I130 Hypertensive heart and chronic kidney disease with heart failure and stage 1 through stage 4 chronic kidney disease, or unspecified chronic kidney disease: Secondary | ICD-10-CM | POA: Diagnosis not present

## 2021-03-08 DIAGNOSIS — E119 Type 2 diabetes mellitus without complications: Secondary | ICD-10-CM | POA: Diagnosis not present

## 2021-03-08 DIAGNOSIS — R55 Syncope and collapse: Secondary | ICD-10-CM | POA: Diagnosis present

## 2021-03-08 DIAGNOSIS — N390 Urinary tract infection, site not specified: Secondary | ICD-10-CM | POA: Diagnosis present

## 2021-03-08 DIAGNOSIS — K559 Vascular disorder of intestine, unspecified: Secondary | ICD-10-CM | POA: Diagnosis present

## 2021-03-08 DIAGNOSIS — I471 Supraventricular tachycardia: Secondary | ICD-10-CM | POA: Diagnosis present

## 2021-03-08 DIAGNOSIS — J9601 Acute respiratory failure with hypoxia: Secondary | ICD-10-CM | POA: Diagnosis not present

## 2021-03-08 DIAGNOSIS — D649 Anemia, unspecified: Secondary | ICD-10-CM | POA: Diagnosis not present

## 2021-03-08 DIAGNOSIS — D62 Acute posthemorrhagic anemia: Secondary | ICD-10-CM | POA: Diagnosis present

## 2021-03-08 DIAGNOSIS — B9620 Unspecified Escherichia coli [E. coli] as the cause of diseases classified elsewhere: Secondary | ICD-10-CM | POA: Diagnosis present

## 2021-03-08 DIAGNOSIS — R1311 Dysphagia, oral phase: Secondary | ICD-10-CM | POA: Diagnosis not present

## 2021-03-08 DIAGNOSIS — M1612 Unilateral primary osteoarthritis, left hip: Secondary | ICD-10-CM | POA: Diagnosis not present

## 2021-03-08 DIAGNOSIS — K219 Gastro-esophageal reflux disease without esophagitis: Secondary | ICD-10-CM | POA: Diagnosis not present

## 2021-03-08 DIAGNOSIS — Z6831 Body mass index (BMI) 31.0-31.9, adult: Secondary | ICD-10-CM | POA: Diagnosis not present

## 2021-03-08 DIAGNOSIS — E11649 Type 2 diabetes mellitus with hypoglycemia without coma: Secondary | ICD-10-CM | POA: Diagnosis present

## 2021-03-08 DIAGNOSIS — J9611 Chronic respiratory failure with hypoxia: Secondary | ICD-10-CM | POA: Diagnosis present

## 2021-03-08 DIAGNOSIS — E876 Hypokalemia: Secondary | ICD-10-CM | POA: Diagnosis not present

## 2021-03-08 DIAGNOSIS — K529 Noninfective gastroenteritis and colitis, unspecified: Secondary | ICD-10-CM | POA: Diagnosis not present

## 2021-03-08 DIAGNOSIS — I509 Heart failure, unspecified: Secondary | ICD-10-CM | POA: Diagnosis not present

## 2021-03-08 DIAGNOSIS — Z886 Allergy status to analgesic agent status: Secondary | ICD-10-CM | POA: Diagnosis not present

## 2021-03-08 DIAGNOSIS — Z515 Encounter for palliative care: Secondary | ICD-10-CM | POA: Diagnosis not present

## 2021-03-08 DIAGNOSIS — E1122 Type 2 diabetes mellitus with diabetic chronic kidney disease: Secondary | ICD-10-CM | POA: Diagnosis not present

## 2021-03-08 LAB — URINALYSIS, ROUTINE W REFLEX MICROSCOPIC
Bilirubin Urine: NEGATIVE
Glucose, UA: NEGATIVE mg/dL
Hgb urine dipstick: NEGATIVE
Ketones, ur: 5 mg/dL — AB
Nitrite: NEGATIVE
Protein, ur: NEGATIVE mg/dL
Specific Gravity, Urine: 1.042 — ABNORMAL HIGH (ref 1.005–1.030)
pH: 5 (ref 5.0–8.0)

## 2021-03-08 LAB — BASIC METABOLIC PANEL
Anion gap: 9 (ref 5–15)
BUN: 25 mg/dL — ABNORMAL HIGH (ref 8–23)
CO2: 27 mmol/L (ref 22–32)
Calcium: 7.8 mg/dL — ABNORMAL LOW (ref 8.9–10.3)
Chloride: 100 mmol/L (ref 98–111)
Creatinine, Ser: 1.57 mg/dL — ABNORMAL HIGH (ref 0.44–1.00)
GFR, Estimated: 31 mL/min — ABNORMAL LOW (ref >60)
Glucose, Bld: 116 mg/dL — ABNORMAL HIGH (ref 70–99)
Potassium: 4.8 mmol/L (ref 3.5–5.1)
Sodium: 136 mmol/L (ref 135–145)

## 2021-03-08 LAB — CBG MONITORING, ED
Glucose-Capillary: 141 mg/dL — ABNORMAL HIGH (ref 70–99)
Glucose-Capillary: 108 mg/dL — ABNORMAL HIGH (ref 70–99)
Glucose-Capillary: 122 mg/dL — ABNORMAL HIGH (ref 70–99)

## 2021-03-08 LAB — CBC
HCT: 37.1 % (ref 36.0–46.0)
Hemoglobin: 12.5 g/dL (ref 12.0–15.0)
MCH: 28.2 pg (ref 26.0–34.0)
MCHC: 33.7 g/dL (ref 30.0–36.0)
MCV: 83.7 fL (ref 80.0–100.0)
Platelets: 153 10*3/uL (ref 150–400)
RBC: 4.43 MIL/uL (ref 3.87–5.11)
RDW: 14.9 % (ref 11.5–15.5)
WBC: 17.6 10*3/uL — ABNORMAL HIGH (ref 4.0–10.5)
nRBC: 0 % (ref 0.0–0.2)

## 2021-03-08 LAB — TYPE AND SCREEN
ABO/RH(D): O POS
Antibody Screen: NEGATIVE

## 2021-03-08 LAB — MAGNESIUM
Magnesium: 0.9 mg/dL — CL (ref 1.7–2.4)
Magnesium: 2.9 mg/dL — ABNORMAL HIGH (ref 1.7–2.4)

## 2021-03-08 LAB — LACTIC ACID, PLASMA: Lactic Acid, Venous: 2 mmol/L (ref 0.5–1.9)

## 2021-03-08 LAB — GLUCOSE, CAPILLARY
Glucose-Capillary: 76 mg/dL (ref 70–99)
Glucose-Capillary: 88 mg/dL (ref 70–99)
Glucose-Capillary: 72 mg/dL (ref 70–99)

## 2021-03-08 LAB — HEMOGLOBIN AND HEMATOCRIT, BLOOD
HCT: 16.6 % — ABNORMAL LOW (ref 36.0–46.0)
HCT: 41.6 % (ref 36.0–46.0)
Hemoglobin: 5.3 g/dL — ABNORMAL LOW (ref 12.0–15.0)
Hemoglobin: 13.7 g/dL (ref 12.0–15.0)

## 2021-03-08 LAB — PREPARE RBC (CROSSMATCH)

## 2021-03-08 MED ORDER — SODIUM CHLORIDE 0.9% IV SOLUTION
Freq: Once | INTRAVENOUS | Status: DC
  Filled 2021-03-08: qty 250

## 2021-03-08 MED ORDER — HYDROXYCHLOROQUINE SULFATE 200 MG PO TABS: 200.0000 mg | ORAL_TABLET | ORAL | Status: DC

## 2021-03-08 MED ORDER — HYDROXYCHLOROQUINE SULFATE 200 MG PO TABS
400.0000 mg | ORAL_TABLET | ORAL | Status: DC
  Administered 2021-03-08: 400 mg via ORAL
  Filled 2021-03-08: qty 2

## 2021-03-08 MED ORDER — ACETAMINOPHEN 325 MG PO TABS: 650.0000 mg | ORAL_TABLET | Freq: Four times a day (QID) | ORAL | Status: DC | PRN

## 2021-03-08 MED ORDER — MAGNESIUM SULFATE 4 GM/100ML IV SOLN
4.0000 g | Freq: Once | INTRAVENOUS | Status: AC
  Administered 2021-03-08: 4 g via INTRAVENOUS
  Filled 2021-03-08: qty 100

## 2021-03-08 MED ORDER — MAGNESIUM SULFATE 2 GM/50ML IV SOLN
2.0000 g | Freq: Once | INTRAVENOUS | Status: AC
  Administered 2021-03-08: 2 g via INTRAVENOUS
  Filled 2021-03-08: qty 50

## 2021-03-08 MED ORDER — STERILE WATER FOR INJECTION IV SOLN
INTRAVENOUS | Status: DC
  Filled 2021-03-08: qty 150
  Filled 2021-03-08 (×2): qty 1000

## 2021-03-08 MED ORDER — ORAL CARE MOUTH RINSE
15.0000 mL | Freq: Two times a day (BID) | OROMUCOSAL | Status: DC
  Administered 2021-03-08 – 2021-03-16 (×14): 15 mL via OROMUCOSAL
  Filled 2021-03-08 (×2): qty 15

## 2021-03-08 MED ORDER — MAGNESIUM SULFATE 4 GM/100ML IV SOLN: 4.0000 g | Freq: Once | INTRAVENOUS | Status: DC

## 2021-03-08 MED ORDER — SODIUM CHLORIDE 0.9 % IV BOLUS
1000.0000 mL | Freq: Once | INTRAVENOUS | Status: AC
  Administered 2021-03-08: 1000 mL via INTRAVENOUS

## 2021-03-08 MED ORDER — PREDNISONE 10 MG PO TABS
5.0000 mg | ORAL_TABLET | Freq: Every day | ORAL | Status: DC
  Administered 2021-03-09 – 2021-03-13 (×5): 5 mg via ORAL
  Filled 2021-03-08 (×5): qty 1

## 2021-03-08 MED ORDER — LACTATED RINGERS IV SOLN: INTRAVENOUS | Status: DC

## 2021-03-08 MED ORDER — SODIUM CHLORIDE 0.9 % IV SOLN
1.0000 g | INTRAVENOUS | Status: AC
  Administered 2021-03-08 – 2021-03-15 (×8): 1 g via INTRAVENOUS
  Filled 2021-03-08 (×7): qty 1
  Filled 2021-03-08: qty 10

## 2021-03-08 MED ORDER — MAGNESIUM SULFATE 50 % IJ SOLN: 6.0000 g | Freq: Once | INTRAVENOUS | Status: DC

## 2021-03-08 MED ORDER — HYDROXYCHLOROQUINE SULFATE 200 MG PO TABS
200.0000 mg | ORAL_TABLET | ORAL | Status: DC
  Filled 2021-03-08: qty 1

## 2021-03-08 NOTE — Progress Notes (Signed)
70 cc's of dark red with lots of sediment liquid in commode after patient got up with me to [facility].   She was a max one assist, barely able to stand up and felt weak after the activity.

## 2021-03-08 NOTE — Discharge Planning (Signed)
Report given to Laureen Ochs, RN - Will continue care - transferring to room 112, floor 1c.

## 2021-03-08 NOTE — Progress Notes (Addendum)
Cross Cover SVT run, likely from severe anemia. Or severe metabolic acidosis.  IV fluids changed to bicarb.  Will repeat chem panel in 4 hours. Check mag level, supplement with goal level above 2  Also noted hgb previusly collected now 5.3, no previous transfusions 2 units PRBC orderd

## 2021-03-08 NOTE — Consult Note (Signed)
Obstetrics & Gynecology History and Physical Note  Date of Consultation: 03/08/2021   Requesting Provider: [facility] ER  Primary OBGYN: None Primary Care Provider: [name]  Reason for Consultation: Bleeding  History of Present Illness: Ms. Pletz is a 87 y.o. [id number] (No LMP recorded. Patient is postmenopausal.), with the above CC. Pt was noted to have light vag bleeding this am, then heavier bleeding this pm.  No pain.  No known rectal bleeding.  No prior h/o uterine bleeding or concerns since menopause.  [place] normal w ES 2 mm. CT also GYN normal.  ROS: A review of systems was performed and was complete and comprehensive, except as stated in the above HPI.  OBGYN History: As per HPI. OB History     Gravida  0   Para  0   Term  0   Preterm      AB      Living  4      SAB      IAB      Ectopic      Multiple      Live Births           Obstetric Comments  First pregnancy 22 First menstrua 16          Past Medical History: Past Medical History:  Diagnosis Date   Arthritis 1940   Asthma    Bowel trouble    Cancer ([place]) 2006   DCIS left breast   Hypertension 1940   Incisional hernia 2014   Personal history of radiation therapy    Polymyalgia ([place])    Thyroid disorder    Vitiligo 2012    Past Surgical History: Past Surgical History:  Procedure Laterality Date   BREAST BIOPSY Left 2006   +   BREAST SURGERY Left 2006   lumpectomy   CHOLECYSTECTOMY  1969   COLON SURGERY  2004   COLONOSCOPY  2014   Dr. Candace Cruise   EYE SURGERY  2002   FOOT SURGERY  1996   HERNIA REPAIR  2013   left breast biopsy   2011    Family History:  Family History  Problem Relation Age of Onset   Heart disease Mother    Emphysema Mother    Arthritis Father    Cancer Father    Breast cancer Neg Hx    She denies any female cancers, bleeding or blood clotting disorders.   Social History:  Social History   Socioeconomic History   Marital status: Married    Spouse  name: Not on file   Number of children: Not on file   Years of education: Not on file   Highest education level: Not on file  Occupational History   Not on file  Tobacco Use   Smoking status: Never   Smokeless tobacco: Never  Vaping Use   Vaping Use: Never used  Substance and Sexual Activity   Alcohol use: No   Drug use: No   Sexual activity: Not on file  Other Topics Concern   Not on file  Social History Narrative   Not on file   Social Determinants of Health   Financial Resource Strain: Not on file  Food Insecurity: Not on file  Transportation Needs: Not on file  Physical Activity: Not on file  Stress: Not on file  Social Connections: Not on file  Intimate Partner Violence: Not on file    Allergy: Allergies  Allergen Reactions   Aspirin Itching and Other (See Comments)  Dizzy/passing out   Augmentin [Amoxicillin-Pot Clavulanate]    Codeine Other (See Comments)    dizziness   Doxycycline Hyclate Other (See Comments)   Sulfamethoxazole-Trimethoprim Diarrhea   Tape Itching   Tramadol Other (See Comments)    Makes her feel dizzy and feels like passing out Pt states she can now take this medication.  (02/27/18)   Vitamin D Analogs    Azithromycin Rash and Hives    Current Outpatient Medications: (Not in a hospital admission)   Hospital Medications: Current Facility-Administered Medications  Medication Dose Route Frequency Provider Last Rate Last Admin   0.9 %  sodium chloride infusion   Intravenous Continuous Athena Masse, MD   Held at 03/07/21 2148   acetaminophen (TYLENOL) tablet 650 mg  650 mg Oral Q6H PRN Athena Masse, MD       Or   acetaminophen (TYLENOL) suppository 650 mg  650 mg Rectal Q6H PRN Athena Masse, MD       HYDROcodone-acetaminophen (NORCO/VICODIN) 5-325 MG per tablet 1-2 tablet  1-2 tablet Oral Q4H PRN Athena Masse, MD       insulin aspart (novoLOG) injection 0-5 Units  0-5 Units Subcutaneous QHS Judd Gaudier V, MD        insulin aspart (novoLOG) injection 0-9 Units  0-9 Units Subcutaneous TID WC Athena Masse, MD       ondansetron (ZOFRAN) tablet 4 mg  4 mg Oral Q6H PRN Athena Masse, MD       Or   ondansetron [facility]) injection 4 mg  4 mg Intravenous Q6H PRN Athena Masse, MD       senna-docusate (Senokot-S) tablet 1 tablet  1 tablet Oral QHS PRN Athena Masse, MD       sodium chloride flush (NS) 0.9 % injection 3 mL  3 mL Intravenous Q12H Athena Masse, MD   3 mL at 03/07/21 2148   Current Outpatient Medications  Medication Sig Dispense Refill   acetaminophen (TYLENOL) 325 MG tablet Take 650 mg by mouth every 6 (six) hours as needed for pain.     Flaxseed, Linseed, (FLAX SEED OIL PO) Take 1 tablet by mouth daily.      fluticasone (FLONASE) 50 MCG/ACT nasal spray Place 2 sprays into both nostrils daily.      gabapentin (NEURONTIN) 100 MG capsule TAKE 1 CAPSULE TWICE DAILY 180 capsule 0   glucose blood (ACCU-CHEK AVIVA PLUS) test strip USE TO CHECK GLUCOSE ONCE DAILY dx e11.65 100 each 3   hydroxychloroquine (PLAQUENIL) 200 MG tablet Take 1 tablet by mouth daily.     lisinopril (PRINIVIL,ZESTRIL) 5 MG tablet Take 1 tablet (5 mg total) by mouth daily. 90 tablet 3   OXYGEN Inhale into the lungs as needed.     predniSONE (DELTASONE) 10 MG tablet Take 1/2 to 1 tablet po QD 90 tablet 3   traMADol (ULTRAM) 50 MG tablet Take 1 tablet po TID prn pain. 15 tablet 0    Physical Exam: Vitals:   03/07/21 2145 03/07/21 2200 03/07/21 2215 03/07/21 2230  BP: (!) 150/79 (!) 146/88 139/81 (!) 142/76  Pulse: 92 92 93 93  Resp: 20 (!) 21 (!) 22 18  SpO2: 100% 100% 100% 100%    Temp:  [98 F (36.7 C)] 98 F (36.7 C) (06/12 1430) Pulse Rate:  [74-98] 93 (06/12 2230) Resp:  [16-24] 18 (06/12 2230) BP: (105-154)/(55-89) 142/76 (06/12 2230) SpO2:  [79 %-100 %] 100 % (06/12 2230) Weight:  [43  kg] 72 kg (06/12 1111) No intake/output data recorded. No intake/output data recorded. No intake or output data in the  24 hours ending 03/08/21 0031  There is no height or weight on file to calculate BMI. Constitutional: Well nourished, well developed female in no acute distress.  HEENT: normal Neck:  Supple, normal appearance, and no thyromegaly  Cardiovascular:Regular rate and rhythm.  No murmurs, rubs or gallops. Respiratory:  Clear to auscultation bilateral. Normal respiratory effort Abdomen: positive bowel sounds and no masses, hernias; diffusely non tender to palpation, non distended Neuro: grossly intact Psych:  Normal mood and affect.  Skin:  Warm and dry.  MS: normal gait and normal bilateral lower extremity strength/ROM/symmetry Lymphatic:  No inguinal lymphadenopathy.   Pelvic exam: is not limited by body habitus EGBUS: within normal limits Vagina: within normal limits. Bladder and Urethra: normal. Cervix: small and flush w vag apex;  slight blood noted at os Uterus:  normal size, contour, position, consistency, mobility, non-tender Adnexa: no mass, fullness, tenderness Rectal int and ext hemorrhoids (guaiac neg stool x2)  Attempted EMB, but unable to dilate or pass endometrial pipelle in ER setting   Recent Labs  Lab 03/07/21 1113 03/07/21 1824  WBC 9.7 17.3*  HGB 9.4* 8.5*  HCT 29.6* 28.2*  PLT 225 238   Recent Labs  Lab 03/07/21 1113 03/07/21 1824  NA 136 136  K 3.7 4.0  CL 103 105  CO2 27 14*  BUN 20 21  CREATININE 1.36* 1.69*  CALCIUM 8.5* 8.6*  PROT 6.3*  --   BILITOT 0.4  --   ALKPHOS 55  --   ALT 7  --   AST 14*  --   GLUCOSE 112* 215*   No results for input(s): APTT, INR, PTT in the last 168 hours.  Invalid input(s): DRHAPTT Recent Labs  Lab 03/07/21 2022  [place] [facility]    Imaging:  Ultrasound independently reviewed/interpreted by self.  Assessment: Ms. Latorre is a 87 y.o. [id number] (No LMP recorded. Patient is postmenopausal.) who presented to the ED with complaints of bleeding; findings are consistent with normal GYN imaging yet question of source or  cause of bleeding.  Plan: Low risk for endometrial cancer or cause of bleeding with such a thin (and appropriate for age) endometrial lining No vaginal laceration as etiology She has received one unit blood in ER Admitted to hospitalist service Will cont to monitor for episodes again of bleeding May consider EMB in OR or even better outpatient setting w equipment and position aids that may add to success of procedure, despite anticipation of low yield based on imaging results  Barnett Applebaum, MD, [facility], [facility] 03/08/2021  12:31 AM Pager [phone]

## 2021-03-08 NOTE — ED Notes (Signed)
Pt with run of SVT on monitor that lasted approximately 12 beats. Pt assessed and sleeping in bed. MD/NP contacted on update.

## 2021-03-08 NOTE — Progress Notes (Addendum)
[name] PROGRESS NOTE    Stacy [name]  PQZ:[id number] DOB: [birth year] DOA: 03/07/2021 PCP: [name], MD      Brief Narrative:  Stacy [name] is a 87 y.o. F with PMR on Plaquenil, prednisone, hx DCIS, HTN and well controlled asthma who presented with being found down.  Patient was in [name] until few days ago when she noticed some scant vaginal bleeding (blood in underwear).  Went to the ER morning of admission, VSS, pelvic ultrasound reassuring, CBC unremarkable, dishcarged home with OB-Gyn follow up.  Went home, then was found by family a few hours later, sluggish and semi-responsive by the toilet, toilet with red blood in it, brought back to the ER.  On return to the ER, Hgb 8.4 g/dL, Cr up to 1.69. Started on fluids and admitted for observation.  Noted to have SVT in the ER, found to have mag 0.9.          Assessment & Plan:  Syncope Patient arrived with syncope and apparent vaginal bleeding.  Subsequently found to have AKI and significant hypomagnesemia.     AKI Baseline Cr 0.8, here Cr was 1.3 in the morning, up to 1.7 on return.    Improved overnight with fluids -Continue IV fluids -Avoid nephrotoxins   Acute blood loss anemia, suspected Vaginal bleeding Hard to reconcile her Hgb here.  Baseline appears to be ~10 g.dL.  Initially here was 8.5 g/dL, then repeat was 5.3 g/dL, then after 2 units of blood, Hgb rose to 13 g/dL, confirmed with repeat.  I suspect 5.3 was spurious.  With respect to vaginal bleeding, this was discussed with OB-Gyn, her exam rules out cervical/vaginal pathology, her normal CT and pelvic US imply that this is a minor uterine issue which can be followed up with biopsy as an outpatient.  We expect it to be self-limited.  At present she had minimal bleeding on my exam, and only once had to change a depends due to bleeding.  -Observe active bleeding -Trend Hgb -Consult OB, appreciate cares     ADDENDUM:  Patient  persistently lethargic all day.  WBC up and not improving.  Developed low grade fever, tachycardia today, has transient hypotension today.  -Continue IV fluids, has already had >30 cc/kg -Start Rocephin -Collect blood and urine cultures      Hypomagnesemia Resolved with supplementation  Anion gap metabolic acidosis Mild, resolved with bicarb, probably from AKI.   Polymyalgia rheumatica -Continue Plaquenil and prednisone   Diabetes, type 2 well controlled with polyneuropathy -Continue SS corrections -Hold gabapentin for now       Disposition: Status is: Observation  The patient will require care spanning > 2 midnights and should be moved to inpatient because:  ongoing bleeding that already required a transfusion, also ongoing IV fluids due to AKI  Dispo: The patient is from: Home              Anticipated d/c is to: Home              Patient currently is not medically stable to d/c.   Difficult to place patient No       Level of care: Med-Surg       MDM: The below labs and imaging reports were reviewed and summarized above.  Medication management as above.    DVT prophylaxis: SCDs Start: 03/07/21 2059  Code Status: FULL Family Communication: Daughter by phone  Subjective: No vaginal bleeding at time of my visit, but nursing reported one bloody depends later.  No fever, cough.  Feels very weak and tired.  No confusion, no seizures.  Objective: Vitals:   03/08/21 0750 03/08/21 0800 03/08/21 0946 03/08/21 1201  BP: 131/60 100/67 126/66 (!) 89/55  Pulse: (!) 104 (!) 109 (!) 111 88  Resp: 20 16 18 16   Temp: 99.3 F (37.4 C)   98.2 F (36.8 C)  TempSrc: Oral     SpO2: 100% 100% 95% 100%    Intake/Output Summary (Last 24 hours) at 03/08/2021 1237 Last data filed at 03/08/2021 1100 Gross per 24 hour  Intake 1850 ml  Output 1 ml  Net 1849 ml   There were no vitals filed for this visit.  Examination: General appearance:  Elerly adult female, alert and in no acute distress.  Appears tired. HEENT: Anicteric, conjunctiva pink, lids and lashes normal. No nasal deformity, discharge, epistaxis.  OP dry.   Skin: Warm and dry.  Many many scattered ecchymoses, no jaundice.  No suspicious rashes or lesions. Cardiac: RRR, nl S1-S2, no murmurs appreciated.  Capillary refill is brisk.  JVP normal.  No LE edema.  Radial pulses 2+ and symmetric. Respiratory: Normal respiratory rate and rhythm.  CTAB without rales or wheezes. Abdomen: Abdomen soft.  No TTP or guarding. No ascites, distension, hepatosplenomegaly.   MSK: No deformities or effusions. Neuro: Awake and alert.  EOMI, moves all extremities with generalized weakness and symmetric strength. Speech fluent.    Psych: Sensorium intact and responding to questions, attention normal. Affect normal.  Judgment and insight appear normal.    Data Reviewed: I have personally reviewed following labs and imaging studies:  CBC: Recent Labs  Lab 03/07/21 1113 03/07/21 1824 03/08/21 0051 03/08/21 0526 03/08/21 0832  WBC 9.7 17.3*  --   --  17.6*  NEUTROABS 4.4 7.5  --   --   --   HGB 9.4* 8.5* 5.3* 13.7 12.5  HCT 29.6* 28.2* 16.6* 41.6 37.1  MCV 86.3 91.3  --   --  83.7  PLT 225 238  --   --  008   Basic Metabolic Panel: Recent Labs  Lab 03/07/21 1113 03/07/21 1824 03/08/21 0209 03/08/21 0832  NA 136 136  --  136  K 3.7 4.0  --  4.8  CL 103 105  --  100  CO2 27 14*  --  27  GLUCOSE 112* 215*  --  116*  BUN 20 21  --  25*  CREATININE 1.36* 1.69*  --  1.57*  CALCIUM 8.5* 8.6*  --  7.8*  MG  --   --  0.9* 2.9*   GFR: Estimated Creatinine Clearance: 21.2 mL/min (A) (by C-G formula based on SCr of 1.57 mg/dL (H)). Liver Function Tests: Recent Labs  Lab 03/07/21 1113  AST 14*  ALT 7  ALKPHOS 55  BILITOT 0.4  PROT 6.3*  ALBUMIN 3.2*   No results for input(s): LIPASE, AMYLASE in the last 168 hours. No results for input(s): AMMONIA in the last 168  hours. Coagulation Profile: No results for input(s): INR, PROTIME in the last 168 hours. Cardiac Enzymes: No results for input(s): CKTOTAL, CKMB, CKMBINDEX, TROPONINI in the last 168 hours. BNP (last 3 results) No results for input(s): PROBNP in the last 8760 hours. HbA1C: No results for input(s): HGBA1C in the last 72 hours. CBG: Recent Labs  Lab 03/08/21 0103 03/08/21 0453 03/08/21 0747 03/08/21 1223  GLUCAP 141* 108* 122*  76   Lipid Profile: No results for input(s): CHOL, HDL, LDLCALC, TRIG, CHOLHDL, LDLDIRECT in the last 72 hours. Thyroid Function Tests: No results for input(s): TSH, T4TOTAL, FREET4, T3FREE, THYROIDAB in the last 72 hours. Anemia Panel: No results for input(s): VITAMINB12, FOLATE, FERRITIN, TIBC, IRON, RETICCTPCT in the last 72 hours. Urine analysis:    Component Value Date/Time   COLORURINE Yellow 04/30/2012 1618   APPEARANCEUR Clear 07/05/2018 1446   LABSPEC 1.036 04/30/2012 1618   PHURINE 6.0 04/30/2012 1618   GLUCOSEU Negative 07/05/2018 1446   GLUCOSEU Negative 04/30/2012 1618   HGBUR Negative 04/30/2012 1618   BILIRUBINUR Negative 07/05/2018 1446   BILIRUBINUR Negative 04/30/2012 1618   KETONESUR Trace 04/30/2012 1618   PROTEINUR Trace 07/05/2018 1446   PROTEINUR 30 mg/dL 04/30/2012 1618   NITRITE Negative 07/05/2018 1446   NITRITE Negative 04/30/2012 1618   LEUKOCYTESUR 1+ (A) 07/05/2018 1446   LEUKOCYTESUR 2+ 04/30/2012 1618   Sepsis Labs: @LABRCNTIP (procalcitonin:4,lacticacidven:4)  ) Recent Results (from the past 240 hour(s))  Resp Panel by RT-PCR (Flu A&B, Covid) Nasopharyngeal Swab     Status: None   Collection Time: 03/07/21  6:24 PM   Specimen: Nasopharyngeal Swab; Nasopharyngeal(NP) swabs in vial transport medium  Result Value Ref Range Status   SARS Coronavirus 2 by RT PCR NEGATIVE NEGATIVE Final    Comment: (NOTE) SARS-CoV-2 target nucleic acids are NOT DETECTED.  The SARS-CoV-2 RNA is generally detectable in upper  respiratory specimens during the acute phase of infection. The lowest concentration of SARS-CoV-2 viral copies this assay can detect is 138 copies/mL. A negative result does not preclude SARS-Cov-2 infection and should not be used as the sole basis for treatment or other patient management decisions. A negative result may occur with  improper specimen collection/handling, submission of specimen other than nasopharyngeal swab, presence of viral mutation(s) within the areas targeted by this assay, and inadequate number of viral copies(<138 copies/mL). A negative result must be combined with clinical observations, patient history, and epidemiological information. The expected result is Negative.  Fact Sheet for Patients:  EntrepreneurPulse.com.au  Fact Sheet for Healthcare Providers:  IncredibleEmployment.be  This test is no t yet approved or cleared by the [place] FDA and  has been authorized for detection and/or diagnosis of SARS-CoV-2 by FDA under an Emergency Use Authorization (EUA). This EUA will remain  in effect (meaning this test can be used) for the duration of the COVID-19 declaration under Section 564(b)(1) of the Act, 21 U.S.C.section 360bbb-3(b)(1), unless the authorization is terminated  or revoked sooner.       Influenza A by PCR NEGATIVE NEGATIVE Final   Influenza B by PCR NEGATIVE NEGATIVE Final    Comment: (NOTE) The Xpert Xpress SARS-CoV-2/FLU/RSV plus assay is intended as an aid in the diagnosis of influenza from Nasopharyngeal swab specimens and should not be used as a sole basis for treatment. Nasal washings and aspirates are unacceptable for Xpert Xpress SARS-CoV-2/FLU/RSV testing.  Fact Sheet for Patients: EntrepreneurPulse.com.au  Fact Sheet for Healthcare Providers: IncredibleEmployment.be  This test is not yet approved or cleared by the [place] FDA and has been  authorized for detection and/or diagnosis of SARS-CoV-2 by FDA under an Emergency Use Authorization (EUA). This EUA will remain in effect (meaning this test can be used) for the duration of the COVID-19 declaration under Section 564(b)(1) of the Act, 21 U.S.C. section 360bbb-3(b)(1), unless the authorization is terminated or revoked.  Performed at [facility], [address], [place], [place] [address]  Radiology Studies: US Pelvis Complete  Result Date: 03/07/2021 CLINICAL DATA:  Vaginal bleeding EXAM: TRANSABDOMINAL ULTRASOUND OF PELVIS TECHNIQUE: Transabdominal ultrasound examination of the pelvis was performed including evaluation of the uterus, ovaries, adnexal regions, and pelvic cul-de-sac. COMPARISON:  Ultrasound 12/05/2018 FINDINGS: Uterus Measurements: 6.4 x 1.7 x 3.0 = volume: 17 mL. No fibroids or other mass visualized. Endometrium Thickness: 2.5 mm.  No focal abnormality visualized. Right ovary Measurements: Nonvisualized Left ovary Measurements: Not visualized. Other findings:  No abnormal free fluid. IMPRESSION: Unremarkable sonographic appearance of the uterus. No endometrial thickening or mass visible on transabdominal exam. Nonvisualization of the ovaries. Electronically Signed   By: Maurine Simmering   On: 03/07/2021 12:38   CT ABDOMEN PELVIS W CONTRAST  Result Date: 03/07/2021 CLINICAL DATA:  Vaginal and rectal bleeding. Patient found semi responsive. Blood in toilet. EXAM: CT ABDOMEN AND PELVIS WITH CONTRAST TECHNIQUE: Multidetector CT imaging of the abdomen and pelvis was performed using the standard protocol following bolus administration of intravenous contrast. CONTRAST:  148mL OMNIPAQUE IOHEXOL 300 MG/ML  SOLN COMPARISON:  November 06, 2012 FINDINGS: Lower chest: Small hiatal hernia. No other abnormalities in the lung bases are lower chest. Hepatobiliary: The patient is status post cholecystectomy. Mild intra and extrahepatic biliary duct dilatation is  unchanged. Portal vein is patent. Pancreas: Unremarkable. No pancreatic ductal dilatation or surrounding inflammatory changes. Spleen: Normal in size without focal abnormality. Adrenals/Urinary Tract: The adrenal glands, kidneys, and ureters are normal. The bladder is poorly distended limiting evaluation. Bladder wall thickening not excluded. Stomach/Bowel: The stomach and small bowel are normal. There is a large stool ball in the rectum without perirectal fat stranding or pneumatosis. The stool ball measures at least 7.8 x 7.4 x 6.5 cm. An anastomotic ring is identified in the region of the sigmoid colon. There is wall thickening associated with the colon. The distal half of the transverse colon and the descending colon are continuously affected. The cecum demonstrates some wall thickening and increased mucosal enhancement as seen on coronal image 29 and axial image 45 suggesting involvement of the cecum as well. There is sparing of most of the ascending colon and the proximal half of the transverse colon. There is mild pericolonic stranding in some locations, particularly adjacent to the descending colon. The appendix is not visualized but there is no secondary evidence of appendicitis. Vascular/Lymphatic: Calcified atherosclerosis is seen in the nonaneurysmal aorta, extending into the iliac and femoral vessels. No dissection. No adenopathy. Reproductive: Uterus and bilateral adnexa are unremarkable. Other: There is a ventral hernia in the pelvis containing loops of small bowel without obstruction. This finding is unchanged since 2014. There is a small ventral hernia anteriorly best seen on sagittal image 88. There is some enhancing soft tissue nodularity surrounding this hernia well seen on sagittal image 86. This finding is new since 2014. Musculoskeletal: No acute or significant osseous findings. IMPRESSION: 1. There is a large stool ball in the rectum measuring 7.8 x 7.4 x 6.5 cm without wall thickening,  pneumatosis, or adjacent fat stranding. 2. Wall thickening associated with the cecum, the distal half of the transverse colon, and the descending colon with some regions of pericolonic fat stranding. This finding is consistent with colitis which could be inflammatory, infectious, or ischemic. 3. The bladder is poorly distended limiting evaluation but nonspecific wall thickening is not excluded. 4. There is a small ventral hernia in the upper abdomen at midline containing fat. There is enhancing soft tissue nodularity surrounding this hernia not seen in  2014. This soft tissue nodularity could be inflammatory, neoplastic, or less likely infectious. 5. Calcified atherosclerosis in the nonaneurysmal aorta. Electronically Signed   By: Dorise Bullion III M.D   On: 03/07/2021 20:05   Portable Chest 1 View  Result Date: 03/07/2021 CLINICAL DATA:  Syncope. EXAM: PORTABLE CHEST 1 VIEW COMPARISON:  May 12, 2015 FINDINGS: No pneumothorax. The heart size borderline to mildly enlarged. The hila and mediastinum are unremarkable. No focal infiltrates. Mild atelectasis in the bases. No other acute abnormalities. IMPRESSION: No acute abnormalities. Electronically Signed   By: Dorise Bullion III M.D   On: 03/07/2021 21:44        Scheduled Meds:  sodium chloride   Intravenous Once   [START ON 03/09/2021] hydroxychloroquine  200 mg Oral Q48H   hydroxychloroquine  400 mg Oral Q48H   insulin aspart  0-5 Units Subcutaneous QHS   insulin aspart  0-9 Units Subcutaneous TID WC   mouth rinse  15 mL Mouth Rinse BID   [START ON 03/09/2021] predniSONE  5 mg Oral Q breakfast   sodium chloride flush  3 mL Intravenous Q12H   Continuous Infusions:  lactated ringers 100 mL/hr at 03/08/21 0941     LOS: 0 days    Time spent: 25 minutes    Edwin Dada, MD [facility] 03/08/2021, 12:37 PM     Please page though [place] or Epic secure chat:  For [employer], Adult nurse

## 2021-03-08 NOTE — ED Notes (Signed)
Informed RN bed assigned 

## 2021-03-08 NOTE — Progress Notes (Signed)
Pt just vomited up 25 cc's green bile fluid after taking a sip of water. She ate some light lunch several hours ago and hasn't eaten since then. Dr. Loleta Books notified.

## 2021-03-08 NOTE — ED Notes (Signed)
Phlebotomy contacted to collect patient labs.

## 2021-03-08 NOTE — ED Notes (Signed)
Magnesium level 0.9, critical called by lab Will inform provider

## 2021-03-08 NOTE — ED Notes (Signed)
Blood consent was obtained by family over phone at time of emergent blood administration @ 1825. No new consent obtained as of now for second unit.

## 2021-03-09 ENCOUNTER — Encounter: Payer: Self-pay | Admitting: Family Medicine

## 2021-03-09 DIAGNOSIS — Z7189 Other specified counseling: Secondary | ICD-10-CM

## 2021-03-09 DIAGNOSIS — Z515 Encounter for palliative care: Secondary | ICD-10-CM

## 2021-03-09 DIAGNOSIS — D62 Acute posthemorrhagic anemia: Secondary | ICD-10-CM | POA: Diagnosis not present

## 2021-03-09 DIAGNOSIS — K529 Noninfective gastroenteritis and colitis, unspecified: Secondary | ICD-10-CM

## 2021-03-09 LAB — BPAM RBC
Blood Product Expiration Date: 202206172359
Blood Product Expiration Date: 202207072359
Blood Product Expiration Date: 202207072359
Blood Product Expiration Date: 202207112359
ISSUE DATE / TIME: 202206121820
ISSUE DATE / TIME: 202206130229
ISSUE DATE / TIME: 202206130503
ISSUE DATE / TIME: 202206131905
Unit Type and Rh: 5100
Unit Type and Rh: 5100
Unit Type and Rh: 5100
Unit Type and Rh: 5100

## 2021-03-09 LAB — BASIC METABOLIC PANEL
Anion gap: 6 (ref 5–15)
BUN: 28 mg/dL — ABNORMAL HIGH (ref 8–23)
CO2: 29 mmol/L (ref 22–32)
Calcium: 7.6 mg/dL — ABNORMAL LOW (ref 8.9–10.3)
Chloride: 99 mmol/L (ref 98–111)
Creatinine, Ser: 1.53 mg/dL — ABNORMAL HIGH (ref 0.44–1.00)
GFR, Estimated: 32 mL/min — ABNORMAL LOW (ref >60)
Glucose, Bld: 64 mg/dL — ABNORMAL LOW (ref 70–99)
Potassium: 3.6 mmol/L (ref 3.5–5.1)
Sodium: 134 mmol/L — ABNORMAL LOW (ref 135–145)

## 2021-03-09 LAB — TYPE AND SCREEN
ABO/RH(D): O POS
Antibody Screen: NEGATIVE
Unit division: 0
Unit division: 0
Unit division: 0
Unit division: 0

## 2021-03-09 LAB — CBC
HCT: 31.8 % — ABNORMAL LOW (ref 36.0–46.0)
Hemoglobin: 10.8 g/dL — ABNORMAL LOW (ref 12.0–15.0)
MCH: 28.9 pg (ref 26.0–34.0)
MCHC: 34 g/dL (ref 30.0–36.0)
MCV: 85 fL (ref 80.0–100.0)
Platelets: 131 10*3/uL — ABNORMAL LOW (ref 150–400)
RBC: 3.74 MIL/uL — ABNORMAL LOW (ref 3.87–5.11)
RDW: 15.9 % — ABNORMAL HIGH (ref 11.5–15.5)
WBC: 12.9 10*3/uL — ABNORMAL HIGH (ref 4.0–10.5)
nRBC: 0 % (ref 0.0–0.2)

## 2021-03-09 LAB — GLUCOSE, CAPILLARY
Glucose-Capillary: 52 mg/dL — ABNORMAL LOW (ref 70–99)
Glucose-Capillary: 57 mg/dL — ABNORMAL LOW (ref 70–99)
Glucose-Capillary: 73 mg/dL (ref 70–99)
Glucose-Capillary: 67 mg/dL — ABNORMAL LOW (ref 70–99)
Glucose-Capillary: 89 mg/dL (ref 70–99)
Glucose-Capillary: 92 mg/dL (ref 70–99)

## 2021-03-09 LAB — GASTROINTESTINAL PANEL BY PCR, STOOL (REPLACES STOOL CULTURE)

## 2021-03-09 LAB — C DIFFICILE QUICK SCREEN W PCR REFLEX
C Diff antigen: NEGATIVE
C Diff interpretation: NOT DETECTED
C Diff toxin: NEGATIVE

## 2021-03-09 LAB — HEMOGLOBIN A1C
Hgb A1c MFr Bld: 5.6 % (ref 4.8–5.6)
Mean Plasma Glucose: 114 mg/dL

## 2021-03-09 LAB — LACTIC ACID, PLASMA: Lactic Acid, Venous: 2.9 mmol/L (ref 0.5–1.9)

## 2021-03-09 MED ORDER — METRONIDAZOLE 500 MG/100ML IV SOLN
500.0000 mg | Freq: Three times a day (TID) | INTRAVENOUS | Status: AC
  Administered 2021-03-09 – 2021-03-15 (×19): 500 mg via INTRAVENOUS
  Filled 2021-03-09 (×21): qty 100

## 2021-03-09 MED ORDER — DEXTROSE 5 % IV SOLN: INTRAVENOUS | Status: DC

## 2021-03-09 MED ORDER — SODIUM CHLORIDE 0.9 % IV BOLUS
1000.0000 mL | Freq: Once | INTRAVENOUS | Status: AC
  Administered 2021-03-09: 1000 mL via INTRAVENOUS

## 2021-03-09 MED ORDER — BISACODYL 10 MG RE SUPP
10.0000 mg | Freq: Once | RECTAL | Status: AC
  Administered 2021-03-10: 09:00:00 10 mg via RECTAL
  Filled 2021-03-09: qty 1

## 2021-03-09 MED ORDER — POLYETHYLENE GLYCOL 3350 17 G PO PACK
17.0000 g | PACK | Freq: Every day | ORAL | Status: DC
  Administered 2021-03-09 – 2021-03-10 (×2): 17 g via ORAL
  Filled 2021-03-09 (×2): qty 1

## 2021-03-09 MED ORDER — ENOXAPARIN SODIUM 30 MG/0.3ML IJ SOSY
30.0000 mg | PREFILLED_SYRINGE | INTRAMUSCULAR | Status: DC
  Administered 2021-03-09 – 2021-03-15 (×7): 30 mg via SUBCUTANEOUS
  Filled 2021-03-09 (×7): qty 0.3

## 2021-03-09 NOTE — Progress Notes (Signed)
PHARMACIST - PHYSICIAN COMMUNICATION  CONCERNING:  Enoxaparin (Lovenox) for DVT Prophylaxis    RECOMMENDATION: Patient was prescribed enoxaprin 40mg  q24 hours for VTE prophylaxis.   There were no vitals filed for this visit.  There is no height or weight on file to calculate BMI.  Estimated Creatinine Clearance: 21.7 mL/min (A) (by C-G formula based on SCr of 1.53 mg/dL (H)).   Patient is candidate for enoxaparin 30mg  every 24 hours based on CrCl <52ml/min or Weight <45kg  DESCRIPTION: Pharmacy has adjusted enoxaparin dose per [facility] policy.  Patient is now receiving enoxaparin 30 mg every 24 hours    Berta Minor, PharmD Clinical Pharmacist  03/09/2021 10:40 AM

## 2021-03-09 NOTE — Consult Note (Addendum)
Consultation Note Date: 03/09/2021   Patient Name: Stacy [name]  DOB: [birth year]  MRN: [id number]  Age / Sex: 87 y.o., female  PCP: [name], MD Referring Physician: Edwin Dada, *  Reason for Consultation: Establishing goals of care  HPI/Patient Profile: 87 y.o. female  with past medical history of Diabetes, hypertension, hypothyroidism, COPD, chronic respiratory failure home O2 which she uses as needed, polymyalgia rheumatica on chronic low-dose prednisone admitted on 03/07/2021 with syncope, acute kidney injury, acute blood loss anemia.   Clinical Assessment and Goals of Care: I have reviewed medical records including EPIC notes, labs and imaging, received report from RN, assessed the patient.   Stacy [name] is lying quietly in bed.  She wakes easily as I enter the room, making and somewhat keeping eye contact.  She appears somewhat frail.  She is alert and oriented, able to make her basic needs known.  There is no family at bedside at this time.  We talk about what brought her into the hospital.  She tells me that she is having issues with her stomach.  We talked about bleeding, possible need for endometrial biopsy outpatient in the future.  I share that she decides how we take care of her, what testing she does and does not want.  We talked about a few options, and I mention the possibility of surgery.  She states that she would not want surgery at her age.  I reassured her that we are continuing to take care of her, and encouraged her to see how she feels in a few more days.  She has no questions at this time.  We talked about healthcare power of attorney.  She tells me that her daughter [name] would be her healthcare surrogate.   Conference with attending who shares Stacy [name]'s colitis and her ability to recover.   Call to daughter, [name], to discuss diagnosis prognosis, GOC, EOL wishes,  disposition and options.   I introduced Palliative Medicine as specialized medical care for people living with serious illness. It focuses on providing relief from the symptoms and stress of a serious illness. The goal is to improve quality of life for both the patient and the family.  We discussed a brief life review of the patient and then focused on their current illness. The natural disease trajectory and expectations at EOL were discussed.  At this point, Ginger agrees that Stacy [name] would not do well with surgery.  She states that Stacy [name] is having a hard time even getting out to her primary care doctor's appointments.  We talk about STR, Mrs. Grosso prefers to go home, but Ginger shares they would like some STR if qualified.  I attempted to elicit values and goals of care important to the patient.  Ginger shares her weakness and feels that Stacy [name] has gone down in last few days.    Advanced directives, concepts specific to code status, were considered and discussed.  Ginger states that her mother has not discussed  CODE STATUS.  She states that she does not believe that her mother would want to continue to live in a diminished state.  She tells me that she will discuss CODE STATUS with her brother.  Palliative Care services outpatient were explained and offered. Ginger shares that her father was on palliative care last year, but did not have it long enough before he died.  She declines palliative care for Stacy [name] at this point stating "I do not want to put her on palliative care it changes the insurance and the payer".  I share that palliative does not change payer and insurance, but hospice does.  Discussed the importance of continued conversation with family and the medical providers regarding overall plan of care and treatment options, ensuring decisions are within the context of the patient's values and GOCs.    Questions and concerns were addressed.  The family was encouraged to call with  questions or concerns.  PMT will continue to support holistically.  Conference with attending and bedside nursing staff related to patient condition and needs. PMT to continue to follow.   HCPOA  NEXT OF KIN -Stacy [name] names her daughter, [name], as her healthcare surrogate. Tells me that she has 4 children, but names her daughter.     SUMMARY OF RECOMMENDATIONS   At this point full scope/full code Daughter would accept short-term rehab if qualified   Code Status/Advance Care Planning: Full code - would not want to see her suffer.  Will discuss with her brother.   Symptom Management:  Per hospitalist, no additional needs at this time.  Palliative Prophylaxis:  Frequent Pain Assessment and Oral Care  Additional Recommendations (Limitations, Scope, Preferences): Continue to treat the treatable.  Psycho-social/Spiritual:  Desire for further Chaplaincy support:no Additional Recommendations: Caregiving  Support/Resources and Education on Hospice  Prognosis:  Unable to determine, based on outcomes.  Guarded at this point due to advanced age and poor functional status  Discharge Planning:  To be determined, based on outcomes.       Primary Diagnoses: Present on Admission:  Essential hypertension  Polymyalgia rheumatica (HCC)  Hypomagnesemia  AKI (acute kidney injury) ([place])   I have reviewed the medical record, interviewed the patient and family, and examined the patient. The following aspects are pertinent.  Past Medical History:  Diagnosis Date   Arthritis 1940   Asthma    Bowel trouble    Cancer ([place]) 2006   DCIS left breast   Hypertension 1940   Incisional hernia 2014   Personal history of radiation therapy    Polymyalgia ([place])    Thyroid disorder    Vitiligo 2012   Social History   Socioeconomic History   Marital status: Married    Spouse name: Not on file   Number of children: Not on file   Years of education: Not on file   Highest education  level: Not on file  Occupational History   Not on file  Tobacco Use   Smoking status: Never   Smokeless tobacco: Never  Vaping Use   Vaping Use: Never used  Substance and Sexual Activity   Alcohol use: No   Drug use: No   Sexual activity: Not on file  Other Topics Concern   Not on file  Social History Narrative   Not on file   Social Determinants of Health   Financial Resource Strain: Not on file  Food Insecurity: Not on file  Transportation Needs: Not on file  Physical Activity: Not on  file  Stress: Not on file  Social Connections: Not on file   Family History  Problem Relation Age of Onset   Heart disease Mother    Emphysema Mother    Arthritis Father    Cancer Father    Breast cancer Neg Hx    Scheduled Meds:  sodium chloride   Intravenous Once   bisacodyl  10 mg Rectal Once   enoxaparin (LOVENOX) injection  30 mg Subcutaneous Q24H   insulin aspart  0-5 Units Subcutaneous QHS   insulin aspart  0-9 Units Subcutaneous TID WC   mouth rinse  15 mL Mouth Rinse BID   polyethylene glycol  17 g Oral Daily   predniSONE  5 mg Oral Q breakfast   sodium chloride flush  3 mL Intravenous Q12H   Continuous Infusions:  cefTRIAXone (ROCEPHIN)  IV Stopped (03/08/21 1752)   dextrose 75 mL/hr at 03/09/21 1510   metronidazole Stopped (03/09/21 1452)   PRN Meds:.acetaminophen **OR** acetaminophen, acetaminophen, HYDROcodone-acetaminophen, ondansetron **OR** ondansetron (ZOFRAN) IV, senna-docusate Medications Prior to Admission:  Prior to Admission medications   Medication Sig Start Date End Date Taking? Authorizing Provider  acetaminophen (TYLENOL) 325 MG tablet Take 650 mg by mouth every 6 (six) hours as needed for pain.   Yes [provider]  Flaxseed, Linseed, (FLAX SEED OIL PO) Take 1 tablet by mouth daily.    Yes [provider]  gabapentin (NEURONTIN) 100 MG capsule TAKE 1 CAPSULE TWICE DAILY 01/05/21  Yes [name], MD  hydroxychloroquine (PLAQUENIL)  200 MG tablet Take 1 tablet by mouth See admin instructions. Takes alternates 1 tablet at bedtime then 2 tablets at night 07/26/18  Yes [provider]  predniSONE (DELTASONE) 10 MG tablet Take 1/2 to 1 tablet po QD Patient taking differently: Take 0.5 mg by mouth daily with breakfast. Take 1/2 to 1 tablet po QD 02/28/20  Yes Boscia, Heather E, NP  traMADol (ULTRAM) 50 MG tablet Take 1 tablet po TID prn pain. 06/09/20  Yes Boscia, Heather E, NP  glucose blood (ACCU-CHEK AVIVA PLUS) test strip USE TO CHECK GLUCOSE ONCE DAILY dx e11.65 Patient not taking: No sig reported 01/31/20   Ronnell Freshwater, NP  lisinopril (PRINIVIL,ZESTRIL) 5 MG tablet Take 1 tablet (5 mg total) by mouth daily. Patient not taking: No sig reported 10/05/18   Ronnell Freshwater, NP   Allergies  Allergen Reactions   Aspirin Itching and Other (See Comments)    Dizzy/passing out   Augmentin [Amoxicillin-Pot Clavulanate]    Codeine Other (See Comments)    dizziness   Doxycycline Hyclate Other (See Comments)   Sulfamethoxazole-Trimethoprim Diarrhea   Tape Itching   Tramadol Other (See Comments)    Makes her feel dizzy and feels like passing out Pt states she can now take this medication.  (02/27/18)   Vitamin D Analogs    Azithromycin Rash and Hives   Review of Systems  Unable to perform ROS: Age   Physical Exam Vitals and nursing note reviewed.  Constitutional:      General: She is not in acute distress.    Appearance: She is ill-appearing.  HENT:     Mouth/Throat:     Mouth: Mucous membranes are moist.  Cardiovascular:     Rate and Rhythm: Normal rate.  Pulmonary:     Effort: Pulmonary effort is normal. No respiratory distress.  Abdominal:     Palpations: Abdomen is soft.     Tenderness: There is no guarding.  Skin:  General: Skin is warm and dry.  Neurological:     Mental Status: She is alert and oriented to person, place, and time.  Psychiatric:        Mood and Affect: Mood normal.         Behavior: Behavior normal.    Vital Signs: BP (!) 99/45 (BP Location: Left Arm)   Pulse 90   Temp 98.4 F (36.9 C) (Oral)   Resp 16   SpO2 100%  Pain Scale: 0-10   Pain Score: 0-No pain   SpO2: SpO2: 100 % O2 Device:SpO2: 100 % O2 Flow Rate: .O2 Flow Rate (L/min): 2 L/min  IO: Intake/output summary:  Intake/Output Summary (Last 24 hours) at 03/09/2021 1600 Last data filed at 03/09/2021 1510 Gross per 24 hour  Intake 1317.07 ml  Output 220 ml  Net 1097.07 ml    LBM: Last BM Date: 03/08/21 Baseline Weight:   Most recent weight:       Palliative Assessment/Data:   Flowsheet Rows    Flowsheet Row Most Recent Value  Intake Tab   Referral Department Hospitalist  Unit at Time of Referral Med/Surg Unit  Palliative Care Primary Diagnosis Other (Comment)  Date Notified 03/09/21  Palliative Care Type New Palliative care  Reason for referral Clarify Goals of Care  Date of Admission 03/07/21  Date first seen by Palliative Care 03/09/21  # of days Palliative referral response time 0 Day(s)  # of days IP prior to Palliative referral 2  Clinical Assessment   Palliative Performance Scale Score 30%  Pain Max last 24 hours Not able to report  Pain Min Last 24 hours Not able to report  Dyspnea Max Last 24 Hours Not able to report  Dyspnea Min Last 24 hours Not able to report  Psychosocial & Spiritual Assessment   Palliative Care Outcomes        Time In: 1500 Time Out: 1610 Time Total: 70 minutes  Greater than 50%  of this time was spent counseling and coordinating care related to the above assessment and plan.  Signed by: Drue Novel, NP   Please contact Palliative Medicine Team phone at [phone] for questions and concerns.  For individual provider: See Shea Evans

## 2021-03-09 NOTE — Progress Notes (Signed)
[facility] PROGRESS NOTE    Stacy [name]  CMK:[id number] DOB: 16-Jan-1927 DOA: 03/07/2021 PCP: [name], MD      Brief Narrative:  Stacy [name] is a 87 y.o. F with PMR on Plaquenil, prednisone, hx DCIS, HTN and well controlled asthma who presented with being found down.  Patient was in [place] until few days ago when she became weaker.  Around this time noticed what appeared to be scant vaginal bleeding (blood in underwear).  Went to the ER morning of admission, VSS, pelvic ultrasound reassuring, CBC unremarkable, dishcarged home with OB-Gyn follow up for vaginal bleeding.  Went home, then was found by family a few hours later, sluggish and semi-responsive by the toilet, toilet with red blood in it, brought back to the ER.  On return to the ER, Hgb 8.4 g/dL, Cr up to 1.69. Started on fluids and admitted for observation.  Noted to have SVT in the ER, found to have mag 0.9.          Assessment & Plan:  Syncope This was the initial presenting complaint, but appears to be just a symptoms of her chief problem, sepsis vs ischemic colitis, complicated by AKI and hypomagneseamia and SVT.        Sepsis vs ischemic colitis CT abdomen on admission showed segments of colon thickening consistent with colitis.  Also today with worsening abdominal discomfort. And after resolution of her stool ball, there was some scant blood int he bowel movement  GI pathogen panel normal, doubt infectious or inflammatory colitis.  An alternative possibility is that the patient's leuckocytosis, tachycardia, lethargy/AMS and hypotension were reflective of Sepsis from UTI, but I favor that this is colitis, likely ischemic.  - Bowel rest, IV fluids - Empiric Rocephin IV - Follow urine culture  - Consult General Surgery, appreciate expertise  - If patient's abdominal pain improves, can continue supportive cares for ischemic colitis, but if symptoms worsen, Gen Surg recommend consulting GI  for colonoscopy prior to any potential colectomy  -Consult Palliative care for goals fo care   AKI Baseline Cr 0.8, here Cr was 1.3 in the morning, up to 1.7 on return.    Slightly better today with IV fluids to 1.5 -Continue IV fluids -Avoid nephrotoxins   Acute blood loss anemia, suspected vaginal bleeding Hard to reconcile her Hgb here.  Baseline appears to be ~10 g.dL.  Initially here was 8.5 g/dL, then repeat was 5.3 g/dL, then after 2 units of blood, Hgb rose to 13 g/dL, confirmed with repeat.  Note: Given that sequence, I SUSPECT HGB 5.3 SPURIOUS  With respect to vaginal bleeding, this was discussed with OB-Gyn, her exam rules out cervical/vaginal pathology, her normal CT and pelvic US imply that this is a minor uterine issue which can be followed up with biopsy as an outpatient.  We expect it to be self-limited.   - Consult OB-Gyn, apprecaite cares - Follow up with OB-Gyn as outpatient     Polymyalgia rheumatica - Hold Plaquenil for now given concern for infection - Continue prednisone  Diabetes, type 2 well controlled with polyneuropathy Hypoglycemia this morning - Start D5W - Hold gabapentin for now - Continue sliding scale corrections          Resolved issues: Hypomagnesemia Resolved with supplementation  Anion gap metabolic acidosis Mild, resolved with bicarb, probably from AKI.   Disposition: Status is: Observation  The patient will require care spanning > 2 midnights and should be moved to inpatient because:  ongoing bleeding that already required a transfusion, also ongoing IV fluids due to AKI  Dispo: The patient is from: Home              Anticipated d/c is to: Home              Patient currently is not medically stable to d/c.   Difficult to place patient No       Level of care: Med-Surg       MDM: The below labs and imaging reports were reviewed and summarized above.  Medication management as above.    DVT prophylaxis:  enoxaparin (LOVENOX) injection 30 mg Start: 03/09/21 2200  Code Status: FULL Family Communication: Daughter by phone              Subjective: No fever, cough, she is extremely weak, debilitated, hardly able to get up.  No new confusion, vomiting.  Had a large bowel movement today after suppository, this had some scant blood after it.  No reports of vaginal bleeding from nursing.  Objective: Vitals:   03/09/21 0646 03/09/21 0728 03/09/21 1102 03/09/21 1516  BP: 100/68 (!) 106/56 (!) 107/57 (!) 99/45  Pulse: 89 86 96 90  Resp: 18 (!) 21 18 16   Temp: 99.4 F (37.4 C) 98.4 F (36.9 C) 99.1 F (37.3 C) 98.4 F (36.9 C)  TempSrc: Oral Oral  Oral  SpO2: 100% 99% 98% 100%    Intake/Output Summary (Last 24 hours) at 03/09/2021 1645 Last data filed at 03/09/2021 1510 Gross per 24 hour  Intake 1317.07 ml  Output 220 ml  Net 1097.07 ml   There were no vitals filed for this visit.  Examination: General appearance: Adult female, lying bed, no acute distress, appears extremely tired and debilitated HEENT: Anicteric, conjunctival pink, lids and lashes normal.  No nasal forming, discharge, or epistaxis oropharynx dry, partially edentulous Skin: Innumerable scattered ecchymoses, no suspicious rashes or lesions Cardiac: Regular rate and rhythm, no murmurs that I can tell, cap refill normal, no lower extremity edema c. Respiratory: Normal respiratory rate and rhythm, lungs clear without rales or wheezes Abdomen: Abdomen soft, moderate tenderness palpation in the left lower quadrant, no rigidity, no guarding MSK: No deformities or effusions of the large joints of the upper or lower extremities bilateral Neuro: Alert, extraocular movements intact, moves all extremities with severe generalized weakness, symmetric strength, speech fluent Psych: Responds quickly to questions, attention seems normal, affect blunted, judgment and insight appear normal    Data Reviewed: I have personally  reviewed following labs and imaging studies:  CBC: Recent Labs  Lab 03/07/21 1113 03/07/21 1824 03/08/21 0051 03/08/21 0526 03/08/21 0832 03/09/21 0524  WBC 9.7 17.3*  --   --  17.6* 12.9*  NEUTROABS 4.4 7.5  --   --   --   --   HGB 9.4* 8.5* 5.3* 13.7 12.5 10.8*  HCT 29.6* 28.2* 16.6* 41.6 37.1 31.8*  MCV 86.3 91.3  --   --  83.7 85.0  PLT 225 238  --   --  153 027*   Basic Metabolic Panel: Recent Labs  Lab 03/07/21 1113 03/07/21 1824 03/08/21 0209 03/08/21 0832 03/09/21 0524  NA 136 136  --  136 134*  K 3.7 4.0  --  4.8 3.6  CL 103 105  --  100 99  CO2 27 14*  --  27 29  GLUCOSE 112* 215*  --  116* 64*  BUN 20 21  --  25* 28*  CREATININE  1.36* 1.69*  --  1.57* 1.53*  CALCIUM 8.5* 8.6*  --  7.8* 7.6*  MG  --   --  0.9* 2.9*  --    GFR: Estimated Creatinine Clearance: 21.7 mL/min (A) (by C-G formula based on SCr of 1.53 mg/dL (H)). Liver Function Tests: Recent Labs  Lab 03/07/21 1113  AST 14*  ALT 7  ALKPHOS 55  BILITOT 0.4  PROT 6.3*  ALBUMIN 3.2*   No results for input(s): LIPASE, AMYLASE in the last 168 hours. No results for input(s): AMMONIA in the last 168 hours. Coagulation Profile: No results for input(s): INR, PROTIME in the last 168 hours. Cardiac Enzymes: No results for input(s): CKTOTAL, CKMB, CKMBINDEX, TROPONINI in the last 168 hours. BNP (last 3 results) No results for input(s): PROBNP in the last 8760 hours. HbA1C: Recent Labs    03/07/21 1824  HGBA1C 5.6   CBG: Recent Labs  Lab 03/09/21 0729 03/09/21 0754 03/09/21 0840 03/09/21 1102 03/09/21 1613  GLUCAP 52* 57* 73 67* 89   Lipid Profile: No results for input(s): CHOL, HDL, LDLCALC, TRIG, CHOLHDL, LDLDIRECT in the last 72 hours. Thyroid Function Tests: No results for input(s): TSH, T4TOTAL, FREET4, T3FREE, THYROIDAB in the last 72 hours. Anemia Panel: No results for input(s): VITAMINB12, FOLATE, FERRITIN, TIBC, IRON, RETICCTPCT in the last 72 hours. Urine analysis:     Component Value Date/Time   COLORURINE AMBER (A) 03/08/2021 1613   APPEARANCEUR HAZY (A) 03/08/2021 1613   APPEARANCEUR Clear 07/05/2018 1446   LABSPEC 1.042 (H) 03/08/2021 1613   LABSPEC 1.036 04/30/2012 1618   PHURINE 5.0 03/08/2021 1613   GLUCOSEU NEGATIVE 03/08/2021 1613   GLUCOSEU Negative 04/30/2012 1618   HGBUR NEGATIVE 03/08/2021 1613   BILIRUBINUR NEGATIVE 03/08/2021 1613   BILIRUBINUR Negative 07/05/2018 1446   BILIRUBINUR Negative 04/30/2012 1618   KETONESUR 5 (A) 03/08/2021 1613   PROTEINUR NEGATIVE 03/08/2021 1613   NITRITE NEGATIVE 03/08/2021 1613   LEUKOCYTESUR MODERATE (A) 03/08/2021 1613   LEUKOCYTESUR 2+ 04/30/2012 1618   Sepsis Labs: @LABRCNTIP (procalcitonin:4,lacticacidven:4)  ) Recent Results (from the past 240 hour(s))  Resp Panel by RT-PCR (Flu A&B, Covid) Nasopharyngeal Swab     Status: None   Collection Time: 03/07/21  6:24 PM   Specimen: Nasopharyngeal Swab; Nasopharyngeal(NP) swabs in vial transport medium  Result Value Ref Range Status   SARS Coronavirus 2 by RT PCR NEGATIVE NEGATIVE Final    Comment: (NOTE) SARS-CoV-2 target nucleic acids are NOT DETECTED.  The SARS-CoV-2 RNA is generally detectable in upper respiratory specimens during the acute phase of infection. The lowest concentration of SARS-CoV-2 viral copies this assay can detect is 138 copies/mL. A negative result does not preclude SARS-Cov-2 infection and should not be used as the sole basis for treatment or other patient management decisions. A negative result may occur with  improper specimen collection/handling, submission of specimen other than nasopharyngeal swab, presence of viral mutation(s) within the areas targeted by this assay, and inadequate number of viral copies(<138 copies/mL). A negative result must be combined with clinical observations, patient history, and epidemiological information. The expected result is Negative.  Fact Sheet for Patients:   EntrepreneurPulse.com.au  Fact Sheet for Healthcare Providers:  IncredibleEmployment.be  This test is no t yet approved or cleared by the [place] FDA and  has been authorized for detection and/or diagnosis of SARS-CoV-2 by FDA under an Emergency Use Authorization (EUA). This EUA will remain  in effect (meaning this test can be used) for the duration of the COVID-19  declaration under Section 564(b)(1) of the Act, 21 U.S.C.section 360bbb-3(b)(1), unless the authorization is terminated  or revoked sooner.       Influenza A by PCR NEGATIVE NEGATIVE Final   Influenza B by PCR NEGATIVE NEGATIVE Final    Comment: (NOTE) The Xpert Xpress SARS-CoV-2/FLU/RSV plus assay is intended as an aid in the diagnosis of influenza from Nasopharyngeal swab specimens and should not be used as a sole basis for treatment. Nasal washings and aspirates are unacceptable for Xpert Xpress SARS-CoV-2/FLU/RSV testing.  Fact Sheet for Patients: EntrepreneurPulse.com.au  Fact Sheet for Healthcare Providers: IncredibleEmployment.be  This test is not yet approved or cleared by the [place] FDA and has been authorized for detection and/or diagnosis of SARS-CoV-2 by FDA under an Emergency Use Authorization (EUA). This EUA will remain in effect (meaning this test can be used) for the duration of the COVID-19 declaration under Section 564(b)(1) of the Act, 21 U.S.C. section 360bbb-3(b)(1), unless the authorization is terminated or revoked.  Performed at [facility], [address] [place], [place] [address]   CULTURE, BLOOD (ROUTINE X 2) w Reflex to ID Panel     Status: None (Preliminary result)   Collection Time: 03/08/21  4:26 PM   Specimen: BLOOD  Result Value Ref Range Status   Specimen Description BLOOD LEFT ANTECUBITAL  Final   Special Requests   Final    BOTTLES DRAWN AEROBIC AND ANAEROBIC Blood Culture  results may not be optimal due to an excessive volume of blood received in culture bottles   Culture   Final    NO GROWTH < 24 HOURS Performed at [facility], [address] [place], [place] [address]    Report Status PENDING  Incomplete  CULTURE, BLOOD (ROUTINE X 2) w Reflex to ID Panel     Status: None (Preliminary result)   Collection Time: 03/08/21  4:33 PM   Specimen: BLOOD  Result Value Ref Range Status   Specimen Description BLOOD BLOOD LEFT FOREARM  Final   Special Requests   Final    BOTTLES DRAWN AEROBIC ONLY Blood Culture results may not be optimal due to an inadequate volume of blood received in culture bottles   Culture   Final    NO GROWTH < 24 HOURS Performed at [facility], [address] [place], [place] [address]    Report Status PENDING  Incomplete  Gastrointestinal Panel by PCR , Stool     Status: None   Collection Time: 03/09/21  1:57 PM   Specimen: Stool  Result Value Ref Range Status   Campylobacter species NOT DETECTED NOT DETECTED Final   Plesimonas shigelloides NOT DETECTED NOT DETECTED Final   Salmonella species NOT DETECTED NOT DETECTED Final   Yersinia enterocolitica NOT DETECTED NOT DETECTED Final   Vibrio species NOT DETECTED NOT DETECTED Final   Vibrio cholerae NOT DETECTED NOT DETECTED Final   Enteroaggregative E coli (EAEC) NOT DETECTED NOT DETECTED Final   Enteropathogenic E coli (EPEC) NOT DETECTED NOT DETECTED Final   Enterotoxigenic E coli (ETEC) NOT DETECTED NOT DETECTED Final   Shiga like toxin producing E coli (STEC) NOT DETECTED NOT DETECTED Final   Shigella/Enteroinvasive E coli (EIEC) NOT DETECTED NOT DETECTED Final   Cryptosporidium NOT DETECTED NOT DETECTED Final   Cyclospora cayetanensis NOT DETECTED NOT DETECTED Final   Entamoeba histolytica NOT DETECTED NOT DETECTED Final   Giardia lamblia NOT DETECTED NOT DETECTED Final   Adenovirus F40/41 NOT DETECTED NOT DETECTED Final   Astrovirus  NOT DETECTED NOT  DETECTED Final   Norovirus GI/GII NOT DETECTED NOT DETECTED Final   Rotavirus A NOT DETECTED NOT DETECTED Final   Sapovirus (I, II, IV, and V) NOT DETECTED NOT DETECTED Final    Comment: Performed at [facility], [place], [place] [address]  C Difficile Quick Screen w PCR reflex     Status: None   Collection Time: 03/09/21  1:57 PM   Specimen: Stool  Result Value Ref Range Status   C Diff antigen NEGATIVE NEGATIVE Final   C Diff toxin NEGATIVE NEGATIVE Final   C Diff interpretation No C. difficile detected.  Final    Comment: Performed at [facility], [address], [place], [place] [address]         Radiology Studies: CT ABDOMEN PELVIS W CONTRAST  Result Date: 03/07/2021 CLINICAL DATA:  Vaginal and rectal bleeding. Patient found semi responsive. Blood in toilet. EXAM: CT ABDOMEN AND PELVIS WITH CONTRAST TECHNIQUE: Multidetector CT imaging of the abdomen and pelvis was performed using the standard protocol following bolus administration of intravenous contrast. CONTRAST:  115mL OMNIPAQUE IOHEXOL 300 MG/ML  SOLN COMPARISON:  November 06, 2012 FINDINGS: Lower chest: Small hiatal hernia. No other abnormalities in the lung bases are lower chest. Hepatobiliary: The patient is status post cholecystectomy. Mild intra and extrahepatic biliary duct dilatation is unchanged. Portal vein is patent. Pancreas: Unremarkable. No pancreatic ductal dilatation or surrounding inflammatory changes. Spleen: Normal in size without focal abnormality. Adrenals/Urinary Tract: The adrenal glands, kidneys, and ureters are normal. The bladder is poorly distended limiting evaluation. Bladder wall thickening not excluded. Stomach/Bowel: The stomach and small bowel are normal. There is a large stool ball in the rectum without perirectal fat stranding or pneumatosis. The stool ball measures at least 7.8 x 7.4 x 6.5 cm. An anastomotic ring is identified in the region of the sigmoid colon.  There is wall thickening associated with the colon. The distal half of the transverse colon and the descending colon are continuously affected. The cecum demonstrates some wall thickening and increased mucosal enhancement as seen on coronal image 29 and axial image 45 suggesting involvement of the cecum as well. There is sparing of most of the ascending colon and the proximal half of the transverse colon. There is mild pericolonic stranding in some locations, particularly adjacent to the descending colon. The appendix is not visualized but there is no secondary evidence of appendicitis. Vascular/Lymphatic: Calcified atherosclerosis is seen in the nonaneurysmal aorta, extending into the iliac and femoral vessels. No dissection. No adenopathy. Reproductive: Uterus and bilateral adnexa are unremarkable. Other: There is a ventral hernia in the pelvis containing loops of small bowel without obstruction. This finding is unchanged since 2014. There is a small ventral hernia anteriorly best seen on sagittal image 88. There is some enhancing soft tissue nodularity surrounding this hernia well seen on sagittal image 86. This finding is new since 2014. Musculoskeletal: No acute or significant osseous findings. IMPRESSION: 1. There is a large stool ball in the rectum measuring 7.8 x 7.4 x 6.5 cm without wall thickening, pneumatosis, or adjacent fat stranding. 2. Wall thickening associated with the cecum, the distal half of the transverse colon, and the descending colon with some regions of pericolonic fat stranding. This finding is consistent with colitis which could be inflammatory, infectious, or ischemic. 3. The bladder is poorly distended limiting evaluation but nonspecific wall thickening is not excluded. 4. There is a small ventral hernia in the upper abdomen  at midline containing fat. There is enhancing soft tissue nodularity surrounding this hernia not seen in 2014. This soft tissue nodularity could be inflammatory,  neoplastic, or less likely infectious. 5. Calcified atherosclerosis in the nonaneurysmal aorta. Electronically Signed   By: Dorise Bullion III M.D   On: 03/07/2021 20:05   Portable Chest 1 View  Result Date: 03/07/2021 CLINICAL DATA:  Syncope. EXAM: PORTABLE CHEST 1 VIEW COMPARISON:  May 12, 2015 FINDINGS: No pneumothorax. The heart size borderline to mildly enlarged. The hila and mediastinum are unremarkable. No focal infiltrates. Mild atelectasis in the bases. No other acute abnormalities. IMPRESSION: No acute abnormalities. Electronically Signed   By: Dorise Bullion III M.D   On: 03/07/2021 21:44        Scheduled Meds:  sodium chloride   Intravenous Once   bisacodyl  10 mg Rectal Once   enoxaparin (LOVENOX) injection  30 mg Subcutaneous Q24H   insulin aspart  0-5 Units Subcutaneous QHS   insulin aspart  0-9 Units Subcutaneous TID WC   mouth rinse  15 mL Mouth Rinse BID   polyethylene glycol  17 g Oral Daily   predniSONE  5 mg Oral Q breakfast   sodium chloride flush  3 mL Intravenous Q12H   Continuous Infusions:  cefTRIAXone (ROCEPHIN)  IV 1 g (03/09/21 1637)   dextrose 75 mL/hr at 03/09/21 1510   metronidazole Stopped (03/09/21 1452)     LOS: 1 day    Time spent: 35  minutes    Edwin Dada, MD [facility] 03/09/2021, 4:45 PM     Please page though [facility] or Epic secure chat:  For [employer], Adult nurse

## 2021-03-09 NOTE — Progress Notes (Signed)
Subjective:  She is resting in bed. She reports feeling cold and having abdominal pain and having pain in both of her legs. She has not been able to eat and has been feeling nauseous. She was found to be hypoglycemic this am. She was given OJ which improved her blood glucose to the 70s, however she has since had two episodes of emesis, one in our teams presence this morning. Small amount (about 25 cc) of yellow vomit with black sediment. She is wearing a wicking pad for urinary collection with orange colored urine collected int he wall cannister. Nursing reports nor further episodes of observed vaginal bleeding. They report that she had a non-bloody liquid bowel movement this morning.  She has not been noted to have rectal bleeding. She has not received any enemas this admission.  She has received 3 units of PRBC this admission.   Objective:   Blood pressure (!) 106/56, pulse 86, temperature 98.4 F (36.9 C), temperature source Oral, resp. rate (!) 21, SpO2 99 %.  General: NAD Pulmonary: no increased work of breathing, on O2 Abdomen: diffuse tenderness with + rebound tenderness in the left lower quadrant.  Extremities: no edema, no erythema, + tenderness, no signs of DVT  Results for orders placed or performed during the hospital encounter of 03/07/21 (from the past 72 hour(s))  Prepare RBC (crossmatch)     Status: None   Collection Time: 03/07/21  6:20 PM  Result Value Ref Range   Order Confirmation      ORDER PROCESSED BY BLOOD BANK Performed at [facility], [address] [place], [place] [address]   CBC with Differential     Status: Abnormal   Collection Time: 03/07/21  6:24 PM  Result Value Ref Range   WBC 17.3 (H) 4.0 - 10.5 K/uL   RBC 3.09 (L) 3.87 - 5.11 MIL/uL   Hemoglobin 8.5 (L) 12.0 - 15.0 g/dL   HCT 28.2 (L) 36.0 - 46.0 %   MCV 91.3 80.0 - 100.0 fL   MCH 27.5 26.0 - 34.0 pg   MCHC 30.1 30.0 - 36.0 g/dL   RDW 15.6 (H) 11.5 - 15.5 %   Platelets 238 150 - 400  K/uL   nRBC 0.0 0.0 - 0.2 %   Neutrophils Relative % 43 %   Neutro Abs 7.5 1.7 - 7.7 K/uL   Lymphocytes Relative 41 %   Lymphs Abs 7.0 (H) 0.7 - 4.0 K/uL   Monocytes Relative 9 %   Monocytes Absolute 1.6 (H) 0.1 - 1.0 K/uL   Eosinophils Relative 4 %   Eosinophils Absolute 0.6 (H) 0.0 - 0.5 K/uL   Basophils Relative 1 %   Basophils Absolute 0.1 0.0 - 0.1 K/uL   Immature Granulocytes 2 %   Abs Immature Granulocytes 0.30 (H) 0.00 - 0.07 K/uL    Comment: Performed at [facility], [address], [place], [place] [address]  Basic metabolic panel     Status: Abnormal   Collection Time: 03/07/21  6:24 PM  Result Value Ref Range   Sodium 136 135 - 145 mmol/L   Potassium 4.0 3.5 - 5.1 mmol/L   Chloride 105 98 - 111 mmol/L   CO2 14 (L) 22 - 32 mmol/L   Glucose, Bld 215 (H) 70 - 99 mg/dL    Comment: Glucose reference range applies only to samples taken after fasting for at least 8 hours.   BUN 21 8 - 23 mg/dL   Creatinine, Ser 1.69 (H) 0.44 -  1.00 mg/dL   Calcium 8.6 (L) 8.9 - 10.3 mg/dL   GFR, Estimated 28 (L) >60 mL/min    Comment: (NOTE) Calculated using the CKD-EPI Creatinine Equation (2021)    Anion gap 17 (H) 5 - 15    Comment: Performed at [facility], [address] [place], [place] [address]  Resp Panel by RT-PCR (Flu A&B, Covid) Nasopharyngeal Swab     Status: None   Collection Time: 03/07/21  6:24 PM   Specimen: Nasopharyngeal Swab; Nasopharyngeal(NP) swabs in vial transport medium  Result Value Ref Range   SARS Coronavirus 2 by RT PCR NEGATIVE NEGATIVE    Comment: (NOTE) SARS-CoV-2 target nucleic acids are NOT DETECTED.  The SARS-CoV-2 RNA is generally detectable in upper respiratory specimens during the acute phase of infection. The lowest concentration of SARS-CoV-2 viral copies this assay can detect is 138 copies/mL. A negative result does not preclude SARS-Cov-2 infection and should not be used as the sole basis for treatment or other patient  management decisions. A negative result may occur with  improper specimen collection/handling, submission of specimen other than nasopharyngeal swab, presence of viral mutation(s) within the areas targeted by this assay, and inadequate number of viral copies(<138 copies/mL). A negative result must be combined with clinical observations, patient history, and epidemiological information. The expected result is Negative.  Fact Sheet for Patients:  EntrepreneurPulse.com.au  Fact Sheet for Healthcare Providers:  IncredibleEmployment.be  This test is no t yet approved or cleared by the [place] FDA and  has been authorized for detection and/or diagnosis of SARS-CoV-2 by FDA under an Emergency Use Authorization (EUA). This EUA will remain  in effect (meaning this test can be used) for the duration of the COVID-19 declaration under Section 564(b)(1) of the Act, 21 U.S.C.section 360bbb-3(b)(1), unless the authorization is terminated  or revoked sooner.       Influenza A by PCR NEGATIVE NEGATIVE   Influenza B by PCR NEGATIVE NEGATIVE    Comment: (NOTE) The Xpert Xpress SARS-CoV-2/FLU/RSV plus assay is intended as an aid in the diagnosis of influenza from Nasopharyngeal swab specimens and should not be used as a sole basis for treatment. Nasal washings and aspirates are unacceptable for Xpert Xpress SARS-CoV-2/FLU/RSV testing.  Fact Sheet for Patients: EntrepreneurPulse.com.au  Fact Sheet for Healthcare Providers: IncredibleEmployment.be  This test is not yet approved or cleared by the [place] FDA and has been authorized for detection and/or diagnosis of SARS-CoV-2 by FDA under an Emergency Use Authorization (EUA). This EUA will remain in effect (meaning this test can be used) for the duration of the COVID-19 declaration under Section 564(b)(1) of the Act, 21 U.S.C. section 360bbb-3(b)(1), unless the  authorization is terminated or revoked.  Performed at [facility], [address] [place], [place] [address]   Type and screen Ordered by PROVIDER DEFAULT     Status: None (Preliminary result)   Collection Time: 03/07/21  8:22 PM  Result Value Ref Range   ABO/RH(D) O POS    Antibody Screen NEG    Sample Expiration 03/10/2021,2359    Unit Number [id number]    Blood Component Type RED CELLS,LR    Unit division 00    Status of Unit ISSUED    Transfusion Status OK TO TRANSFUSE    Crossmatch Result Compatible    Unit Number [id number]    Blood Component Type RED CELLS,LR    Unit division 00    Status of Unit ISSUED    Transfusion Status OK TO  TRANSFUSE    Crossmatch Result Compatible    Unit Number [id number]    Blood Component Type RED CELLS,LR    Unit division 00    Status of Unit ISSUED    Transfusion Status OK TO TRANSFUSE    Crossmatch Result Compatible    Unit Number [id number]    Blood Component Type RED CELLS,LR    Unit division 00    Status of Unit REL FROM [facility]    Transfusion Status OK TO TRANSFUSE    Crossmatch Result      Compatible Performed at [facility], [address] [place], [place] [address]   Hemoglobin and hematocrit, blood     Status: Abnormal   Collection Time: 03/08/21 12:51 AM  Result Value Ref Range   Hemoglobin 5.3 (L) 12.0 - 15.0 g/dL    Comment: REPEATED TO VERIFY   HCT 16.6 (L) 36.0 - 46.0 %    Comment: Performed at [facility], [address] [place], [place] [address]  CBG monitoring, ED     Status: Abnormal   Collection Time: 03/08/21  1:03 AM  Result Value Ref Range   Glucose-Capillary 141 (H) 70 - 99 mg/dL    Comment: Glucose reference range applies only to samples taken after fasting for at least 8 hours.  Magnesium     Status: Abnormal   Collection Time: 03/08/21  2:09 AM  Result Value Ref Range   Magnesium 0.9 (LL) 1.7 - 2.4 mg/dL    Comment: CRITICAL RESULT CALLED TO, READ BACK  BY AND VERIFIED WITH  SARA SCHIFFELBEIN 03/08/21 0259 ADL Performed at [facility], [address] [place], [place] [address]   Prepare RBC (crossmatch)     Status: None   Collection Time: 03/08/21  2:19 AM  Result Value Ref Range   Order Confirmation      ORDER PROCESSED BY BLOOD BANK Performed at [facility], [address] [place], [place] [address]   CBG monitoring, ED     Status: Abnormal   Collection Time: 03/08/21  4:53 AM  Result Value Ref Range   Glucose-Capillary 108 (H) 70 - 99 mg/dL    Comment: Glucose reference range applies only to samples taken after fasting for at least 8 hours.  Hemoglobin and hematocrit, blood     Status: None   Collection Time: 03/08/21  5:26 AM  Result Value Ref Range   Hemoglobin 13.7 12.0 - 15.0 g/dL    Comment: POST TRANSFUSION SPECIMEN   HCT 41.6 36.0 - 46.0 %    Comment: Performed at [facility], [address] [place], [place] [address]  CBG monitoring, ED     Status: Abnormal   Collection Time: 03/08/21  7:47 AM  Result Value Ref Range   Glucose-Capillary 122 (H) 70 - 99 mg/dL    Comment: Glucose reference range applies only to samples taken after fasting for at least 8 hours.  Basic metabolic panel     Status: Abnormal   Collection Time: 03/08/21  8:32 AM  Result Value Ref Range   Sodium 136 135 - 145 mmol/L   Potassium 4.8 3.5 - 5.1 mmol/L   Chloride 100 98 - 111 mmol/L   CO2 27 22 - 32 mmol/L   Glucose, Bld 116 (H) 70 - 99 mg/dL    Comment: Glucose reference range applies only to samples taken after fasting for at least 8 hours.   BUN 25 (H) 8 - 23 mg/dL   Creatinine, Ser  1.57 (H) 0.44 - 1.00 mg/dL   Calcium 7.8 (L) 8.9 - 10.3 mg/dL   GFR, Estimated 31 (L) >60 mL/min    Comment: (NOTE) Calculated using the CKD-EPI Creatinine Equation (2021)    Anion gap 9 5 - 15    Comment: Performed at [facility], [address] [place], [place] [address]  Magnesium     Status: Abnormal    Collection Time: 03/08/21  8:32 AM  Result Value Ref Range   Magnesium 2.9 (H) 1.7 - 2.4 mg/dL    Comment: Performed at [facility], [address] [place], [place] [address]  CBC     Status: Abnormal   Collection Time: 03/08/21  8:32 AM  Result Value Ref Range   WBC 17.6 (H) 4.0 - 10.5 K/uL   RBC 4.43 3.87 - 5.11 MIL/uL   Hemoglobin 12.5 12.0 - 15.0 g/dL    Comment: REPEATED TO VERIFY   HCT 37.1 36.0 - 46.0 %   MCV 83.7 80.0 - 100.0 fL    Comment: REPEATED TO VERIFY   MCH 28.2 26.0 - 34.0 pg   MCHC 33.7 30.0 - 36.0 g/dL   RDW 14.9 11.5 - 15.5 %   Platelets 153 150 - 400 K/uL   nRBC 0.0 0.0 - 0.2 %    Comment: Performed at [facility], [address] [place], [place] [address]  Glucose, capillary     Status: None   Collection Time: 03/08/21 12:23 PM  Result Value Ref Range   Glucose-Capillary 76 70 - 99 mg/dL    Comment: Glucose reference range applies only to samples taken after fasting for at least 8 hours.  Urinalysis, Routine w reflex microscopic Urine, Catheterized     Status: Abnormal   Collection Time: 03/08/21  4:13 PM  Result Value Ref Range   Color, Urine AMBER (A) YELLOW    Comment: BIOCHEMICALS MAY BE AFFECTED BY COLOR   APPearance HAZY (A) CLEAR   Specific Gravity, Urine 1.042 (H) 1.005 - 1.030   pH 5.0 5.0 - 8.0   Glucose, UA NEGATIVE NEGATIVE mg/dL   Hgb urine dipstick NEGATIVE NEGATIVE   Bilirubin Urine NEGATIVE NEGATIVE   Ketones, ur 5 (A) NEGATIVE mg/dL   Protein, ur NEGATIVE NEGATIVE mg/dL   Nitrite NEGATIVE NEGATIVE   Leukocytes,Ua MODERATE (A) NEGATIVE   RBC / HPF 6-10 0 - 5 RBC/hpf   WBC, UA 11-20 0 - 5 WBC/hpf   Bacteria, UA RARE (A) NONE SEEN   Squamous Epithelial / LPF 0-5 0 - 5   Mucus PRESENT     Comment: Performed at [facility], [address] [place], [place] [address]  Glucose, capillary     Status: None   Collection Time: 03/08/21  4:13 PM  Result Value Ref Range   Glucose-Capillary 88 70 - 99 mg/dL     Comment: Glucose reference range applies only to samples taken after fasting for at least 8 hours.  CULTURE, BLOOD (ROUTINE X 2) w Reflex to ID Panel     Status: None (Preliminary result)   Collection Time: 03/08/21  4:26 PM   Specimen: BLOOD  Result Value Ref Range   Specimen Description BLOOD LEFT ANTECUBITAL    Special Requests      BOTTLES DRAWN AEROBIC AND ANAEROBIC Blood Culture results may not be optimal due to an excessive volume of blood received in culture bottles   Culture      NO GROWTH < 24 HOURS Performed at [facility], [address], [place], [place] [address]    Report Status PENDING   Lactic acid, plasma     Status: Abnormal   Collection Time: 03/08/21  4:27 PM  Result Value Ref Range   Lactic Acid, Venous 2.9 (HH) 0.5 - 1.9 mmol/L    Comment: CRITICAL RESULT CALLED TO, READ BACK BY AND VERIFIED WITH JOY BEEMAN @6 /13/22 MJU Performed at [facility], [address] [place], [place] [address]   CULTURE, BLOOD (ROUTINE X 2) w Reflex to ID Panel     Status: None (Preliminary result)   Collection Time: 03/08/21  4:33 PM   Specimen: BLOOD  Result Value Ref Range   Specimen Description BLOOD BLOOD LEFT FOREARM    Special Requests      BOTTLES DRAWN AEROBIC ONLY Blood Culture results may not be optimal due to an inadequate volume of blood received in culture bottles   Culture      NO GROWTH < 24 HOURS Performed at [facility], [address] [place], [place] [address]    Report Status PENDING   Lactic acid, plasma     Status: Abnormal   Collection Time: 03/08/21  7:04 PM  Result Value Ref Range   Lactic Acid, Venous 2.0 (HH) 0.5 - 1.9 mmol/L    Comment: CRITICAL VALUE NOTED. VALUE IS CONSISTENT WITH PREVIOUSLY REPORTED/CALLED VALUE MJU Performed at [facility], [address] [place], [place] [address]   Glucose, capillary     Status: None   Collection Time: 03/08/21  9:37 PM  Result Value Ref Range    Glucose-Capillary 72 70 - 99 mg/dL    Comment: Glucose reference range applies only to samples taken after fasting for at least 8 hours.  CBC     Status: Abnormal   Collection Time: 03/09/21  5:24 AM  Result Value Ref Range   WBC 12.9 (H) 4.0 - 10.5 K/uL   RBC 3.74 (L) 3.87 - 5.11 MIL/uL   Hemoglobin 10.8 (L) 12.0 - 15.0 g/dL   HCT 31.8 (L) 36.0 - 46.0 %   MCV 85.0 80.0 - 100.0 fL   MCH 28.9 26.0 - 34.0 pg   MCHC 34.0 30.0 - 36.0 g/dL   RDW 15.9 (H) 11.5 - 15.5 %   Platelets 131 (L) 150 - 400 K/uL   nRBC 0.0 0.0 - 0.2 %    Comment: Performed at [facility], [address], [place], [place] [address]  Basic metabolic panel     Status: Abnormal   Collection Time: 03/09/21  5:24 AM  Result Value Ref Range   Sodium 134 (L) 135 - 145 mmol/L   Potassium 3.6 3.5 - 5.1 mmol/L   Chloride 99 98 - 111 mmol/L   CO2 29 22 - 32 mmol/L   Glucose, Bld 64 (L) 70 - 99 mg/dL    Comment: Glucose reference range applies only to samples taken after fasting for at least 8 hours.   BUN 28 (H) 8 - 23 mg/dL   Creatinine, Ser 1.53 (H) 0.44 - 1.00 mg/dL   Calcium 7.6 (L) 8.9 - 10.3 mg/dL   GFR, Estimated 32 (L) >60 mL/min    Comment: (NOTE) Calculated using the CKD-EPI Creatinine Equation (2021)    Anion gap 6 5 - 15    Comment: Performed at [facility], [address] [place], [place] [address]  Glucose, capillary     Status: Abnormal   Collection Time: 03/09/21  7:29 AM  Result Value Ref Range   Glucose-Capillary 52 (L) 70 - 99 mg/dL    Comment: Glucose reference range applies only to samples taken after fasting for at least 8 hours.  Glucose, capillary     Status: Abnormal   Collection Time: 03/09/21  7:54 AM  Result Value Ref Range   Glucose-Capillary 57 (L) 70 - 99 mg/dL    Comment: Glucose reference range applies only to samples taken after fasting for at least 8 hours.  Glucose, capillary     Status: None   Collection Time: 03/09/21  8:40 AM  Result Value Ref Range    Glucose-Capillary 73 70 - 99 mg/dL    Comment: Glucose reference range applies only to samples taken after fasting for at least 8 hours.    Assessment:   87 y.o.  admitted for syncope and vaginal bleeding now with signs of colitis and possible stool impaction secondary to stool ball noted on CT scan.     Plan:   1) Acute blood loss anemia secondary to possible uterine bleeding- hemodynamically stable after 3 units of PRBC. No further bleeding seen. Plan for outpatient endometrial biopsy. Low concern for malignancy given 2 mm endometrial strip, but can not be excluded at this time.   2) Medicine team treating colitis with IV fluids and antibiotics. Discussed with Dr. Loleta Books involvement of GI versus general surgery for input on management plan and evaluation of rebound tenderness.  Would additionally consider enemas to help with breaking up noted stool ball seen on CT.   Please call if any additional uterine bleeding is noted  Adrian Prows MD [place] 03/09/2021 10:27 AM

## 2021-03-09 NOTE — TOC Initial Note (Signed)
Transition of Care [facility]) - Initial/Assessment Note    Patient Details  Name: Stacy [name] MRN: [id number] Date of Birth: [birth year]  Transition of Care [facility]) CM/SW Contact:    Shelbie Hutching, RN Phone Number: 03/09/2021, 2:47 PM  Clinical Narrative:                 Patient admitted to the hospital with acute blood loss anemia.  Gyn has signed off and surgery will cont to follow but has found need for surgical intervention.  Patient does have large stool burden.  RNCM met with patient at the bedside this morning.  Patent reports that she lives with her son, he provides transportation.  Patient uses a rolling desk chair to get around the house, she says she has been doing this for a couple of years now.   PT this afternoon is recommending SNF, RNCM will follow up with patient tomorrow to discuss discharge recommendations.    Expected Discharge Plan: Skilled Nursing Facility Barriers to Discharge: Continued Medical Work up   Patient Goals and CMS Choice Patient states their goals for this hospitalization and ongoing recovery are:: Patient wants to fell better, get well and go home. CMS Medicare.gov Compare Post Acute Care list provided to:: Patient Choice offered to / list presented to : Patient  Expected Discharge Plan and Services Expected Discharge Plan: [place]   Discharge Planning Services: CM Consult   Living arrangements for the past 2 months: Single Family Home                 DME Arranged: N/A DME Agency: NA                  Prior Living Arrangements/Services Living arrangements for the past 2 months: Single Family Home Lives with:: Adult Children Patient language and need for interpreter reviewed:: Yes Do you feel safe going back to the place where you live?: Yes      Need for Family Participation in Patient Care: Yes (Comment) Care giver support system in place?: Yes (comment) (son)   Criminal Activity/Legal Involvement Pertinent to Current  Situation/Hospitalization: No - Comment as needed  Activities of Daily Living Home Assistive Devices/Equipment: Dentures (specify type), Oxygen, Grab bars around toilet, Grab bars in shower ADL Screening (condition at time of admission) Patient's cognitive ability adequate to safely complete daily activities?: Yes Is the patient deaf or have difficulty hearing?: No Does the patient have difficulty seeing, even when wearing glasses/contacts?: No Does the patient have difficulty concentrating, remembering, or making decisions?: No Patient able to express need for assistance with ADLs?: Yes Does the patient have difficulty dressing or bathing?: Yes Independently performs ADLs?: No Communication: Independent Dressing (OT): Independent Grooming: Independent Feeding: Independent Bathing: Needs assistance Is this a change from baseline?: Change from baseline, expected to last <3 days Toileting: Independent with device (comment) In/Out Bed: Independent with device (comment) Walks in Home: Dependent (states she uses office chair to get around house) Is this a change from baseline?: Pre-admission baseline Does the patient have difficulty walking or climbing stairs?: Yes Weakness of Legs: Both Weakness of Arms/Hands: None  Permission Sought/Granted Permission sought to share information with : Case Manager, Family Supports Permission granted to share information with : Yes, Verbal Permission Granted  Share Information with NAME: Gene     Permission granted to share info w Relationship: son     Emotional Assessment Appearance:: Appears stated age Attitude/Demeanor/Rapport: Engaged Affect (typically observed): Accepting Orientation: : Oriented  to Self, Oriented to Place Alcohol / Substance Use: Not Applicable Psych Involvement: No (comment)  Admission diagnosis:  Hypomagnesemia [E83.42] Syncope [R55] Vaginal bleeding [N93.9] Symptomatic anemia [D64.9] Anemia, unspecified type  [D64.9] AKI (acute kidney injury) ([place]) [N17.9] Patient Active Problem List   Diagnosis Date Noted   Hypomagnesemia 03/08/2021   AKI (acute kidney injury) ([place]) 03/08/2021   Chronic respiratory failure with hypoxia ([place]) 03/07/2021   Acute blood loss anemia 03/07/2021   Leukocytosis 03/07/2021   Acute colitis on CT 03/07/2021   Encounter for general adult medical examination with abnormal findings 07/05/2018   Uncontrolled type 2 diabetes mellitus with hyperglycemia ([place]) 07/05/2018   Neoplasm of uncertain behavior of skin of hand 07/05/2018   Simple chronic bronchitis ([place]) 12/31/2017   Type 2 diabetes mellitus with hyperglycemia ([place]) 12/31/2017   Congestive heart failure ([place]) 10/20/2017   Primary generalized (osteo)arthritis 09/21/2017   Vitamin B12 deficiency anemia, unspecified 09/21/2017   Chronic kidney disease, unspecified 09/21/2017   Vitamin D deficiency, unspecified 09/21/2017   COPD (chronic obstructive pulmonary disease) ([place]) 09/21/2017   Unspecified hearing loss, bilateral 09/21/2017   Age-related osteoporosis without current pathological fracture 09/21/2017   Other amnesia 09/21/2017   Cough 09/21/2017   Allergic rhinitis, unspecified 09/21/2017   Type 2 diabetes mellitus without complication, without long-term current use of insulin ([place]) 09/21/2017   Other seborrheic dermatitis 09/21/2017   Unspecified cervical disc disorder, mid-cervical region, unspecified level 09/21/2017   Neuromuscular dysfunction of bladder, unspecified 09/21/2017   Allergic rhinitis due to pollen 09/21/2017   Other primary ovarian failure 09/21/2017   Shortness of breath 09/21/2017   Acute bronchitis, unspecified 09/21/2017   Benign and innocent cardiac murmurs 09/21/2017   Dysuria 09/21/2017   Retention of urine, unspecified 09/21/2017   Unspecified urinary incontinence 09/21/2017   Mixed incontinence 09/21/2017   Insomnia, unspecified 09/21/2017   Presyncope secondary to symptomatic  anemia 09/21/2017   Polymyalgia rheumatica ([place]) 09/21/2017   Low back pain 09/21/2017   Hypoxemia 09/21/2017   Spondylosis without myelopathy or radiculopathy, lumbosacral region 09/21/2017   Pain in unspecified knee 09/21/2017   Mixed hyperlipidemia 09/21/2017   Cardiomegaly 09/21/2017   Abnormality of gait and mobility 09/21/2017   Personal history of malignant neoplasm of breast 04/15/2013   History of breast cancer 12/24/2012   Incisional hernia, without obstruction or gangrene 12/24/2012   Essential hypertension    Vitiligo    Arthritis    Hypothyroidism    Bowel trouble    PCP:  [name], MD Pharmacy:   [facility] [address] Reg[address] [employer] [address] [place] [address] Phone: [phone] Fax: [place], [place] [address] Phone: [phone] Fax: [phone]     Social Determinants of Health (SDOH) Interventions    Readmission Risk Interventions No flowsheet data found.

## 2021-03-09 NOTE — Progress Notes (Signed)
PT Cancellation Note  Patient Details Name: Stacy [name] MRN: [id number] DOB: 1927-04-21   Cancelled Treatment:    Reason Eval/Treat Not Completed: Fatigue/lethargy limiting ability to participate. Patient not able to participate at this time. Will re-attempt later as time allows.    Milbert Bixler 03/09/2021, 9:42 AM

## 2021-03-09 NOTE — Evaluation (Signed)
Physical Therapy Evaluation Patient Details Name: Stacy [name] MRN: [id number] DOB: 08/14/1927 Today's Date: 03/09/2021   History of Present Illness  Stacy [name] is a 87 y.o. female with medical history significant for Diabetes, hypertension, hypothyroidism, COPD, chronic respiratory failure home O2 which she uses as needed, polymyalgia rheumatica on chronic low-dose prednisone, who presents by EMS after she was found ' semiresponsive on the floor beside the toilet' with a large amount of blood in the toilet.  O2 sats were in the 70s on room air on arrival of EMS, requiring NRB and she was reportedly pale and diaphoretic.  Patient was seen several hours earlier in the emergency room with a 3-day history of vaginal bleeding confirmed with pelvic exam.  Vaginal ultrasound showed no abnormality.  Patient was discharged only to return in several hours later by EMS as above.  Clinical Impression  Patient received in bed, she reports she feels like she needs to have BM. Patient assisted in/out of bed with min assist. Mod to max assist for squat pivot to BSC. Patient found to have bleeding and clots when assisted with cleaning. RN notified. She did not have solid stool, just liquid in commode. Patient is limited by fatigue and weakness, unsteady with transfers. Patient will continue to benefit from skilled PT while here to improve functional independence and safety.      Follow Up Recommendations SNF    Equipment Recommendations  Other (comment) (TBD)    Recommendations for Other Services       Precautions / Restrictions Precautions Precautions: Fall Restrictions Weight Bearing Restrictions: No      Mobility  Bed Mobility Overal bed mobility: Needs Assistance Bed Mobility: Supine to Sit;Sit to Supine     Supine to sit: Min assist Sit to supine: Min assist   General bed mobility comments: min assist to bring legs off and on bed. Min assist to raise trunk and for positioning in bed.     Transfers Overall transfer level: Needs assistance Equipment used: None Transfers: Squat Pivot Transfers     Squat pivot transfers: Mod assist     General transfer comment: patient requires mod assist and cues for safety/hand placement with transfers. Difficulty taking steps and requires B UE support  Ambulation/Gait             General Gait Details: deferred, patient feeling sick  Stairs            Wheelchair Mobility    Modified Rankin (Stroke Patients Only)       Balance Overall balance assessment: Needs assistance Sitting-balance support: Feet supported Sitting balance-Leahy Scale: Good     Standing balance support: Bilateral upper extremity supported;During functional activity Standing balance-Leahy Scale: Poor Standing balance comment: reliant on mod assist and B UE support for balance and safety                             Pertinent Vitals/Pain Pain Assessment: Faces Faces Pain Scale: Hurts a little bit Pain Location: B legs Pain Descriptors / Indicators: Grimacing;Guarding;Discomfort Pain Intervention(s): Monitored during session;Repositioned    Home Living Family/patient expects to be discharged to:: Skilled nursing facility                 Additional Comments: patient lives with her son at home.    Prior Function           Comments: patient reports she only walks a few feet into the  bathroom. otherwise uses an office chair to get around.     Hand Dominance        Extremity/Trunk Assessment   Upper Extremity Assessment Upper Extremity Assessment: Generalized weakness    Lower Extremity Assessment Lower Extremity Assessment: Generalized weakness    Cervical / Trunk Assessment Cervical / Trunk Assessment: Kyphotic  Communication   Communication: No difficulties  Cognition Arousal/Alertness: Awake/alert Behavior During Therapy: WFL for tasks assessed/performed Overall Cognitive Status: Within Functional  Limits for tasks assessed                                        General Comments      Exercises     Assessment/Plan    PT Assessment Patient needs continued PT services  PT Problem List Decreased strength;Decreased mobility;Decreased safety awareness;Decreased activity tolerance;Decreased balance;Decreased knowledge of precautions;Decreased knowledge of use of DME;Decreased coordination       PT Treatment Interventions DME instruction;Therapeutic exercise;Gait training;Balance training;Functional mobility training;Therapeutic activities;Patient/family education    PT Goals (Current goals can be found in the Care Plan section)  Acute Rehab PT Goals Patient Stated Goal: to feel better PT Goal Formulation: With patient Time For Goal Achievement: 03/23/21 Potential to Achieve Goals: Fair    Frequency Min 2X/week   Barriers to discharge Inaccessible home environment;Decreased caregiver support      Co-evaluation               AM-PAC PT "6 Clicks" Mobility  Outcome Measure Help needed turning from your back to your side while in a flat bed without using bedrails?: A Little Help needed moving from lying on your back to sitting on the side of a flat bed without using bedrails?: A Little Help needed moving to and from a bed to a chair (including a wheelchair)?: A Lot Help needed standing up from a chair using your arms (e.g., wheelchair or bedside chair)?: A Lot Help needed to walk in hospital room?: Total Help needed climbing 3-5 steps with a railing? : Total 6 Click Score: 12    End of Session Equipment Utilized During Treatment: Oxygen Activity Tolerance: Patient limited by fatigue;Other (comment) (patient not feeling well) Patient left: in bed;with call bell/phone within reach;with bed alarm set Nurse Communication: Mobility status;Other (comment) (patient had blood when assisted with cleaning. Clots.) PT Visit Diagnosis: Unsteadiness on feet  (R26.81);Other abnormalities of gait and mobility (R26.89);Muscle weakness (generalized) (M62.81);Difficulty in walking, not elsewhere classified (R26.2)    Time: 1315-1340 PT Time Calculation (min) (ACUTE ONLY): 25 min   Charges:   PT Evaluation $PT Eval Moderate Complexity: 1 Mod PT Treatments $Therapeutic Activity: 8-22 mins        [employer], PT, [employer] 03/09/21,2:06 PM

## 2021-03-09 NOTE — Progress Notes (Signed)
Patient with two small episodes of vomiting this morning. CBG was 52 then 57 after 8oz juice. CBG 73 with an additional 4oz juice.  Also had 1 episode diarrhea but no blood in the stool. Dr. Loleta Books aware. Daughter also updated per patient request.  OB team in to see patient who endorses abdominal tenderness in the LLQ. Patient has been alert and oriented to self, place, and situation.   D5 ordered and LR discontinued by Dr. Loleta Books at this time.

## 2021-03-09 NOTE — Consult Note (Signed)
SURGICAL CONSULTATION NOTE   HISTORY OF PRESENT ILLNESS (HPI):  87 y.o. female presented to [facility] ED for evaluation of vaginal bleeding.  Patient not a good historian in regards to timing of starting abdominal pain or pain or bleeding.  Most of the history taken from chart, admitting physician and chart.  Patient presented to the emergency room with vaginal bleeding.  As per EMS she had decreased responsiveness.  At the ED she was found with hemoglobin of 5.  As per history the source of the anemia is suspected to be vaginal.  CT scan of the abdomen shows significant stool burden in the rectal vault.  There is also thickening of the descending colon.  I personally evaluated the images.  Patient denies any rectal bleeding.  Since admission patient has not had significant bowel movement.  As per nurse she had some watery stool this morning.  She has received Dulcolax suppositories and MiraLAX.  At the moment of my evaluation patient reports that the abdominal pain has significantly improved.  She denies any nausea or vomiting at the moment.  Surgery is consulted by Dr. Loleta Books in this context for evaluation and management of abdominal pain.  PAST MEDICAL HISTORY (PMH):  Past Medical History:  Diagnosis Date   Arthritis 1940   Asthma    Bowel trouble    Cancer ([place]) 2006   DCIS left breast   Hypertension 1940   Incisional hernia 2014   Personal history of radiation therapy    Polymyalgia ([place])    Thyroid disorder    Vitiligo 2012     PAST SURGICAL HISTORY ([place]):  Past Surgical History:  Procedure Laterality Date   BREAST BIOPSY Left 2006   +   BREAST SURGERY Left 2006   lumpectomy   [place]   COLON SURGERY  2004   COLONOSCOPY  2014   Dr. Candace Cruise   EYE SURGERY  2002   FOOT SURGERY  1996   HERNIA REPAIR  2013   left breast biopsy   2011     MEDICATIONS:  Prior to Admission medications   Medication Sig Start Date End Date Taking? Authorizing Provider  acetaminophen  (TYLENOL) 325 MG tablet Take 650 mg by mouth every 6 (six) hours as needed for pain.   Yes [provider]  Flaxseed, Linseed, (FLAX SEED OIL PO) Take 1 tablet by mouth daily.    Yes [provider]  gabapentin (NEURONTIN) 100 MG capsule TAKE 1 CAPSULE TWICE DAILY 01/05/21  Yes [name], MD  hydroxychloroquine (PLAQUENIL) 200 MG tablet Take 1 tablet by mouth See admin instructions. Takes alternates 1 tablet at bedtime then 2 tablets at night 07/26/18  Yes [provider]  predniSONE (DELTASONE) 10 MG tablet Take 1/2 to 1 tablet po QD Patient taking differently: Take 0.5 mg by mouth daily with breakfast. Take 1/2 to 1 tablet po QD 02/28/20  Yes Boscia, Heather E, NP  traMADol (ULTRAM) 50 MG tablet Take 1 tablet po TID prn pain. 06/09/20  Yes Boscia, Heather E, NP  glucose blood (ACCU-CHEK AVIVA PLUS) test strip USE TO CHECK GLUCOSE ONCE DAILY dx e11.65 Patient not taking: No sig reported 01/31/20   Ronnell Freshwater, NP  lisinopril (PRINIVIL,ZESTRIL) 5 MG tablet Take 1 tablet (5 mg total) by mouth daily. Patient not taking: No sig reported 10/05/18   Ronnell Freshwater, NP     ALLERGIES:  Allergies  Allergen Reactions   Aspirin Itching and Other (See Comments)  Dizzy/passing out   Augmentin [Amoxicillin-Pot Clavulanate]    Codeine Other (See Comments)    dizziness   Doxycycline Hyclate Other (See Comments)   Sulfamethoxazole-Trimethoprim Diarrhea   Tape Itching   Tramadol Other (See Comments)    Makes her feel dizzy and feels like passing out Pt states she can now take this medication.  (02/27/18)   Vitamin D Analogs    Azithromycin Rash and Hives     SOCIAL HISTORY:  Social History   Socioeconomic History   Marital status: Married    Spouse name: Not on file   Number of children: Not on file   Years of education: Not on file   Highest education level: Not on file  Occupational History   Not on file  Tobacco Use   Smoking status: Never   Smokeless  tobacco: Never  Vaping Use   Vaping Use: Never used  Substance and Sexual Activity   Alcohol use: No   Drug use: No   Sexual activity: Not on file  Other Topics Concern   Not on file  Social History Narrative   Not on file   Social Determinants of Health   Financial Resource Strain: Not on file  Food Insecurity: Not on file  Transportation Needs: Not on file  Physical Activity: Not on file  Stress: Not on file  Social Connections: Not on file  Intimate Partner Violence: Not on file      FAMILY HISTORY:  Family History  Problem Relation Age of Onset   Heart disease Mother    Emphysema Mother    Arthritis Father    Cancer Father    Breast cancer Neg Hx      REVIEW OF SYSTEMS:  Constitutional: denies weight loss, fever, chills, or sweats  Eyes: denies any other vision changes, history of eye injury  ENT: denies sore throat, hearing problems  Respiratory: denies shortness of breath, wheezing  Cardiovascular: denies chest pain, palpitations  Gastrointestinal: Positive abdominal pain, nausea and vomiting Genitourinary: denies burning with urination or urinary frequency.  Positive for vaginal bleeding Musculoskeletal: denies any other joint pains or cramps  Skin: denies any other rashes or skin discolorations  Neurological: denies any other headache, dizziness, weakness  Psychiatric: denies any other depression, anxiety   All other review of systems were negative   VITAL SIGNS:  Temp:  [98.4 F (36.9 C)-99.4 F (37.4 C)] 99.1 F (37.3 C) (06/14 1102) Pulse Rate:  [64-96] 96 (06/14 1102) Resp:  [16-21] 18 (06/14 1102) BP: (100-124)/(52-68) 107/57 (06/14 1102) SpO2:  [90 %-100 %] 98 % (06/14 1102)             INTAKE/OUTPUT:  This shift: Total I/O In: 980.1 [I.V.:880.1; IV Piggyback:100] Out: -   Last 2 shifts: @IOLAST2SHIFTS @   PHYSICAL EXAM:  Constitutional:  -- Normal body habitus  -- Awake, alert, and in no distress Eyes:  -- Pupils equally round and  reactive to light  -- No scleral icterus  Ear, nose, and throat:  -- No jugular venous distension  Pulmonary:  -- No crackles  -- Equal breath sounds bilaterally -- Breathing non-labored at rest Cardiovascular:  -- S1, S2 present  -- No pericardial rubs Gastrointestinal:  -- Abdomen soft, mild tender in the lower abdomen, non-distended, no guarding or rebound tenderness -- No abdominal masses appreciated, pulsatile or otherwise  --Rectal exam shows no palpable masses, normal sphincter tone, no bleeding Musculoskeletal and Integumentary:  -- Wounds: None appreciated -- Extremities: B/L UE and LE  FROM, hands and feet warm, no edema  Neurologic:  -- Motor function: intact and symmetric -- Sensation: intact and symmetric   Labs:  CBC Latest Ref Rng & Units 03/09/2021 03/08/2021 03/08/2021  WBC 4.0 - 10.5 K/uL 12.9(H) 17.6(H) -  Hemoglobin 12.0 - 15.0 g/dL 10.8(L) 12.5 13.7  Hematocrit 36.0 - 46.0 % 31.8(L) 37.1 41.6  Platelets 150 - 400 K/uL 131(L) 153 -   CMP Latest Ref Rng & Units 03/09/2021 03/08/2021 03/07/2021  Glucose 70 - 99 mg/dL 64(L) 116(H) 215(H)  BUN 8 - 23 mg/dL 28(H) 25(H) 21  Creatinine 0.44 - 1.00 mg/dL 1.53(H) 1.57(H) 1.69(H)  Sodium 135 - 145 mmol/L 134(L) 136 136  Potassium 3.5 - 5.1 mmol/L 3.6 4.8 4.0  Chloride 98 - 111 mmol/L 99 100 105  CO2 22 - 32 mmol/L 29 27 14(L)  Calcium 8.9 - 10.3 mg/dL 7.6(L) 7.8(L) 8.6(L)  Total Protein 6.5 - 8.1 g/dL - - -  Total Bilirubin 0.3 - 1.2 mg/dL - - -  Alkaline Phos 38 - 126 U/L - - -  AST 15 - 41 U/L - - -  ALT 0 - 44 U/L - - -    Imaging studies:  EXAM: CT ABDOMEN AND PELVIS WITH CONTRAST   TECHNIQUE: Multidetector CT imaging of the abdomen and pelvis was performed using the standard protocol following bolus administration of intravenous contrast.   CONTRAST:  123mL OMNIPAQUE IOHEXOL 300 MG/ML  SOLN   COMPARISON:  November 06, 2012   FINDINGS: Lower chest: Small hiatal hernia. No other abnormalities in the  lung bases are lower chest.   Hepatobiliary: The patient is status post cholecystectomy. Mild intra and extrahepatic biliary duct dilatation is unchanged. Portal vein is patent.   Pancreas: Unremarkable. No pancreatic ductal dilatation or surrounding inflammatory changes.   Spleen: Normal in size without focal abnormality.   Adrenals/Urinary Tract: The adrenal glands, kidneys, and ureters are normal. The bladder is poorly distended limiting evaluation. Bladder wall thickening not excluded.   Stomach/Bowel: The stomach and small bowel are normal. There is a large stool ball in the rectum without perirectal fat stranding or pneumatosis. The stool ball measures at least 7.8 x 7.4 x 6.5 cm. An anastomotic ring is identified in the region of the sigmoid colon. There is wall thickening associated with the colon. The distal half of the transverse colon and the descending colon are continuously affected. The cecum demonstrates some wall thickening and increased mucosal enhancement as seen on coronal image 29 and axial image 45 suggesting involvement of the cecum as well. There is sparing of most of the ascending colon and the proximal half of the transverse colon. There is mild pericolonic stranding in some locations, particularly adjacent to the descending colon. The appendix is not visualized but there is no secondary evidence of appendicitis.   Vascular/Lymphatic: Calcified atherosclerosis is seen in the nonaneurysmal aorta, extending into the iliac and femoral vessels. No dissection. No adenopathy.   Reproductive: Uterus and bilateral adnexa are unremarkable.   Other: There is a ventral hernia in the pelvis containing loops of small bowel without obstruction. This finding is unchanged since 2014. There is a small ventral hernia anteriorly best seen on sagittal image 88. There is some enhancing soft tissue nodularity surrounding this hernia well seen on sagittal image 86. This  finding is new since 2014.   Musculoskeletal: No acute or significant osseous findings.   IMPRESSION: 1. There is a large stool ball in the rectum measuring 7.8 x 7.4  x 6.5 cm without wall thickening, pneumatosis, or adjacent fat stranding. 2. Wall thickening associated with the cecum, the distal half of the transverse colon, and the descending colon with some regions of pericolonic fat stranding. This finding is consistent with colitis which could be inflammatory, infectious, or ischemic. 3. The bladder is poorly distended limiting evaluation but nonspecific wall thickening is not excluded. 4. There is a small ventral hernia in the upper abdomen at midline containing fat. There is enhancing soft tissue nodularity surrounding this hernia not seen in 2014. This soft tissue nodularity could be inflammatory, neoplastic, or less likely infectious. 5. Calcified atherosclerosis in the nonaneurysmal aorta.     Electronically Signed   By: Dorise Bullion III M.D   On: 03/07/2021 20:05  Assessment/Plan:  87 y.o. female with abdominal pain, complicated by pertinent comorbidities including diabetes, hypertension, COPD, polymyalgia rheumatica.  Patient with abdominal pain on the lower abdomen.  Physical exam without sign of acute abdomen.  From the history and images the source of the pain most likely colitis that could be infectious versus ischemic.  I do not think that the source of the bleeding is GI.  My rectal exam was completely negative for blood.  The sphincter tone was normal.  There was no palpable mass in the rectal vault.  I discussed the case at length with hospitalist.  Agree with current management to keep patient adequately hydrated agree with treatment with antibiotic therapy.  Patient needing chronic use of prednisone (immunosuppression).  White blood cell decreasing compared to admission.  Hemoglobin stable.  If there is no improvement I will recommend to consider GI consult  for further evaluation and management of colitis.  At the moment of my evaluation there is no sign of acute abdomen or any urgent surgical indication.  I will continue to follow along.  I also recommend to discussed with the patient and family members about goals of care in case surgical management needs to be considered.   Arnold Long, MD

## 2021-03-10 ENCOUNTER — Telehealth: Payer: Self-pay

## 2021-03-10 DIAGNOSIS — D62 Acute posthemorrhagic anemia: Secondary | ICD-10-CM | POA: Diagnosis not present

## 2021-03-10 DIAGNOSIS — Z7189 Other specified counseling: Secondary | ICD-10-CM | POA: Diagnosis not present

## 2021-03-10 DIAGNOSIS — K529 Noninfective gastroenteritis and colitis, unspecified: Secondary | ICD-10-CM | POA: Diagnosis not present

## 2021-03-10 DIAGNOSIS — Z515 Encounter for palliative care: Secondary | ICD-10-CM | POA: Diagnosis not present

## 2021-03-10 LAB — COMPREHENSIVE METABOLIC PANEL
ALT: 10 U/L (ref 0–44)
AST: 16 U/L (ref 15–41)
Albumin: 2.2 g/dL — ABNORMAL LOW (ref 3.5–5.0)
Alkaline Phosphatase: 48 U/L (ref 38–126)
Anion gap: 5 (ref 5–15)
BUN: 25 mg/dL — ABNORMAL HIGH (ref 8–23)
CO2: 25 mmol/L (ref 22–32)
Calcium: 7.8 mg/dL — ABNORMAL LOW (ref 8.9–10.3)
Chloride: 101 mmol/L (ref 98–111)
Creatinine, Ser: 1.26 mg/dL — ABNORMAL HIGH (ref 0.44–1.00)
GFR, Estimated: 40 mL/min — ABNORMAL LOW (ref >60)
Glucose, Bld: 75 mg/dL (ref 70–99)
Potassium: 3.6 mmol/L (ref 3.5–5.1)
Sodium: 131 mmol/L — ABNORMAL LOW (ref 135–145)
Total Bilirubin: 0.9 mg/dL (ref 0.3–1.2)
Total Protein: 5.1 g/dL — ABNORMAL LOW (ref 6.5–8.1)

## 2021-03-10 LAB — GLUCOSE, CAPILLARY
Glucose-Capillary: 76 mg/dL (ref 70–99)
Glucose-Capillary: 69 mg/dL — ABNORMAL LOW (ref 70–99)
Glucose-Capillary: 80 mg/dL (ref 70–99)
Glucose-Capillary: 67 mg/dL — ABNORMAL LOW (ref 70–99)
Glucose-Capillary: 106 mg/dL — ABNORMAL HIGH (ref 70–99)
Glucose-Capillary: 90 mg/dL (ref 70–99)
Glucose-Capillary: 91 mg/dL (ref 70–99)

## 2021-03-10 LAB — CBC
HCT: 31.3 % — ABNORMAL LOW (ref 36.0–46.0)
Hemoglobin: 10.6 g/dL — ABNORMAL LOW (ref 12.0–15.0)
MCH: 28.5 pg (ref 26.0–34.0)
MCHC: 33.9 g/dL (ref 30.0–36.0)
MCV: 84.1 fL (ref 80.0–100.0)
Platelets: 121 10*3/uL — ABNORMAL LOW (ref 150–400)
RBC: 3.72 MIL/uL — ABNORMAL LOW (ref 3.87–5.11)
RDW: 15.5 % (ref 11.5–15.5)
WBC: 12.3 10*3/uL — ABNORMAL HIGH (ref 4.0–10.5)
nRBC: 0 % (ref 0.0–0.2)

## 2021-03-10 MED ORDER — DEXTROSE 50 % IV SOLN
INTRAVENOUS | Status: AC
  Administered 2021-03-10: 50 mL
  Filled 2021-03-10: qty 50

## 2021-03-10 NOTE — Progress Notes (Signed)
   03/10/21 1635 03/10/21 1652  Assess: MEWS Score  Temp 98.6 F (37 C) 98 F (36.7 C)  BP 131/80 131/79  Pulse Rate 82 81  ECG Heart Rate  --  86  Resp (!) 27 19  Level of Consciousness  --  Alert  SpO2 99 % 99 %  O2 Device Room Air  --   Assess: MEWS Score  MEWS Temp 0 0  MEWS Systolic 0 0  MEWS Pulse 0 0  MEWS RR 2 0  MEWS LOC 0 0  MEWS Score 2 0  MEWS Score Color Yellow Green  Assess: if the MEWS score is Yellow or Red  Were vital signs taken at a resting state? Yes  --   Focused Assessment No change from prior assessment  --   Early Detection of Sepsis Score *See Row Information* Medium  --   MEWS guidelines implemented *See Row Information* No, vital signs rechecked  --

## 2021-03-10 NOTE — Progress Notes (Signed)
Patient ID: Stacy [name], female   DOB: 04-Apr-1927, 87 y.o.   MRN: [id number]     [place] Hospital Day(s): 2.   Post op day(s):  Marland Kitchen   Interval History: Patient seen and examined, no acute events or new complaints overnight. Patient reports she does not have abdominal pain.  She reported that yesterday she was nauseated.  There is no report of vomiting.  As per chart patient had multiple bowel movements between medium and large amount.  No blood seen on stool as per chart.   Vital signs in last 24 hours: [min-max] current  Temp:  [98.2 F (36.8 C)-99.1 F (37.3 C)] 98.3 F (36.8 C) (06/15 0821) Pulse Rate:  [85-96] 85 (06/15 0821) Resp:  [16-20] 18 (06/15 0821) BP: (99-138)/(45-67) 138/65 (06/15 0821) SpO2:  [95 %-100 %] 97 % (06/15 0821) Weight:  [74.9 kg] 74.9 kg (06/15 0500)       Weight: 74.9 kg BMI (Calculated): 31.22   Physical Exam:  Constitutional: alert, cooperative and no distress  Respiratory: breathing non-labored at rest  Cardiovascular: regular rate and sinus rhythm  Gastrointestinal: soft, non-tender, and non-distended  Labs:  CBC Latest Ref Rng & Units 03/10/2021 03/09/2021 03/08/2021  WBC 4.0 - 10.5 K/uL 12.3(H) 12.9(H) 17.6(H)  Hemoglobin 12.0 - 15.0 g/dL 10.6(L) 10.8(L) 12.5  Hematocrit 36.0 - 46.0 % 31.3(L) 31.8(L) 37.1  Platelets 150 - 400 K/uL 121(L) 131(L) 153   CMP Latest Ref Rng & Units 03/10/2021 03/09/2021 03/08/2021  Glucose 70 - 99 mg/dL 75 64(L) 116(H)  BUN 8 - 23 mg/dL 25(H) 28(H) 25(H)  Creatinine 0.44 - 1.00 mg/dL 1.26(H) 1.53(H) 1.57(H)  Sodium 135 - 145 mmol/L 131(L) 134(L) 136  Potassium 3.5 - 5.1 mmol/L 3.6 3.6 4.8  Chloride 98 - 111 mmol/L 101 99 100  CO2 22 - 32 mmol/L 25 29 27   Calcium 8.9 - 10.3 mg/dL 7.8(L) 7.6(L) 7.8(L)  Total Protein 6.5 - 8.1 g/dL 5.1(L) - -  Total Bilirubin 0.3 - 1.2 mg/dL 0.9 - -  Alkaline Phos 38 - 126 U/L 48 - -  AST 15 - 41 U/L 16 - -  ALT 0 - 44 U/L 10 - -    Imaging studies: No new pertinent  imaging studies   Assessment/Plan:  87 y.o. female with abdominal pain, complicated by pertinent comorbidities including diabetes, hypertension, COPD, polymyalgia rheumatica.  Today her abdominal pain is better.  The abdominal physical exam is benign.  Her labs continue with slowly decreasing trend of white blood cell count.  Hemoglobin stable.  Improving BUN/creatinine.  No sign of gross GI bleeding.  From the colitis standpoint I agree with current management.  Continue adequate hydration.  May advance diet to full liquids if patient is not nauseated today.  Continue aggressive bowel regimen as inpatient and plan to keep as outpatient.  No acute abdomen or any indication for urgent surgical management.  We will continue to follow along.   Arnold Long, MD

## 2021-03-10 NOTE — Progress Notes (Signed)
Palliative: Stacy [name] is sitting on the edge of the bed with nursing staff at bedside helping her to the bedside commode.  PMT will return.  PMT returns later in the afternoon to find Stacy [name] lying in the bed.  She is lying quietly in bed, greets me making and keeping eye contact.  She appears somewhat frail.  She is alert and oriented, able to make her needs known.  There is no family at bedside at this time.  Stacy [name] tells me that she continues to have some discomfort in her abdomen.  We talked about colitis and the treatment plan.  I offer encouragement.  Call to daughter, Stacy [name].  We talked about E. coli UTI and the treatment plan, colitis and the treatment plan.  We talked about PT evaluation for short-term care qualification.  All questions answered, no concerns at this point.  PMT to continue to follow.  Plan: Continue to treat the treatable.  Time for outcomes.  PT evaluation for STR qualification.  Outpatient palliative to follow.  42 minutes  [name], NP Palliative medicine team Team phone [phone] Greater than 50% of this time was spent counseling and coordinating care related to the above assessment and plan.

## 2021-03-10 NOTE — Progress Notes (Signed)
**Stacy [name] De-Identified via Obfuscation** Stacy Stacy [name]    Stacy Stacy [name]  CNO:[id number] DOB: October 13, 1926 DOA: 03/07/2021 PCP: [name], MD      Brief Narrative:  Stacy Stacy [name] is a 87 y.o. F with PMR on Plaquenil, prednisone, hx DCIS, HTN and well controlled asthma who presented with being found down.  Patient was in [place] until few days ago when she became weaker.  Around this time noticed what appeared to be scant vaginal bleeding (blood in underwear).  Went to the ER morning of admission, VSS, pelvic ultrasound reassuring, CBC unremarkable, dishcarged home with OB-Gyn follow up for vaginal bleeding.  Went home, then was found by family a few hours later, sluggish and semi-responsive by the toilet, toilet with red blood in it, brought back to the ER.  On return to the ER, Hgb 8.4 g/dL, Cr up to 1.69. Started on fluids and admitted for observation.  Noted to have SVT in the ER, found to have mag 0.9.     Assessment & Plan:  Syncope From infection, complicated by AKI and hypomagneseamia and SVT.      UTI -ecoli on culture -IV abx  Abdominal pain CT abdomen on admission showed segments of colon thickening consistent with colitis.  Also today with worsening abdominal discomfort.  -GI pathogen panel normal, doubt infectious or inflammatory colitis. - Bowel rest, IV fluids - Empiric Rocephin IV - Follow urine culture to final - General Surgery consult appreciated   AKI Daily labs   Acute blood loss anemia, suspected vaginal bleeding Hard to reconcile her Hgb here.  Baseline appears to be ~10 g.dL.  Initially here was 8.5 g/dL, then repeat was 5.3 g/dL, then after 2 units of blood, Hgb rose to 13 g/dL, confirmed with repeat.  Stacy [name]: Given that sequence, SUSPECT HGB 5.3 SPURIOUS With respect to vaginal bleeding, this was discussed with OB-Gyn, her exam rules out cervical/vaginal pathology, her normal CT and pelvic US imply that this is a minor uterine issue which can be followed up with biopsy  as an outpatient.  expect it to be self-limited.   - Follow up with OB-Gyn as outpatient   Polymyalgia rheumatica - Hold Plaquenil for now given concern for infection - Continue prednisone  Diabetes, type 2 well controlled with polyneuropathy Hypoglycemia this morning but IVF was off - D5W-- suspect this may be causing hyponatremia - Hold gabapentin for now - Continue sliding scale corrections   Hypomagnesemia Resolved with supplementation    Disposition: Status is: Observation  The patient will require care spanning > 2 midnights and should be moved to inpatient because:  ongoing bleeding that already required a transfusion, also ongoing IV fluids due to AKI  Dispo: The patient is from: Home              Anticipated d/c is to: SNf? Per PT              Patient currently is not medically stable to d/c.   Difficult to place patient No       Level of care: Med-Surg  DVT prophylaxis: enoxaparin (LOVENOX) injection 30 mg Start: 03/09/21 2200  Code Status: FULL               Subjective: No abdominal pain  Objective: Vitals:   03/10/21 0324 03/10/21 0500 03/10/21 0821 03/10/21 1131  BP: 123/67  138/65 (!) 142/96  Pulse: 90  85 86  Resp: 16  18 18   Temp: 98.4 F (36.9 C)  98.3 F (  36.8 C) 98.6 F (37 C)  TempSrc: Oral  Oral Oral  SpO2: 95%  97% 100%  Weight:  74.9 kg      Intake/Output Summary (Last 24 hours) at 03/10/2021 1318 Last data filed at 03/10/2021 1100 Gross per 24 hour  Intake 1296.93 ml  Output 400 ml  Net 896.93 ml   Filed Weights   03/10/21 0500  Weight: 74.9 kg    Examination:  General: Appearance:    Frail female in no acute distress   +BS, soft  Lungs:     respirations unlabored  Heart:    Normal heart rate.   MS:   All extremities are intact.    Neurologic:   Awake, alert, pleasant and cooperative       Data Reviewed: I have personally reviewed following labs and imaging studies:  CBC: Recent Labs  Lab  03/15/2021 1113 15-Mar-2021 1824 03/08/21 0051 03/08/21 0526 03/08/21 0832 03/09/21 0524 03/10/21 0625  WBC 9.7 17.3*  --   --  17.6* 12.9* 12.3*  NEUTROABS 4.4 7.5  --   --   --   --   --   HGB 9.4* 8.5* 5.3* 13.7 12.5 10.8* 10.6*  HCT 29.6* 28.2* 16.6* 41.6 37.1 31.8* 31.3*  MCV 86.3 91.3  --   --  83.7 85.0 84.1  PLT 225 238  --   --  153 131* 742*   Basic Metabolic Panel: Recent Labs  Lab 15-Mar-2021 1113 15-Mar-2021 1824 03/08/21 0209 03/08/21 0832 03/09/21 0524 03/10/21 0625  NA 136 136  --  136 134* 131*  K 3.7 4.0  --  4.8 3.6 3.6  CL 103 105  --  100 99 101  CO2 27 14*  --  27 29 25   GLUCOSE 112* 215*  --  116* 64* 75  BUN 20 21  --  25* 28* 25*  CREATININE 1.36* 1.69*  --  1.57* 1.53* 1.26*  CALCIUM 8.5* 8.6*  --  7.8* 7.6* 7.8*  MG  --   --  0.9* 2.9*  --   --    GFR: Estimated Creatinine Clearance: 26.9 mL/min (A) (by C-G formula based on SCr of 1.26 mg/dL (H)). Liver Function Tests: Recent Labs  Lab March 15, 2021 1113 03/10/21 0625  AST 14* 16  ALT 7 10  ALKPHOS 55 48  BILITOT 0.4 0.9  PROT 6.3* 5.1*  ALBUMIN 3.2* 2.2*   No results for input(s): LIPASE, AMYLASE in the last 168 hours. No results for input(s): AMMONIA in the last 168 hours. Coagulation Profile: No results for input(s): INR, PROTIME in the last 168 hours. Cardiac Enzymes: No results for input(s): CKTOTAL, CKMB, CKMBINDEX, TROPONINI in the last 168 hours. BNP (last 3 results) No results for input(s): PROBNP in the last 8760 hours. HbA1C: Recent Labs    15-Mar-2021 1824  HGBA1C 5.6   CBG: Recent Labs  Lab 03/10/21 0600 03/10/21 0825 03/10/21 0915 03/10/21 1132 03/10/21 1243  GLUCAP 76 69* 80 67* 106*   Lipid Profile: No results for input(s): CHOL, HDL, LDLCALC, TRIG, CHOLHDL, LDLDIRECT in the last 72 hours. Thyroid Function Tests: No results for input(s): TSH, T4TOTAL, FREET4, T3FREE, THYROIDAB in the last 72 hours. Anemia Panel: No results for input(s): VITAMINB12, FOLATE, FERRITIN,  TIBC, IRON, RETICCTPCT in the last 72 hours. Urine analysis:    Component Value Date/Time   COLORURINE AMBER (A) 03/08/2021 1613   APPEARANCEUR HAZY (A) 03/08/2021 1613   APPEARANCEUR Clear 07/05/2018 1446   LABSPEC 1.042 (H) 03/08/2021 1613  LABSPEC 1.036 04/30/2012 1618   PHURINE 5.0 03/08/2021 1613   GLUCOSEU NEGATIVE 03/08/2021 1613   GLUCOSEU Negative 04/30/2012 1618   HGBUR NEGATIVE 03/08/2021 1613   BILIRUBINUR NEGATIVE 03/08/2021 1613   BILIRUBINUR Negative 07/05/2018 1446   BILIRUBINUR Negative 04/30/2012 1618   KETONESUR 5 (A) 03/08/2021 1613   PROTEINUR NEGATIVE 03/08/2021 1613   NITRITE NEGATIVE 03/08/2021 1613   LEUKOCYTESUR MODERATE (A) 03/08/2021 1613   LEUKOCYTESUR 2+ 04/30/2012 1618    Recent Results (from the past 240 hour(s))  Resp Panel by RT-PCR (Flu A&B, Covid) Nasopharyngeal Swab     Status: None   Collection Time: 03/07/21  6:24 PM   Specimen: Nasopharyngeal Swab; Nasopharyngeal(NP) swabs in vial transport medium  Result Value Ref Range Status   SARS Coronavirus 2 by RT PCR NEGATIVE NEGATIVE Final    Comment: (Stacy [name]) SARS-CoV-2 target nucleic acids are NOT DETECTED.  The SARS-CoV-2 RNA is generally detectable in upper respiratory specimens during the acute phase of infection. The lowest concentration of SARS-CoV-2 viral copies this assay can detect is 138 copies/mL. A negative result does not preclude SARS-Cov-2 infection and should not be used as the sole basis for treatment or other patient management decisions. A negative result may occur with  improper specimen collection/handling, submission of specimen other than nasopharyngeal swab, presence of viral mutation(s) within the areas targeted by this assay, and inadequate number of viral copies(<138 copies/mL). A negative result must be combined with clinical observations, patient history, and epidemiological information. The expected result is Negative.  Fact Sheet for Patients:   EntrepreneurPulse.com.au  Fact Sheet for Healthcare Providers:  IncredibleEmployment.be  This test is no t yet approved or cleared by the [place] FDA and  has been authorized for detection and/or diagnosis of SARS-CoV-2 by FDA under an Emergency Use Authorization (EUA). This EUA will remain  in effect (meaning this test can be used) for the duration of the COVID-19 declaration under Section 564(b)(1) of the Act, 21 U.S.C.section 360bbb-3(b)(1), unless the authorization is terminated  or revoked sooner.       Influenza A by PCR NEGATIVE NEGATIVE Final   Influenza B by PCR NEGATIVE NEGATIVE Final    Comment: (Stacy [name]) The Xpert Xpress SARS-CoV-2/FLU/RSV plus assay is intended as an aid in the diagnosis of influenza from Nasopharyngeal swab specimens and should not be used as a sole basis for treatment. Nasal washings and aspirates are unacceptable for Xpert Xpress SARS-CoV-2/FLU/RSV testing.  Fact Sheet for Patients: EntrepreneurPulse.com.au  Fact Sheet for Healthcare Providers: IncredibleEmployment.be  This test is not yet approved or cleared by the [place] FDA and has been authorized for detection and/or diagnosis of SARS-CoV-2 by FDA under an Emergency Use Authorization (EUA). This EUA will remain in effect (meaning this test can be used) for the duration of the COVID-19 declaration under Section 564(b)(1) of the Act, 21 U.S.C. section 360bbb-3(b)(1), unless the authorization is terminated or revoked.  Performed at [facility], [address], [place], [place] [address]   Urine Culture     Status: Abnormal (Preliminary result)   Collection Time: 03/08/21  4:14 PM   Specimen: Urine, Random  Result Value Ref Range Status   Specimen Description   Final    URINE, RANDOM Performed at [facility], [address], [place], [place] [address]    Special Requests   Final     NONE Performed at [facility], [address] [place], [place] [address]    Culture (A)  Final    >=  100,000 COLONIES/mL ESCHERICHIA COLI SUSCEPTIBILITIES TO FOLLOW Performed at [facility] [address], [place], [place] [address]    Report Status PENDING  Incomplete  CULTURE, BLOOD (ROUTINE X 2) w Reflex to ID Panel     Status: None (Preliminary result)   Collection Time: 03/08/21  4:26 PM   Specimen: BLOOD  Result Value Ref Range Status   Specimen Description BLOOD LEFT ANTECUBITAL  Final   Special Requests   Final    BOTTLES DRAWN AEROBIC AND ANAEROBIC Blood Culture results may not be optimal due to an excessive volume of blood received in culture bottles   Culture   Final    NO GROWTH 2 DAYS Performed at [facility], [address], [place], [place] [address]    Report Status PENDING  Incomplete  CULTURE, BLOOD (ROUTINE X 2) w Reflex to ID Panel     Status: None (Preliminary result)   Collection Time: 03/08/21  4:33 PM   Specimen: BLOOD  Result Value Ref Range Status   Specimen Description BLOOD BLOOD LEFT FOREARM  Final   Special Requests   Final    BOTTLES DRAWN AEROBIC ONLY Blood Culture results may not be optimal due to an inadequate volume of blood received in culture bottles   Culture   Final    NO GROWTH 2 DAYS Performed at [facility], [address] [place], [place] [address]    Report Status PENDING  Incomplete  Gastrointestinal Panel by PCR , Stool     Status: None   Collection Time: 03/09/21  1:57 PM   Specimen: Stool  Result Value Ref Range Status   Campylobacter species NOT DETECTED NOT DETECTED Final   Plesimonas shigelloides NOT DETECTED NOT DETECTED Final   Salmonella species NOT DETECTED NOT DETECTED Final   Yersinia enterocolitica NOT DETECTED NOT DETECTED Final   Vibrio species NOT DETECTED NOT DETECTED Final   Vibrio cholerae NOT DETECTED NOT DETECTED Final   Enteroaggregative E coli (EAEC) NOT DETECTED  NOT DETECTED Final   Enteropathogenic E coli (EPEC) NOT DETECTED NOT DETECTED Final   Enterotoxigenic E coli (ETEC) NOT DETECTED NOT DETECTED Final   Shiga like toxin producing E coli (STEC) NOT DETECTED NOT DETECTED Final   Shigella/Enteroinvasive E coli (EIEC) NOT DETECTED NOT DETECTED Final   Cryptosporidium NOT DETECTED NOT DETECTED Final   Cyclospora cayetanensis NOT DETECTED NOT DETECTED Final   Entamoeba histolytica NOT DETECTED NOT DETECTED Final   Giardia lamblia NOT DETECTED NOT DETECTED Final   Adenovirus F40/41 NOT DETECTED NOT DETECTED Final   Astrovirus NOT DETECTED NOT DETECTED Final   Norovirus GI/GII NOT DETECTED NOT DETECTED Final   Rotavirus A NOT DETECTED NOT DETECTED Final   Sapovirus (I, II, IV, and V) NOT DETECTED NOT DETECTED Final    Comment: Performed at [facility], [address] [place], [place] [address]  C Difficile Quick Screen w PCR reflex     Status: None   Collection Time: 03/09/21  1:57 PM   Specimen: Stool  Result Value Ref Range Status   C Diff antigen NEGATIVE NEGATIVE Final   C Diff toxin NEGATIVE NEGATIVE Final   C Diff interpretation No C. difficile detected.  Final    Comment: Performed at [facility], [address], [place], [place] [address]         Radiology Studies: No results found.      Scheduled Meds:  sodium chloride   Intravenous Once   enoxaparin (  LOVENOX) injection  30 mg Subcutaneous Q24H   insulin aspart  0-5 Units Subcutaneous QHS   insulin aspart  0-9 Units Subcutaneous TID WC   mouth rinse  15 mL Mouth Rinse BID   polyethylene glycol  17 g Oral Daily   predniSONE  5 mg Oral Q breakfast   sodium chloride flush  3 mL Intravenous Q12H   Continuous Infusions:  cefTRIAXone (ROCEPHIN)  IV Stopped (03/10/21 0106)   dextrose 75 mL/hr at 03/10/21 0831   metronidazole 500 mg (03/10/21 1208)     LOS: 2 days    Time spent: 25  minutes    Geradine Girt, DO [facility] 03/10/2021,  1:18 PM     Please page though [place] or Epic secure chat:  For [employer], Adult nurse

## 2021-03-11 DIAGNOSIS — Z7189 Other specified counseling: Secondary | ICD-10-CM | POA: Diagnosis not present

## 2021-03-11 DIAGNOSIS — D62 Acute posthemorrhagic anemia: Secondary | ICD-10-CM | POA: Diagnosis not present

## 2021-03-11 DIAGNOSIS — K529 Noninfective gastroenteritis and colitis, unspecified: Secondary | ICD-10-CM | POA: Diagnosis not present

## 2021-03-11 DIAGNOSIS — Z515 Encounter for palliative care: Secondary | ICD-10-CM | POA: Diagnosis not present

## 2021-03-11 LAB — GLUCOSE, CAPILLARY
Glucose-Capillary: 99 mg/dL (ref 70–99)
Glucose-Capillary: 99 mg/dL (ref 70–99)
Glucose-Capillary: 132 mg/dL — ABNORMAL HIGH (ref 70–99)
Glucose-Capillary: 91 mg/dL (ref 70–99)
Glucose-Capillary: 119 mg/dL — ABNORMAL HIGH (ref 70–99)

## 2021-03-11 LAB — BASIC METABOLIC PANEL
Anion gap: 6 (ref 5–15)
BUN: 19 mg/dL (ref 8–23)
CO2: 24 mmol/L (ref 22–32)
Calcium: 7.6 mg/dL — ABNORMAL LOW (ref 8.9–10.3)
Chloride: 98 mmol/L (ref 98–111)
Creatinine, Ser: 1.2 mg/dL — ABNORMAL HIGH (ref 0.44–1.00)
GFR, Estimated: 43 mL/min — ABNORMAL LOW (ref >60)
Glucose, Bld: 96 mg/dL (ref 70–99)
Potassium: 3.2 mmol/L — ABNORMAL LOW (ref 3.5–5.1)
Sodium: 128 mmol/L — ABNORMAL LOW (ref 135–145)

## 2021-03-11 LAB — URINE CULTURE: Culture: >=100000 — AB

## 2021-03-11 LAB — CBC
HCT: 30 % — ABNORMAL LOW (ref 36.0–46.0)
Hemoglobin: 10.1 g/dL — ABNORMAL LOW (ref 12.0–15.0)
MCH: 28.6 pg (ref 26.0–34.0)
MCHC: 33.7 g/dL (ref 30.0–36.0)
MCV: 85 fL (ref 80.0–100.0)
Platelets: 127 10*3/uL — ABNORMAL LOW (ref 150–400)
RBC: 3.53 MIL/uL — ABNORMAL LOW (ref 3.87–5.11)
RDW: 15 % (ref 11.5–15.5)
WBC: 9.7 10*3/uL (ref 4.0–10.5)
nRBC: 0 % (ref 0.0–0.2)

## 2021-03-11 MED ORDER — DICLOFENAC SODIUM 1 % EX GEL
2.0000 g | Freq: Four times a day (QID) | CUTANEOUS | Status: DC
  Administered 2021-03-11 – 2021-03-16 (×21): 2 g via TOPICAL
  Filled 2021-03-11: qty 100

## 2021-03-11 MED ORDER — LIDOCAINE 5 % EX PTCH
1.0000 | MEDICATED_PATCH | CUTANEOUS | Status: DC
  Administered 2021-03-11 – 2021-03-16 (×6): 1 via TRANSDERMAL
  Filled 2021-03-11 (×6): qty 1

## 2021-03-11 MED ORDER — PANTOPRAZOLE SODIUM 40 MG PO TBEC
40.0000 mg | DELAYED_RELEASE_TABLET | Freq: Every day | ORAL | Status: DC
  Administered 2021-03-11 – 2021-03-13 (×3): 40 mg via ORAL
  Filled 2021-03-11 (×3): qty 1

## 2021-03-11 MED ORDER — POLYETHYLENE GLYCOL 3350 17 G PO PACK: 17.0000 g | PACK | Freq: Every day | ORAL | Status: DC | PRN

## 2021-03-11 MED ORDER — ENSURE ENLIVE PO LIQD
237.0000 mL | Freq: Two times a day (BID) | ORAL | Status: DC
  Administered 2021-03-12 (×2): 237 mL via ORAL

## 2021-03-11 MED ORDER — POTASSIUM CHLORIDE IN NACL 20-0.9 MEQ/L-% IV SOLN
INTRAVENOUS | Status: DC
  Filled 2021-03-11 (×9): qty 1000

## 2021-03-11 NOTE — Progress Notes (Signed)
PT Cancellation Note  Patient Details Name: Stacy [name] MRN: [id number] DOB: [birth year]   Cancelled Treatment:    Reason Eval/Treat Not Completed: Patient declined, no reason specified. Patient declines oob or bed exercises at this time. Reports she is not feeling well. Seemed a bit confused. Assisted to reposition patient in bed. Will continue to follow.    Simcha Farrington 03/11/2021, 4:03 PM

## 2021-03-11 NOTE — Progress Notes (Signed)
Palliative: Stacy [name] is lying quietly in bed.  She is alert and oriented to self and situation, but has some memory loss.  She is calm and cooperative.  She complains about nausea, but there has been no vomiting.  Her largest complaint this morning is arthritic pain in her left hip.  Surgeon was at bedside encouraging Stacy [name] and sharing that he sees no need for surgery at this point.  Bedside nursing staff and I adjust Stacy [name] in the bed for comfort.  I encouraged her to do the best she can and that she should continue to show improvement.  She is very pleasant and thankful for staff's assistants.  Encouraged her to work with PT.  Conference with attending, bedside nursing staff, surgeon, PT, transition of care team related to patient condition, needs, goals of care, disposition.  Plan: Continue to treat the treatable.  Short-term rehab if qualified.  Outpatient palliative services to follow.  4 minutes [name], NP Palliative medicine team Team phone [phone] Greater than 50% of this time was spent counseling and coordinating care related to the above assessment and plan.

## 2021-03-11 NOTE — Progress Notes (Signed)
[facility] PROGRESS NOTE    Stacy [name]  DGL:[id number] DOB: [birth year] DOA: 03/07/2021 PCP: [name], MD      Brief Narrative:  Stacy [name] is a 87 y.o. F with PMR on Plaquenil, prednisone, hx DCIS, HTN and well controlled asthma who presented with being found down.  Patient was in [place] until few days ago when she became weaker.  Around this time noticed what appeared to be scant vaginal bleeding (blood in underwear).  Went to the ER morning of admission, VSS, pelvic ultrasound reassuring, CBC unremarkable, dishcarged home with OB-Gyn follow up for vaginal bleeding.  Went home, then was found by family a few hours later, sluggish and semi-responsive by the toilet, toilet with red blood in it, brought back to the ER.  On return to the ER, Hgb 8.4 g/dL, Cr up to 1.69. Started on fluids and admitted for observation.  Noted to have SVT in the ER, found to have mag 0.9.     Assessment & Plan:  Syncope From infection, complicated by AKI and hypomagneseamia and SVT.      UTI -ecoli on culture -IV abx  Abdominal pain CT abdomen on admission showed segments of colon thickening consistent with colitis.  Also today with worsening abdominal discomfort.  -GI pathogen panel normal, doubt infectious or inflammatory colitis. - Bowel rest, IV fluids - Empiric Rocephin IV - Follow urine culture to final - General Surgery consult appreciated   AKI Daily labs   Acute blood loss anemia, suspected vaginal bleeding Hard to reconcile her Hgb here.  Baseline appears to be ~10 g.dL.  Initially here was 8.5 g/dL, then repeat was 5.3 g/dL, then after 2 units of blood, Hgb rose to 13 g/dL, confirmed with repeat.  Note: Given that sequence, SUSPECT HGB 5.3 SPURIOUS With respect to vaginal bleeding, this was discussed with OB-Gyn, her exam rules out cervical/vaginal pathology, her normal CT and pelvic US imply that this is a minor uterine issue which can be followed up with biopsy  as an outpatient.  expect it to be self-limited.   - Follow up with OB-Gyn as outpatient   Polymyalgia rheumatica - Hold Plaquenil for now given concern for infection - Continue prednisone  Diabetes, type 2 well controlled with polyneuropathy Hypoglycemia this morning but IVF was off - D5W-- suspect this may be causing hyponatremia - Hold gabapentin for now - Continue sliding scale corrections   Hypomagnesemia/hypokalemia -replete  Hyponatremia -from d5?-- change fluids    Disposition: Status is: inpt Updated son on phone-- patient has had several + COVID contacts  The patient will require care spanning > 2 midnights and should be moved to inpatient because:  ongoing bleeding that already required a transfusion, also ongoing IV fluids due to AKI  Dispo: The patient is from: Home              Anticipated d/c is to: SNf? Per PT              Patient currently is not medically stable to d/c.   Difficult to place patient No       Level of care: Med-Surg  DVT prophylaxis: enoxaparin (LOVENOX) injection 30 mg Start: 03/09/21 2200  Code Status: FULL               Subjective: Thinks she may be nauseous   Objective: Vitals:   03/11/21 0002 03/11/21 0500 03/11/21 0608 03/11/21 0804  BP: (!) 143/72  (!) 143/68 (!) 146/81  Pulse: 88  79 89  Resp: 20  18 20   Temp: 99.3 F (37.4 C)  98.5 F (36.9 C) 98.4 F (36.9 C)  TempSrc: Oral   Oral  SpO2: 98%  100% 98%  Weight:  76.1 kg      Intake/Output Summary (Last 24 hours) at 03/11/2021 3419 Last data filed at 03/11/2021 0432 Gross per 24 hour  Intake 120 ml  Output 950 ml  Net -830 ml   Filed Weights   03/10/21 0500 03/11/21 0500  Weight: 74.9 kg 76.1 kg    Examination:  General: Appearance:    Frail female in no acute distress     Lungs:     respirations unlabored  Heart:    Normal heart rate.   MS:   All extremities are intact.    Neurologic:   Awake, alert, pleasantly confused          Data Reviewed: I have personally reviewed following labs and imaging studies:  CBC: Recent Labs  Lab 03/07/21 1113 03/07/21 1824 03/08/21 0051 03/08/21 0526 03/08/21 0832 03/09/21 0524 03/10/21 0625 03/11/21 0524  WBC 9.7 17.3*  --   --  17.6* 12.9* 12.3* 9.7  NEUTROABS 4.4 7.5  --   --   --   --   --   --   HGB 9.4* 8.5*   < > 13.7 12.5 10.8* 10.6* 10.1*  HCT 29.6* 28.2*   < > 41.6 37.1 31.8* 31.3* 30.0*  MCV 86.3 91.3  --   --  83.7 85.0 84.1 85.0  PLT 225 238  --   --  153 131* 121* 127*   < > = values in this interval not displayed.   Basic Metabolic Panel: Recent Labs  Lab 03/07/21 1824 03/08/21 0209 03/08/21 0832 03/09/21 0524 03/10/21 0625 03/11/21 0524  NA 136  --  136 134* 131* 128*  K 4.0  --  4.8 3.6 3.6 3.2*  CL 105  --  100 99 101 98  CO2 14*  --  27 29 25 24   GLUCOSE 215*  --  116* 64* 75 96  BUN 21  --  25* 28* 25* 19  CREATININE 1.69*  --  1.57* 1.53* 1.26* 1.20*  CALCIUM 8.6*  --  7.8* 7.6* 7.8* 7.6*  MG  --  0.9* 2.9*  --   --   --    GFR: Estimated Creatinine Clearance: 28.5 mL/min (A) (by C-G formula based on SCr of 1.2 mg/dL (H)). Liver Function Tests: Recent Labs  Lab 03/07/21 1113 03/10/21 0625  AST 14* 16  ALT 7 10  ALKPHOS 55 48  BILITOT 0.4 0.9  PROT 6.3* 5.1*  ALBUMIN 3.2* 2.2*   No results for input(s): LIPASE, AMYLASE in the last 168 hours. No results for input(s): AMMONIA in the last 168 hours. Coagulation Profile: No results for input(s): INR, PROTIME in the last 168 hours. Cardiac Enzymes: No results for input(s): CKTOTAL, CKMB, CKMBINDEX, TROPONINI in the last 168 hours. BNP (last 3 results) No results for input(s): PROBNP in the last 8760 hours. HbA1C: No results for input(s): HGBA1C in the last 72 hours.  CBG: Recent Labs  Lab 03/10/21 1243 03/10/21 1620 03/10/21 2232 03/11/21 0503 03/11/21 0804  GLUCAP 106* 90 91 99 99   Lipid Profile: No results for input(s): CHOL, HDL, LDLCALC, TRIG, CHOLHDL,  LDLDIRECT in the last 72 hours. Thyroid Function Tests: No results for input(s): TSH, T4TOTAL, FREET4, T3FREE, THYROIDAB in the last 72 hours. Anemia Panel:  No results for input(s): VITAMINB12, FOLATE, FERRITIN, TIBC, IRON, RETICCTPCT in the last 72 hours. Urine analysis:    Component Value Date/Time   COLORURINE AMBER (A) 03/08/2021 1613   APPEARANCEUR HAZY (A) 03/08/2021 1613   APPEARANCEUR Clear 07/05/2018 1446   LABSPEC 1.042 (H) 03/08/2021 1613   LABSPEC 1.036 04/30/2012 1618   PHURINE 5.0 03/08/2021 1613   GLUCOSEU NEGATIVE 03/08/2021 1613   GLUCOSEU Negative 04/30/2012 1618   HGBUR NEGATIVE 03/08/2021 1613   BILIRUBINUR NEGATIVE 03/08/2021 1613   BILIRUBINUR Negative 07/05/2018 1446   BILIRUBINUR Negative 04/30/2012 1618   KETONESUR 5 (A) 03/08/2021 1613   PROTEINUR NEGATIVE 03/08/2021 1613   NITRITE NEGATIVE 03/08/2021 1613   LEUKOCYTESUR MODERATE (A) 03/08/2021 1613   LEUKOCYTESUR 2+ 04/30/2012 1618    Recent Results (from the past 240 hour(s))  Resp Panel by RT-PCR (Flu A&B, Covid) Nasopharyngeal Swab     Status: None   Collection Time: 03/07/21  6:24 PM   Specimen: Nasopharyngeal Swab; Nasopharyngeal(NP) swabs in vial transport medium  Result Value Ref Range Status   SARS Coronavirus 2 by RT PCR NEGATIVE NEGATIVE Final    Comment: (NOTE) SARS-CoV-2 target nucleic acids are NOT DETECTED.  The SARS-CoV-2 RNA is generally detectable in upper respiratory specimens during the acute phase of infection. The lowest concentration of SARS-CoV-2 viral copies this assay can detect is 138 copies/mL. A negative result does not preclude SARS-Cov-2 infection and should not be used as the sole basis for treatment or other patient management decisions. A negative result may occur with  improper specimen collection/handling, submission of specimen other than nasopharyngeal swab, presence of viral mutation(s) within the areas targeted by this assay, and inadequate number of  viral copies(<138 copies/mL). A negative result must be combined with clinical observations, patient history, and epidemiological information. The expected result is Negative.  Fact Sheet for Patients:  EntrepreneurPulse.com.au  Fact Sheet for Healthcare Providers:  IncredibleEmployment.be  This test is no t yet approved or cleared by the [place] FDA and  has been authorized for detection and/or diagnosis of SARS-CoV-2 by FDA under an Emergency Use Authorization (EUA). This EUA will remain  in effect (meaning this test can be used) for the duration of the COVID-19 declaration under Section 564(b)(1) of the Act, 21 U.S.C.section 360bbb-3(b)(1), unless the authorization is terminated  or revoked sooner.       Influenza A by PCR NEGATIVE NEGATIVE Final   Influenza B by PCR NEGATIVE NEGATIVE Final    Comment: (NOTE) The Xpert Xpress SARS-CoV-2/FLU/RSV plus assay is intended as an aid in the diagnosis of influenza from Nasopharyngeal swab specimens and should not be used as a sole basis for treatment. Nasal washings and aspirates are unacceptable for Xpert Xpress SARS-CoV-2/FLU/RSV testing.  Fact Sheet for Patients: EntrepreneurPulse.com.au  Fact Sheet for Healthcare Providers: IncredibleEmployment.be  This test is not yet approved or cleared by the [place] FDA and has been authorized for detection and/or diagnosis of SARS-CoV-2 by FDA under an Emergency Use Authorization (EUA). This EUA will remain in effect (meaning this test can be used) for the duration of the COVID-19 declaration under Section 564(b)(1) of the Act, 21 U.S.C. section 360bbb-3(b)(1), unless the authorization is terminated or revoked.  Performed at [facility], [address], [place], [place] [address]   Urine Culture     Status: Abnormal   Collection Time: 03/08/21  4:14 PM   Specimen: Urine, Random  Result  Value Ref Range Status   Specimen Description  Final    URINE, RANDOM Performed at [facility], [address] [place], [place] [address]    Special Requests   Final    NONE Performed at [facility], [address] [place], [place] [address]    Culture >=100,000 COLONIES/mL ESCHERICHIA COLI (A)  Final   Report Status 03/11/2021 FINAL  Final   Organism ID, Bacteria ESCHERICHIA COLI (A)  Final      Susceptibility   Escherichia coli - MIC*    AMPICILLIN >=32 RESISTANT Resistant     CEFAZOLIN <=4 SENSITIVE Sensitive     CEFEPIME <=0.12 SENSITIVE Sensitive     CEFTRIAXONE <=0.25 SENSITIVE Sensitive     CIPROFLOXACIN >=4 RESISTANT Resistant     GENTAMICIN <=1 SENSITIVE Sensitive     IMIPENEM 0.5 SENSITIVE Sensitive     NITROFURANTOIN <=16 SENSITIVE Sensitive     TRIMETH/SULFA <=20 SENSITIVE Sensitive     AMPICILLIN/SULBACTAM 16 INTERMEDIATE Intermediate     PIP/TAZO <=4 SENSITIVE Sensitive     * >=100,000 COLONIES/mL ESCHERICHIA COLI  CULTURE, BLOOD (ROUTINE X 2) w Reflex to ID Panel     Status: None (Preliminary result)   Collection Time: 03/08/21  4:26 PM   Specimen: BLOOD  Result Value Ref Range Status   Specimen Description BLOOD LEFT ANTECUBITAL  Final   Special Requests   Final    BOTTLES DRAWN AEROBIC AND ANAEROBIC Blood Culture results may not be optimal due to an excessive volume of blood received in culture bottles   Culture   Final    NO GROWTH 3 DAYS Performed at [facility], [address] [place], [place] [address]    Report Status PENDING  Incomplete  CULTURE, BLOOD (ROUTINE X 2) w Reflex to ID Panel     Status: None (Preliminary result)   Collection Time: 03/08/21  4:33 PM   Specimen: BLOOD  Result Value Ref Range Status   Specimen Description BLOOD BLOOD LEFT FOREARM  Final   Special Requests   Final    BOTTLES DRAWN AEROBIC ONLY Blood Culture results may not be optimal due to an inadequate volume of blood received in culture  bottles   Culture   Final    NO GROWTH 3 DAYS Performed at [facility], [address] [place], [place] [address]    Report Status PENDING  Incomplete  Gastrointestinal Panel by PCR , Stool     Status: None   Collection Time: 03/09/21  1:57 PM   Specimen: Stool  Result Value Ref Range Status   Campylobacter species NOT DETECTED NOT DETECTED Final   Plesimonas shigelloides NOT DETECTED NOT DETECTED Final   Salmonella species NOT DETECTED NOT DETECTED Final   Yersinia enterocolitica NOT DETECTED NOT DETECTED Final   Vibrio species NOT DETECTED NOT DETECTED Final   Vibrio cholerae NOT DETECTED NOT DETECTED Final   Enteroaggregative E coli (EAEC) NOT DETECTED NOT DETECTED Final   Enteropathogenic E coli (EPEC) NOT DETECTED NOT DETECTED Final   Enterotoxigenic E coli (ETEC) NOT DETECTED NOT DETECTED Final   Shiga like toxin producing E coli (STEC) NOT DETECTED NOT DETECTED Final   Shigella/Enteroinvasive E coli (EIEC) NOT DETECTED NOT DETECTED Final   Cryptosporidium NOT DETECTED NOT DETECTED Final   Cyclospora cayetanensis NOT DETECTED NOT DETECTED Final   Entamoeba histolytica NOT DETECTED NOT DETECTED Final   Giardia lamblia NOT DETECTED NOT DETECTED Final   Adenovirus F40/41 NOT DETECTED NOT DETECTED Final   Astrovirus NOT DETECTED NOT DETECTED Final  Norovirus GI/GII NOT DETECTED NOT DETECTED Final   Rotavirus A NOT DETECTED NOT DETECTED Final   Sapovirus (I, II, IV, and V) NOT DETECTED NOT DETECTED Final    Comment: Performed at [facility], [place], [place] [address]  C Difficile Quick Screen w PCR reflex     Status: None   Collection Time: 03/09/21  1:57 PM   Specimen: Stool  Result Value Ref Range Status   C Diff antigen NEGATIVE NEGATIVE Final   C Diff toxin NEGATIVE NEGATIVE Final   C Diff interpretation No C. difficile detected.  Final    Comment: Performed at [facility], [address], [place], [place] [address]          Radiology Studies: No results found.      Scheduled Meds:  sodium chloride   Intravenous Once   enoxaparin (LOVENOX) injection  30 mg Subcutaneous Q24H   insulin aspart  0-5 Units Subcutaneous QHS   insulin aspart  0-9 Units Subcutaneous TID WC   mouth rinse  15 mL Mouth Rinse BID   pantoprazole  40 mg Oral Daily   predniSONE  5 mg Oral Q breakfast   sodium chloride flush  3 mL Intravenous Q12H   Continuous Infusions:  0.9 % NaCl with KCl 20 mEq / L     cefTRIAXone (ROCEPHIN)  IV Stopped (03/11/21 0216)   metronidazole 500 mg (03/11/21 0423)     LOS: 3 days    Time spent: 25  minutes    Geradine Girt, DO [facility] 03/11/2021, 9:28 AM     Please page though [place] or Epic secure chat:  For [employer], Adult nurse

## 2021-03-11 NOTE — NC FL2 (Signed)
[place] LEVEL OF CARE SCREENING TOOL     IDENTIFICATION  Patient Name: Stacy [name] Birthdate: 09-May-1927 Sex: female Admission Date (Current Location): 03/07/2021  [facility] [place] and [place] Number:  Engineering geologist and Address:  [facility], [address], [place],  [address]      Provider Number: [id number]  Attending Physician Name and Address:  Geradine Girt, DO  Relative Name and Phone Number:       Current Level of Care: Hospital Recommended Level of Care: [place] Prior Approval Number:    Date Approved/Denied:   PASRR Number: [id number]  Discharge Plan: SNF    Current Diagnoses: Patient Active Problem List   Diagnosis Date Noted   Hypomagnesemia 03/08/2021   AKI (acute kidney injury) ([place]) 03/08/2021   Chronic respiratory failure with hypoxia ([place]) 03/07/2021   Acute blood loss anemia 03/07/2021   Leukocytosis 03/07/2021   Acute colitis on CT 03/07/2021   Encounter for general adult medical examination with abnormal findings 07/05/2018   Uncontrolled type 2 diabetes mellitus with hyperglycemia ([place]) 07/05/2018   Neoplasm of uncertain behavior of skin of hand 07/05/2018   Simple chronic bronchitis ([place]) 12/31/2017   Type 2 diabetes mellitus with hyperglycemia ([place]) 12/31/2017   Congestive heart failure ([place]) 10/20/2017   Primary generalized (osteo)arthritis 09/21/2017   Vitamin B12 deficiency anemia, unspecified 09/21/2017   Chronic kidney disease, unspecified 09/21/2017   Vitamin D deficiency, unspecified 09/21/2017   COPD (chronic obstructive pulmonary disease) ([place]) 09/21/2017   Unspecified hearing loss, bilateral 09/21/2017   Age-related osteoporosis without current pathological fracture 09/21/2017   Other amnesia 09/21/2017   Cough 09/21/2017   Allergic rhinitis, unspecified 09/21/2017   Type 2 diabetes mellitus without complication, without long-term current use of insulin ([place])  09/21/2017   Other seborrheic dermatitis 09/21/2017   Unspecified cervical disc disorder, mid-cervical region, unspecified level 09/21/2017   Neuromuscular dysfunction of bladder, unspecified 09/21/2017   Allergic rhinitis due to pollen 09/21/2017   Other primary ovarian failure 09/21/2017   Shortness of breath 09/21/2017   Acute bronchitis, unspecified 09/21/2017   Benign and innocent cardiac murmurs 09/21/2017   Dysuria 09/21/2017   Retention of urine, unspecified 09/21/2017   Unspecified urinary incontinence 09/21/2017   Mixed incontinence 09/21/2017   Insomnia, unspecified 09/21/2017   Presyncope secondary to symptomatic anemia 09/21/2017   Polymyalgia rheumatica ([place]) 09/21/2017   Low back pain 09/21/2017   Hypoxemia 09/21/2017   Spondylosis without myelopathy or radiculopathy, lumbosacral region 09/21/2017   Pain in unspecified knee 09/21/2017   Mixed hyperlipidemia 09/21/2017   Cardiomegaly 09/21/2017   Abnormality of gait and mobility 09/21/2017   Personal history of malignant neoplasm of breast 04/15/2013   History of breast cancer 12/24/2012   Incisional hernia, without obstruction or gangrene 12/24/2012   Essential hypertension    Vitiligo    Arthritis    Hypothyroidism    Bowel trouble     Orientation RESPIRATION BLADDER Height & Weight     Self  Normal External catheter Weight: 76.1 kg Height:     BEHAVIORAL SYMPTOMS/MOOD NEUROLOGICAL BOWEL NUTRITION STATUS      Continent Diet  AMBULATORY STATUS COMMUNICATION OF NEEDS Skin   Limited Assist Verbally Other (Comment) (ECCHYMOSIS BILATERAL ARMS/LEGS)                       Personal Care Assistance Level of Assistance  Bathing, Feeding, Dressing Bathing Assistance: Limited assistance Feeding assistance: Limited assistance Dressing Assistance:  Limited assistance     Functional Limitations Info  Sight, Hearing, Speech Sight Info: Adequate Hearing Info: Impaired (Hard of hearing) Speech Info: Adequate     SPECIAL CARE FACTORS FREQUENCY  PT (By licensed PT), OT (By licensed OT)     PT Frequency: 5X WEEK OT Frequency: 5X WEEK            Contractures Contractures Info: Not present    Additional Factors Info  Code Status, Allergies Code Status Info: Full Allergies Info: Aspirin, Augmentin, Codeine, Doxycycline, Tape, Tramadol, Vitamin D, Azithomycin, Sulfamethoxazole           Current Medications (03/11/2021):  This is the current hospital active medication list Current Facility-Administered Medications  Medication Dose Route Frequency Provider Last Rate Last Admin   0.9 %  sodium chloride infusion (Manually program via Guardrails IV Fluids)   Intravenous Once Sharion Settler, NP   Held at 03/08/21 0248   0.9 % NaCl with KCl 20 mEq/ L  infusion   Intravenous Continuous Eulogio Bear U, DO 75 mL/hr at 03/11/21 1038 New Bag at 03/11/21 1038   acetaminophen (TYLENOL) tablet 650 mg  650 mg Oral Q6H PRN Athena Masse, MD       Or   acetaminophen (TYLENOL) suppository 650 mg  650 mg Rectal Q6H PRN Athena Masse, MD       cefTRIAXone (ROCEPHIN) 1 g in sodium chloride 0.9 % 100 mL IVPB  1 g Intravenous Q24H Danford, Suann Larry, MD   Stopping Infusion hung by another clincian at 03/11/21 0216   diclofenac Sodium (VOLTAREN) 1 % topical gel 2 g  2 g Topical QID Vann, Jessica U, DO   2 g at 03/11/21 1329   enoxaparin (LOVENOX) injection 30 mg  30 mg Subcutaneous Q24H Edwin Dada, MD   30 mg at 03/10/21 2039   HYDROcodone-acetaminophen (NORCO/VICODIN) 5-325 MG per tablet 1-2 tablet  1-2 tablet Oral Q4H PRN Athena Masse, MD   1 tablet at 03/10/21 2046   insulin aspart (novoLOG) injection 0-5 Units  0-5 Units Subcutaneous QHS Judd Gaudier V, MD       insulin aspart (novoLOG) injection 0-9 Units  0-9 Units Subcutaneous TID WC Judd Gaudier V, MD   1 Units at 03/11/21 1246   lidocaine (LIDODERM) 5 % 1 patch  1 patch Transdermal Q24H Vann, Jessica U, DO   1 patch at 03/11/21  1329   MEDLINE mouth rinse  15 mL Mouth Rinse BID Danford, Suann Larry, MD   15 mL at 03/11/21 0830   metroNIDAZOLE (FLAGYL) IVPB 500 mg  500 mg Intravenous Q8H Danford, Suann Larry, MD 100 mL/hr at 03/11/21 1302 500 mg at 03/11/21 1302   ondansetron (ZOFRAN) tablet 4 mg  4 mg Oral Q6H PRN Athena Masse, MD       Or   ondansetron [facility]) injection 4 mg  4 mg Intravenous Q6H PRN Athena Masse, MD   4 mg at 03/11/21 0238   pantoprazole (PROTONIX) EC tablet 40 mg  40 mg Oral Daily Eulogio Bear U, DO   40 mg at 03/11/21 0829   polyethylene glycol (MIRALAX / GLYCOLAX) packet 17 g  17 g Oral Daily PRN Eulogio Bear U, DO       predniSONE (DELTASONE) tablet 5 mg  5 mg Oral Q breakfast Danford, Suann Larry, MD   5 mg at 03/11/21 0830   senna-docusate (Senokot-S) tablet 1 tablet  1 tablet Oral QHS PRN Athena Masse, MD  sodium chloride flush (NS) 0.9 % injection 3 mL  3 mL Intravenous Q12H Athena Masse, MD   3 mL at 03/11/21 1303     Discharge Medications: Please see discharge summary for a list of discharge medications.  Relevant Imaging Results:  Relevant Lab Results:   Additional Information SS# [id number]  Kerin Salen, RN

## 2021-03-11 NOTE — TOC Progression Note (Signed)
Transition of Care [facility]) - Progression Note    Patient Details  Name: [name] MRN: [id number] Date of Birth: 1927-07-24  Transition of Care [facility]) CM/SW [place], RN Phone Number: 03/11/2021, 4:01 PM  Clinical Narrative:  Spoke with daughter, [name] who agrees with SNF recommendation and prefer [facility] [place], FL2 completed.     Expected Discharge Plan: [place] Barriers to Discharge: Continued Medical Work up  Expected Discharge Plan and Services Expected Discharge Plan: [place]   Discharge Planning Services: CM Consult   Living arrangements for the past 2 months: Single Family Home                 DME Arranged: N/A DME Agency: NA                   Social Determinants of Health (SDOH) Interventions    Readmission Risk Interventions No flowsheet data found.

## 2021-03-11 NOTE — Progress Notes (Signed)
Patient ID: Stacy [name], female   DOB: [birth year], 87 y.o.   MRN: [id number]     [place] Hospital Day(s): 3.   Post op day(s):  Marland Kitchen   Interval History: Patient seen and examined, no acute events or new complaints overnight. Patient reports doing great because of multiple body pains including shoulders, abdomen, hips and legs.  She endorses having nausea but has been able to tolerate liquids.  There is no charting of any episode of vomiting.  I was able to appreciate the chart of 3 episode of bowel movements yesterday.  Vital signs in last 24 hours: [min-max] current  Temp:  [98 F (36.7 C)-99.3 F (37.4 C)] 98.4 F (36.9 C) (06/16 1204) Pulse Rate:  [79-91] 91 (06/16 1204) Resp:  [18-27] 21 (06/16 1204) BP: (130-146)/(68-81) 130/77 (06/16 1204) SpO2:  [97 %-100 %] 97 % (06/16 1204) Weight:  [76.1 kg] 76.1 kg (06/16 0500)       Weight: 76.1 kg BMI (Calculated): 31.72   Physical Exam:  Constitutional: alert, cooperative and no distress  Respiratory: breathing non-labored at rest  Cardiovascular: regular rate and sinus rhythm  Gastrointestinal: soft, non-tender, and non-distended  Labs:  CBC Latest Ref Rng & Units 03/11/2021 03/10/2021 03/09/2021  WBC 4.0 - 10.5 K/uL 9.7 12.3(H) 12.9(H)  Hemoglobin 12.0 - 15.0 g/dL 10.1(L) 10.6(L) 10.8(L)  Hematocrit 36.0 - 46.0 % 30.0(L) 31.3(L) 31.8(L)  Platelets 150 - 400 K/uL 127(L) 121(L) 131(L)   CMP Latest Ref Rng & Units 03/11/2021 03/10/2021 03/09/2021  Glucose 70 - 99 mg/dL 96 75 64(L)  BUN 8 - 23 mg/dL 19 25(H) 28(H)  Creatinine 0.44 - 1.00 mg/dL 1.20(H) 1.26(H) 1.53(H)  Sodium 135 - 145 mmol/L 128(L) 131(L) 134(L)  Potassium 3.5 - 5.1 mmol/L 3.2(L) 3.6 3.6  Chloride 98 - 111 mmol/L 98 101 99  CO2 22 - 32 mmol/L 24 25 29   Calcium 8.9 - 10.3 mg/dL 7.6(L) 7.8(L) 7.6(L)  Total Protein 6.5 - 8.1 g/dL - 5.1(L) -  Total Bilirubin 0.3 - 1.2 mg/dL - 0.9 -  Alkaline Phos 38 - 126 U/L - 48 -  AST 15 - 41 U/L - 16 -  ALT 0 - 44 U/L -  10 -    Imaging studies: No new pertinent imaging studies   Assessment/Plan:  87 y.o. female with abdominal pain, complicated by pertinent comorbidities including diabetes, hypertension, COPD, polymyalgia rheumatica.  Patient without any clinical changes.  No deterioration.  Her vital signs are stable.  The platelet count normalized.  Renal function continued to improve.  I am not too concerned about her "abdominal pain".  She has a basically pain everywhere in her body.  She has been tolerating diet.  I agree with advancing diet to soft diet.  From a standpoint there is no contraindication to doing physical therapy and discharging her home when medically stable.  Abdominal physical exam is reassuring, no indication for surgical management at this moment.  I will continue to follow if she remains hospitalized.  Arnold Long, MD

## 2021-03-11 NOTE — NC FL2 (Signed)
[place] LEVEL OF CARE SCREENING TOOL     IDENTIFICATION  Patient Name: Stacy [name] Birthdate: 1927-04-13 Sex: female Admission Date (Current Location): 03/07/2021  [facility] and [place] Number:  Engineering geologist and Address:  [facility], [address], [place], [place] [address]      Provider Number: [id number]  Attending Physician Name and Address:  Geradine Girt, DO  Relative Name and Phone Number:       Current Level of Care: Hospital Recommended Level of Care: [place] Prior Approval Number:    Date Approved/Denied:   PASRR Number: [id number]  Discharge Plan: SNF    Current Diagnoses: Patient Active Problem List   Diagnosis Date Noted   Hypomagnesemia 03/08/2021   AKI (acute kidney injury) ([place]) 03/08/2021   Chronic respiratory failure with hypoxia ([place]) 03/07/2021   Acute blood loss anemia 03/07/2021   Leukocytosis 03/07/2021   Acute colitis on CT 03/07/2021   Encounter for general adult medical examination with abnormal findings 07/05/2018   Uncontrolled type 2 diabetes mellitus with hyperglycemia ([place]) 07/05/2018   Neoplasm of uncertain behavior of skin of hand 07/05/2018   Simple chronic bronchitis ([place]) 12/31/2017   Type 2 diabetes mellitus with hyperglycemia ([place]) 12/31/2017   Congestive heart failure ([place]) 10/20/2017   Primary generalized (osteo)arthritis 09/21/2017   Vitamin B12 deficiency anemia, unspecified 09/21/2017   Chronic kidney disease, unspecified 09/21/2017   Vitamin D deficiency, unspecified 09/21/2017   COPD (chronic obstructive pulmonary disease) ([place]) 09/21/2017   Unspecified hearing loss, bilateral 09/21/2017   Age-related osteoporosis without current pathological fracture 09/21/2017   Other amnesia 09/21/2017   Cough 09/21/2017   Allergic rhinitis, unspecified 09/21/2017   Type 2 diabetes mellitus without complication, without long-term current use of insulin ([place])  09/21/2017   Other seborrheic dermatitis 09/21/2017   Unspecified cervical disc disorder, mid-cervical region, unspecified level 09/21/2017   Neuromuscular dysfunction of bladder, unspecified 09/21/2017   Allergic rhinitis due to pollen 09/21/2017   Other primary ovarian failure 09/21/2017   Shortness of breath 09/21/2017   Acute bronchitis, unspecified 09/21/2017   Benign and innocent cardiac murmurs 09/21/2017   Dysuria 09/21/2017   Retention of urine, unspecified 09/21/2017   Unspecified urinary incontinence 09/21/2017   Mixed incontinence 09/21/2017   Insomnia, unspecified 09/21/2017   Presyncope secondary to symptomatic anemia 09/21/2017   Polymyalgia rheumatica ([place]) 09/21/2017   Low back pain 09/21/2017   Hypoxemia 09/21/2017   Spondylosis without myelopathy or radiculopathy, lumbosacral region 09/21/2017   Pain in unspecified knee 09/21/2017   Mixed hyperlipidemia 09/21/2017   Cardiomegaly 09/21/2017   Abnormality of gait and mobility 09/21/2017   Personal history of malignant neoplasm of breast 04/15/2013   History of breast cancer 12/24/2012   Incisional hernia, without obstruction or gangrene 12/24/2012   Essential hypertension    Vitiligo    Arthritis    Hypothyroidism    Bowel trouble     Orientation RESPIRATION BLADDER Height & Weight     Self  Normal External catheter Weight: 76.1 kg Height:     BEHAVIORAL SYMPTOMS/MOOD NEUROLOGICAL BOWEL NUTRITION STATUS      Continent Diet  AMBULATORY STATUS COMMUNICATION OF NEEDS Skin   Limited Assist Verbally Other (Comment) (ECCHYMOSIS BILATERAL ARMS/LEGS)                       Personal Care Assistance Level of Assistance  Bathing, Feeding, Dressing Bathing Assistance: Limited assistance Feeding assistance: Limited assistance Dressing Assistance:  Limited assistance     Functional Limitations Info  Sight, Hearing, Speech Sight Info: Adequate Hearing Info: Impaired (Hard of hearing) Speech Info: Adequate     SPECIAL CARE FACTORS FREQUENCY  PT (By licensed PT), OT (By licensed OT)     PT Frequency: 5X WEEK OT Frequency: 5X WEEK            Contractures Contractures Info: Not present    Additional Factors Info  Code Status, Allergies Code Status Info: Full Allergies Info: Aspirin, Augmentin, Codeine, Doxycycline, Tape, Tramadol, Vitamin D, Azithomycin, Sulfamethoxazole           Current Medications (03/11/2021):  This is the current hospital active medication list Current Facility-Administered Medications  Medication Dose Route Frequency Provider Last Rate Last Admin   0.9 %  sodium chloride infusion (Manually program via Guardrails IV Fluids)   Intravenous Once Sharion Settler, NP   Held at 03/08/21 0248   0.9 % NaCl with KCl 20 mEq/ L  infusion   Intravenous Continuous Eulogio Bear U, DO 75 mL/hr at 03/11/21 1038 New Bag at 03/11/21 1038   acetaminophen (TYLENOL) tablet 650 mg  650 mg Oral Q6H PRN Athena Masse, MD       Or   acetaminophen (TYLENOL) suppository 650 mg  650 mg Rectal Q6H PRN Athena Masse, MD       cefTRIAXone (ROCEPHIN) 1 g in sodium chloride 0.9 % 100 mL IVPB  1 g Intravenous Q24H Danford, Suann Larry, MD   Stopping Infusion hung by another clincian at 03/11/21 0216   diclofenac Sodium (VOLTAREN) 1 % topical gel 2 g  2 g Topical QID Vann, Jessica U, DO   2 g at 03/11/21 1329   enoxaparin (LOVENOX) injection 30 mg  30 mg Subcutaneous Q24H Edwin Dada, MD   30 mg at 03/10/21 2039   HYDROcodone-acetaminophen (NORCO/VICODIN) 5-325 MG per tablet 1-2 tablet  1-2 tablet Oral Q4H PRN Athena Masse, MD   1 tablet at 03/10/21 2046   insulin aspart (novoLOG) injection 0-5 Units  0-5 Units Subcutaneous QHS Judd Gaudier V, MD       insulin aspart (novoLOG) injection 0-9 Units  0-9 Units Subcutaneous TID WC Judd Gaudier V, MD   1 Units at 03/11/21 1246   lidocaine (LIDODERM) 5 % 1 patch  1 patch Transdermal Q24H Vann, Jessica U, DO   1 patch at 03/11/21  1329   MEDLINE mouth rinse  15 mL Mouth Rinse BID Danford, Suann Larry, MD   15 mL at 03/11/21 0830   metroNIDAZOLE (FLAGYL) IVPB 500 mg  500 mg Intravenous Q8H Danford, Suann Larry, MD 100 mL/hr at 03/11/21 1302 500 mg at 03/11/21 1302   ondansetron (ZOFRAN) tablet 4 mg  4 mg Oral Q6H PRN Athena Masse, MD       Or   ondansetron [facility]) injection 4 mg  4 mg Intravenous Q6H PRN Athena Masse, MD   4 mg at 03/11/21 0238   pantoprazole (PROTONIX) EC tablet 40 mg  40 mg Oral Daily Eulogio Bear U, DO   40 mg at 03/11/21 0829   polyethylene glycol (MIRALAX / GLYCOLAX) packet 17 g  17 g Oral Daily PRN Eulogio Bear U, DO       predniSONE (DELTASONE) tablet 5 mg  5 mg Oral Q breakfast Danford, Suann Larry, MD   5 mg at 03/11/21 0830   senna-docusate (Senokot-S) tablet 1 tablet  1 tablet Oral QHS PRN Athena Masse, MD  sodium chloride flush (NS) 0.9 % injection 3 mL  3 mL Intravenous Q12H Athena Masse, MD   3 mL at 03/11/21 1303     Discharge Medications: Please see discharge summary for a list of discharge medications.  Relevant Imaging Results:  Relevant Lab Results:   Additional Information SS# [id number]  Kerin Salen, RN

## 2021-03-12 DIAGNOSIS — D62 Acute posthemorrhagic anemia: Secondary | ICD-10-CM | POA: Diagnosis not present

## 2021-03-12 LAB — CBC
HCT: 29.2 % — ABNORMAL LOW (ref 36.0–46.0)
Hemoglobin: 9.5 g/dL — ABNORMAL LOW (ref 12.0–15.0)
MCH: 28.1 pg (ref 26.0–34.0)
MCHC: 32.5 g/dL (ref 30.0–36.0)
MCV: 86.4 fL (ref 80.0–100.0)
Platelets: 141 10*3/uL — ABNORMAL LOW (ref 150–400)
RBC: 3.38 MIL/uL — ABNORMAL LOW (ref 3.87–5.11)
RDW: 14.8 % (ref 11.5–15.5)
WBC: 8.6 10*3/uL (ref 4.0–10.5)
nRBC: 0 % (ref 0.0–0.2)

## 2021-03-12 LAB — GLUCOSE, CAPILLARY
Glucose-Capillary: 81 mg/dL (ref 70–99)
Glucose-Capillary: 72 mg/dL (ref 70–99)
Glucose-Capillary: 109 mg/dL — ABNORMAL HIGH (ref 70–99)
Glucose-Capillary: 137 mg/dL — ABNORMAL HIGH (ref 70–99)
Glucose-Capillary: 74 mg/dL (ref 70–99)

## 2021-03-12 LAB — BASIC METABOLIC PANEL
Anion gap: 4 — ABNORMAL LOW (ref 5–15)
BUN: 15 mg/dL (ref 8–23)
CO2: 25 mmol/L (ref 22–32)
Calcium: 7.5 mg/dL — ABNORMAL LOW (ref 8.9–10.3)
Chloride: 103 mmol/L (ref 98–111)
Creatinine, Ser: 1.12 mg/dL — ABNORMAL HIGH (ref 0.44–1.00)
GFR, Estimated: 46 mL/min — ABNORMAL LOW (ref >60)
Glucose, Bld: 83 mg/dL (ref 70–99)
Potassium: 3.5 mmol/L (ref 3.5–5.1)
Sodium: 132 mmol/L — ABNORMAL LOW (ref 135–145)

## 2021-03-12 MED ORDER — MIRTAZAPINE 15 MG PO TBDP
7.5000 mg | ORAL_TABLET | Freq: Every day | ORAL | Status: DC
  Administered 2021-03-12 – 2021-03-15 (×4): 7.5 mg via ORAL
  Filled 2021-03-12 (×5): qty 0.5

## 2021-03-12 MED ORDER — ADULT MULTIVITAMIN W/MINERALS CH
1.0000 | ORAL_TABLET | Freq: Every day | ORAL | Status: DC
  Administered 2021-03-13 – 2021-03-16 (×4): 1 via ORAL
  Filled 2021-03-12 (×4): qty 1

## 2021-03-12 MED ORDER — GABAPENTIN 100 MG PO CAPS
100.0000 mg | ORAL_CAPSULE | Freq: Two times a day (BID) | ORAL | Status: DC
  Administered 2021-03-12 – 2021-03-13 (×3): 100 mg via ORAL
  Filled 2021-03-12 (×3): qty 1

## 2021-03-12 MED ORDER — ENSURE ENLIVE PO LIQD
237.0000 mL | Freq: Three times a day (TID) | ORAL | Status: DC
  Administered 2021-03-12: 21:00:00 237 mL via ORAL

## 2021-03-12 NOTE — Care Management Important Message (Signed)
Important Message  Patient Details  Name: Stacy [name] MRN: [id number] Date of Birth: September 16, 1927   Medicare Important Message Given:  Yes     [name] A Zakkiyya Barno 03/12/2021, 10:29 AM

## 2021-03-12 NOTE — TOC Progression Note (Signed)
Transition of Care [facility]) - Progression Note    Patient Details  Name: MAILEN NEWBORN MRN: [id number] Date of Birth: 1927-04-15  [facility] CM/SW Contact  Shelbie Hutching, RN Phone Number: 03/12/2021, 3:48 PM  Clinical Narrative:    [place] and [place] have no beds today but Jane[facility] Riggers said they should have beds next week.  Not medically stable for discharge today.  TOC will cont to follow.    Expected Discharge Plan: [place] Barriers to Discharge: Continued Medical Work up  Expected Discharge Plan and Services Expected Discharge Plan: [place]   Discharge Planning Services: CM Consult   Living arrangements for the past 2 months: Single Family Home                 DME Arranged: N/A DME Agency: NA                   Social Determinants of Health (SDOH) Interventions    Readmission Risk Interventions No flowsheet data found.

## 2021-03-12 NOTE — Progress Notes (Signed)
Patient ID: Stacy [name], female   DOB: [birth year], 87 y.o.   MRN: [id number]     [place] Hospital Day(s): 4.   Post op day(s):  Marland Kitchen   Interval History: Patient seen and examined, no acute events or new complaints overnight. Patient reports having good bowel movements.  She denies any changes in her multiple body pains including shoulders, abdomen, lower extremities and hips.  She continue having her chronic nausea, no vomiting.  Low back bedside without bowel meds.  No chart of episode of vomiting.  Today she was slightly to be more eager to eat something.  She had a scrambled egg but it was very difficult for her to chew and swallow because she is missing her lower denture.  Otherwise she was drinking her cranberry juice.  She was asking for Ensure.  Vital signs in last 24 hours: [min-max] current  Temp:  [98.1 F (36.7 C)-98.8 F (37.1 C)] 98.3 F (36.8 C) (06/17 0749) Pulse Rate:  [83-91] 85 (06/17 0749) Resp:  [17-21] 20 (06/17 0749) BP: (125-138)/(60-77) 138/72 (06/17 0749) SpO2:  [90 %-100 %] 100 % (06/17 0749)       Weight: 76.1 kg BMI (Calculated): 31.72   Physical Exam:  Constitutional: alert, cooperative and no distress  Respiratory: breathing non-labored at rest  Cardiovascular: regular rate and sinus rhythm  Gastrointestinal: soft, non-tender, and non-distended  Labs:  CBC Latest Ref Rng & Units 03/12/2021 03/11/2021 03/10/2021  WBC 4.0 - 10.5 K/uL 8.6 9.7 12.3(H)  Hemoglobin 12.0 - 15.0 g/dL 9.5(L) 10.1(L) 10.6(L)  Hematocrit 36.0 - 46.0 % 29.2(L) 30.0(L) 31.3(L)  Platelets 150 - 400 K/uL 141(L) 127(L) 121(L)   CMP Latest Ref Rng & Units 03/12/2021 03/11/2021 03/10/2021  Glucose 70 - 99 mg/dL 83 96 75  BUN 8 - 23 mg/dL 15 19 25(H)  Creatinine 0.44 - 1.00 mg/dL 1.12(H) 1.20(H) 1.26(H)  Sodium 135 - 145 mmol/L 132(L) 128(L) 131(L)  Potassium 3.5 - 5.1 mmol/L 3.5 3.2(L) 3.6  Chloride 98 - 111 mmol/L 103 98 101  CO2 22 - 32 mmol/L 25 24 25   Calcium 8.9 - 10.3  mg/dL 7.5(L) 7.6(L) 7.8(L)  Total Protein 6.5 - 8.1 g/dL - - 5.1(L)  Total Bilirubin 0.3 - 1.2 mg/dL - - 0.9  Alkaline Phos 38 - 126 U/L - - 48  AST 15 - 41 U/L - - 16  ALT 0 - 44 U/L - - 10    Imaging studies: No new pertinent imaging studies   Assessment/Plan:  87 y.o. female with abdominal pain, complicated by pertinent comorbidities including diabetes, hypertension, COPD, polymyalgia rheumatica.  From the colitis standpoint patient seems to be doing well.  There has been no clinical deterioration.  She has not had any fevers.  Her vital signs are stable.  Her physical exam is reassuring.  It is very difficult to say that her abdominal pain and the patient complained is from acute colitis.  I think that there is a component of chronic pain, adding the finding of colitis and adding the UTI as multifactorial issues.  Patient has been tolerating diet.  It is also difficult to understand her nausea since there has been no vomiting and she is tolerating diet.  I am going to change her diet to a pure (dysphagia 1) diet just because she does not have her lower denture and it was very difficult for her to chew and swallow the scrambled egg this morning.  Today she looks more eager  to eat but it was difficult for her because of her missing denture.  Otherwise if she gets her dentures back she can get a soft diet again.  I agree with current management for UTI.  From the surgical standpoint there is no further surgical management or work-up.  There is also no contraindication for discharge when medically stable.  No surgical follow-up needed as outpatient.  Surgery service can be contacted if there is any change in her status.  Arnold Long, MD

## 2021-03-12 NOTE — Progress Notes (Addendum)
[facility] PROGRESS NOTE    DOMINO HOLTEN  ASN:[id number] DOB: 1926-10-13 DOA: 03/07/2021 PCP: [name], MD      Brief Narrative:  Mrs. Kamm is a 87 y.o. F with PMR on Plaquenil, prednisone, hx DCIS, HTN and well controlled asthma who presented with being found down.  Patient was in [place] until few days ago when she became weaker.  Around this time noticed what appeared to be scant vaginal bleeding (blood in underwear).  Went to the ER morning of admission, VSS, pelvic ultrasound reassuring, CBC unremarkable, dishcarged home with OB-Gyn follow up for vaginal bleeding.  Went home, then was found by family a few hours later, sluggish and semi-responsive by the toilet, toilet with red blood in it, brought back to the ER.  On return to the ER, Hgb 8.4 g/dL, Cr up to 1.69. Started on fluids and admitted for observation.  Noted to have SVT in the ER, found to have mag 0.9.     Assessment & Plan:  Syncope From infection, complicated by AKI and hypomagneseamia and SVT.      UTI -ecoli on culture -IV abx until appetite improved  Abdominal pain CT abdomen on admission showed segments of colon thickening consistent with colitis.  Also today with worsening abdominal discomfort.  -GI pathogen panel normal, doubt infectious or inflammatory colitis. - seems improved, tolerating diet just does not have an appetite   AKI Daily labs  Acute blood loss anemia, suspected vaginal bleeding Hard to reconcile her Hgb here.  Baseline appears to be ~10 g.dL.  Initially here was 8.5 g/dL, then repeat was 5.3 g/dL, then after 2 units of blood, Hgb rose to 13 g/dL, confirmed with repeat.  Note: Given that sequence, SUSPECT HGB 5.3 SPURIOUS With respect to vaginal bleeding, this was discussed with OB-Gyn, her exam rules out cervical/vaginal pathology, her normal CT and pelvic US imply that this is a minor uterine issue which can be followed up with biopsy as an outpatient.  expect it to  be self-limited.   - Follow up with OB-Gyn as outpatient   Polymyalgia rheumatica - Hold Plaquenil for now given concern for infection - Continue prednisone  Diabetes, type 2 well controlled with polyneuropathy - Continue sliding scale corrections   Hypomagnesemia/hypokalemia -replete  Hyponatremia -improved off IVF    Disposition: Status is: inpt Updated son on phone 6/16-- patient has had several + COVID contacts Updated daughter 6/17 The patient will require care spanning > 2 midnights and should be moved to inpatient because:  ongoing bleeding that already required a transfusion, also ongoing IV fluids due to AKI  Dispo: The patient is from: Home              Anticipated d/c is to: SNf? Per PT              Patient currently is not medically stable to d/c.   Difficult to place patient No       Level of care: Med-Surg  DVT prophylaxis: enoxaparin (LOVENOX) injection 30 mg Start: 03/09/21 2200  Code Status: FULL               Subjective: Eating a cookie this AM as blood sugar was 71 Tolerating glucerna  Objective: Vitals:   03/11/21 1659 03/11/21 2029 03/12/21 0427 03/12/21 0749  BP: 131/75 129/60 125/74 138/72  Pulse: 83 87 89 85  Resp: 17 18 20 20   Temp: 98.5 F (36.9 C) 98.1 F (36.7 C) 98.8  F (37.1 C) 98.3 F (36.8 C)  TempSrc:  Oral Oral Oral  SpO2: 99% 90% 99% 100%  Weight:        Intake/Output Summary (Last 24 hours) at 03/12/2021 0849 Last data filed at 03/11/2021 1903 Gross per 24 hour  Intake 1130.53 ml  Output --  Net 1130.53 ml   Filed Weights   03/10/21 0500 03/11/21 0500  Weight: 74.9 kg 76.1 kg    Examination:  General: Appearance:    Elderly female in no acute distress     Lungs:     respirations unlabored  Heart:    Normal heart rate.    MS:   All extremities are intact.    Neurologic:   Awake, alert, pleasant and cooperative           Data Reviewed: I have personally reviewed following labs and  imaging studies:  CBC: Recent Labs  Lab 03/07/21 1113 03/07/21 1824 03/08/21 0051 03/08/21 0832 03/09/21 0524 03/10/21 0625 03/11/21 0524 03/12/21 0332  WBC 9.7 17.3*  --  17.6* 12.9* 12.3* 9.7 8.6  NEUTROABS 4.4 7.5  --   --   --   --   --   --   HGB 9.4* 8.5*   < > 12.5 10.8* 10.6* 10.1* 9.5*  HCT 29.6* 28.2*   < > 37.1 31.8* 31.3* 30.0* 29.2*  MCV 86.3 91.3  --  83.7 85.0 84.1 85.0 86.4  PLT 225 238  --  153 131* 121* 127* 141*   < > = values in this interval not displayed.   Basic Metabolic Panel: Recent Labs  Lab 03/08/21 0209 03/08/21 0832 03/09/21 0524 03/10/21 0625 03/11/21 0524 03/12/21 0332  NA  --  136 134* 131* 128* 132*  K  --  4.8 3.6 3.6 3.2* 3.5  CL  --  100 99 101 98 103  CO2  --  27 29 25 24 25   GLUCOSE  --  116* 64* 75 96 83  BUN  --  25* 28* 25* 19 15  CREATININE  --  1.57* 1.53* 1.26* 1.20* 1.12*  CALCIUM  --  7.8* 7.6* 7.8* 7.6* 7.5*  MG 0.9* 2.9*  --   --   --   --    GFR: Estimated Creatinine Clearance: 30.5 mL/min (A) (by C-G formula based on SCr of 1.12 mg/dL (H)). Liver Function Tests: Recent Labs  Lab 03/07/21 1113 03/10/21 0625  AST 14* 16  ALT 7 10  ALKPHOS 55 48  BILITOT 0.4 0.9  PROT 6.3* 5.1*  ALBUMIN 3.2* 2.2*   No results for input(s): LIPASE, AMYLASE in the last 168 hours. No results for input(s): AMMONIA in the last 168 hours. Coagulation Profile: No results for input(s): INR, PROTIME in the last 168 hours. Cardiac Enzymes: No results for input(s): CKTOTAL, CKMB, CKMBINDEX, TROPONINI in the last 168 hours. BNP (last 3 results) No results for input(s): PROBNP in the last 8760 hours. HbA1C: No results for input(s): HGBA1C in the last 72 hours.  CBG: Recent Labs  Lab 03/11/21 1209 03/11/21 1657 03/11/21 2033 03/12/21 0621 03/12/21 0751  GLUCAP 132* 91 119* 81 72   Lipid Profile: No results for input(s): CHOL, HDL, LDLCALC, TRIG, CHOLHDL, LDLDIRECT in the last 72 hours. Thyroid Function Tests: No results  for input(s): TSH, T4TOTAL, FREET4, T3FREE, THYROIDAB in the last 72 hours. Anemia Panel: No results for input(s): VITAMINB12, FOLATE, FERRITIN, TIBC, IRON, RETICCTPCT in the last 72 hours. Urine analysis:    Component Value Date/Time  COLORURINE AMBER (A) 03/08/2021 1613   APPEARANCEUR HAZY (A) 03/08/2021 1613   APPEARANCEUR Clear 07/05/2018 1446   LABSPEC 1.042 (H) 03/08/2021 1613   LABSPEC 1.036 04/30/2012 1618   PHURINE 5.0 03/08/2021 1613   GLUCOSEU NEGATIVE 03/08/2021 1613   GLUCOSEU Negative 04/30/2012 1618   HGBUR NEGATIVE 03/08/2021 1613   BILIRUBINUR NEGATIVE 03/08/2021 1613   BILIRUBINUR Negative 07/05/2018 1446   BILIRUBINUR Negative 04/30/2012 1618   KETONESUR 5 (A) 03/08/2021 1613   PROTEINUR NEGATIVE 03/08/2021 1613   NITRITE NEGATIVE 03/08/2021 1613   LEUKOCYTESUR MODERATE (A) 03/08/2021 1613   LEUKOCYTESUR 2+ 04/30/2012 1618    Recent Results (from the past 240 hour(s))  Resp Panel by RT-PCR (Flu A&B, Covid) Nasopharyngeal Swab     Status: None   Collection Time: 03/07/21  6:24 PM   Specimen: Nasopharyngeal Swab; Nasopharyngeal(NP) swabs in vial transport medium  Result Value Ref Range Status   SARS Coronavirus 2 by RT PCR NEGATIVE NEGATIVE Final    Comment: (NOTE) SARS-CoV-2 target nucleic acids are NOT DETECTED.  The SARS-CoV-2 RNA is generally detectable in upper respiratory specimens during the acute phase of infection. The lowest concentration of SARS-CoV-2 viral copies this assay can detect is 138 copies/mL. A negative result does not preclude SARS-Cov-2 infection and should not be used as the sole basis for treatment or other patient management decisions. A negative result may occur with  improper specimen collection/handling, submission of specimen other than nasopharyngeal swab, presence of viral mutation(s) within the areas targeted by this assay, and inadequate number of viral copies(<138 copies/mL). A negative result must be combined  with clinical observations, patient history, and epidemiological information. The expected result is Negative.  Fact Sheet for Patients:  EntrepreneurPulse.com.au  Fact Sheet for Healthcare Providers:  IncredibleEmployment.be  This test is no t yet approved or cleared by the [place] FDA and  has been authorized for detection and/or diagnosis of SARS-CoV-2 by FDA under an Emergency Use Authorization (EUA). This EUA will remain  in effect (meaning this test can be used) for the duration of the COVID-19 declaration under Section 564(b)(1) of the Act, 21 U.S.C.section 360bbb-3(b)(1), unless the authorization is terminated  or revoked sooner.       Influenza A by PCR NEGATIVE NEGATIVE Final   Influenza B by PCR NEGATIVE NEGATIVE Final    Comment: (NOTE) The Xpert Xpress SARS-CoV-2/FLU/RSV plus assay is intended as an aid in the diagnosis of influenza from Nasopharyngeal swab specimens and should not be used as a sole basis for treatment. Nasal washings and aspirates are unacceptable for Xpert Xpress SARS-CoV-2/FLU/RSV testing.  Fact Sheet for Patients: EntrepreneurPulse.com.au  Fact Sheet for Healthcare Providers: IncredibleEmployment.be  This test is not yet approved or cleared by the [place] FDA and has been authorized for detection and/or diagnosis of SARS-CoV-2 by FDA under an Emergency Use Authorization (EUA). This EUA will remain in effect (meaning this test can be used) for the duration of the COVID-19 declaration under Section 564(b)(1) of the Act, 21 U.S.C. section 360bbb-3(b)(1), unless the authorization is terminated or revoked.  Performed at [facility], [address], [place], [place] [address]   Urine Culture     Status: Abnormal   Collection Time: 03/08/21  4:14 PM   Specimen: Urine, Random  Result Value Ref Range Status   Specimen Description   Final    URINE,  RANDOM Performed at [facility], [address] [place], [place] [address]    Special Requests  Final    NONE Performed at [address] [facility], [address] [place], [place] [address]    Culture >=100,000 COLONIES/mL ESCHERICHIA COLI (A)  Final   Report Status 03/11/2021 FINAL  Final   Organism ID, Bacteria ESCHERICHIA COLI (A)  Final      Susceptibility   Escherichia coli - MIC*    AMPICILLIN >=32 RESISTANT Resistant     CEFAZOLIN <=4 SENSITIVE Sensitive     CEFEPIME <=0.12 SENSITIVE Sensitive     CEFTRIAXONE <=0.25 SENSITIVE Sensitive     CIPROFLOXACIN >=4 RESISTANT Resistant     GENTAMICIN <=1 SENSITIVE Sensitive     IMIPENEM 0.5 SENSITIVE Sensitive     NITROFURANTOIN <=16 SENSITIVE Sensitive     TRIMETH/SULFA <=20 SENSITIVE Sensitive     AMPICILLIN/SULBACTAM 16 INTERMEDIATE Intermediate     PIP/TAZO <=4 SENSITIVE Sensitive     * >=100,000 COLONIES/mL ESCHERICHIA COLI  CULTURE, BLOOD (ROUTINE X 2) w Reflex to ID Panel     Status: None (Preliminary result)   Collection Time: 03/08/21  4:26 PM   Specimen: BLOOD  Result Value Ref Range Status   Specimen Description BLOOD LEFT ANTECUBITAL  Final   Special Requests   Final    BOTTLES DRAWN AEROBIC AND ANAEROBIC Blood Culture results may not be optimal due to an excessive volume of blood received in culture bottles   Culture   Final    NO GROWTH 3 DAYS Performed at [facility], [address], [place], [place] [address]    Report Status PENDING  Incomplete  CULTURE, BLOOD (ROUTINE X 2) w Reflex to ID Panel     Status: None (Preliminary result)   Collection Time: 03/08/21  4:33 PM   Specimen: BLOOD  Result Value Ref Range Status   Specimen Description BLOOD BLOOD LEFT FOREARM  Final   Special Requests   Final    BOTTLES DRAWN AEROBIC ONLY Blood Culture results may not be optimal due to an inadequate volume of blood received in culture bottles   Culture   Final    NO GROWTH 3 DAYS Performed at  [facility], [address] [place],  [address]    Report Status PENDING  Incomplete  Gastrointestinal Panel by PCR , Stool     Status: None   Collection Time: 03/09/21  1:57 PM   Specimen: Stool  Result Value Ref Range Status   Campylobacter species NOT DETECTED NOT DETECTED Final   Plesimonas shigelloides NOT DETECTED NOT DETECTED Final   Salmonella species NOT DETECTED NOT DETECTED Final   Yersinia enterocolitica NOT DETECTED NOT DETECTED Final   Vibrio species NOT DETECTED NOT DETECTED Final   Vibrio cholerae NOT DETECTED NOT DETECTED Final   Enteroaggregative E coli (EAEC) NOT DETECTED NOT DETECTED Final   Enteropathogenic E coli (EPEC) NOT DETECTED NOT DETECTED Final   Enterotoxigenic E coli (ETEC) NOT DETECTED NOT DETECTED Final   Shiga like toxin producing E coli (STEC) NOT DETECTED NOT DETECTED Final   Shigella/Enteroinvasive E coli (EIEC) NOT DETECTED NOT DETECTED Final   Cryptosporidium NOT DETECTED NOT DETECTED Final   Cyclospora cayetanensis NOT DETECTED NOT DETECTED Final   Entamoeba histolytica NOT DETECTED NOT DETECTED Final   Giardia lamblia NOT DETECTED NOT DETECTED Final   Adenovirus F40/41 NOT DETECTED NOT DETECTED Final   Astrovirus NOT DETECTED NOT DETECTED Final   Norovirus GI/GII NOT DETECTED NOT DETECTED Final   Rotavirus A NOT DETECTED NOT DETECTED Final   Sapovirus (I, II, IV, and V)  NOT DETECTED NOT DETECTED Final    Comment: Performed at [facility], [place], [place] [address]  C Difficile Quick Screen w PCR reflex     Status: None   Collection Time: 03/09/21  1:57 PM   Specimen: Stool  Result Value Ref Range Status   C Diff antigen NEGATIVE NEGATIVE Final   C Diff toxin NEGATIVE NEGATIVE Final   C Diff interpretation No C. difficile detected.  Final    Comment: Performed at [facility], [address] [place], [place] [address]         Radiology Studies: No results  found.      Scheduled Meds:  sodium chloride   Intravenous Once   diclofenac Sodium  2 g Topical QID   enoxaparin (LOVENOX) injection  30 mg Subcutaneous Q24H   feeding supplement  237 mL Oral BID BM   insulin aspart  0-5 Units Subcutaneous QHS   insulin aspart  0-9 Units Subcutaneous TID WC   lidocaine  1 patch Transdermal Q24H   mouth rinse  15 mL Mouth Rinse BID   mirtazapine  7.5 mg Oral QHS   pantoprazole  40 mg Oral Daily   predniSONE  5 mg Oral Q breakfast   sodium chloride flush  3 mL Intravenous Q12H   Continuous Infusions:  0.9 % NaCl with KCl 20 mEq / L 75 mL/hr at 03/12/21 0134   cefTRIAXone (ROCEPHIN)  IV Stopped (03/12/21 0128)   metronidazole 500 mg (03/12/21 0424)     LOS: 4 days    Time spent: 25  minutes    Geradine Girt, DO [facility] 03/12/2021, 8:49 AM     Please page though AMION or Epic secure chat:  For [employer], Adult nurse

## 2021-03-12 NOTE — Progress Notes (Signed)
PT Cancellation Note  Patient Details Name: Stacy [name] MRN: [id number] DOB: [birth year]   Cancelled Treatment:    Reason Eval/Treat Not Completed: Pain limiting ability to participate. Patient declines OOB, states her left leg is hurting a lot. With encouragement, she agrees to bed exercises, then received phone call and proceeded to talk on phone. Will return later if time allows.     Dominyck Reser 03/12/2021, 11:02 AM

## 2021-03-12 NOTE — Progress Notes (Signed)
Initial Nutrition Assessment  DOCUMENTATION CODES:  Non-severe (moderate) malnutrition in context of acute illness/injury  INTERVENTION:  Continue current diet as ordered, encourage PO intake Nursing staff to assist with meal set-up Ensure Enlive po TID, each supplement provides 350 kcal and 20 grams of protein MVI with minerals daily  NUTRITION DIAGNOSIS:  Moderate Malnutrition (in the context of acute illness) related to poor appetite as evidenced by mild muscle depletion, mild fat depletion, energy intake < or equal to 50% for > or equal to 5 days.  GOAL:  Patient will meet greater than or equal to 90% of their needs  MONITOR:  PO intake, Supplement acceptance, Labs  REASON FOR ASSESSMENT:  Consult Assessment of nutrition requirement/status  ASSESSMENT:  87 y.o. female with a past medical history of asthma, HTN, COPD, diabetes, presented to the emergency department for bleeding.  Patient states over the past 2 days she has noticed a small amount of blood in her underwear, unsure if it is vaginal bleeding or rectal bleeding.  Pt resting in bed at the time of visit, lunch tray in front of her untouched. Pt reports that she can't eat because she is not sitting up high enough and is slouched, reached out to nursing staff to assist pt with adjusting self in bed.   Pt was changed to a puree diet this AM after having a difficult time chewing her food. Pt reports that her lower dentures are at home. Pt unsure if family would be able to bring them to her, but is ok with a puree diet for now.   Reviewed intake this admission. Averaging 25% intake with most meals being refused. Pt states she just doesn't feel like eating. States appetite is never great, but it is much better at home than here. Reports frequent nausea and bloating. Pt states she likes ensure, was added by MD this AM. Will increase to TID to promote oral intake.   Average Meal Intake: 6/12-6/17: 25% intake x 6 recorded  meals  Nutritionally Relevant Medications: Scheduled Meds:  feeding supplement  237 mL Oral BID BM   insulin aspart  0-5 Units Subcutaneous QHS   insulin aspart  0-9 Units Subcutaneous TID WC   mirtazapine  7.5 mg Oral QHS   pantoprazole  40 mg Oral Daily   predniSONE  5 mg Oral Q breakfast   Continuous Infusions:  0.9 % NaCl with KCl 20 mEq / L 75 mL/hr at 03/12/21 0134   metronidazole 500 mg (03/12/21 0424)   PRN Meds: ondansetron, polyethylene glycol, senna-docusate  Labs Reviewed: 6/16 Na 132 Creatinine 1.12 SBG ranges from 72-132 mg/dL over the last 24 hours  NUTRITION - FOCUSED PHYSICAL EXAM: Flowsheet Row Most Recent Value  Orbital Region Mild depletion  Upper Arm Region Mild depletion  Thoracic and Lumbar Region Mild depletion  Buccal Region Mild depletion  Temple Region Mild depletion  Clavicle Bone Region No depletion  Clavicle and Acromion Bone Region Mild depletion  Scapular Bone Region Mild depletion  Dorsal Hand Mild depletion  Patellar Region Moderate depletion  Anterior Thigh Region Moderate depletion  Posterior Calf Region Moderate depletion  Edema (RD Assessment) None  Hair Reviewed  Eyes Reviewed  Mouth Reviewed  Skin Reviewed  Nails Reviewed   Diet Order:   Diet Order             DIET - DYS 1 Room service appropriate? Yes; Fluid consistency: Thin  Diet effective now  EDUCATION NEEDS:  No education needs have been identified at this time  Skin:  Skin Assessment: Reviewed RN Assessment  Last BM:  6/16 - type 6  Height:  Ht Readings from Last 1 Encounters:  03/07/21 5\' 1"  (1.549 m)    Weight:  Wt Readings from Last 1 Encounters:  03/11/21 76.1 kg    Ideal Body Weight:  47.7 kg  BMI:  Body mass index is 31.7 kg/m.  Estimated Nutritional Needs:  Kcal:  1400-1600 kcal/d Protein:  70-80 g/d Fluid:  >1564mL/d   Ranell Patrick, RD, LDN Clinical Dietitian Pager on [place]

## 2021-03-13 DIAGNOSIS — E440 Moderate protein-calorie malnutrition: Secondary | ICD-10-CM | POA: Insufficient documentation

## 2021-03-13 DIAGNOSIS — E119 Type 2 diabetes mellitus without complications: Secondary | ICD-10-CM

## 2021-03-13 DIAGNOSIS — N179 Acute kidney failure, unspecified: Secondary | ICD-10-CM

## 2021-03-13 LAB — CULTURE, BLOOD (ROUTINE X 2)
Culture: NO GROWTH
Culture: NO GROWTH

## 2021-03-13 LAB — GLUCOSE, CAPILLARY
Glucose-Capillary: 183 mg/dL — ABNORMAL HIGH (ref 70–99)
Glucose-Capillary: 92 mg/dL (ref 70–99)
Glucose-Capillary: 116 mg/dL — ABNORMAL HIGH (ref 70–99)
Glucose-Capillary: 95 mg/dL (ref 70–99)

## 2021-03-13 MED ORDER — BOOST / RESOURCE BREEZE PO LIQD CUSTOM
1.0000 | Freq: Three times a day (TID) | ORAL | Status: DC
  Administered 2021-03-13 – 2021-03-16 (×10): 1 via ORAL

## 2021-03-13 MED ORDER — NYSTATIN 100000 UNIT/ML MT SUSP
5.0000 mL | Freq: Four times a day (QID) | OROMUCOSAL | Status: DC
  Administered 2021-03-13 – 2021-03-16 (×12): 500000 [IU] via ORAL
  Filled 2021-03-13 (×12): qty 5

## 2021-03-13 MED ORDER — PREDNISONE 20 MG PO TABS
20.0000 mg | ORAL_TABLET | Freq: Every day | ORAL | Status: DC
  Administered 2021-03-14 – 2021-03-16 (×3): 20 mg via ORAL
  Filled 2021-03-13 (×3): qty 1

## 2021-03-13 MED ORDER — PANTOPRAZOLE SODIUM 40 MG PO TBEC
40.0000 mg | DELAYED_RELEASE_TABLET | Freq: Two times a day (BID) | ORAL | Status: DC
  Administered 2021-03-13 – 2021-03-16 (×6): 40 mg via ORAL
  Filled 2021-03-13 (×6): qty 1

## 2021-03-13 NOTE — Evaluation (Signed)
Clinical/Bedside Swallow Evaluation Patient Details  Name: Stacy [name] MRN: [id number] Date of Birth: 16-Nov-1926  Today's Date: 03/13/2021 Time: SLP Start Time (ACUTE ONLY): 1245 SLP Stop Time (ACUTE ONLY): 4235 SLP Time Calculation (min) (ACUTE ONLY): 60 min  Past Medical History:  Past Medical History:  Diagnosis Date   Arthritis 1940   Asthma    Bowel trouble    Cancer ([place]) 2006   DCIS left breast   Hypertension 1940   Incisional hernia 2014   Personal history of radiation therapy    Polymyalgia ([place])    Thyroid disorder    Vitiligo 2012   Past Surgical History:  Past Surgical History:  Procedure Laterality Date   BREAST BIOPSY Left 2006   +   BREAST SURGERY Left 2006   lumpectomy   [place] [facility]ERY  2004   COLONOSCOPY  2014   Dr. Candace Cruise   EYE SURGERY  2002   FOOT SURGERY  1996   HERNIA REPAIR  2013   left breast biopsy   2011   HPI:  Pt is a 87 y.o. female  with past medical history of Diabetes, hypertension, hypothyroidism, COPD, chronic respiratory failure home O2 which she uses as needed, polymyalgia rheumatica on chronic low-dose prednisone admitted on 03/07/2021 with syncope, acute kidney injury, acute blood loss anemia - vaginal bleeding. CT abdomen: Large stool ball in rectum with adjacent fat stranding... Findings in colon consistent with colitis which could be inflammatory infectious or ischemic; noted pt has had multiple CT of Abd studies in past years and discomfort.  Pt was changed to a puree diet this admit after having a difficult time chewing her foods on a regular diet initially. Pt reported that her lower dentures are at home. She stated she finds the puree diet "just fine". Overall intake earlier in the admission was poor with most meals being refused. Pt stated she just doesn't feel like eating. States appetite is never great, but it is much better at home than here. Reports frequent nausea and bloating. Pt is currently on a [employer]  w/ Dietician following.  CXR: no acute abnormalities.  Unsure of pt's Baseline Cognitive status; mild confusion during this evaluation today.   Assessment / Plan / Recommendation Clinical Impression  Pt appears to present w/ grossly adequate oropharyngeal phase swallow w/ No overt oropharyngeal phase dysphagia noted, No neuromuscular deficits noted. Pt is Missing Bottom Dentition and finds solids foods "hard to chew" per report from [place]. Pt has NOT been eating much at other meals when on a Regular consistency diet -- suspect d/t the mastication effort and overall GI discomfort when feeling "full". Pt consumed po trials(puree and thins) at this evaluation w/ No overt, clinical s/s of aspiration during po trials. Pt appears at reduced risk for oropharyngeal aspiration following general aspiration precautions w/ a pureed diet consistency.   During po trials given, pt consumed consistencies w/ no overt coughing, decline in vocal quality, or change in respiratory presentation during/post trials. Oral phase appeared [facility] w/ timely bolus management and control of bolus propulsion for A-P transfer for swallowing. Oral clearing achieved w/ trial consistencies. OM Exam appeared [facility] w/ no unilateral weakness noted. Speech Clear; Mild Cognitive Confusion noted in verbal engagement w/ pt(then pt w/ NSG), and cues needed for follow through w/ tasks. Pt fed self w/ setup support.   Recommend continue a Puree consistency diet w/ well-moistened foods; Thin liquids. Recommend general aspiration precautions, and REFLUX precautions. Pills WHOLE  in Puree for safer, easier swallowing IF ANY difficulty swallowing w/ thin liquids. Education given on Pills in Puree; food consistencies and easy to eat options; general aspiration and REFLUX precautions. NSG to reconsult if any new needs arise. NSG agreed. Dietician f/u. OF NOTE, pt c/o feeling "FULL" w/in a few bites and sips of po's on tray then did not want anything more d/t  this. Suspect pt's Baseline GI issues could be impacting her overall desire for oral intake. SLP Visit Diagnosis: Dysphagia, oral phase (R13.11) (missing bottom Dentition finding it hard to masticate solids)    Aspiration Risk   (reduced following general precautions)    Diet Recommendation  Puree consistency diet for ease of intake w/ well-moistened foods; Thin liquids. Recommend general aspiration precautions, and REFLUX precautions.  Tray setup and positioning upright at meals. Dietician f/u.   Medication Administration: Whole meds with puree (as needed for ease of swallowing)    Other  Recommendations Recommended Consults: Consider GI evaluation;Consider esophageal assessment (if needed; Dietician following; Palliative Care f/u) Oral Care Recommendations: Oral care BID;Oral care before and after PO;Staff/trained caregiver to provide oral care (upper Denture care) Other Recommendations:  (n/a)   Follow up Recommendations  (potential f/u post D/C when ready to upgrade to solids again)      Frequency and Duration  (n/a)   (n/a)       Prognosis Prognosis for Safe Diet Advancement: Fair Barriers to Reach Goals: Cognitive deficits;Time post onset;Severity of deficits (GI discomfort)      Swallow Study   General Date of Onset: 03/08/21 HPI: Pt is a 87 y.o. female  with past medical history of Diabetes, hypertension, hypothyroidism, COPD, chronic respiratory failure home O2 which she uses as needed, polymyalgia rheumatica on chronic low-dose prednisone admitted on 03/07/2021 with syncope, acute kidney injury, acute blood loss anemia - vaginal bleeding. CT abdomen: Large stool ball in rectum with adjacent fat stranding... Findings in colon consistent with colitis which could be inflammatory infectious or ischemic; noted pt has had multiple CT of Abd studies in past years and discomfort.  Pt was changed to a puree diet this admit after having a difficult time chewing her foods on a regular  diet initially. Pt reported that her lower dentures are at home. She stated she finds the puree diet "just fine". Overall intake earlier in the admission was poor with most meals being refused. Pt stated she just doesn't feel like eating. States appetite is never great, but it is much better at home than here. Reports frequent nausea and bloating. Pt is currently on a [employer] w/ Dietician following.  CXR: no acute abnormalities.  Unsure of pt's Baseline Cognitive status; mild confusion during this evaluation today. Type of Study: Bedside Swallow Evaluation Previous Swallow Assessment: none Diet Prior to this Study: Dysphagia 1 (puree);Thin liquids (recently changed from regular) Temperature Spikes Noted: No (wbc 8.6) Respiratory Status: Room air History of Recent Intubation: No Behavior/Cognition: Alert;Cooperative;Pleasant mood;Confused;Distractible;Requires cueing (unsure of Baseline cogntiive status) Oral Cavity Assessment: Within Functional Limits Oral Care Completed by SLP: Yes Oral Cavity - Dentition: Dentures, top (No bottom Dentition/denture plate) Vision: Functional for self-feeding Self-Feeding Abilities: Able to feed self;Needs assist;Needs set up (needs positioning) Patient Positioning: Upright in bed (needs positioning) Baseline Vocal Quality: Normal Volitional Cough: Strong Volitional Swallow: Able to elicit    Oral/Motor/Sensory Function Overall Oral Motor/Sensory Function: Within functional limits   Ice Chips Ice chips: Not tested   Thin Liquid Thin Liquid: Within functional limits Presentation: Self  Fed;Straw;Cup Other Comments: enjoyed the [employer] - ~1/2 of it (3+ ozs)    Nectar Thick Nectar Thick Liquid: Not tested   Honey Thick Honey Thick Liquid: Not tested   Puree Puree: Within functional limits Presentation: Self Fed;Spoon (~4-5+ ozs) Other Comments: carrots w/ butter/sugar, potatoes, puree fruit   Solid     Solid: Not tested Other Comments: missing  bottom dentition        Orinda Kenner, MS, CCC-SLP Speech Language Pathologist Rehab Services [phone] [facility] 03/13/2021,2:52 PM

## 2021-03-13 NOTE — Progress Notes (Signed)
[facility] PROGRESS NOTE    Stacy [name]  BHA:[id number] DOB: Dec 04, 1926 DOA: 03/07/2021 PCP: [name], MD      Brief Narrative:  Mrs. Stacy [name] is a 87 y.o. F with PMR on Plaquenil, prednisone, hx DCIS, HTN and well controlled asthma who presented with being found down.  Patient was in [place] until few days ago when she became weaker.  Around this time noticed what appeared to be scant vaginal bleeding (blood in underwear).  Went to the ER morning of admission, VSS, pelvic ultrasound reassuring, CBC unremarkable, dishcarged home with OB-Gyn follow up for vaginal bleeding.  Went home, then was found by family a few hours later, sluggish and semi-responsive by the toilet, toilet with red blood in it, brought back to the ER.  On return to the ER, Hgb 8.4 g/dL, Cr up to 1.69. Started on fluids and admitted for observation.  Noted to have SVT in the ER, found to have mag 0.9.     Assessment & Plan:  Syncope From infection, complicated by AKI and hypomagneseamia and SVT.      UTI -ecoli on culture -IV abx until appetite improved  Abdominal pain -seems improved  AKI Daily labs  Acute blood loss anemia, suspected vaginal bleeding Hard to reconcile her Hgb here.  Baseline appears to be ~10 g.dL.  Initially here was 8.5 g/dL, then repeat was 5.3 g/dL, then after 2 units of blood, Hgb rose to 13 g/dL, confirmed with repeat.  Note: Given that sequence, SUSPECT HGB 5.3 SPURIOUS With respect to vaginal bleeding, this was discussed with OB-Gyn, her exam rules out cervical/vaginal pathology, her normal CT and pelvic US imply that this is a minor uterine issue which can be followed up with biopsy as an outpatient.  expect it to be self-limited.   - Follow up with OB-Gyn as outpatient  Polymyalgia rheumatica - Hold Plaquenil for now given concern for infection - Continue prednisone  Diabetes, type 2 well controlled with polyneuropathy - Continue sliding scale corrections    Hypomagnesemia/hypokalemia -replete  Hyponatremia -improved off IVF  FTT -unclear- multifactorial?-- infection -will start Remeron No thrush   Disposition: Status is: inpt Updated son on phone 6/16-- patient has had several + COVID contacts Updated daughter 6/17 The patient will require care spanning > 2 midnights and should be moved to inpatient because:  ongoing bleeding that already required a transfusion, also ongoing IV fluids due to AKI  Dispo: The patient is from: Home              Anticipated d/c is to: SNf? Per PT              Patient currently is not medically stable to d/c.   Difficult to place patient No       Level of care: Med-Surg  DVT prophylaxis: enoxaparin (LOVENOX) injection 30 mg Start: 03/09/21 2200  Code Status: FULL     Subjective: Multiple complaints-different from prior  Objective: Vitals:   03/13/21 0455 03/13/21 0500 03/13/21 0848 03/13/21 1137  BP: (!) 142/72  (!) 141/72 118/66  Pulse: 90  86 80  Resp: 16  15 16   Temp: 98.3 F (36.8 C)  97.9 F (36.6 C) 98.2 F (36.8 C)  TempSrc: Oral  Oral Oral  SpO2: 100%  100% 98%  Weight:  75.3 kg      Intake/Output Summary (Last 24 hours) at 03/13/2021 1315 Last data filed at 03/12/2021 1622 Gross per 24 hour  Intake --  Output 350 ml  Net -350 ml   Filed Weights   03/11/21 0500 03/12/21 1509 03/13/21 0500  Weight: 76.1 kg 70.2 kg 75.3 kg    Examination:  General: Appearance:    Elderly female in no acute distress     Lungs:     respirations unlabored  Heart:    Normal heart rate.    MS:   All extremities are intact.    Neurologic:   Awake, alert, pleasant and cooperative     Data Reviewed: I have personally reviewed following labs and imaging studies:  CBC: Recent Labs  Lab 03/07/21 1113 03/07/21 1824 03/08/21 0051 03/08/21 0832 03/09/21 0524 03/10/21 0625 03/11/21 0524 03/12/21 0332  WBC 9.7 17.3*  --  17.6* 12.9* 12.3* 9.7 8.6  NEUTROABS 4.4 7.5  --   --    --   --   --   --   HGB 9.4* 8.5*   < > 12.5 10.8* 10.6* 10.1* 9.5*  HCT 29.6* 28.2*   < > 37.1 31.8* 31.3* 30.0* 29.2*  MCV 86.3 91.3  --  83.7 85.0 84.1 85.0 86.4  PLT 225 238  --  153 131* 121* 127* 141*   < > = values in this interval not displayed.   Basic Metabolic Panel: Recent Labs  Lab 03/08/21 0209 03/08/21 0832 03/09/21 0524 03/10/21 0625 03/11/21 0524 03/12/21 0332  NA  --  136 134* 131* 128* 132*  K  --  4.8 3.6 3.6 3.2* 3.5  CL  --  100 99 101 98 103  CO2  --  27 29 25 24 25   GLUCOSE  --  116* 64* 75 96 83  BUN  --  25* 28* 25* 19 15  CREATININE  --  1.57* 1.53* 1.26* 1.20* 1.12*  CALCIUM  --  7.8* 7.6* 7.8* 7.6* 7.5*  MG 0.9* 2.9*  --   --   --   --    GFR: Estimated Creatinine Clearance: 30.4 mL/min (A) (by C-G formula based on SCr of 1.12 mg/dL (H)). Liver Function Tests: Recent Labs  Lab 03/07/21 1113 03/10/21 0625  AST 14* 16  ALT 7 10  ALKPHOS 55 48  BILITOT 0.4 0.9  PROT 6.3* 5.1*  ALBUMIN 3.2* 2.2*   No results for input(s): LIPASE, AMYLASE in the last 168 hours. No results for input(s): AMMONIA in the last 168 hours. Coagulation Profile: No results for input(s): INR, PROTIME in the last 168 hours. Cardiac Enzymes: No results for input(s): CKTOTAL, CKMB, CKMBINDEX, TROPONINI in the last 168 hours. BNP (last 3 results) No results for input(s): PROBNP in the last 8760 hours. HbA1C: No results for input(s): HGBA1C in the last 72 hours.  CBG: Recent Labs  Lab 03/12/21 1156 03/12/21 1621 03/12/21 2202 03/13/21 0846 03/13/21 1136  GLUCAP 109* 137* 74 183* 92   Lipid Profile: No results for input(s): CHOL, HDL, LDLCALC, TRIG, CHOLHDL, LDLDIRECT in the last 72 hours. Thyroid Function Tests: No results for input(s): TSH, T4TOTAL, FREET4, T3FREE, THYROIDAB in the last 72 hours. Anemia Panel: No results for input(s): VITAMINB12, FOLATE, FERRITIN, TIBC, IRON, RETICCTPCT in the last 72 hours. Urine analysis:    Component Value Date/Time    COLORURINE AMBER (A) 03/08/2021 1613   APPEARANCEUR HAZY (A) 03/08/2021 1613   APPEARANCEUR Clear 07/05/2018 1446   LABSPEC 1.042 (H) 03/08/2021 1613   LABSPEC 1.036 04/30/2012 1618   PHURINE 5.0 03/08/2021 1613   GLUCOSEU NEGATIVE 03/08/2021 1613   GLUCOSEU Negative 04/30/2012 1618  HGBUR NEGATIVE 03/08/2021 1613   BILIRUBINUR NEGATIVE 03/08/2021 1613   BILIRUBINUR Negative 07/05/2018 1446   BILIRUBINUR Negative 04/30/2012 1618   KETONESUR 5 (A) 03/08/2021 1613   PROTEINUR NEGATIVE 03/08/2021 1613   NITRITE NEGATIVE 03/08/2021 1613   LEUKOCYTESUR MODERATE (A) 03/08/2021 1613   LEUKOCYTESUR 2+ 04/30/2012 1618    Recent Results (from the past 240 hour(s))  Resp Panel by RT-PCR (Flu A&B, Covid) Nasopharyngeal Swab     Status: None   Collection Time: 03/07/21  6:24 PM   Specimen: Nasopharyngeal Swab; Nasopharyngeal(NP) swabs in vial transport medium  Result Value Ref Range Status   SARS Coronavirus 2 by RT PCR NEGATIVE NEGATIVE Final    Comment: (NOTE) SARS-CoV-2 target nucleic acids are NOT DETECTED.  The SARS-CoV-2 RNA is generally detectable in upper respiratory specimens during the acute phase of infection. The lowest concentration of SARS-CoV-2 viral copies this assay can detect is 138 copies/mL. A negative result does not preclude SARS-Cov-2 infection and should not be used as the sole basis for treatment or other patient management decisions. A negative result may occur with  improper specimen collection/handling, submission of specimen other than nasopharyngeal swab, presence of viral mutation(s) within the areas targeted by this assay, and inadequate number of viral copies(<138 copies/mL). A negative result must be combined with clinical observations, patient history, and epidemiological information. The expected result is Negative.  Fact Sheet for Patients:  EntrepreneurPulse.com.au  Fact Sheet for Healthcare Providers:   IncredibleEmployment.be  This test is no t yet approved or cleared by the [place] FDA and  has been authorized for detection and/or diagnosis of SARS-CoV-2 by FDA under an Emergency Use Authorization (EUA). This EUA will remain  in effect (meaning this test can be used) for the duration of the COVID-19 declaration under Section 564(b)(1) of the Act, 21 U.S.C.section 360bbb-3(b)(1), unless the authorization is terminated  or revoked sooner.       Influenza A by PCR NEGATIVE NEGATIVE Final   Influenza B by PCR NEGATIVE NEGATIVE Final    Comment: (NOTE) The Xpert Xpress SARS-CoV-2/FLU/RSV plus assay is intended as an aid in the diagnosis of influenza from Nasopharyngeal swab specimens and should not be used as a sole basis for treatment. Nasal washings and aspirates are unacceptable for Xpert Xpress SARS-CoV-2/FLU/RSV testing.  Fact Sheet for Patients: EntrepreneurPulse.com.au  Fact Sheet for Healthcare Providers: IncredibleEmployment.be  This test is not yet approved or cleared by the [place] FDA and has been authorized for detection and/or diagnosis of SARS-CoV-2 by FDA under an Emergency Use Authorization (EUA). This EUA will remain in effect (meaning this test can be used) for the duration of the COVID-19 declaration under Section 564(b)(1) of the Act, 21 U.S.C. section 360bbb-3(b)(1), unless the authorization is terminated or revoked.  Performed at [facility], [address], [place], [place] [address]   Urine Culture     Status: Abnormal   Collection Time: 03/08/21  4:14 PM   Specimen: Urine, Random  Result Value Ref Range Status   Specimen Description   Final    URINE, RANDOM Performed at [facility], [address] [place], [place] [address]    Special Requests   Final    NONE Performed at [facility], [address] [place], [place] [address]    Culture  >=100,000 COLONIES/mL ESCHERICHIA COLI (A)  Final   Report Status 03/11/2021 FINAL  Final   Organism ID, Bacteria ESCHERICHIA COLI (A)  Final  Susceptibility   Escherichia coli - MIC*    AMPICILLIN >=32 RESISTANT Resistant     CEFAZOLIN <=4 SENSITIVE Sensitive     CEFEPIME <=0.12 SENSITIVE Sensitive     CEFTRIAXONE <=0.25 SENSITIVE Sensitive     CIPROFLOXACIN >=4 RESISTANT Resistant     GENTAMICIN <=1 SENSITIVE Sensitive     IMIPENEM 0.5 SENSITIVE Sensitive     NITROFURANTOIN <=16 SENSITIVE Sensitive     TRIMETH/SULFA <=20 SENSITIVE Sensitive     AMPICILLIN/SULBACTAM 16 INTERMEDIATE Intermediate     PIP/TAZO <=4 SENSITIVE Sensitive     * >=100,000 COLONIES/mL ESCHERICHIA COLI  CULTURE, BLOOD (ROUTINE X 2) w Reflex to ID Panel     Status: None   Collection Time: 03/08/21  4:26 PM   Specimen: BLOOD  Result Value Ref Range Status   Specimen Description BLOOD LEFT ANTECUBITAL  Final   Special Requests   Final    BOTTLES DRAWN AEROBIC AND ANAEROBIC Blood Culture results may not be optimal due to an excessive volume of blood received in culture bottles   Culture   Final    NO GROWTH 5 DAYS Performed at [facility], [address] [place], [place] [address]    Report Status 03/13/2021 FINAL  Final  CULTURE, BLOOD (ROUTINE X 2) w Reflex to ID Panel     Status: None   Collection Time: 03/08/21  4:33 PM   Specimen: BLOOD  Result Value Ref Range Status   Specimen Description BLOOD BLOOD LEFT FOREARM  Final   Special Requests   Final    BOTTLES DRAWN AEROBIC ONLY Blood Culture results may not be optimal due to an inadequate volume of blood received in culture bottles   Culture   Final    NO GROWTH 5 DAYS Performed at [facility] [address] [place], [place] [address]    Report Status 03/13/2021 FINAL  Final  Gastrointestinal Panel by PCR , Stool     Status: None   Collection Time: 03/09/21  1:57 PM   Specimen: Stool  Result Value Ref Range Status    Campylobacter species NOT DETECTED NOT DETECTED Final   Plesimonas shigelloides NOT DETECTED NOT DETECTED Final   Salmonella species NOT DETECTED NOT DETECTED Final   Yersinia enterocolitica NOT DETECTED NOT DETECTED Final   Vibrio species NOT DETECTED NOT DETECTED Final   Vibrio cholerae NOT DETECTED NOT DETECTED Final   Enteroaggregative E coli (EAEC) NOT DETECTED NOT DETECTED Final   Enteropathogenic E coli (EPEC) NOT DETECTED NOT DETECTED Final   Enterotoxigenic E coli (ETEC) NOT DETECTED NOT DETECTED Final   Shiga like toxin producing E coli (STEC) NOT DETECTED NOT DETECTED Final   Shigella/Enteroinvasive E coli (EIEC) NOT DETECTED NOT DETECTED Final   Cryptosporidium NOT DETECTED NOT DETECTED Final   Cyclospora cayetanensis NOT DETECTED NOT DETECTED Final   Entamoeba histolytica NOT DETECTED NOT DETECTED Final   Giardia lamblia NOT DETECTED NOT DETECTED Final   Adenovirus F40/41 NOT DETECTED NOT DETECTED Final   Astrovirus NOT DETECTED NOT DETECTED Final   Norovirus GI/GII NOT DETECTED NOT DETECTED Final   Rotavirus A NOT DETECTED NOT DETECTED Final   Sapovirus (I, II, IV, and V) NOT DETECTED NOT DETECTED Final    Comment: Performed at [facility], [address] [place], [place] [address]  C Difficile Quick Screen w PCR reflex     Status: None   Collection Time: 03/09/21  1:57 PM   Specimen: Stool  Result Value Ref Range Status  C Diff antigen NEGATIVE NEGATIVE Final   C Diff toxin NEGATIVE NEGATIVE Final   C Diff interpretation No C. difficile detected.  Final    Comment: Performed at [facility], [address], [place], [place] [address]         Radiology Studies: No results found.      Scheduled Meds:  sodium chloride   Intravenous Once   diclofenac Sodium  2 g Topical QID   enoxaparin (LOVENOX) injection  30 mg Subcutaneous Q24H   feeding supplement  1 Container Oral TID BM   insulin aspart  0-5 Units Subcutaneous QHS   insulin  aspart  0-9 Units Subcutaneous TID WC   lidocaine  1 patch Transdermal Q24H   mouth rinse  15 mL Mouth Rinse BID   mirtazapine  7.5 mg Oral QHS   multivitamin with minerals  1 tablet Oral Daily   pantoprazole  40 mg Oral Daily   predniSONE  5 mg Oral Q breakfast   sodium chloride flush  3 mL Intravenous Q12H   Continuous Infusions:  0.9 % NaCl with KCl 20 mEq / L 75 mL/hr at 03/13/21 1252   cefTRIAXone (ROCEPHIN)  IV 1 g (03/12/21 1622)   metronidazole 500 mg (03/13/21 0351)     LOS: 5 days    Time spent: 25  minutes    Geradine Girt, DO [facility] 03/13/2021, 1:15 PM     Please page though AMION or Epic secure chat:  For [employer], Adult nurse

## 2021-03-14 DIAGNOSIS — D62 Acute posthemorrhagic anemia: Secondary | ICD-10-CM | POA: Diagnosis not present

## 2021-03-14 LAB — CBC
HCT: 29.7 % — ABNORMAL LOW (ref 36.0–46.0)
Hemoglobin: 9.7 g/dL — ABNORMAL LOW (ref 12.0–15.0)
MCH: 29 pg (ref 26.0–34.0)
MCHC: 32.7 g/dL (ref 30.0–36.0)
MCV: 88.9 fL (ref 80.0–100.0)
Platelets: 167 10*3/uL (ref 150–400)
RBC: 3.34 MIL/uL — ABNORMAL LOW (ref 3.87–5.11)
RDW: 15.7 % — ABNORMAL HIGH (ref 11.5–15.5)
WBC: 8.4 10*3/uL (ref 4.0–10.5)
nRBC: 0 % (ref 0.0–0.2)

## 2021-03-14 LAB — GLUCOSE, CAPILLARY
Glucose-Capillary: 73 mg/dL (ref 70–99)
Glucose-Capillary: 67 mg/dL — ABNORMAL LOW (ref 70–99)
Glucose-Capillary: 93 mg/dL (ref 70–99)
Glucose-Capillary: 155 mg/dL — ABNORMAL HIGH (ref 70–99)
Glucose-Capillary: 206 mg/dL — ABNORMAL HIGH (ref 70–99)

## 2021-03-14 LAB — BASIC METABOLIC PANEL
Anion gap: 3 — ABNORMAL LOW (ref 5–15)
BUN: 10 mg/dL (ref 8–23)
CO2: 25 mmol/L (ref 22–32)
Calcium: 7.6 mg/dL — ABNORMAL LOW (ref 8.9–10.3)
Chloride: 110 mmol/L (ref 98–111)
Creatinine, Ser: 1.07 mg/dL — ABNORMAL HIGH (ref 0.44–1.00)
GFR, Estimated: 49 mL/min — ABNORMAL LOW (ref >60)
Glucose, Bld: 84 mg/dL (ref 70–99)
Potassium: 4.2 mmol/L (ref 3.5–5.1)
Sodium: 138 mmol/L (ref 135–145)

## 2021-03-14 MED ORDER — RISAQUAD PO CAPS
2.0000 | ORAL_CAPSULE | Freq: Every day | ORAL | Status: DC
  Administered 2021-03-14 – 2021-03-16 (×3): 2 via ORAL
  Filled 2021-03-14 (×3): qty 2

## 2021-03-14 NOTE — Progress Notes (Signed)
Stacy [name] PROGRESS NOTE    Stacy [name]  VWP:[id number] DOB: November 02, 1926 DOA: 03/07/2021 PCP: [name], MD      Brief Narrative:  Stacy [name] is a 87 y.o. F with PMR on Plaquenil, prednisone, hx DCIS, HTN and well controlled asthma who presented with being found down. Found to have a UTI/colitis.  Slow to improve.       Assessment & Plan:  Syncope From infection, complicated by AKI and hypomagneseamia and SVT.      UTI -ecoli on culture -IV abx until appetite improved  Abdominal pain -seems improved  AKI Daily labs  Acute blood loss anemia, suspected vaginal bleeding Hard to reconcile her Hgb here.  Baseline appears to be ~10 g.dL.  Initially here was 8.5 g/dL, then repeat was 5.3 g/dL, then after 2 units of blood, Hgb rose to 13 g/dL, confirmed with repeat.  Note: Given that sequence, SUSPECT HGB 5.3 SPURIOUS With respect to vaginal bleeding, this was discussed with OB-Gyn, her exam rules out cervical/vaginal pathology, her normal CT and pelvic US imply that this is a minor uterine issue which can be followed up with biopsy as an outpatient.  expect it to be self-limited.   - Follow up with OB-Gyn as outpatient  Polymyalgia rheumatica - Hold Plaquenil for now given concern for infection - Continue prednisone  Diabetes, type 2 well controlled with polyneuropathy - Continue sliding scale corrections   Hypomagnesemia/hypokalemia -replete  Hyponatremia -improved off IVF  FTT -unclear- multifactorial?-- infection -will start Remeron No thrush   Disposition: Status is: inpt Granddaughter 6/18 The patient will require care spanning > 2 midnights and should be moved to inpatient because:  ongoing bleeding that already required a transfusion, also ongoing IV fluids due to AKI  Dispo: The patient is from: Home              Anticipated d/c is to: SNf? Per PT              Patient currently is not medically stable to d/c.   Difficult to place  patient No       Level of care: Med-Surg  DVT prophylaxis: enoxaparin (LOVENOX) injection 30 mg Start: 03/09/21 2200  Code Status: FULL     Subjective: Able to eat some bites this AM  Objective: Vitals:   03/14/21 0016 03/14/21 0435 03/14/21 0538 03/14/21 0742  BP: 126/73  139/66 125/61  Pulse: 90  94 92  Resp: 15  17 16   Temp: 99.4 F (37.4 C)  98.4 F (36.9 C) 98.3 F (36.8 C)  TempSrc:      SpO2: 100%  100% 96%  Weight:  76.8 kg      Intake/Output Summary (Last 24 hours) at 03/14/2021 1159 Last data filed at 03/13/2021 1300 Gross per 24 hour  Intake 2631.03 ml  Output --  Net 2631.03 ml   Filed Weights   03/12/21 1509 03/13/21 0500 03/14/21 0435  Weight: 70.2 kg 75.3 kg 76.8 kg    Examination:   General: Appearance:    Obese female in no acute distress     Lungs:     respirations unlabored  Heart:    Normal heart rate.   MS:   All extremities are intact.    Neurologic:   Awake, alert,       Data Reviewed: I have personally reviewed following labs and imaging studies:  CBC: Recent Labs  Lab 03/07/21 1824 03/08/21 0051 03/09/21 0524 03/10/21 3748 03/11/21 2707  03/12/21 0332 03/14/21 0517  WBC 17.3*   < > 12.9* 12.3* 9.7 8.6 8.4  NEUTROABS 7.5  --   --   --   --   --   --   HGB 8.5*   < > 10.8* 10.6* 10.1* 9.5* 9.7*  HCT 28.2*   < > 31.8* 31.3* 30.0* 29.2* 29.7*  MCV 91.3   < > 85.0 84.1 85.0 86.4 88.9  PLT 238   < > 131* 121* 127* 141* 167   < > = values in this interval not displayed.   Basic Metabolic Panel: Recent Labs  Lab 03/08/21 0209 03/08/21 0832 03/09/21 0524 03/10/21 0625 03/11/21 0524 03/12/21 0332 03/14/21 0517  NA  --  136 134* 131* 128* 132* 138  K  --  4.8 3.6 3.6 3.2* 3.5 4.2  CL  --  100 99 101 98 103 110  CO2  --  27 29 25 24 25 25   GLUCOSE  --  116* 64* 75 96 83 84  BUN  --  25* 28* 25* 19 15 10   CREATININE  --  1.57* 1.53* 1.26* 1.20* 1.12* 1.07*  CALCIUM  --  7.8* 7.6* 7.8* 7.6* 7.5* 7.6*  MG 0.9*  2.9*  --   --   --   --   --    GFR: Estimated Creatinine Clearance: 32.1 mL/min (A) (by C-G formula based on SCr of 1.07 mg/dL (H)). Liver Function Tests: Recent Labs  Lab 03/10/21 0625  AST 16  ALT 10  ALKPHOS 48  BILITOT 0.9  PROT 5.1*  ALBUMIN 2.2*   No results for input(s): LIPASE, AMYLASE in the last 168 hours. No results for input(s): AMMONIA in the last 168 hours. Coagulation Profile: No results for input(s): INR, PROTIME in the last 168 hours. Cardiac Enzymes: No results for input(s): CKTOTAL, CKMB, CKMBINDEX, TROPONINI in the last 168 hours. BNP (last 3 results) No results for input(s): PROBNP in the last 8760 hours. HbA1C: No results for input(s): HGBA1C in the last 72 hours.  CBG: Recent Labs  Lab 03/13/21 1136 03/13/21 1555 03/13/21 2043 03/14/21 0431 03/14/21 0735  GLUCAP 92 116* 95 73 67*   Lipid Profile: No results for input(s): CHOL, HDL, LDLCALC, TRIG, CHOLHDL, LDLDIRECT in the last 72 hours. Thyroid Function Tests: No results for input(s): TSH, T4TOTAL, FREET4, T3FREE, THYROIDAB in the last 72 hours. Anemia Panel: No results for input(s): VITAMINB12, FOLATE, FERRITIN, TIBC, IRON, RETICCTPCT in the last 72 hours. Urine analysis:    Component Value Date/Time   COLORURINE AMBER (A) 03/08/2021 1613   APPEARANCEUR HAZY (A) 03/08/2021 1613   APPEARANCEUR Clear 07/05/2018 1446   LABSPEC 1.042 (H) 03/08/2021 1613   LABSPEC 1.036 04/30/2012 1618   PHURINE 5.0 03/08/2021 1613   GLUCOSEU NEGATIVE 03/08/2021 1613   GLUCOSEU Negative 04/30/2012 1618   HGBUR NEGATIVE 03/08/2021 1613   BILIRUBINUR NEGATIVE 03/08/2021 1613   BILIRUBINUR Negative 07/05/2018 1446   BILIRUBINUR Negative 04/30/2012 1618   KETONESUR 5 (A) 03/08/2021 1613   PROTEINUR NEGATIVE 03/08/2021 1613   NITRITE NEGATIVE 03/08/2021 1613   LEUKOCYTESUR MODERATE (A) 03/08/2021 1613   LEUKOCYTESUR 2+ 04/30/2012 1618    Recent Results (from the past 240 hour(s))  Resp Panel by RT-PCR  (Flu A&B, Covid) Nasopharyngeal Swab     Status: None   Collection Time: 03/07/21  6:24 PM   Specimen: Nasopharyngeal Swab; Nasopharyngeal(NP) swabs in vial transport medium  Result Value Ref Range Status   SARS Coronavirus 2 by RT PCR  NEGATIVE NEGATIVE Final    Comment: (NOTE) SARS-CoV-2 target nucleic acids are NOT DETECTED.  The SARS-CoV-2 RNA is generally detectable in upper respiratory specimens during the acute phase of infection. The lowest concentration of SARS-CoV-2 viral copies this assay can detect is 138 copies/mL. A negative result does not preclude SARS-Cov-2 infection and should not be used as the sole basis for treatment or other patient management decisions. A negative result may occur with  improper specimen collection/handling, submission of specimen other than nasopharyngeal swab, presence of viral mutation(s) within the areas targeted by this assay, and inadequate number of viral copies(<138 copies/mL). A negative result must be combined with clinical observations, patient history, and epidemiological information. The expected result is Negative.  Fact Sheet for Patients:  EntrepreneurPulse.com.au  Fact Sheet for Healthcare Providers:  IncredibleEmployment.be  This test is no t yet approved or cleared by the [place] FDA and  has been authorized for detection and/or diagnosis of SARS-CoV-2 by FDA under an Emergency Use Authorization (EUA). This EUA will remain  in effect (meaning this test can be used) for the duration of the COVID-19 declaration under Section 564(b)(1) of the Act, 21 U.S.C.section 360bbb-3(b)(1), unless the authorization is terminated  or revoked sooner.       Influenza A by PCR NEGATIVE NEGATIVE Final   Influenza B by PCR NEGATIVE NEGATIVE Final    Comment: (NOTE) The Xpert Xpress SARS-CoV-2/FLU/RSV plus assay is intended as an aid in the diagnosis of influenza from Nasopharyngeal swab specimens  and should not be used as a sole basis for treatment. Nasal washings and aspirates are unacceptable for Xpert Xpress SARS-CoV-2/FLU/RSV testing.  Fact Sheet for Patients: EntrepreneurPulse.com.au  Fact Sheet for Healthcare Providers: IncredibleEmployment.be  This test is not yet approved or cleared by the [place] FDA and has been authorized for detection and/or diagnosis of SARS-CoV-2 by FDA under an Emergency Use Authorization (EUA). This EUA will remain in effect (meaning this test can be used) for the duration of the COVID-19 declaration under Section 564(b)(1) of the Act, 21 U.S.C. section 360bbb-3(b)(1), unless the authorization is terminated or revoked.  Performed at [facility], [address] [place], [place] [address]   Urine Culture     Status: Abnormal   Collection Time: 03/08/21  4:14 PM   Specimen: Urine, Random  Result Value Ref Range Status   Specimen Description   Final    URINE, RANDOM Performed at [facility], [address] [place], [place] [address]    Special Requests   Final    NONE Performed at [facility], [place],  [address]    Culture >=100,000 COLONIES/mL ESCHERICHIA COLI (A)  Final   Report Status 03/11/2021 FINAL  Final   Organism ID, Bacteria ESCHERICHIA COLI (A)  Final      Susceptibility   Escherichia coli - MIC*    AMPICILLIN >=32 RESISTANT Resistant     CEFAZOLIN <=4 SENSITIVE Sensitive     CEFEPIME <=0.12 SENSITIVE Sensitive     CEFTRIAXONE <=0.25 SENSITIVE Sensitive     CIPROFLOXACIN >=4 RESISTANT Resistant     GENTAMICIN <=1 SENSITIVE Sensitive     IMIPENEM 0.5 SENSITIVE Sensitive     NITROFURANTOIN <=16 SENSITIVE Sensitive     TRIMETH/SULFA <=20 SENSITIVE Sensitive     AMPICILLIN/SULBACTAM 16 INTERMEDIATE Intermediate     PIP/TAZO <=4 SENSITIVE Sensitive     * >=100,000 COLONIES/mL ESCHERICHIA COLI  CULTURE, BLOOD (ROUTINE X 2) w  Reflex to ID  Panel     Status: None   Collection Time: 03/08/21  4:26 PM   Specimen: BLOOD  Result Value Ref Range Status   Specimen Description BLOOD LEFT ANTECUBITAL  Final   Special Requests   Final    BOTTLES DRAWN AEROBIC AND ANAEROBIC Blood Culture results may not be optimal due to an excessive volume of blood received in culture bottles   Culture   Final    NO GROWTH 5 DAYS Performed at [facility], [address] [place], [place] [address]    Report Status 03/13/2021 FINAL  Final  CULTURE, BLOOD (ROUTINE X 2) w Reflex to ID Panel     Status: None   Collection Time: 03/08/21  4:33 PM   Specimen: BLOOD  Result Value Ref Range Status   Specimen Description BLOOD BLOOD LEFT FOREARM  Final   Special Requests   Final    BOTTLES DRAWN AEROBIC ONLY Blood Culture results may not be optimal due to an inadequate volume of blood received in culture bottles   Culture   Final    NO GROWTH 5 DAYS Performed at [facility], [address] [place], [place] [address]    Report Status 03/13/2021 FINAL  Final  Gastrointestinal Panel by PCR , Stool     Status: None   Collection Time: 03/09/21  1:57 PM   Specimen: Stool  Result Value Ref Range Status   Campylobacter species NOT DETECTED NOT DETECTED Final   Plesimonas shigelloides NOT DETECTED NOT DETECTED Final   Salmonella species NOT DETECTED NOT DETECTED Final   Yersinia enterocolitica NOT DETECTED NOT DETECTED Final   Vibrio species NOT DETECTED NOT DETECTED Final   Vibrio cholerae NOT DETECTED NOT DETECTED Final   Enteroaggregative E coli (EAEC) NOT DETECTED NOT DETECTED Final   Enteropathogenic E coli (EPEC) NOT DETECTED NOT DETECTED Final   Enterotoxigenic E coli (ETEC) NOT DETECTED NOT DETECTED Final   Shiga like toxin producing E coli (STEC) NOT DETECTED NOT DETECTED Final   Shigella/Enteroinvasive E coli (EIEC) NOT DETECTED NOT DETECTED Final   Cryptosporidium NOT DETECTED NOT DETECTED Final   Cyclospora  cayetanensis NOT DETECTED NOT DETECTED Final   Entamoeba histolytica NOT DETECTED NOT DETECTED Final   Giardia lamblia NOT DETECTED NOT DETECTED Final   Adenovirus F40/41 NOT DETECTED NOT DETECTED Final   Astrovirus NOT DETECTED NOT DETECTED Final   Norovirus GI/GII NOT DETECTED NOT DETECTED Final   Rotavirus A NOT DETECTED NOT DETECTED Final   Sapovirus (I, II, IV, and V) NOT DETECTED NOT DETECTED Final    Comment: Performed at [facility], [address] [place], [place] [address]  C Difficile Quick Screen w PCR reflex     Status: None   Collection Time: 03/09/21  1:57 PM   Specimen: Stool  Result Value Ref Range Status   C Diff antigen NEGATIVE NEGATIVE Final   C Diff toxin NEGATIVE NEGATIVE Final   C Diff interpretation No C. difficile detected.  Final    Comment: Performed at [facility], [address] [place], [place] [address]         Radiology Studies: No results found.      Scheduled Meds:  sodium chloride   Intravenous Once   acidophilus  2 capsule Oral Daily   diclofenac Sodium  2 g Topical QID   enoxaparin (LOVENOX) injection  30 mg Subcutaneous Q24H   feeding supplement  1 Container Oral TID BM   insulin aspart  0-5  Units Subcutaneous QHS   insulin aspart  0-9 Units Subcutaneous TID WC   lidocaine  1 patch Transdermal Q24H   mouth rinse  15 mL Mouth Rinse BID   mirtazapine  7.5 mg Oral QHS   multivitamin with minerals  1 tablet Oral Daily   nystatin  5 mL Oral QID   pantoprazole  40 mg Oral BID   predniSONE  20 mg Oral Q breakfast   sodium chloride flush  3 mL Intravenous Q12H   Continuous Infusions:  0.9 % NaCl with KCl 20 mEq / L 75 mL/hr at 03/14/21 0353   cefTRIAXone (ROCEPHIN)  IV Stopped (03/14/21 0020)   metronidazole 500 mg (03/14/21 0355)     LOS: 6 days    Time spent: 25  minutes    Geradine Girt, DO Triad [name] 03/14/2021, 11:59 AM     Please page though AMION or Epic secure chat:  For [employer], Adult nurse

## 2021-03-15 DIAGNOSIS — D62 Acute posthemorrhagic anemia: Secondary | ICD-10-CM | POA: Diagnosis not present

## 2021-03-15 LAB — GLUCOSE, CAPILLARY
Glucose-Capillary: 96 mg/dL (ref 70–99)
Glucose-Capillary: 87 mg/dL (ref 70–99)
Glucose-Capillary: 100 mg/dL — ABNORMAL HIGH (ref 70–99)
Glucose-Capillary: 191 mg/dL — ABNORMAL HIGH (ref 70–99)
Glucose-Capillary: 231 mg/dL — ABNORMAL HIGH (ref 70–99)

## 2021-03-15 NOTE — Progress Notes (Signed)
Physical Therapy Treatment Patient Details Name: Stacy [name] MRN: [id number] DOB: September 02, 1927 Today's Date: 03/15/2021    History of Present Illness Stacy [name] is a 87 y.o. female with medical history significant for Diabetes, hypertension, hypothyroidism, COPD, chronic respiratory failure home O2 which she uses as needed, polymyalgia rheumatica on chronic low-dose prednisone, who presents by EMS after she was found ' semiresponsive on the floor beside the toilet' with a large amount of blood in the toilet.  O2 sats were in the 70s on room air on arrival of EMS, requiring NRB and she was reportedly pale and diaphoretic.  Patient was seen several hours earlier in the emergency room with a 3-day history of vaginal bleeding confirmed with pelvic exam.  Vaginal ultrasound showed no abnormality.  Patient was discharged only to return in several hours later by EMS as above.    PT Comments    Patient received up on [facility] with NT present in room. Patient requires mod assist for sit to stand and to pivot back to bed. Limited by L hip pain with mobility. Patient normally can ambulate a few feet into bathroom at home. Otherwise rolls around on office chair. Patient will continue to benefit from skilled PT while here to improve mobility at ambulate a few feet as needed to return home if able.        Follow Up Recommendations  SNF     Equipment Recommendations       Recommendations for Other Services       Precautions / Restrictions Precautions Precautions: Fall Restrictions Weight Bearing Restrictions: No    Mobility  Bed Mobility Overal bed mobility: Needs Assistance Bed Mobility: Sit to Supine       Sit to supine: Min assist   General bed mobility comments: min assist to bring legs back up onto bed, Min assist to position in bed    Transfers Overall transfer level: Needs assistance Equipment used: None Transfers: Sit to/from [facility] Sit to Stand: Mod assist Stand  pivot transfers: Mod assist       General transfer comment: mod assist for pivot from bed to recliner. Mobility limited by left hip pain  Ambulation/Gait             General Gait Details: patient unable due to hip pain   Stairs             Wheelchair Mobility    Modified Rankin (Stroke Patients Only)       Balance Overall balance assessment: Needs assistance Sitting-balance support: Feet supported Sitting balance-Leahy Scale: Good     Standing balance support: Bilateral upper extremity supported;During functional activity Standing balance-Leahy Scale: Poor Standing balance comment: reliant on mod assist and B UE support for balance and safety                            Cognition Arousal/Alertness: Awake/alert Behavior During Therapy: WFL for tasks assessed/performed Overall Cognitive Status: Within Functional Limits for tasks assessed                                        Exercises Total Joint Exercises Ankle Circles/Pumps: AROM;Both;10 reps Heel Slides: AAROM;Right;10 reps Hip ABduction/ADduction: AAROM;Right;10 reps    General Comments        Pertinent Vitals/Pain Pain Assessment: Faces Faces Pain Scale: Hurts little more Pain Location:  L hip Pain Descriptors / Indicators: Grimacing;Guarding;Discomfort;Sore Pain Intervention(s): Monitored during session;Repositioned    Home Living                      Prior Function            PT Goals (current goals can now be found in the care plan section) Acute Rehab PT Goals Patient Stated Goal: tto return home PT Goal Formulation: With patient Time For Goal Achievement: 03/23/21 Potential to Achieve Goals: Fair Progress towards PT goals: Progressing toward goals    Frequency    Min 2X/week      PT Plan Current plan remains appropriate    Co-evaluation              AM-PAC PT "6 Clicks" Mobility   Outcome Measure  Help needed turning from  your back to your side while in a flat bed without using bedrails?: A Little Help needed moving from lying on your back to sitting on the side of a flat bed without using bedrails?: A Little Help needed moving to and from a bed to a chair (including a wheelchair)?: A Lot Help needed standing up from a chair using your arms (e.g., wheelchair or bedside chair)?: A Lot Help needed to walk in hospital room?: Total Help needed climbing 3-5 steps with a railing? : Total 6 Click Score: 12    End of Session   Activity Tolerance: Patient limited by pain Patient left: in bed;with call bell/phone within reach;with bed alarm set Nurse Communication: Mobility status PT Visit Diagnosis: Unsteadiness on feet (R26.81);Other abnormalities of gait and mobility (R26.89);Muscle weakness (generalized) (M62.81);Difficulty in walking, not elsewhere classified (R26.2)     Time: 4967-5916 PT Time Calculation (min) (ACUTE ONLY): 23 min  Charges:  $Therapeutic Exercise: 8-22 mins $Therapeutic Activity: 8-22 mins                     Cathern Tahir, PT, GCS 03/15/21,2:29 PM

## 2021-03-15 NOTE — Progress Notes (Signed)
[facility] PROGRESS NOTE    Stacy [name]  KCL:[id number] DOB: Sep 23, 1927 DOA: 03/07/2021 PCP: [name], MD      Brief Narrative:  Stacy [name] is a 87 y.o. F with PMR on Plaquenil, prednisone, hx DCIS, HTN and well controlled asthma who presented with being found down. Found to have a UTI/colitis.  Slow to improve.        Assessment & Plan:  Syncope From infection, complicated by AKI and hypomagneseamia and SVT.      UTI -ecoli on culture -IV abx with stop date  Abdominal pain -seems improved -treated for colitis  AKI -resolved  Acute blood loss anemia, suspected vaginal bleeding Hard to reconcile her Hgb here.  Baseline appears to be ~10 g.dL.  Initially here was 8.5 g/dL, then repeat was 5.3 g/dL, then after 2 units of blood, Hgb rose to 13 g/dL, confirmed with repeat.  Note: Given that sequence, SUSPECT HGB 5.3 SPURIOUS With respect to vaginal bleeding, this was discussed with OB-Gyn, her exam rules out cervical/vaginal pathology, her normal CT and pelvic US imply that this is a minor uterine issue which can be followed up with biopsy as an outpatient.  expect it to be self-limited.   - Follow up with OB-Gyn as outpatient  Polymyalgia rheumatica - Hold Plaquenil for now given concern for infection - Continue prednisone  Diabetes, type 2 well controlled with polyneuropathy - Continue sliding scale corrections   Hypomagnesemia/hypokalemia -repleted  Hyponatremia -improved off IVF  FTT -unclear- multifactorial?-- infection -will start Remeron No thrush seen but will do trial of nystatin swish and swallow   Disposition: Status is: inpt Daughter 6/20  The patient will require care spanning > 2 midnights and should be moved to inpatient because:  ongoing bleeding that already required a transfusion, also ongoing IV fluids due to AKI  Dispo: The patient is from: Home              Anticipated d/c is to: SNf? Per PT              Patient  currently is medically stable to d/c.   Difficult to place patient No       Level of care: Med-Surg  DVT prophylaxis: enoxaparin (LOVENOX) injection 30 mg Start: 03/09/21 2200  Code Status: FULL     Subjective: Eating better this AM  Objective: Vitals:   03/15/21 0025 03/15/21 0438 03/15/21 0500 03/15/21 0806  BP: 135/81 (!) 151/76  (!) 144/81  Pulse: 91 89  (!) 103  Resp: 16 18  18   Temp: 98.6 F (37 C) 98.3 F (36.8 C)  98.1 F (36.7 C)  TempSrc: Oral Oral    SpO2: 100% 100%  97%  Weight:   77.7 kg     Intake/Output Summary (Last 24 hours) at 03/15/2021 1043 Last data filed at 03/15/2021 1032 Gross per 24 hour  Intake 900 ml  Output 250 ml  Net 650 ml   Filed Weights   03/13/21 0500 03/14/21 0435 03/15/21 0500  Weight: 75.3 kg 76.8 kg 77.7 kg    Examination:   General: Appearance:    Obese female in no acute distress     Lungs:     Clear to auscultation bilaterally, respirations unlabored  Heart:    Tachycardic.   MS:   All extremities are intact.    Neurologic:   Awake, alert, pleasant and cooperative     Data Reviewed: I have personally reviewed following labs and imaging  studies:  CBC: Recent Labs  Lab 03/09/21 0524 03/10/21 0625 03/11/21 0524 03/12/21 0332 03/14/21 0517  WBC 12.9* 12.3* 9.7 8.6 8.4  HGB 10.8* 10.6* 10.1* 9.5* 9.7*  HCT 31.8* 31.3* 30.0* 29.2* 29.7*  MCV 85.0 84.1 85.0 86.4 88.9  PLT 131* 121* 127* 141* 323   Basic Metabolic Panel: Recent Labs  Lab 03/09/21 0524 03/10/21 0625 03/11/21 0524 03/12/21 0332 03/14/21 0517  NA 134* 131* 128* 132* 138  K 3.6 3.6 3.2* 3.5 4.2  CL 99 101 98 103 110  CO2 29 25 24 25 25   GLUCOSE 64* 75 96 83 84  BUN 28* 25* 19 15 10   CREATININE 1.53* 1.26* 1.20* 1.12* 1.07*  CALCIUM 7.6* 7.8* 7.6* 7.5* 7.6*   GFR: Estimated Creatinine Clearance: 32.3 mL/min (A) (by C-G formula based on SCr of 1.07 mg/dL (H)). Liver Function Tests: Recent Labs  Lab 03/10/21 0625  AST 16  ALT 10   ALKPHOS 48  BILITOT 0.9  PROT 5.1*  ALBUMIN 2.2*   No results for input(s): LIPASE, AMYLASE in the last 168 hours. No results for input(s): AMMONIA in the last 168 hours. Coagulation Profile: No results for input(s): INR, PROTIME in the last 168 hours. Cardiac Enzymes: No results for input(s): CKTOTAL, CKMB, CKMBINDEX, TROPONINI in the last 168 hours. BNP (last 3 results) No results for input(s): PROBNP in the last 8760 hours. HbA1C: No results for input(s): HGBA1C in the last 72 hours.  CBG: Recent Labs  Lab 03/14/21 1202 03/14/21 1712 03/14/21 2139 03/15/21 0530 03/15/21 0807  GLUCAP 93 155* 206* 96 87   Lipid Profile: No results for input(s): CHOL, HDL, LDLCALC, TRIG, CHOLHDL, LDLDIRECT in the last 72 hours. Thyroid Function Tests: No results for input(s): TSH, T4TOTAL, FREET4, T3FREE, THYROIDAB in the last 72 hours. Anemia Panel: No results for input(s): VITAMINB12, FOLATE, FERRITIN, TIBC, IRON, RETICCTPCT in the last 72 hours. Urine analysis:    Component Value Date/Time   COLORURINE AMBER (A) 03/08/2021 1613   APPEARANCEUR HAZY (A) 03/08/2021 1613   APPEARANCEUR Clear 07/05/2018 1446   LABSPEC 1.042 (H) 03/08/2021 1613   LABSPEC 1.036 04/30/2012 1618   PHURINE 5.0 03/08/2021 1613   GLUCOSEU NEGATIVE 03/08/2021 1613   GLUCOSEU Negative 04/30/2012 1618   HGBUR NEGATIVE 03/08/2021 1613   BILIRUBINUR NEGATIVE 03/08/2021 1613   BILIRUBINUR Negative 07/05/2018 1446   BILIRUBINUR Negative 04/30/2012 1618   KETONESUR 5 (A) 03/08/2021 1613   PROTEINUR NEGATIVE 03/08/2021 1613   NITRITE NEGATIVE 03/08/2021 1613   LEUKOCYTESUR MODERATE (A) 03/08/2021 1613   LEUKOCYTESUR 2+ 04/30/2012 1618    Recent Results (from the past 240 hour(s))  Resp Panel by RT-PCR (Flu A&B, Covid) Nasopharyngeal Swab     Status: None   Collection Time: 03/07/21  6:24 PM   Specimen: Nasopharyngeal Swab; Nasopharyngeal(NP) swabs in vial transport medium  Result Value Ref Range Status    SARS Coronavirus 2 by RT PCR NEGATIVE NEGATIVE Final    Comment: (NOTE) SARS-CoV-2 target nucleic acids are NOT DETECTED.  The SARS-CoV-2 RNA is generally detectable in upper respiratory specimens during the acute phase of infection. The lowest concentration of SARS-CoV-2 viral copies this assay can detect is 138 copies/mL. A negative result does not preclude SARS-Cov-2 infection and should not be used as the sole basis for treatment or other patient management decisions. A negative result may occur with  improper specimen collection/handling, submission of specimen other than nasopharyngeal swab, presence of viral mutation(s) within the areas targeted by this assay, and  inadequate number of viral copies(<138 copies/mL). A negative result must be combined with clinical observations, patient history, and epidemiological information. The expected result is Negative.  Fact Sheet for Patients:  EntrepreneurPulse.com.au  Fact Sheet for Healthcare Providers:  IncredibleEmployment.be  This test is no t yet approved or cleared by the [place] FDA and  has been authorized for detection and/or diagnosis of SARS-CoV-2 by FDA under an Emergency Use Authorization (EUA). This EUA will remain  in effect (meaning this test can be used) for the duration of the COVID-19 declaration under Section 564(b)(1) of the Act, 21 U.S.C.section 360bbb-3(b)(1), unless the authorization is terminated  or revoked sooner.       Influenza A by PCR NEGATIVE NEGATIVE Final   Influenza B by PCR NEGATIVE NEGATIVE Final    Comment: (NOTE) The Xpert Xpress SARS-CoV-2/FLU/RSV plus assay is intended as an aid in the diagnosis of influenza from Nasopharyngeal swab specimens and should not be used as a sole basis for treatment. Nasal washings and aspirates are unacceptable for Xpert Xpress SARS-CoV-2/FLU/RSV testing.  Fact Sheet for  Patients: EntrepreneurPulse.com.au  Fact Sheet for Healthcare Providers: IncredibleEmployment.be  This test is not yet approved or cleared by the [place] FDA and has been authorized for detection and/or diagnosis of SARS-CoV-2 by FDA under an Emergency Use Authorization (EUA). This EUA will remain in effect (meaning this test can be used) for the duration of the COVID-19 declaration under Section 564(b)(1) of the Act, 21 U.S.C. section 360bbb-3(b)(1), unless the authorization is terminated or revoked.  Performed at [facility], [address] [place], [place] [address]   Urine Culture     Status: Abnormal   Collection Time: 03/08/21  4:14 PM   Specimen: Urine, Random  Result Value Ref Range Status   Specimen Description   Final    URINE, RANDOM Performed at [facility], [address] [place], [place] [address]    Special Requests   Final    NONE Performed at [facility], [address] [place], [place] [address]    Culture >=100,000 COLONIES/mL ESCHERICHIA COLI (A)  Final   Report Status 03/11/2021 FINAL  Final   Organism ID, Bacteria ESCHERICHIA COLI (A)  Final      Susceptibility   Escherichia coli - MIC*    AMPICILLIN >=32 RESISTANT Resistant     CEFAZOLIN <=4 SENSITIVE Sensitive     CEFEPIME <=0.12 SENSITIVE Sensitive     CEFTRIAXONE <=0.25 SENSITIVE Sensitive     CIPROFLOXACIN >=4 RESISTANT Resistant     GENTAMICIN <=1 SENSITIVE Sensitive     IMIPENEM 0.5 SENSITIVE Sensitive     NITROFURANTOIN <=16 SENSITIVE Sensitive     TRIMETH/SULFA <=20 SENSITIVE Sensitive     AMPICILLIN/SULBACTAM 16 INTERMEDIATE Intermediate     PIP/TAZO <=4 SENSITIVE Sensitive     * >=100,000 COLONIES/mL ESCHERICHIA COLI  CULTURE, BLOOD (ROUTINE X 2) w Reflex to ID Panel     Status: None   Collection Time: 03/08/21  4:26 PM   Specimen: BLOOD  Result Value Ref Range Status   Specimen Description BLOOD LEFT  ANTECUBITAL  Final   Special Requests   Final    BOTTLES DRAWN AEROBIC AND ANAEROBIC Blood Culture results may not be optimal due to an excessive volume of blood received in culture bottles   Culture   Final    NO GROWTH 5 DAYS Performed at [facility], [address] [place], [place] [address]    Report Status 03/13/2021 FINAL  Final  CULTURE, BLOOD (ROUTINE X 2) w Reflex to ID Panel     Status: None   Collection Time: 03/08/21  4:33 PM   Specimen: BLOOD  Result Value Ref Range Status   Specimen Description BLOOD BLOOD LEFT FOREARM  Final   Special Requests   Final    BOTTLES DRAWN AEROBIC ONLY Blood Culture results may not be optimal due to an inadequate volume of blood received in culture bottles   Culture   Final    NO GROWTH 5 DAYS Performed at [facility], [address] [place], [place] [address]    Report Status 03/13/2021 FINAL  Final  Gastrointestinal Panel by PCR , Stool     Status: None   Collection Time: 03/09/21  1:57 PM   Specimen: Stool  Result Value Ref Range Status   Campylobacter species NOT DETECTED NOT DETECTED Final   Plesimonas shigelloides NOT DETECTED NOT DETECTED Final   Salmonella species NOT DETECTED NOT DETECTED Final   Yersinia enterocolitica NOT DETECTED NOT DETECTED Final   Vibrio species NOT DETECTED NOT DETECTED Final   Vibrio cholerae NOT DETECTED NOT DETECTED Final   Enteroaggregative E coli (EAEC) NOT DETECTED NOT DETECTED Final   Enteropathogenic E coli (EPEC) NOT DETECTED NOT DETECTED Final   Enterotoxigenic E coli (ETEC) NOT DETECTED NOT DETECTED Final   Shiga like toxin producing E coli (STEC) NOT DETECTED NOT DETECTED Final   Shigella/Enteroinvasive E coli (EIEC) NOT DETECTED NOT DETECTED Final   Cryptosporidium NOT DETECTED NOT DETECTED Final   Cyclospora cayetanensis NOT DETECTED NOT DETECTED Final   Entamoeba histolytica NOT DETECTED NOT DETECTED Final   Giardia lamblia NOT DETECTED NOT DETECTED Final    Adenovirus F40/41 NOT DETECTED NOT DETECTED Final   Astrovirus NOT DETECTED NOT DETECTED Final   Norovirus GI/GII NOT DETECTED NOT DETECTED Final   Rotavirus A NOT DETECTED NOT DETECTED Final   Sapovirus (I, II, IV, and V) NOT DETECTED NOT DETECTED Final    Comment: Performed at [facility], [address] [place], [place] [address]  C Difficile Quick Screen w PCR reflex     Status: None   Collection Time: 03/09/21  1:57 PM   Specimen: Stool  Result Value Ref Range Status   C Diff antigen NEGATIVE NEGATIVE Final   C Diff toxin NEGATIVE NEGATIVE Final   C Diff interpretation No C. difficile detected.  Final    Comment: Performed at [facility], [address] [place], [place] [address]         Radiology Studies: No results found.      Scheduled Meds:  sodium chloride   Intravenous Once   acidophilus  2 capsule Oral Daily   diclofenac Sodium  2 g Topical QID   enoxaparin (LOVENOX) injection  30 mg Subcutaneous Q24H   feeding supplement  1 Container Oral TID BM   insulin aspart  0-5 Units Subcutaneous QHS   insulin aspart  0-9 Units Subcutaneous TID WC   lidocaine  1 patch Transdermal Q24H   mouth rinse  15 mL Mouth Rinse BID   mirtazapine  7.5 mg Oral QHS   multivitamin with minerals  1 tablet Oral Daily   nystatin  5 mL Oral QID   pantoprazole  40 mg Oral BID   predniSONE  20 mg Oral Q breakfast   sodium chloride flush  3 mL Intravenous Q12H   Continuous Infusions:  0.9 % NaCl with KCl 20 mEq / L 75 mL/hr  at 03/15/21 0958   cefTRIAXone (ROCEPHIN)  IV 1 g (03/14/21 1748)   metronidazole 500 mg (03/15/21 0437)     LOS: 7 days    Time spent: 25  minutes    Geradine Girt, DO [facility] 03/15/2021, 10:43 AM     Please page though [place] [facility] or Epic secure chat:  For [employer], Adult nurse

## 2021-03-16 DIAGNOSIS — J45909 Unspecified asthma, uncomplicated: Secondary | ICD-10-CM | POA: Diagnosis not present

## 2021-03-16 DIAGNOSIS — M1612 Unilateral primary osteoarthritis, left hip: Secondary | ICD-10-CM | POA: Diagnosis not present

## 2021-03-16 DIAGNOSIS — M5092 Unspecified cervical disc disorder, mid-cervical region, unspecified level: Secondary | ICD-10-CM | POA: Diagnosis not present

## 2021-03-16 DIAGNOSIS — J449 Chronic obstructive pulmonary disease, unspecified: Secondary | ICD-10-CM | POA: Diagnosis not present

## 2021-03-16 DIAGNOSIS — E440 Moderate protein-calorie malnutrition: Secondary | ICD-10-CM | POA: Diagnosis not present

## 2021-03-16 DIAGNOSIS — J9601 Acute respiratory failure with hypoxia: Secondary | ICD-10-CM | POA: Diagnosis not present

## 2021-03-16 DIAGNOSIS — H9190 Unspecified hearing loss, unspecified ear: Secondary | ICD-10-CM | POA: Diagnosis not present

## 2021-03-16 DIAGNOSIS — K219 Gastro-esophageal reflux disease without esophagitis: Secondary | ICD-10-CM | POA: Diagnosis not present

## 2021-03-16 DIAGNOSIS — E1122 Type 2 diabetes mellitus with diabetic chronic kidney disease: Secondary | ICD-10-CM | POA: Diagnosis not present

## 2021-03-16 DIAGNOSIS — N189 Chronic kidney disease, unspecified: Secondary | ICD-10-CM | POA: Diagnosis not present

## 2021-03-16 DIAGNOSIS — I130 Hypertensive heart and chronic kidney disease with heart failure and stage 1 through stage 4 chronic kidney disease, or unspecified chronic kidney disease: Secondary | ICD-10-CM | POA: Diagnosis not present

## 2021-03-16 DIAGNOSIS — E1140 Type 2 diabetes mellitus with diabetic neuropathy, unspecified: Secondary | ICD-10-CM | POA: Diagnosis not present

## 2021-03-16 DIAGNOSIS — I471 Supraventricular tachycardia: Secondary | ICD-10-CM | POA: Diagnosis not present

## 2021-03-16 DIAGNOSIS — R531 Weakness: Secondary | ICD-10-CM | POA: Diagnosis not present

## 2021-03-16 DIAGNOSIS — I509 Heart failure, unspecified: Secondary | ICD-10-CM | POA: Diagnosis not present

## 2021-03-16 DIAGNOSIS — Z7952 Long term (current) use of systemic steroids: Secondary | ICD-10-CM | POA: Diagnosis not present

## 2021-03-16 DIAGNOSIS — E039 Hypothyroidism, unspecified: Secondary | ICD-10-CM | POA: Diagnosis not present

## 2021-03-16 DIAGNOSIS — M353 Polymyalgia rheumatica: Secondary | ICD-10-CM | POA: Diagnosis not present

## 2021-03-16 DIAGNOSIS — E118 Type 2 diabetes mellitus with unspecified complications: Secondary | ICD-10-CM | POA: Diagnosis not present

## 2021-03-16 DIAGNOSIS — R1311 Dysphagia, oral phase: Secondary | ICD-10-CM | POA: Diagnosis not present

## 2021-03-16 DIAGNOSIS — E876 Hypokalemia: Secondary | ICD-10-CM | POA: Diagnosis not present

## 2021-03-16 DIAGNOSIS — N939 Abnormal uterine and vaginal bleeding, unspecified: Secondary | ICD-10-CM | POA: Diagnosis not present

## 2021-03-16 DIAGNOSIS — K5900 Constipation, unspecified: Secondary | ICD-10-CM | POA: Diagnosis not present

## 2021-03-16 DIAGNOSIS — D62 Acute posthemorrhagic anemia: Secondary | ICD-10-CM | POA: Diagnosis not present

## 2021-03-16 DIAGNOSIS — R55 Syncope and collapse: Secondary | ICD-10-CM | POA: Diagnosis not present

## 2021-03-16 DIAGNOSIS — K529 Noninfective gastroenteritis and colitis, unspecified: Secondary | ICD-10-CM | POA: Diagnosis not present

## 2021-03-16 DIAGNOSIS — D649 Anemia, unspecified: Secondary | ICD-10-CM | POA: Diagnosis not present

## 2021-03-16 DIAGNOSIS — R5381 Other malaise: Secondary | ICD-10-CM | POA: Diagnosis not present

## 2021-03-16 LAB — CREATININE, SERUM
Creatinine, Ser: 1 mg/dL (ref 0.44–1.00)
GFR, Estimated: 53 mL/min — ABNORMAL LOW (ref >60)

## 2021-03-16 LAB — GLUCOSE, CAPILLARY
Glucose-Capillary: 96 mg/dL (ref 70–99)
Glucose-Capillary: 85 mg/dL (ref 70–99)
Glucose-Capillary: 123 mg/dL — ABNORMAL HIGH (ref 70–99)

## 2021-03-16 LAB — RESP PANEL BY RT-PCR (FLU A&B, COVID) ARPGX2
Influenza A by PCR: NEGATIVE
Influenza B by PCR: NEGATIVE
SARS Coronavirus 2 by RT PCR: NEGATIVE

## 2021-03-16 MED ORDER — MIRTAZAPINE 15 MG PO TBDP: 7.5000 mg | ORAL_TABLET | Freq: Every day | ORAL | Status: DC

## 2021-03-16 MED ORDER — LIDOCAINE 5 % EX PTCH
1.0000 | MEDICATED_PATCH | CUTANEOUS | 0 refills | Status: DC
Start: 2021-03-16 — End: 2021-04-15

## 2021-03-16 MED ORDER — COVID-19 MRNA VAC-TRIS(PFIZER) 30 MCG/0.3ML IM SUSP
0.3000 mL | Freq: Once | INTRAMUSCULAR | Status: AC
  Administered 2021-03-16: 0.3 mL via INTRAMUSCULAR
  Filled 2021-03-16: qty 0.3

## 2021-03-16 MED ORDER — POLYETHYLENE GLYCOL 3350 17 G PO PACK
17.0000 g | PACK | Freq: Every day | ORAL | 0 refills | Status: DC | PRN
Start: 2021-03-16 — End: 2021-04-15

## 2021-03-16 MED ORDER — NYSTATIN 100000 UNIT/ML MT SUSP: 5.0000 mL | Freq: Four times a day (QID) | OROMUCOSAL | 0 refills | Status: DC

## 2021-03-16 MED ORDER — ADULT MULTIVITAMIN W/MINERALS CH: 1.0000 | ORAL_TABLET | Freq: Every day | ORAL | Status: DC

## 2021-03-16 MED ORDER — PREDNISONE 20 MG PO TABS: 20.0000 mg | ORAL_TABLET | Freq: Every day | ORAL | Status: DC

## 2021-03-16 MED ORDER — PANTOPRAZOLE SODIUM 40 MG PO TBEC: 40.0000 mg | DELAYED_RELEASE_TABLET | Freq: Two times a day (BID) | ORAL | Status: DC

## 2021-03-16 MED ORDER — DICLOFENAC SODIUM 1 % EX GEL: 2.0000 g | Freq: Four times a day (QID) | CUTANEOUS | Status: DC

## 2021-03-16 MED ORDER — RISAQUAD PO CAPS: 2.0000 | ORAL_CAPSULE | Freq: Every day | ORAL | Status: DC

## 2021-03-16 NOTE — Care Management Important Message (Signed)
Important Message  Patient Details  Name: Stacy [name] MRN: [id number] Date of Birth: [birth year]   Medicare Important Message Given:  Yes     [name] [name] 03/16/2021, 10:35 AM

## 2021-03-16 NOTE — Discharge Summary (Signed)
Physician Discharge Summary  Stacy [name] DXA:[id number] DOB: 03/31/1927 DOA: 03/07/2021  PCP: [name], MD  Admit date: 03/07/2021 Discharge date: 03/16/2021  Admitted From: home Discharge disposition: SNF   Recommendations for Outpatient Follow-Up:   palliative care outpatient follow up Nystatin oral for 1 week Outpatient GYN follow up  Boost breeze between meals Aggressive bowel regimen   Discharge Diagnosis:   Principal Problem:   Acute blood loss anemia Active Problems:   Essential hypertension   Hypothyroidism   COPD (chronic obstructive pulmonary disease) ([place])   Type 2 diabetes mellitus without complication, without long-term current use of insulin (HCC)   Presyncope secondary to symptomatic anemia   Polymyalgia rheumatica (HCC)   Chronic respiratory failure with hypoxia (HCC)   Leukocytosis   Acute colitis on CT   Hypomagnesemia   AKI (acute kidney injury) ([place])   Malnutrition of moderate degree    Discharge Condition: Improved.  Diet recommendation: DYS 1- advance as tolerated  Wound care: None.  Code status: Full.   History of Present Illness:   Stacy [name] is a 87 y.o. female with medical history significant for Diabetes, hypertension, hypothyroidism, COPD, chronic respiratory failure home O2 which she uses as needed, polymyalgia rheumatica on chronic low-dose prednisone, who presents by EMS after she was found ' semiresponsive on the floor beside the toilet' with a large amount of blood in the toilet.  O2 sats were in the 70s on room air on arrival of EMS, requiring NRB and she was reportedly pale and diaphoretic.  Patient was seen several hours earlier in the emergency room with a 3-day history of vaginal bleeding confirmed with pelvic exam.  Vaginal ultrasound showed no abnormality.  Patient was discharged only to return in several hours later by EMS as above.  By arrival in the ED she was alert. Patient states she had been having  intermittent bleeding but on the day of arrival the bleeding became heavier and was constant for about 15 minutes.  States she did not fall but is herself off the commode onto the floor when she started feeling dizzy. She denied abdominal pain, fever or chills, chest pain or shortness of breath or cough.  Denied diarrhea or constipation.   ED course: On arrival, afebrile, BP 105/88 with pulse of 95, tachypneic at 24 with O2 sat 100% on NRB.  Blood work revealed hemoglobin of 8.5, down from 9.4 eight hours earlier, last baseline for comparison was around 10.4 from a year ago.  WBC 17,000, up from 9700 earlier in the day.  Creatinine 1.69.  Rectal exam revealed brown stool that was guaiac negative   Hospital Course by Problem:    Syncope From infection, complicated by AKI and hypomagneseamia and SVT.       UTI -ecoli on culture -IV finished course   Abdominal pain -seems improved -treated for colitis -lactobacillus   AKI -resolved   Acute blood loss anemia, suspected vaginal bleeding Hard to reconcile her Hgb here.  Baseline appears to be ~10 g.dL.  Initially here was 8.5 g/dL, then repeat was 5.3 g/dL, then after 2 units of blood, Hgb rose to 13 g/dL, confirmed with repeat.  Note: Given that sequence, SUSPECT HGB 5.3 SPURIOUS With respect to vaginal bleeding, this was discussed with OB-Gyn, her exam rules out cervical/vaginal pathology, her normal CT and pelvic US imply that this is a minor uterine issue which can be followed up with biopsy as an outpatient.  expect it  to be self-limited.   - Follow up with OB-Gyn as outpatient   Polymyalgia rheumatica - Hold Plaquenil for now given concern for infection - Continue prednisone   Diabetes, type 2 well controlled with polyneuropathy - Continue sliding scale corrections   Hypomagnesemia/hypokalemia -repleted   Hyponatremia -improved off IVF   FTT -unclear- multifactorial?-- infection -will start Remeron No thrush seen but will  do trial of nystatin swish and swallow     Medical Consultants:   [facility] Palliative care   Discharge Exam:   Vitals:   03/16/21 0329 03/16/21 0804  BP: 139/80 (!) 141/79  Pulse: 85 99  Resp: 19 16  Temp: 97.9 F (36.6 C) 98.2 F (36.8 C)  SpO2: 100% 100%   Vitals:   03/15/21 2341 03/16/21 0329 03/16/21 0406 03/16/21 0804  BP: 129/64 139/80  (!) 141/79  Pulse: (!) 101 85  99  Resp: 17 19  16   Temp: 98.3 F (36.8 C) 97.9 F (36.6 C)  98.2 F (36.8 C)  TempSrc:      SpO2: 100% 100%  100%  Weight:   78.1 kg     General exam: Appears calm and comfortable.    The results of significant diagnostics from this hospitalization (including imaging, microbiology, ancillary and laboratory) are listed below for reference.     Procedures and Diagnostic Studies:   US Pelvis Complete  Result Date: 03/07/2021 CLINICAL DATA:  Vaginal bleeding EXAM: TRANSABDOMINAL ULTRASOUND OF PELVIS TECHNIQUE: Transabdominal ultrasound examination of the pelvis was performed including evaluation of the uterus, ovaries, adnexal regions, and pelvic cul-de-sac. COMPARISON:  Ultrasound 12/05/2018 FINDINGS: Uterus Measurements: 6.4 x 1.7 x 3.0 = volume: 17 mL. No fibroids or other mass visualized. Endometrium Thickness: 2.5 mm.  No focal abnormality visualized. Right ovary Measurements: Nonvisualized Left ovary Measurements: Not visualized. Other findings:  No abnormal free fluid. IMPRESSION: Unremarkable sonographic appearance of the uterus. No endometrial thickening or mass visible on transabdominal exam. Nonvisualization of the ovaries. Electronically Signed   By: Maurine Simmering   On: 03/07/2021 12:38   CT ABDOMEN PELVIS W CONTRAST  Result Date: 03/07/2021 CLINICAL DATA:  Vaginal and rectal bleeding. Patient found semi responsive. Blood in toilet. EXAM: CT ABDOMEN AND PELVIS WITH CONTRAST TECHNIQUE: Multidetector CT imaging of the abdomen and pelvis was performed using the standard protocol following bolus  administration of intravenous contrast. CONTRAST:  133mL OMNIPAQUE IOHEXOL 300 MG/ML  SOLN COMPARISON:  November 06, 2012 FINDINGS: Lower chest: Small hiatal hernia. No other abnormalities in the lung bases are lower chest. Hepatobiliary: The patient is status post cholecystectomy. Mild intra and extrahepatic biliary duct dilatation is unchanged. Portal vein is patent. Pancreas: Unremarkable. No pancreatic ductal dilatation or surrounding inflammatory changes. Spleen: Normal in size without focal abnormality. Adrenals/Urinary Tract: The adrenal glands, kidneys, and ureters are normal. The bladder is poorly distended limiting evaluation. Bladder wall thickening not excluded. Stomach/Bowel: The stomach and small bowel are normal. There is a large stool ball in the rectum without perirectal fat stranding or pneumatosis. The stool ball measures at least 7.8 x 7.4 x 6.5 cm. An anastomotic ring is identified in the region of the sigmoid colon. There is wall thickening associated with the colon. The distal half of the transverse colon and the descending colon are continuously affected. The cecum demonstrates some wall thickening and increased mucosal enhancement as seen on coronal image 29 and axial image 45 suggesting involvement of the cecum as well. There is sparing of most of the ascending colon and  the proximal half of the transverse colon. There is mild pericolonic stranding in some locations, particularly adjacent to the descending colon. The appendix is not visualized but there is no secondary evidence of appendicitis. Vascular/Lymphatic: Calcified atherosclerosis is seen in the nonaneurysmal aorta, extending into the iliac and femoral vessels. No dissection. No adenopathy. Reproductive: Uterus and bilateral adnexa are unremarkable. Other: There is a ventral hernia in the pelvis containing loops of small bowel without obstruction. This finding is unchanged since 2014. There is a small ventral hernia anteriorly best  seen on sagittal image 88. There is some enhancing soft tissue nodularity surrounding this hernia well seen on sagittal image 86. This finding is new since 2014. Musculoskeletal: No acute or significant osseous findings. IMPRESSION: 1. There is a large stool ball in the rectum measuring 7.8 x 7.4 x 6.5 cm without wall thickening, pneumatosis, or adjacent fat stranding. 2. Wall thickening associated with the cecum, the distal half of the transverse colon, and the descending colon with some regions of pericolonic fat stranding. This finding is consistent with colitis which could be inflammatory, infectious, or ischemic. 3. The bladder is poorly distended limiting evaluation but nonspecific wall thickening is not excluded. 4. There is a small ventral hernia in the upper abdomen at midline containing fat. There is enhancing soft tissue nodularity surrounding this hernia not seen in 2014. This soft tissue nodularity could be inflammatory, neoplastic, or less likely infectious. 5. Calcified atherosclerosis in the nonaneurysmal aorta. Electronically Signed   By: Dorise Bullion III M.D   On: 03/07/2021 20:05   Portable Chest 1 View  Result Date: 03/07/2021 CLINICAL DATA:  Syncope. EXAM: PORTABLE CHEST 1 VIEW COMPARISON:  May 12, 2015 FINDINGS: No pneumothorax. The heart size borderline to mildly enlarged. The hila and mediastinum are unremarkable. No focal infiltrates. Mild atelectasis in the bases. No other acute abnormalities. IMPRESSION: No acute abnormalities. Electronically Signed   By: Dorise Bullion III M.D   On: 03/07/2021 21:44     Labs:   Basic Metabolic Panel: Recent Labs  Lab 03/10/21 6063 03/11/21 0524 03/12/21 0332 03/14/21 0517 03/16/21 0454  NA 131* 128* 132* 138  --   K 3.6 3.2* 3.5 4.2  --   CL 101 98 103 110  --   CO2 25 24 25 25   --   GLUCOSE 75 96 83 84  --   BUN 25* 19 15 10   --   CREATININE 1.26* 1.20* 1.12* 1.07* 1.00  CALCIUM 7.8* 7.6* 7.5* 7.6*  --    GFR Estimated  Creatinine Clearance: 34.7 mL/min (by C-G formula based on SCr of 1 mg/dL). Liver Function Tests: Recent Labs  Lab 03/10/21 0625  AST 16  ALT 10  ALKPHOS 48  BILITOT 0.9  PROT 5.1*  ALBUMIN 2.2*   No results for input(s): LIPASE, AMYLASE in the last 168 hours. No results for input(s): AMMONIA in the last 168 hours. Coagulation profile No results for input(s): INR, PROTIME in the last 168 hours.  CBC: Recent Labs  Lab 03/10/21 0625 03/11/21 0524 03/12/21 0332 03/14/21 0517  WBC 12.3* 9.7 8.6 8.4  HGB 10.6* 10.1* 9.5* 9.7*  HCT 31.3* 30.0* 29.2* 29.7*  MCV 84.1 85.0 86.4 88.9  PLT 121* 127* 141* 167   Cardiac Enzymes: No results for input(s): CKTOTAL, CKMB, CKMBINDEX, TROPONINI in the last 168 hours. BNP: Invalid input(s): POCBNP CBG: Recent Labs  Lab 03/15/21 1135 03/15/21 1711 03/15/21 2108 03/16/21 0628 03/16/21 0802  GLUCAP 100* 191* 231* 96 85  D-Dimer No results for input(s): DDIMER in the last 72 hours. Hgb A1c No results for input(s): HGBA1C in the last 72 hours. Lipid Profile No results for input(s): CHOL, HDL, LDLCALC, TRIG, CHOLHDL, LDLDIRECT in the last 72 hours. Thyroid function studies No results for input(s): TSH, T4TOTAL, T3FREE, THYROIDAB in the last 72 hours.  Invalid input(s): FREET3 Anemia work up No results for input(s): VITAMINB12, FOLATE, FERRITIN, TIBC, IRON, RETICCTPCT in the last 72 hours. Microbiology Recent Results (from the past 240 hour(s))  Resp Panel by RT-PCR (Flu A&B, Covid) Nasopharyngeal Swab     Status: None   Collection Time: 03/07/21  6:24 PM   Specimen: Nasopharyngeal Swab; Nasopharyngeal(NP) swabs in vial transport medium  Result Value Ref Range Status   SARS Coronavirus 2 by RT PCR NEGATIVE NEGATIVE Final    Comment: (NOTE) SARS-CoV-2 target nucleic acids are NOT DETECTED.  The SARS-CoV-2 RNA is generally detectable in upper respiratory specimens during the acute phase of infection. The lowest concentration  of SARS-CoV-2 viral copies this assay can detect is 138 copies/mL. A negative result does not preclude SARS-Cov-2 infection and should not be used as the sole basis for treatment or other patient management decisions. A negative result may occur with  improper specimen collection/handling, submission of specimen other than nasopharyngeal swab, presence of viral mutation(s) within the areas targeted by this assay, and inadequate number of viral copies(<138 copies/mL). A negative result must be combined with clinical observations, patient history, and epidemiological information. The expected result is Negative.  Fact Sheet for Patients:  EntrepreneurPulse.com.au  Fact Sheet for Healthcare Providers:  IncredibleEmployment.be  This test is no t yet approved or cleared by the [place] FDA and  has been authorized for detection and/or diagnosis of SARS-CoV-2 by FDA under an Emergency Use Authorization (EUA). This EUA will remain  in effect (meaning this test can be used) for the duration of the COVID-19 declaration under Section 564(b)(1) of the Act, 21 U.S.C.section 360bbb-3(b)(1), unless the authorization is terminated  or revoked sooner.       Influenza A by PCR NEGATIVE NEGATIVE Final   Influenza B by PCR NEGATIVE NEGATIVE Final    Comment: (NOTE) The Xpert Xpress SARS-CoV-2/FLU/RSV plus assay is intended as an aid in the diagnosis of influenza from Nasopharyngeal swab specimens and should not be used as a sole basis for treatment. Nasal washings and aspirates are unacceptable for Xpert Xpress SARS-CoV-2/FLU/RSV testing.  Fact Sheet for Patients: EntrepreneurPulse.com.au  Fact Sheet for Healthcare Providers: IncredibleEmployment.be  This test is not yet approved or cleared by the [place] FDA and has been authorized for detection and/or diagnosis of SARS-CoV-2 by FDA under an Emergency Use  Authorization (EUA). This EUA will remain in effect (meaning this test can be used) for the duration of the COVID-19 declaration under Section 564(b)(1) of the Act, 21 U.S.C. section 360bbb-3(b)(1), unless the authorization is terminated or revoked.  Performed at [facility], [address] [place], [place] [address]   Urine Culture     Status: Abnormal   Collection Time: 03/08/21  4:14 PM   Specimen: Urine, Random  Result Value Ref Range Status   Specimen Description   Final    URINE, RANDOM Performed at [facility], [address], [place], [place] [address]    Special Requests   Final    NONE Performed at [facility], [address] [place],  [address]    Culture >=100,000 COLONIES/mL ESCHERICHIA COLI (A)  Final  Report Status 03/11/2021 FINAL  Final   Organism ID, Bacteria ESCHERICHIA COLI (A)  Final      Susceptibility   Escherichia coli - MIC*    AMPICILLIN >=32 RESISTANT Resistant     CEFAZOLIN <=4 SENSITIVE Sensitive     CEFEPIME <=0.12 SENSITIVE Sensitive     CEFTRIAXONE <=0.25 SENSITIVE Sensitive     CIPROFLOXACIN >=4 RESISTANT Resistant     GENTAMICIN <=1 SENSITIVE Sensitive     IMIPENEM 0.5 SENSITIVE Sensitive     NITROFURANTOIN <=16 SENSITIVE Sensitive     TRIMETH/SULFA <=20 SENSITIVE Sensitive     AMPICILLIN/SULBACTAM 16 INTERMEDIATE Intermediate     PIP/TAZO <=4 SENSITIVE Sensitive     * >=100,000 COLONIES/mL ESCHERICHIA COLI  CULTURE, BLOOD (ROUTINE X 2) w Reflex to ID Panel     Status: None   Collection Time: 03/08/21  4:26 PM   Specimen: BLOOD  Result Value Ref Range Status   Specimen Description BLOOD LEFT ANTECUBITAL  Final   Special Requests   Final    BOTTLES DRAWN AEROBIC AND ANAEROBIC Blood Culture results may not be optimal due to an excessive volume of blood received in culture bottles   Culture   Final    NO GROWTH 5 DAYS Performed at [facility], [address] [place], [place]  [address]    Report Status 03/13/2021 FINAL  Final  CULTURE, BLOOD (ROUTINE X 2) w Reflex to ID Panel     Status: None   Collection Time: 03/08/21  4:33 PM   Specimen: BLOOD  Result Value Ref Range Status   Specimen Description BLOOD BLOOD LEFT FOREARM  Final   Special Requests   Final    BOTTLES DRAWN AEROBIC ONLY Blood Culture results may not be optimal due to an inadequate volume of blood received in culture bottles   Culture   Final    NO GROWTH 5 DAYS Performed at [facility], [address] [place], [place] [address]    Report Status 03/13/2021 FINAL  Final  Gastrointestinal Panel by PCR , Stool     Status: None   Collection Time: 03/09/21  1:57 PM   Specimen: Stool  Result Value Ref Range Status   Campylobacter species NOT DETECTED NOT DETECTED Final   Plesimonas shigelloides NOT DETECTED NOT DETECTED Final   Salmonella species NOT DETECTED NOT DETECTED Final   Yersinia enterocolitica NOT DETECTED NOT DETECTED Final   Vibrio species NOT DETECTED NOT DETECTED Final   Vibrio cholerae NOT DETECTED NOT DETECTED Final   Enteroaggregative E coli (EAEC) NOT DETECTED NOT DETECTED Final   Enteropathogenic E coli (EPEC) NOT DETECTED NOT DETECTED Final   Enterotoxigenic E coli (ETEC) NOT DETECTED NOT DETECTED Final   Shiga like toxin producing E coli (STEC) NOT DETECTED NOT DETECTED Final   Shigella/Enteroinvasive E coli (EIEC) NOT DETECTED NOT DETECTED Final   Cryptosporidium NOT DETECTED NOT DETECTED Final   Cyclospora cayetanensis NOT DETECTED NOT DETECTED Final   Entamoeba histolytica NOT DETECTED NOT DETECTED Final   Giardia lamblia NOT DETECTED NOT DETECTED Final   Adenovirus F40/41 NOT DETECTED NOT DETECTED Final   Astrovirus NOT DETECTED NOT DETECTED Final   Norovirus GI/GII NOT DETECTED NOT DETECTED Final   Rotavirus A NOT DETECTED NOT DETECTED Final   Sapovirus (I, II, IV, and V) NOT DETECTED NOT DETECTED Final    Comment: Performed at [facility], [address] [place], [place] [address]  C Difficile Quick Screen w PCR reflex  Status: None   Collection Time: 03/09/21  1:57 PM   Specimen: Stool  Result Value Ref Range Status   C Diff antigen NEGATIVE NEGATIVE Final   C Diff toxin NEGATIVE NEGATIVE Final   C Diff interpretation No C. difficile detected.  Final    Comment: Performed at [facility], [address] [place], [place] [address]  Resp Panel by RT-PCR (Flu A&B, Covid) Nasopharyngeal Swab     Status: None   Collection Time: 03/16/21  9:00 AM   Specimen: Nasopharyngeal Swab; Nasopharyngeal(NP) swabs in vial transport medium  Result Value Ref Range Status   SARS Coronavirus 2 by RT PCR NEGATIVE NEGATIVE Final    Comment: (NOTE) SARS-CoV-2 target nucleic acids are NOT DETECTED.  The SARS-CoV-2 RNA is generally detectable in upper respiratory specimens during the acute phase of infection. The lowest concentration of SARS-CoV-2 viral copies this assay can detect is 138 copies/mL. A negative result does not preclude SARS-Cov-2 infection and should not be used as the sole basis for treatment or other patient management decisions. A negative result may occur with  improper specimen collection/handling, submission of specimen other than nasopharyngeal swab, presence of viral mutation(s) within the areas targeted by this assay, and inadequate number of viral copies(<138 copies/mL). A negative result must be combined with clinical observations, patient history, and epidemiological information. The expected result is Negative.  Fact Sheet for Patients:  EntrepreneurPulse.com.au  Fact Sheet for Healthcare Providers:  IncredibleEmployment.be  This test is no t yet approved or cleared by the [place] FDA and  has been authorized for detection and/or diagnosis of SARS-CoV-2 by FDA under an Emergency Use Authorization (EUA). This EUA will remain  in effect (meaning this test can  be used) for the duration of the COVID-19 declaration under Section 564(b)(1) of the Act, 21 U.S.C.section 360bbb-3(b)(1), unless the authorization is terminated  or revoked sooner.       Influenza A by PCR NEGATIVE NEGATIVE Final   Influenza B by PCR NEGATIVE NEGATIVE Final    Comment: (NOTE) The Xpert Xpress SARS-CoV-2/FLU/RSV plus assay is intended as an aid in the diagnosis of influenza from Nasopharyngeal swab specimens and should not be used as a sole basis for treatment. Nasal washings and aspirates are unacceptable for Xpert Xpress SARS-CoV-2/FLU/RSV testing.  Fact Sheet for Patients: EntrepreneurPulse.com.au  Fact Sheet for Healthcare Providers: IncredibleEmployment.be  This test is not yet approved or cleared by the [place] FDA and has been authorized for detection and/or diagnosis of SARS-CoV-2 by FDA under an Emergency Use Authorization (EUA). This EUA will remain in effect (meaning this test can be used) for the duration of the COVID-19 declaration under Section 564(b)(1) of the Act, 21 U.S.C. section 360bbb-3(b)(1), unless the authorization is terminated or revoked.  Performed at [facility], [address], [place], [place] [address]      Discharge Instructions:   Discharge Instructions     Discharge instructions   Complete by: As directed    DYS 1 diet   Increase activity slowly   Complete by: As directed       Allergies as of 03/16/2021       Reactions   Aspirin Itching, Other (See Comments)   Dizzy/passing out   Augmentin [amoxicillin-pot Clavulanate]    Codeine Other (See Comments)   dizziness   Doxycycline Hyclate Other (See Comments)   Sulfamethoxazole-trimethoprim Diarrhea   Tape Itching   Tramadol Other (See Comments)   Makes her feel dizzy and feels like  passing out Pt states she can now take this medication.  (02/27/18)   Vitamin D Analogs    Azithromycin Rash, Hives         Medication List     STOP taking these medications    Accu-Chek Aviva Plus test strip Generic drug: glucose blood   FLAX SEED OIL PO   gabapentin 100 MG capsule Commonly known as: NEURONTIN   lisinopril 5 MG tablet Commonly known as: ZESTRIL   traMADol 50 MG tablet Commonly known as: ULTRAM       TAKE these medications    acetaminophen 325 MG tablet Commonly known as: TYLENOL Take 650 mg by mouth every 6 (six) hours as needed for pain.   acidophilus Caps capsule Take 2 capsules by mouth daily. Start taking on: March 17, 2021   diclofenac Sodium 1 % Gel Commonly known as: VOLTAREN Apply 2 g topically 4 (four) times daily.   hydroxychloroquine 200 MG tablet Commonly known as: PLAQUENIL Take 1 tablet by mouth See admin instructions. Takes alternates 1 tablet at bedtime then 2 tablets at night   lidocaine 5 % Commonly known as: LIDODERM Place 1 patch onto the skin daily. Remove & Discard patch within 12 hours or as directed by MD   mirtazapine 15 MG disintegrating tablet Commonly known as: REMERON SOL-TAB Take 0.5 tablets (7.5 mg total) by mouth at bedtime.   multivitamin with minerals Tabs tablet Take 1 tablet by mouth daily. Start taking on: March 17, 2021   nystatin 100000 UNIT/ML suspension Commonly known as: MYCOSTATIN Take 5 mLs (500,000 Units total) by mouth 4 (four) times daily.   pantoprazole 40 MG tablet Commonly known as: PROTONIX Take 1 tablet (40 mg total) by mouth 2 (two) times daily.   polyethylene glycol 17 g packet Commonly known as: MIRALAX / GLYCOLAX Take 17 g by mouth daily as needed for mild constipation.   predniSONE 20 MG tablet Commonly known as: DELTASONE Take 1 tablet (20 mg total) by mouth daily with breakfast. Start taking on: March 17, 2021 What changed:  medication strength how much to take how to take this when to take this additional instructions        Follow-up Information     [name], MD Follow up in 1  week(s).   Specialties: Internal Medicine, Cardiology Contact information: [place] [address] [phone]                  Time coordinating discharge: 35 min  Signed:  Geradine Girt DO  [facility] 03/16/2021, 10:43 AM

## 2021-03-16 NOTE — TOC Transition Note (Signed)
Transition of Care [facility]) - CM/SW Discharge Note   Patient Details  Name: Stacy [name] MRN: [id number] Date of Birth: 1927-09-21  Transition of Care [facility]) CM/SW Contact:  Shelbie Hutching, RN Phone Number: 03/16/2021, 12:32 PM   Clinical Narrative:    Patient medically cleared for discharge to [facility] for short term rehab.  Patient will be going to room 501.  [facility] has been arranged and patient is 2nd on the list for pick up.  Patient's daughter [name] has been updated on discharge today.     Final next level of care: Skilled Nursing Facility Barriers to Discharge: Barriers Resolved   Patient Goals and CMS Choice Patient states their goals for this hospitalization and ongoing recovery are:: Patient wants to fell better, get well and go home. CMS Medicare.gov Compare Post Acute Care list provided to:: Patient Choice offered to / list presented to : Patient, Adult Children  Discharge Placement              Patient chooses bed at: [facility] Patient to be transferred to facility by: [facility] Name of family member notified: Ginger (daughter) Patient and family notified of of transfer: 03/16/21  Discharge Plan and Services   Discharge Planning Services: CM Consult            DME Arranged: N/A DME Agency: NA       HH Arranged: NA          Social Determinants of Health ([place]) Interventions     Readmission Risk Interventions No flowsheet data found.

## 2021-03-17 DIAGNOSIS — M1612 Unilateral primary osteoarthritis, left hip: Secondary | ICD-10-CM | POA: Diagnosis not present

## 2021-03-17 DIAGNOSIS — M353 Polymyalgia rheumatica: Secondary | ICD-10-CM | POA: Diagnosis not present

## 2021-03-17 DIAGNOSIS — D62 Acute posthemorrhagic anemia: Secondary | ICD-10-CM | POA: Diagnosis not present

## 2021-03-17 DIAGNOSIS — E118 Type 2 diabetes mellitus with unspecified complications: Secondary | ICD-10-CM | POA: Diagnosis not present

## 2021-03-17 DIAGNOSIS — D6489 Other specified anemias: Secondary | ICD-10-CM | POA: Diagnosis present

## 2021-04-05 ENCOUNTER — Other Ambulatory Visit: Payer: Self-pay | Admitting: Internal Medicine

## 2021-04-05 DIAGNOSIS — M353 Polymyalgia rheumatica: Secondary | ICD-10-CM

## 2021-04-06 ENCOUNTER — Telehealth: Payer: Self-pay | Admitting: Internal Medicine

## 2021-04-06 NOTE — Chronic Care Management (AMB) (Signed)
  Chronic Care Management   Note  04/06/2021 Name: [name] MRN: [id number] DOB: 07/07/1927  [name] is a 87 y.o. year old female who is a primary care patient of [name], MD. I reached out to Ames Dura by phone today in response to a referral sent by Ms. Roberto Scales Cormany's PCP, [name], MD.   Ms. Reber was given information about Chronic Care Management services today including:  CCM service includes personalized support from designated clinical staff supervised by her physician, including individualized plan of care and coordination with other care providers 24/7 contact phone numbers for assistance for urgent and routine care needs. Service will only be billed when office clinical staff spend 20 minutes or more in a month to coordinate care. Only one practitioner may furnish and bill the service in a calendar month. The patient may stop CCM services at any time (effective at the end of the month) by phone call to the office staff.   Patient agreed to services and verbal consent obtained.   Follow up plan:   Tatjana Secretary/administrator

## 2021-04-07 ENCOUNTER — Ambulatory Visit: Payer: Medicare Other | Admitting: Pharmacist

## 2021-04-07 DIAGNOSIS — J449 Chronic obstructive pulmonary disease, unspecified: Secondary | ICD-10-CM

## 2021-04-07 DIAGNOSIS — E119 Type 2 diabetes mellitus without complications: Secondary | ICD-10-CM

## 2021-04-07 DIAGNOSIS — M353 Polymyalgia rheumatica: Secondary | ICD-10-CM

## 2021-04-07 DIAGNOSIS — I10 Essential (primary) hypertension: Secondary | ICD-10-CM

## 2021-04-07 NOTE — Patient Instructions (Signed)
Visit Information   Goals Addressed             This Visit's Progress    Manage My Medicine       Timeframe:  Short-Term Goal Priority:  High Start Date:  04/07/21                           Expected End Date: 05/26/21                      Follow Up Date 06/25/21    - keep a list of all the medicines I take; vitamins and herbals too - use a pillbox to sort medicine - use an alarm clock or phone to remind me to take my medicine    Why is this important?   These steps will help you keep on track with your medicines.   Notes: Pharmacist clarifying med list moving forward - will update once complete.        Patient Care Plan: General Pharmacy (Adult)     Problem Identified: HTN, Hypothyroidism, Type II DM, COPD, Polymyalgia rheumatica   Priority: High  Onset Date: 04/07/2021     Long-Range Goal: Patient-Specific Goal   Start Date: 04/07/2021  Expected End Date: 10/08/2021  This Visit's Progress: On track  Priority: High  Note:   Current Barriers:  Unable to self administer medications as prescribed  Pharmacist Clinical Goal(s):  Patient will achieve ability to self administer medications as prescribed through use of pill box as evidenced by patient report adhere to prescribed medication regimen as evidenced by fill dates contact provider office for questions/concerns as evidenced notation of same in electronic health record through collaboration with PharmD and provider.   Interventions: 1:1 collaboration with [name], MD regarding development and update of comprehensive plan of care as evidenced by provider attestation and co-signature Inter-disciplinary care team collaboration (see longitudinal plan of care) Comprehensive medication review performed; medication list updated in electronic medical record  Hypertension (BP goal <140/90) -Controlled -Current treatment: None -Medications previously tried: amlodipine, lisinopril  -Current home readings: has not  checked recently -Current dietary habits: did not eat much in the hospital due to bland food, did lose some weight -Current exercise habits: minimal -Denies hypotensive/hypertensive symptoms -Educated on BP goals and benefits of medications for prevention of heart attack, stroke and kidney damage; Importance of home blood pressure monitoring; Symptoms of hypotension and importance of maintaining adequate hydration; -Counseled to monitor BP at home a few times per week, document, and provide log at future appointments -Counseled on diet and exercise extensively Recommended continue current management  Diabetes (A1c goal <7%) -Controlled -Current medications: None -Medications previously tried: none  -Current home glucose readings fasting glucose: not checking post prandial glucose: not checking -Denies hypoglycemic/hyperglycemic symptoms  -Educated on A1c and blood sugar goals; Benefits of routine self-monitoring of blood sugar; Risk of hyperglycemia with prednisone, especially with increased doses -Counseled to check feet daily and get yearly eye exams -Recommended continue current management Looks as if she was getting correction insulin in the hospital, have asked them to monitor sugars really well and let me know if it spikes.  Will consult with Dr. Jefm Bryant on appropriate prednisone dose moving forward.  COPD (Goal: control symptoms and prevent exacerbations) -Controlled -Current treatment  None -Medications previously tried: none   -Pulmonary function testing: Pulmonary Functions Testing Results:  No results found for: FEV1, FVC, FEV1FVC, TLC, DLCO  -Exacerbations  requiring treatment in last 6 months: none -Patient denies consistent use of maintenance inhaler - does not have one -Frequency of rescue inhaler use: none  -Patient was sent home with Rx for nebulizer, however they do not have a nebulizer machine at home.  Will consult with PCP to see how we can get them a  nebulizer machine.  Hypothyroidism (Goal: Maintain TSH) -Controlled -Current treatment  None -Medications previously tried: none noted -TSH WNL  -Continue regular monitoring  Polymyalgia Rheumatica (Goal: Minimize symptoms) -Controlled -Current treatment  Plaquenil 200mg  alternate between 1 tablet and two tablets at bedtime Prednisone 20mg  daily -Medications previously tried: tramadol, gabapentin - both stopped after hospital stay  -Recommended to continue current medication Collaborated with Rheumatologist on proper dose of prednisone moving forward.  Have asked patient to monitor sugars moving forward.  Patient Goals/Self-Care Activities Patient will:  - take medications as prescribed focus on medication adherence by pill box check glucose daily, document, and provide at future appointments  Follow Up Plan: The care management team will reach out to the patient again over the next 30 days.      Ms. Slinger was given information about Chronic Care Management services today including:  CCM service includes personalized support from designated clinical staff supervised by her physician, including individualized plan of care and coordination with other care providers 24/7 contact phone numbers for assistance for urgent and routine care needs. Standard insurance, coinsurance, copays and deductibles apply for chronic care management only during months in which we provide at least 20 minutes of these services. Most insurances cover these services at 100%, however patients may be responsible for any copay, coinsurance and/or deductible if applicable. This service may help you avoid the need for more expensive face-to-face services. Only one practitioner may furnish and bill the service in a calendar month. The patient may stop CCM services at any time (effective at the end of the month) by phone call to the office staff.  Patient agreed to services and verbal consent obtained.   Patient  verbalizes understanding of instructions provided today and agrees to view in [place].  Telephone follow up appointment with pharmacy team member scheduled for: 29 days  Edythe Clarity, [place]

## 2021-04-07 NOTE — Progress Notes (Signed)
Chronic Care Management Pharmacy Note  04/07/2021 Name:  Stacy [name] MRN:  [id number] DOB:  1927-02-20  Summary: Initial visit with PharmD.  Majority of this visit spent on med rec from recent hospital and rehab stay.  Reviewed new meds from rehab and discussed plan moving forward.  Recommendations/Changes made from today's visit: Monitor sugars close with increased prednisone dose. Consult with rheumatology on dose moving forward.  Plan: FU with patient in 30 days   Subjective: Stacy [name] is an 87 y.o. year old female who is a primary patient of Stacy [name], Stacy Gaul, MD.  The CCM team was consulted for assistance with disease management and care coordination needs.    Engaged with patient by telephone for initial visit in response to provider referral for pharmacy case management and/or care coordination services.   Consent to Services:  The patient was given the following information about Chronic Care Management services today, agreed to services, and gave verbal consent: 1. CCM service includes personalized support from designated clinical staff supervised by the primary care provider, including individualized plan of care and coordination with other care providers 2. 24/7 contact phone numbers for assistance for urgent and routine care needs. 3. Service will only be billed when office clinical staff spend 20 minutes or more in a month to coordinate care. 4. Only one practitioner may furnish and bill the service in a calendar month. 5.The patient may stop CCM services at any time (effective at the end of the month) by phone call to the office staff. 6. The patient will be responsible for cost sharing (co-pay) of up to 20% of the service fee (after annual deductible is met). Patient agreed to services and consent obtained.  Patient Care Team: [name], MD as PCP - General (Internal Medicine) Christene Lye, MD as Consulting Physician (General Surgery) Edythe Clarity, [facility] as  Pharmacist (Pharmacist) Edythe Clarity, [facility] as Pharmacist (Pharmacist)  Recent office visits: 11/16/53 [place]) - patient no longer taking Singulair or Flonase, not using any inhalers.  Denies any recent exacerbations.  No changes to medications, just updated med list to current.  Recent consult visits: 03/17/21 [place], [place]) - no changes to medication, anemia due to minor vaginal problem with bleeding. Hgb was down to 5.  Hospital visits: 03/07/21 - Vaginal bleeding,   Objective:  Lab Results  Component Value Date   CREATININE 1.00 03/16/2021   BUN 10 03/14/2021   GFRNONAA 53 (L) 03/16/2021   GFRAA >60 03/06/2013   NA 138 03/14/2021   K 4.2 03/14/2021   CALCIUM 7.6 (L) 03/14/2021   CO2 25 03/14/2021   GLUCOSE 84 03/14/2021    Lab Results  Component Value Date/Time   HGBA1C 5.6 03/07/2021 06:24 PM   HGBA1C 5.7 (A) 10/05/2018 03:55 PM   HGBA1C 5.6 07/05/2018 02:38 PM    Last diabetic Eye exam: No results found for: HMDIABEYEEXA  Last diabetic Foot exam: No results found for: HMDIABFOOTEX   No results found for: CHOL, HDL, LDLCALC, LDLDIRECT, TRIG, CHOLHDL  Hepatic Function Latest Ref Rng & Units 03/10/2021 03/07/2021 05/09/2012  Total Protein 6.5 - 8.1 g/dL 5.1(L) 6.3(L) 8.2  Albumin 3.5 - 5.0 g/dL 2.2(L) 3.2(L) 3.9  AST 15 - 41 U/L 16 14(L) 28  ALT 0 - 44 U/L _0 Alk Phosphatase 38 - 126 U/L 48 55 67  Total Bilirubin 0.3 - 1.2 mg/dL 0.9 0.4 0.4    No results found for: TSH, FREET4  CBC Latest Ref Rng & Units 03/14/2021 03/12/2021 03/11/2021  WBC 4.0 - 10.5 K/uL 8.4 8.6 9.7  Hemoglobin 12.0 - 15.0 g/dL 9.7(L) 9.5(L) 10.1(L)  Hematocrit 36.0 - 46.0 % 29.7(L) 29.2(L) 30.0(L)  Platelets 150 - 400 K/uL 167 141(L) 127(L)    No results found for: VD25OH  Clinical ASCVD: No  The ASCVD Risk score Mikey Bussing DC Jr., et al., 2013) failed to calculate for the following reasons:   The 2013 ASCVD risk score is only valid for ages 55 to 64    Depression screen  PHQ 2/9 10/03/2019 02/04/2019 10/05/2018  Decreased Interest 0 0 0  Down, Depressed, Hopeless 0 0 0  PHQ - 2 Score 0 0 0     Social History   Tobacco Use  Smoking Status Never  Smokeless Tobacco Never   BP Readings from Last 3 Encounters:  03/16/21 122/84  03/07/21 107/89  11/16/20 119/80   Pulse Readings from Last 3 Encounters:  03/16/21 95  03/07/21 81  11/16/20 96   Wt Readings from Last 3 Encounters:  03/16/21 172 lb 2.9 oz (78.1 kg)  03/07/21 158 lb 11.7 oz (72 kg)  11/16/20 160 lb (72.6 kg)   BMI Readings from Last 3 Encounters:  03/16/21 32.53 kg/m  03/07/21 29.99 kg/m  11/16/20 27.46 kg/m    Assessment/Interventions: Review of patient past medical history, allergies, medications, health status, including review of consultants reports, laboratory and other test data, was performed as part of comprehensive evaluation and provision of chronic care management services.   SDOH:  (Social Determinants of Health) assessments and interventions performed: Yes  Financial Resource Strain: Low Risk    Difficulty of Paying Living Expenses: Not very hard    SDOH Screenings   Alcohol Screen: Not on file  Depression (PHQ2-9): Not on file  Financial Resource Strain: Low Risk    Difficulty of Paying Living Expenses: Not very hard  Food Insecurity: Not on file  Housing: Not on file  Physical Activity: Not on file  Social Connections: Not on file  Stress: Not on file  Tobacco Use: Low Risk    Smoking Tobacco Use: Never   Smokeless Tobacco Use: Never  Transportation Needs: Not on file    [place]  Allergies  Allergen Reactions   Aspirin Itching and Other (See Comments)    Dizzy/passing out   Augmentin [Amoxicillin-Pot Clavulanate]    Codeine Other (See Comments)    dizziness   Doxycycline Hyclate Other (See Comments)   Sulfamethoxazole-Trimethoprim Diarrhea   Tape Itching   Tramadol Other (See Comments)    Makes her feel dizzy and feels like passing  out Pt states she can now take this medication.  (02/27/18)   Vitamin D Analogs    Azithromycin Rash and Hives    Medications Reviewed Today     Reviewed by Edythe Clarity, [facility] (Pharmacist) on 04/07/21 at [place] List Status: <None>   Medication Order Taking? Sig Documenting Provider Last Dose Status Informant  acetaminophen (TYLENOL) 325 MG tablet [id number]  Take 650 mg by mouth every 6 (six) hours as needed for pain. [provider]  Active Family Member  acidophilus (RISAQUAD) CAPS capsule [id number]  Take 2 capsules by mouth daily. Geradine Girt, DO  Active   diclofenac Sodium (VOLTAREN) 1 % GEL [id number]  Apply 2 g topically 4 (four) times daily. Geradine Girt, DO  Active   gabapentin (NEURONTIN) 100 MG capsule [id number] No TAKE 1 CAPSULE TWICE DAILY  Patient  not taking: Reported on 04/07/2021   [name], MD Not Taking Active   hydroxychloroquine (PLAQUENIL) 200 MG tablet [id number]  Take 1 tablet by mouth See admin instructions. Takes alternates 1 tablet at bedtime then 2 tablets at night [provider]  Active Family Member           Med Note Juanetta Snow Mar 08, 2021  8:27 AM) 2 tonight   lidocaine (LIDODERM) 5 % [id number]  Place 1 patch onto the skin daily. Remove & Discard patch within 12 hours or as directed by MD Geradine Girt, DO  Active   mirtazapine (REMERON SOL-TAB) 15 MG disintegrating tablet [id number]  Take 0.5 tablets (7.5 mg total) by mouth at bedtime. Geradine Girt, DO  Active   Multiple Vitamin (MULTIVITAMIN WITH MINERALS) TABS tablet [id number]  Take 1 tablet by mouth daily. Geradine Girt, DO  Active   nystatin (MYCOSTATIN) 100000 UNIT/ML suspension [id number]  Take 5 mLs (500,000 Units total) by mouth 4 (four) times daily. Geradine Girt, DO  Active   pantoprazole (PROTONIX) 40 MG tablet [id number]  Take 1 tablet (40 mg total) by mouth 2 (two) times daily. Geradine Girt, DO  Active   polyethylene glycol (MIRALAX /  GLYCOLAX) 17 g packet [id number]  Take 17 g by mouth daily as needed for mild constipation. Geradine Girt, DO  Active   predniSONE (DELTASONE) 20 MG tablet [id number]  Take 1 tablet (20 mg total) by mouth daily with breakfast. Geradine Girt, DO  Active             Patient Active Problem List   Diagnosis Date Noted   Malnutrition of moderate degree 03/13/2021   Hypomagnesemia 03/08/2021   AKI (acute kidney injury) ([place]) 03/08/2021   Chronic respiratory failure with hypoxia ([place]) 03/07/2021   Acute blood loss anemia 03/07/2021   Leukocytosis 03/07/2021   Acute colitis on CT 03/07/2021   Encounter for general adult medical examination with abnormal findings 07/05/2018   Uncontrolled type 2 diabetes mellitus with hyperglycemia ([place]) 07/05/2018   Neoplasm of uncertain behavior of skin of hand 07/05/2018   Simple chronic bronchitis ([place]) 12/31/2017   Type 2 diabetes mellitus with hyperglycemia ([place]) 12/31/2017   Congestive heart failure ([place]) 10/20/2017   Primary generalized (osteo)arthritis 09/21/2017   Vitamin B12 deficiency anemia, unspecified 09/21/2017   Chronic kidney disease, unspecified 09/21/2017   Vitamin D deficiency, unspecified 09/21/2017   COPD (chronic obstructive pulmonary disease) ([place]) 09/21/2017   Unspecified hearing loss, bilateral 09/21/2017   Age-related osteoporosis without current pathological fracture 09/21/2017   Other amnesia 09/21/2017   Cough 09/21/2017   Allergic rhinitis, unspecified 09/21/2017   Type 2 diabetes mellitus without complication, without long-term current use of insulin ([place]) 09/21/2017   Other seborrheic dermatitis 09/21/2017   Unspecified cervical disc disorder, mid-cervical region, unspecified level 09/21/2017   Neuromuscular dysfunction of bladder, unspecified 09/21/2017   Allergic rhinitis due to pollen 09/21/2017   Other primary ovarian failure 09/21/2017   Shortness of breath 09/21/2017   Acute bronchitis, unspecified 09/21/2017    Benign and innocent cardiac murmurs 09/21/2017   Dysuria 09/21/2017   Retention of urine, unspecified 09/21/2017   Unspecified urinary incontinence 09/21/2017   Mixed incontinence 09/21/2017   Insomnia, unspecified 09/21/2017   Presyncope secondary to symptomatic anemia 09/21/2017   Polymyalgia rheumatica ([place]) 09/21/2017   Low back pain 09/21/2017   Hypoxemia 09/21/2017   Spondylosis without myelopathy or radiculopathy, lumbosacral region 09/21/2017  Pain in unspecified knee 09/21/2017   Mixed hyperlipidemia 09/21/2017   Cardiomegaly 09/21/2017   Abnormality of gait and mobility 09/21/2017   Personal history of malignant neoplasm of breast 04/15/2013   History of breast cancer 12/24/2012   Incisional hernia, without obstruction or gangrene 12/24/2012   Essential hypertension    Vitiligo    Arthritis    Hypothyroidism    Bowel trouble     Immunization History  Administered Date(s) Administered   Influenza Inj Mdck Quad Pf 09/21/2017   PFIZER Comirnaty(Gray Top)Covid-19 Tri-Sucrose Vaccine 03/16/2021   PFIZER(Purple Top)SARS-COV-2 Vaccination 11/01/2019, 11/22/2019    Conditions to be addressed/monitored:  HTN, Hypothyroidism, Type II DM, COPD, Polymyalgia rheumatica  Care Plan : General Pharmacy (Adult)  Updates made by Edythe Clarity, RPH since 04/07/2021 12:00 AM     Problem: HTN, Hypothyroidism, Type II DM, COPD, Polymyalgia rheumatica   Priority: High  Onset Date: 04/07/2021     Long-Range Goal: Patient-Specific Goal   Start Date: 04/07/2021  Expected End Date: 10/08/2021  This Visit's Progress: On track  Priority: High  Note:   Current Barriers:  Unable to self administer medications as prescribed  Pharmacist Clinical Goal(s):  Patient will achieve ability to self administer medications as prescribed through use of pill box as evidenced by patient report adhere to prescribed medication regimen as evidenced by fill dates contact provider office for  questions/concerns as evidenced notation of same in electronic health record through collaboration with PharmD and provider.   Interventions: 1:1 collaboration with [name], MD regarding development and update of comprehensive plan of care as evidenced by provider attestation and co-signature Inter-disciplinary care team collaboration (see longitudinal plan of care) Comprehensive medication review performed; medication list updated in electronic medical record  Hypertension (BP goal <140/90) -Controlled -Current treatment: None -Medications previously tried: amlodipine, lisinopril  -Current home readings: has not checked recently -Current dietary habits: did not eat much in the hospital due to bland food, did lose some weight -Current exercise habits: minimal -Denies hypotensive/hypertensive symptoms -Educated on BP goals and benefits of medications for prevention of heart attack, stroke and kidney damage; Importance of home blood pressure monitoring; Symptoms of hypotension and importance of maintaining adequate hydration; -Counseled to monitor BP at home a few times per week, document, and provide log at future appointments -Counseled on diet and exercise extensively Recommended continue current management  Diabetes (A1c goal <7%) -Controlled -Current medications: None -Medications previously tried: none  -Current home glucose readings fasting glucose: not checking post prandial glucose: not checking -Denies hypoglycemic/hyperglycemic symptoms  -Educated on A1c and blood sugar goals; Benefits of routine self-monitoring of blood sugar; Risk of hyperglycemia with prednisone, especially with increased doses -Counseled to check feet daily and get yearly eye exams -Recommended continue current management Looks as if she was getting correction insulin in the hospital, have asked them to monitor sugars really well and let me know if it spikes.  Will consult with Dr. Jefm Bryant on  appropriate prednisone dose moving forward.  COPD (Goal: control symptoms and prevent exacerbations) -Controlled -Current treatment  None -Medications previously tried: none   -Pulmonary function testing: Pulmonary Functions Testing Results:  No results found for: FEV1, FVC, FEV1FVC, TLC, DLCO  -Exacerbations requiring treatment in last 6 months: none -Patient denies consistent use of maintenance inhaler - does not have one -Frequency of rescue inhaler use: none  -Patient was sent home with Rx for nebulizer, however they do not have a nebulizer machine at home.  Will consult  with PCP to see how we can get them a nebulizer machine.  Hypothyroidism (Goal: Maintain TSH) -Controlled -Current treatment  None -Medications previously tried: none noted -TSH WNL  -Continue regular monitoring  Polymyalgia Rheumatica (Goal: Minimize symptoms) -Controlled -Current treatment  Plaquenil 224m alternate between 1 tablet and two tablets at bedtime Prednisone 224mdaily -Medications previously tried: tramadol, gabapentin - both stopped after hospital stay  -Recommended to continue current medication Collaborated with Rheumatologist on proper dose of prednisone moving forward.  Have asked patient to monitor sugars moving forward.  Patient Goals/Self-Care Activities Patient will:  - take medications as prescribed focus on medication adherence by pill box check glucose daily, document, and provide at future appointments  Follow Up Plan: The care management team will reach out to the patient again over the next 30 days.       Medication Assistance: None required.  Patient affirms current coverage meets needs.  Compliance/Adherence/Medication fill history: Care Gaps: Urine microalbumin Eye exam Foot exam    [facility]  [id number]: [phone]: [phone] [place]: [phone]: [phone] pill box? Yes Pt endorses 100% compliance  We discussed: Benefits of medication synchronization, packaging and delivery as well as enhanced pharmacist oversight with [employer]. Patient decided to: Continue current medication management strategy  Care Plan and Follow Up Patient Decision:  Patient agrees to Care Plan and Follow-up.  Plan: The care management team will reach out to the patient again over the next 30 days.  ChBeverly MilchPharmD Clinical Pharmacist NoPort[phone]

## 2021-04-08 ENCOUNTER — Other Ambulatory Visit: Payer: Self-pay

## 2021-04-08 MED ORDER — PANTOPRAZOLE SODIUM 40 MG PO TBEC: 40.0000 mg | DELAYED_RELEASE_TABLET | Freq: Two times a day (BID) | ORAL | 1 refills | Status: DC

## 2021-04-09 DIAGNOSIS — N939 Abnormal uterine and vaginal bleeding, unspecified: Secondary | ICD-10-CM | POA: Diagnosis not present

## 2021-04-09 DIAGNOSIS — I471 Supraventricular tachycardia: Secondary | ICD-10-CM | POA: Diagnosis not present

## 2021-04-09 DIAGNOSIS — J45909 Unspecified asthma, uncomplicated: Secondary | ICD-10-CM | POA: Diagnosis not present

## 2021-04-09 DIAGNOSIS — E039 Hypothyroidism, unspecified: Secondary | ICD-10-CM | POA: Diagnosis not present

## 2021-04-09 DIAGNOSIS — E1122 Type 2 diabetes mellitus with diabetic chronic kidney disease: Secondary | ICD-10-CM | POA: Diagnosis not present

## 2021-04-09 DIAGNOSIS — Z7952 Long term (current) use of systemic steroids: Secondary | ICD-10-CM | POA: Diagnosis not present

## 2021-04-09 DIAGNOSIS — Z853 Personal history of malignant neoplasm of breast: Secondary | ICD-10-CM | POA: Diagnosis not present

## 2021-04-09 DIAGNOSIS — S81802D Unspecified open wound, left lower leg, subsequent encounter: Secondary | ICD-10-CM | POA: Diagnosis not present

## 2021-04-09 DIAGNOSIS — M25552 Pain in left hip: Secondary | ICD-10-CM | POA: Diagnosis not present

## 2021-04-09 DIAGNOSIS — I509 Heart failure, unspecified: Secondary | ICD-10-CM | POA: Diagnosis not present

## 2021-04-09 DIAGNOSIS — M353 Polymyalgia rheumatica: Secondary | ICD-10-CM | POA: Diagnosis not present

## 2021-04-09 DIAGNOSIS — E440 Moderate protein-calorie malnutrition: Secondary | ICD-10-CM | POA: Diagnosis not present

## 2021-04-09 DIAGNOSIS — E1140 Type 2 diabetes mellitus with diabetic neuropathy, unspecified: Secondary | ICD-10-CM | POA: Diagnosis not present

## 2021-04-09 DIAGNOSIS — N189 Chronic kidney disease, unspecified: Secondary | ICD-10-CM | POA: Diagnosis not present

## 2021-04-09 DIAGNOSIS — J449 Chronic obstructive pulmonary disease, unspecified: Secondary | ICD-10-CM | POA: Diagnosis not present

## 2021-04-09 DIAGNOSIS — K219 Gastro-esophageal reflux disease without esophagitis: Secondary | ICD-10-CM | POA: Diagnosis not present

## 2021-04-09 DIAGNOSIS — D62 Acute posthemorrhagic anemia: Secondary | ICD-10-CM | POA: Diagnosis not present

## 2021-04-09 DIAGNOSIS — M1612 Unilateral primary osteoarthritis, left hip: Secondary | ICD-10-CM | POA: Diagnosis not present

## 2021-04-09 DIAGNOSIS — R1311 Dysphagia, oral phase: Secondary | ICD-10-CM | POA: Diagnosis not present

## 2021-04-09 DIAGNOSIS — I130 Hypertensive heart and chronic kidney disease with heart failure and stage 1 through stage 4 chronic kidney disease, or unspecified chronic kidney disease: Secondary | ICD-10-CM | POA: Diagnosis not present

## 2021-04-11 DIAGNOSIS — M25552 Pain in left hip: Secondary | ICD-10-CM | POA: Diagnosis not present

## 2021-04-11 DIAGNOSIS — M1612 Unilateral primary osteoarthritis, left hip: Secondary | ICD-10-CM | POA: Diagnosis not present

## 2021-04-11 DIAGNOSIS — E440 Moderate protein-calorie malnutrition: Secondary | ICD-10-CM | POA: Diagnosis not present

## 2021-04-11 DIAGNOSIS — N939 Abnormal uterine and vaginal bleeding, unspecified: Secondary | ICD-10-CM | POA: Diagnosis not present

## 2021-04-11 DIAGNOSIS — D62 Acute posthemorrhagic anemia: Secondary | ICD-10-CM | POA: Diagnosis not present

## 2021-04-11 DIAGNOSIS — M353 Polymyalgia rheumatica: Secondary | ICD-10-CM | POA: Diagnosis not present

## 2021-04-13 DIAGNOSIS — D62 Acute posthemorrhagic anemia: Secondary | ICD-10-CM | POA: Diagnosis not present

## 2021-04-13 DIAGNOSIS — E440 Moderate protein-calorie malnutrition: Secondary | ICD-10-CM | POA: Diagnosis not present

## 2021-04-13 DIAGNOSIS — N939 Abnormal uterine and vaginal bleeding, unspecified: Secondary | ICD-10-CM | POA: Diagnosis not present

## 2021-04-13 DIAGNOSIS — M25552 Pain in left hip: Secondary | ICD-10-CM | POA: Diagnosis not present

## 2021-04-13 DIAGNOSIS — M1612 Unilateral primary osteoarthritis, left hip: Secondary | ICD-10-CM | POA: Diagnosis not present

## 2021-04-13 DIAGNOSIS — M353 Polymyalgia rheumatica: Secondary | ICD-10-CM | POA: Diagnosis not present

## 2021-04-15 ENCOUNTER — Encounter: Payer: Self-pay | Admitting: Internal Medicine

## 2021-04-15 ENCOUNTER — Other Ambulatory Visit: Payer: Self-pay

## 2021-04-15 ENCOUNTER — Ambulatory Visit (INDEPENDENT_AMBULATORY_CARE_PROVIDER_SITE_OTHER): Payer: Medicare Other | Admitting: Internal Medicine

## 2021-04-15 DIAGNOSIS — E440 Moderate protein-calorie malnutrition: Secondary | ICD-10-CM | POA: Diagnosis not present

## 2021-04-15 DIAGNOSIS — D62 Acute posthemorrhagic anemia: Secondary | ICD-10-CM | POA: Diagnosis not present

## 2021-04-15 DIAGNOSIS — M25552 Pain in left hip: Secondary | ICD-10-CM | POA: Diagnosis not present

## 2021-04-15 DIAGNOSIS — J449 Chronic obstructive pulmonary disease, unspecified: Secondary | ICD-10-CM

## 2021-04-15 DIAGNOSIS — J4540 Moderate persistent asthma, uncomplicated: Secondary | ICD-10-CM | POA: Diagnosis not present

## 2021-04-15 DIAGNOSIS — M353 Polymyalgia rheumatica: Secondary | ICD-10-CM

## 2021-04-15 DIAGNOSIS — K219 Gastro-esophageal reflux disease without esophagitis: Secondary | ICD-10-CM | POA: Diagnosis not present

## 2021-04-15 DIAGNOSIS — M1612 Unilateral primary osteoarthritis, left hip: Secondary | ICD-10-CM | POA: Diagnosis not present

## 2021-04-15 DIAGNOSIS — N939 Abnormal uterine and vaginal bleeding, unspecified: Secondary | ICD-10-CM | POA: Diagnosis not present

## 2021-04-15 MED ORDER — TRAMADOL HCL 50 MG PO TABS: ORAL_TABLET | ORAL | 0 refills | Status: DC

## 2021-04-15 NOTE — Progress Notes (Signed)
[facility] [place], [place] [address]  Internal MEDICINE  Office Visit Note  Patient Name: Stacy [name]  [id number]  [id number]  Date of Service: 04/24/2021     Chief Complaint  Patient presents with   Hospitalization Follow-up     HPI Pt is here for recent hospital follow up. Initial admission to [facility] was on 03/07/2021 and discharged on 03/07/2021, patient was sent to assisted living for rehab and now is being sent  home patient was unable to come to the office, her son is here with me in the examination room and the visit is being performed virtually. She is complaining of hip pain and requesting pain management. She is also having cough and would like to have her nebulizer. Her initial admission was for anemia due to vaginal blood loss, however u/s was normal. Pt also had Ecoli UTI which was treated  PMR, she was told to reduce her dose of Plaquenil and prednisone is maintained on low dose. Family didn't bring her meds today    Current Medication: Outpatient Encounter Medications as of 04/15/2021  Medication Sig Note   acetaminophen (TYLENOL) 325 MG tablet Take 650 mg by mouth every 6 (six) hours as needed for pain.    diclofenac Sodium (VOLTAREN) 1 % GEL Apply 2 g topically 4 (four) times daily.    hydroxychloroquine (PLAQUENIL) 200 MG tablet Take 1 tablet by mouth See admin instructions. Takes alternates 1 tablet at bedtime then 2 tablets at night 03/08/2021: 2 tonight    Multiple Vitamin (MULTIVITAMIN WITH MINERALS) TABS tablet Take 1 tablet by mouth daily.    pantoprazole (PROTONIX) 40 MG tablet Take 1 tablet (40 mg total) by mouth 2 (two) times daily.    prednisoLONE 5 MG TABS tablet Take 5 mg by mouth daily.    traMADol (ULTRAM) 50 MG tablet Take one tab po qd with supper    [DISCONTINUED] albuterol (ACCUNEB) 0.63 MG/3ML nebulizer solution One vial 2 x a a day prn for cough and wheezing or sob    [DISCONTINUED] mirtazapine (REMERON SOL-TAB) 15 MG  disintegrating tablet Take 0.5 tablets (7.5 mg total) by mouth at bedtime.    acidophilus (RISAQUAD) CAPS capsule Take 2 capsules by mouth daily.    nystatin (MYCOSTATIN) 100000 UNIT/ML suspension Take 5 mLs (500,000 Units total) by mouth 4 (four) times daily. (Patient not taking: Reported on 04/15/2021)    [DISCONTINUED] gabapentin (NEURONTIN) 100 MG capsule TAKE 1 CAPSULE TWICE DAILY (Patient not taking: No sig reported)    [DISCONTINUED] lidocaine (LIDODERM) 5 % Place 1 patch onto the skin daily. Remove & Discard patch within 12 hours or as directed by MD (Patient not taking: Reported on 04/15/2021)    [DISCONTINUED] polyethylene glycol (MIRALAX / GLYCOLAX) 17 g packet Take 17 g by mouth daily as needed for mild constipation. (Patient not taking: Reported on 04/15/2021)    [DISCONTINUED] predniSONE (DELTASONE) 20 MG tablet Take 1 tablet (20 mg total) by mouth daily with breakfast. (Patient not taking: Reported on 04/15/2021) 04/07/2021: Consulted with Dr. Jefm Bryant he prefers she takes 5mg  daily   No facility-administered encounter medications on file as of 04/15/2021.    Surgical History: Past Surgical History:  Procedure Laterality Date   BREAST BIOPSY Left 2006   +   BREAST SURGERY Left 2006   lumpectomy   [place]   COLON SURGERY  2004   COLONOSCOPY  2014   Dr. Candace Cruise   EYE SURGERY  2002   FOOT  SURGERY  1996   HERNIA REPAIR  2013   left breast biopsy   2011    Medical History: Past Medical History:  Diagnosis Date   Arthritis 1940   Asthma    Bowel trouble    Cancer ([place]) 2006   DCIS left breast   Hypertension 1940   Incisional hernia 2014   Personal history of radiation therapy    Polymyalgia ([place])    Thyroid disorder    Vitiligo 2012    Family History: Family History  Problem Relation Age of Onset   Heart disease Mother    Emphysema Mother    Arthritis Father    Cancer Father    Breast cancer Neg Hx     Social History   Socioeconomic History    Marital status: Married    Spouse name: Not on file   Number of children: Not on file   Years of education: Not on file   Highest education level: Not on file  Occupational History   Not on file  Tobacco Use   Smoking status: Never   Smokeless tobacco: Never  Vaping Use   Vaping Use: Never used  Substance and Sexual Activity   Alcohol use: No   Drug use: No   Sexual activity: Not on file  Other Topics Concern   Not on file  Social History Narrative   Not on file   Social Determinants of Health   Financial Resource Strain: Low Risk    Difficulty of Paying Living Expenses: Not very hard  Food Insecurity: Not on file  Transportation Needs: Not on file  Physical Activity: Not on file  Stress: Not on file  Social Connections: Not on file  Intimate Partner Violence: Not on file      Review of Systems  Constitutional:  Negative for fatigue and fever.  HENT:  Negative for congestion, mouth sores and postnasal drip.   Respiratory:  Positive for cough.   Cardiovascular:  Negative for chest pain.  Genitourinary:  Negative for flank pain.  Musculoskeletal:  Positive for arthralgias, back pain and gait problem.  Psychiatric/Behavioral: Negative.     Vital Signs: There were no vitals taken for this visit.  Pt was able to communicate well      Assessment/Plan: 1. Moderate persistent asthma without complication Will start neb for better delivery of meds  - For home use only DME Nebulizer machine  2. Polymyalgia rheumatica (HCC) Reduced dose of plaquenil, will add tramadol. Need to bring meds on next visit   3. Chronic GERD Continue Protonix as before   4. Acute blood loss anemia Pelvic u/s was neg, however will need follow up with Obgyn    General Counseling: Clayborn Heron understanding of the findings of todays visit and agrees with plan of treatment. I have discussed any further diagnostic evaluation that may be needed or ordered today. We also reviewed her  medications today. she has been encouraged to call the office with any questions or concerns that should arise related to todays visit.    Counseling:  [place] Controlled Substance Database was reviewed by me.  Orders Placed This Encounter  Procedures   For home use only DME Nebulizer machine      I have reviewed all medical records from hospital follow up including radiology reports and consults from other physicians. Appropriate follow up diagnostics will be scheduled as needed. Patient/ Family understands the plan of treatment. Time spent 45 minutes.   Dr [name], MD Internal  Medicine

## 2021-04-16 ENCOUNTER — Encounter: Payer: Self-pay | Admitting: Internal Medicine

## 2021-04-17 ENCOUNTER — Encounter: Payer: Self-pay | Admitting: Internal Medicine

## 2021-04-20 ENCOUNTER — Encounter: Payer: Self-pay | Admitting: Internal Medicine

## 2021-04-20 ENCOUNTER — Other Ambulatory Visit: Payer: Self-pay

## 2021-04-20 DIAGNOSIS — M353 Polymyalgia rheumatica: Secondary | ICD-10-CM | POA: Diagnosis not present

## 2021-04-20 DIAGNOSIS — D62 Acute posthemorrhagic anemia: Secondary | ICD-10-CM | POA: Diagnosis not present

## 2021-04-20 DIAGNOSIS — M25552 Pain in left hip: Secondary | ICD-10-CM | POA: Diagnosis not present

## 2021-04-20 DIAGNOSIS — N939 Abnormal uterine and vaginal bleeding, unspecified: Secondary | ICD-10-CM | POA: Diagnosis not present

## 2021-04-20 DIAGNOSIS — M1612 Unilateral primary osteoarthritis, left hip: Secondary | ICD-10-CM | POA: Diagnosis not present

## 2021-04-20 DIAGNOSIS — E440 Moderate protein-calorie malnutrition: Secondary | ICD-10-CM | POA: Diagnosis not present

## 2021-04-20 MED ORDER — IPRATROPIUM-ALBUTEROL 0.5-2.5 (3) MG/3ML IN SOLN: RESPIRATORY_TRACT | 12 refills | Status: DC

## 2021-04-20 MED ORDER — ALBUTEROL SULFATE 0.63 MG/3ML IN NEBU: INHALATION_SOLUTION | RESPIRATORY_TRACT | 12 refills | Status: DC

## 2021-04-20 NOTE — Telephone Encounter (Signed)
Call in neb machine and duoneb. 2 x day as needed

## 2021-04-23 ENCOUNTER — Telehealth: Payer: Self-pay

## 2021-04-23 DIAGNOSIS — N939 Abnormal uterine and vaginal bleeding, unspecified: Secondary | ICD-10-CM | POA: Diagnosis not present

## 2021-04-23 DIAGNOSIS — M1612 Unilateral primary osteoarthritis, left hip: Secondary | ICD-10-CM | POA: Diagnosis not present

## 2021-04-23 DIAGNOSIS — M353 Polymyalgia rheumatica: Secondary | ICD-10-CM | POA: Diagnosis not present

## 2021-04-23 DIAGNOSIS — M25552 Pain in left hip: Secondary | ICD-10-CM | POA: Diagnosis not present

## 2021-04-23 DIAGNOSIS — D62 Acute posthemorrhagic anemia: Secondary | ICD-10-CM | POA: Diagnosis not present

## 2021-04-23 DIAGNOSIS — E440 Moderate protein-calorie malnutrition: Secondary | ICD-10-CM | POA: Diagnosis not present

## 2021-04-23 NOTE — Telephone Encounter (Signed)
Spoke with pt son that she can use neb med as needed for cough

## 2021-04-23 NOTE — Telephone Encounter (Signed)
Received paperwork from [facility]. Sent to dfk for review and signature-Toni

## 2021-04-25 DIAGNOSIS — I10 Essential (primary) hypertension: Secondary | ICD-10-CM | POA: Diagnosis not present

## 2021-04-25 DIAGNOSIS — J449 Chronic obstructive pulmonary disease, unspecified: Secondary | ICD-10-CM | POA: Diagnosis not present

## 2021-04-25 DIAGNOSIS — I509 Heart failure, unspecified: Secondary | ICD-10-CM | POA: Diagnosis not present

## 2021-04-29 DIAGNOSIS — M1612 Unilateral primary osteoarthritis, left hip: Secondary | ICD-10-CM | POA: Diagnosis not present

## 2021-04-29 DIAGNOSIS — E440 Moderate protein-calorie malnutrition: Secondary | ICD-10-CM | POA: Diagnosis not present

## 2021-04-29 DIAGNOSIS — M25552 Pain in left hip: Secondary | ICD-10-CM | POA: Diagnosis not present

## 2021-04-29 DIAGNOSIS — N939 Abnormal uterine and vaginal bleeding, unspecified: Secondary | ICD-10-CM | POA: Diagnosis not present

## 2021-04-29 DIAGNOSIS — M353 Polymyalgia rheumatica: Secondary | ICD-10-CM | POA: Diagnosis not present

## 2021-04-29 DIAGNOSIS — D62 Acute posthemorrhagic anemia: Secondary | ICD-10-CM | POA: Diagnosis not present

## 2021-04-30 DIAGNOSIS — E440 Moderate protein-calorie malnutrition: Secondary | ICD-10-CM | POA: Diagnosis not present

## 2021-04-30 DIAGNOSIS — M353 Polymyalgia rheumatica: Secondary | ICD-10-CM | POA: Diagnosis not present

## 2021-04-30 DIAGNOSIS — N939 Abnormal uterine and vaginal bleeding, unspecified: Secondary | ICD-10-CM | POA: Diagnosis not present

## 2021-04-30 DIAGNOSIS — M1612 Unilateral primary osteoarthritis, left hip: Secondary | ICD-10-CM | POA: Diagnosis not present

## 2021-04-30 DIAGNOSIS — D62 Acute posthemorrhagic anemia: Secondary | ICD-10-CM | POA: Diagnosis not present

## 2021-04-30 DIAGNOSIS — M25552 Pain in left hip: Secondary | ICD-10-CM | POA: Diagnosis not present

## 2021-05-05 ENCOUNTER — Encounter: Payer: Self-pay | Admitting: Internal Medicine

## 2021-05-05 DIAGNOSIS — M1612 Unilateral primary osteoarthritis, left hip: Secondary | ICD-10-CM | POA: Diagnosis not present

## 2021-05-05 DIAGNOSIS — N939 Abnormal uterine and vaginal bleeding, unspecified: Secondary | ICD-10-CM | POA: Diagnosis not present

## 2021-05-05 DIAGNOSIS — M25552 Pain in left hip: Secondary | ICD-10-CM | POA: Diagnosis not present

## 2021-05-05 DIAGNOSIS — E440 Moderate protein-calorie malnutrition: Secondary | ICD-10-CM | POA: Diagnosis not present

## 2021-05-05 DIAGNOSIS — M353 Polymyalgia rheumatica: Secondary | ICD-10-CM | POA: Diagnosis not present

## 2021-05-05 DIAGNOSIS — D62 Acute posthemorrhagic anemia: Secondary | ICD-10-CM | POA: Diagnosis not present

## 2021-05-09 DIAGNOSIS — M353 Polymyalgia rheumatica: Secondary | ICD-10-CM | POA: Diagnosis not present

## 2021-05-09 DIAGNOSIS — E1122 Type 2 diabetes mellitus with diabetic chronic kidney disease: Secondary | ICD-10-CM | POA: Diagnosis not present

## 2021-05-09 DIAGNOSIS — D62 Acute posthemorrhagic anemia: Secondary | ICD-10-CM | POA: Diagnosis not present

## 2021-05-09 DIAGNOSIS — I130 Hypertensive heart and chronic kidney disease with heart failure and stage 1 through stage 4 chronic kidney disease, or unspecified chronic kidney disease: Secondary | ICD-10-CM | POA: Diagnosis not present

## 2021-05-09 DIAGNOSIS — Z853 Personal history of malignant neoplasm of breast: Secondary | ICD-10-CM | POA: Diagnosis not present

## 2021-05-09 DIAGNOSIS — S81802D Unspecified open wound, left lower leg, subsequent encounter: Secondary | ICD-10-CM | POA: Diagnosis not present

## 2021-05-09 DIAGNOSIS — J45909 Unspecified asthma, uncomplicated: Secondary | ICD-10-CM | POA: Diagnosis not present

## 2021-05-09 DIAGNOSIS — N939 Abnormal uterine and vaginal bleeding, unspecified: Secondary | ICD-10-CM | POA: Diagnosis not present

## 2021-05-09 DIAGNOSIS — I509 Heart failure, unspecified: Secondary | ICD-10-CM | POA: Diagnosis not present

## 2021-05-09 DIAGNOSIS — I471 Supraventricular tachycardia: Secondary | ICD-10-CM | POA: Diagnosis not present

## 2021-05-09 DIAGNOSIS — J449 Chronic obstructive pulmonary disease, unspecified: Secondary | ICD-10-CM | POA: Diagnosis not present

## 2021-05-09 DIAGNOSIS — M1612 Unilateral primary osteoarthritis, left hip: Secondary | ICD-10-CM | POA: Diagnosis not present

## 2021-05-09 DIAGNOSIS — M25552 Pain in left hip: Secondary | ICD-10-CM | POA: Diagnosis not present

## 2021-05-09 DIAGNOSIS — R1311 Dysphagia, oral phase: Secondary | ICD-10-CM | POA: Diagnosis not present

## 2021-05-09 DIAGNOSIS — E039 Hypothyroidism, unspecified: Secondary | ICD-10-CM | POA: Diagnosis not present

## 2021-05-09 DIAGNOSIS — E1140 Type 2 diabetes mellitus with diabetic neuropathy, unspecified: Secondary | ICD-10-CM | POA: Diagnosis not present

## 2021-05-09 DIAGNOSIS — Z7952 Long term (current) use of systemic steroids: Secondary | ICD-10-CM | POA: Diagnosis not present

## 2021-05-09 DIAGNOSIS — N189 Chronic kidney disease, unspecified: Secondary | ICD-10-CM | POA: Diagnosis not present

## 2021-05-09 DIAGNOSIS — K219 Gastro-esophageal reflux disease without esophagitis: Secondary | ICD-10-CM | POA: Diagnosis not present

## 2021-05-09 DIAGNOSIS — E440 Moderate protein-calorie malnutrition: Secondary | ICD-10-CM | POA: Diagnosis not present

## 2021-05-10 ENCOUNTER — Other Ambulatory Visit: Payer: Self-pay

## 2021-05-10 ENCOUNTER — Telehealth (INDEPENDENT_AMBULATORY_CARE_PROVIDER_SITE_OTHER): Payer: Medicare Other | Admitting: Internal Medicine

## 2021-05-10 ENCOUNTER — Encounter: Payer: Self-pay | Admitting: Internal Medicine

## 2021-05-10 VITALS — BP 104/64 | HR 83 | Temp 97.8°F | Resp 16 | Ht 64.0 in | Wt 155.0 lb

## 2021-05-10 DIAGNOSIS — M25449 Effusion, unspecified hand: Secondary | ICD-10-CM | POA: Diagnosis not present

## 2021-05-10 DIAGNOSIS — M353 Polymyalgia rheumatica: Secondary | ICD-10-CM | POA: Diagnosis not present

## 2021-05-10 DIAGNOSIS — M25476 Effusion, unspecified foot: Secondary | ICD-10-CM | POA: Diagnosis not present

## 2021-05-10 MED ORDER — MELOXICAM 7.5 MG PO TABS: ORAL_TABLET | ORAL | 0 refills | Status: DC

## 2021-05-10 NOTE — Progress Notes (Signed)
[facility] [place], [place] [address]  Internal MEDICINE  Telephone Visit  Patient Name: Stacy [name]  [id number]  [id number]  Date of Service: 05/13/2021  I connected with the patient at 1042 by telephone and verified the patients identity using two identifiers.   I discussed the limitations, risks, security and privacy concerns of performing an evaluation and management service by telephone and the availability of in person appointments. I also discussed with the patient that there may be a patient responsible charge related to the service.  The patient expressed understanding and agrees to proceed.    Chief Complaint  Patient presents with   Acute Visit    Swollen foot left, left knee is also swollen, left arm, hand, and fingers also look swollen    Telephone Assessment    Video call    Telephone Screen    [phone]    HPI  Pt is connected via video visit for acute problem Left foot and hand is swollen for last few days, denies any pain. Pt does have h/o PMR and is maintained on low dose prednisone and hydroxychloroquine. She is not on any NSAIDS due to hypersensitive to ASA products She has taken mobic in the past Son is able to put pressure on her calf with no tenderness to mild   Current Medication: Outpatient Encounter Medications as of 05/10/2021  Medication Sig Note   acetaminophen (TYLENOL) 325 MG tablet Take 650 mg by mouth every 6 (six) hours as needed for pain.    acidophilus (RISAQUAD) CAPS capsule Take 2 capsules by mouth daily.    diclofenac Sodium (VOLTAREN) 1 % GEL Apply 2 g topically 4 (four) times daily.    hydroxychloroquine (PLAQUENIL) 200 MG tablet Take 1 tablet by mouth See admin instructions. Takes alternates 1 tablet at bedtime then 2 tablets at night 03/08/2021: 2 tonight    ipratropium-albuterol (DUONEB) 0.5-2.5 (3) MG/3ML SOLN One vial 2 x a a day prn for cough and wheezing or sob    meloxicam (MOBIC) 7.5 MG tablet TAKE ONE  TAB PO BID WITH FOOD PRN    Multiple Vitamin (MULTIVITAMIN WITH MINERALS) TABS tablet Take 1 tablet by mouth daily.    pantoprazole (PROTONIX) 40 MG tablet Take 1 tablet (40 mg total) by mouth 2 (two) times daily.    prednisoLONE 5 MG TABS tablet Take 5 mg by mouth daily.    traMADol (ULTRAM) 50 MG tablet Take one tab po qd with supper    [DISCONTINUED] gabapentin (NEURONTIN) 100 MG capsule TAKE 1 CAPSULE TWICE DAILY (Patient not taking: No sig reported)    No facility-administered encounter medications on file as of 05/10/2021.    Surgical History: Past Surgical History:  Procedure Laterality Date   BREAST BIOPSY Left 2006   +   BREAST SURGERY Left 2006   lumpectomy   [place]   COLON SURGERY  2004   COLONOSCOPY  2014   Dr[facility]   EYE SURGERY  2002   FOOT SURGERY  1996   HERNIA REPAIR  2013   left breast biopsy   2011    Medical History: Past Medical History:  Diagnosis Date   Arthritis 1940   Asthma    Bowel trouble    Cancer ([place]) 2006   DCIS left breast   Hypertension 1940   Incisional hernia 2014   Personal history of radiation therapy    Polymyalgia ([place])    Thyroid disorder    Vitiligo 2012  Family History: Family History  Problem Relation Age of Onset   Heart disease Mother    Emphysema Mother    Arthritis Father    Cancer Father    Breast cancer Neg Hx     Social History   Socioeconomic History   Marital status: Married    Spouse name: Not on file   Number of children: Not on file   Years of education: Not on file   Highest education level: Not on file  Occupational History   Not on file  Tobacco Use   Smoking status: Never   Smokeless tobacco: Never  Vaping Use   Vaping Use: Never used  Substance and Sexual Activity   Alcohol use: No   Drug use: No   Sexual activity: Not on file  Other Topics Concern   Not on file  Social History Narrative   Not on file   Social Determinants of Health   Financial Resource Strain: Low  Risk    Difficulty of Paying Living Expenses: Not very hard  Food Insecurity: Not on file  Transportation Needs: Not on file  Physical Activity: Not on file  Stress: Not on file  Social Connections: Not on file  Intimate Partner Violence: Not on file      Review of Systems  Constitutional:  Negative for fatigue and fever.  HENT:  Negative for congestion, mouth sores and postnasal drip.   Respiratory:  Negative for cough.   Cardiovascular:  Negative for chest pain.  Genitourinary:  Negative for flank pain.  Musculoskeletal:  Positive for joint swelling.  Skin:  Positive for color change.  Psychiatric/Behavioral: Negative.     Vital Signs: BP 104/64   Pulse 83   Temp 97.8 F (36.6 C)   Resp 16   Ht 5' 4"  (1.626 m)   Wt 155 lb (70.3 kg)   BMI 26.61 kg/m    Observation/Objective: Pt is able to communicate well     Assessment/Plan: 1. Polymyalgia rheumatica (HCC) Pt was instructed to increase prednisone to 10 mg bid for 3 days, will monitor repsonse - CBC with Differential/Platelet - Sed Rate (ESR) - Basic Metabolic Panel (BMET)  2. Soft tissue swelling of joint of finger Does not look like cellulitis, however add mobic and monitor response  - meloxicam (MOBIC) 7.5 MG tablet; TAKE ONE TAB PO BID WITH FOOD PRN  Dispense: 30 tablet; Refill: 0  3. Soft tissue swelling of joint of foot  Does not look like cellulitis, however add mobic and monitor response, add sed rate   General Counseling: Teighan verbalizes understanding of the findings of today's phone visit and agrees with plan of treatment. I have discussed any further diagnostic evaluation that may be needed or ordered today. We also reviewed her medications today. she has been encouraged to call the office with any questions or concerns that should arise related to todays visit.    No orders of the defined types were placed in this encounter.   Meds ordered this encounter  Medications   meloxicam (MOBIC) 7.5  MG tablet    Sig: TAKE ONE TAB PO BID WITH FOOD PRN    Dispense:  30 tablet    Refill:  0     Time spent:25 Minutes    Dr [name] Internal medicine

## 2021-05-11 DIAGNOSIS — E440 Moderate protein-calorie malnutrition: Secondary | ICD-10-CM | POA: Diagnosis not present

## 2021-05-11 DIAGNOSIS — N939 Abnormal uterine and vaginal bleeding, unspecified: Secondary | ICD-10-CM | POA: Diagnosis not present

## 2021-05-11 DIAGNOSIS — M1612 Unilateral primary osteoarthritis, left hip: Secondary | ICD-10-CM | POA: Diagnosis not present

## 2021-05-11 DIAGNOSIS — M353 Polymyalgia rheumatica: Secondary | ICD-10-CM | POA: Diagnosis not present

## 2021-05-11 DIAGNOSIS — D62 Acute posthemorrhagic anemia: Secondary | ICD-10-CM | POA: Diagnosis not present

## 2021-05-11 DIAGNOSIS — M25552 Pain in left hip: Secondary | ICD-10-CM | POA: Diagnosis not present

## 2021-05-12 ENCOUNTER — Telehealth: Payer: Self-pay

## 2021-05-12 NOTE — Telephone Encounter (Signed)
Spoke to pt's son and informed him that [facility] wants Stacy [name] to take one Oscal-D daily (combo of calcium and vitamin D). Son understood.

## 2021-05-12 NOTE — Telephone Encounter (Signed)
-----   Message from [name], MD sent at 05/11/2021 10:57 PM EDT ----- She needs to take one tab of calcium and vitd qd. He can get a combo ( both on one) like oscal D ----- Message ----- From: Edd Arbour, CMA Sent: 05/11/2021   9:44 AM EDT To: [name], MD  Son called back and stated that pt had issues with Vit D a long time ago and thinks it caused her constipation.  He also said pt doesn't take calcium either.  He said it would be worth a try if you wanted pt to take again because she takes a stool softener twice a day now.  Pt also takes a multivitamin.   ----- Message ----- From: [name], MD Sent: 05/10/2021  10:52 AM EDT To: Edd Arbour, CMA  Can u call her son and ask if she is taking any calcium and vitd

## 2021-05-17 ENCOUNTER — Ambulatory Visit: Payer: Medicare Other | Admitting: Physician Assistant

## 2021-05-27 ENCOUNTER — Telehealth: Payer: Self-pay

## 2021-05-27 DIAGNOSIS — D62 Acute posthemorrhagic anemia: Secondary | ICD-10-CM | POA: Diagnosis not present

## 2021-05-27 DIAGNOSIS — E440 Moderate protein-calorie malnutrition: Secondary | ICD-10-CM | POA: Diagnosis not present

## 2021-05-27 DIAGNOSIS — M353 Polymyalgia rheumatica: Secondary | ICD-10-CM | POA: Diagnosis not present

## 2021-05-27 DIAGNOSIS — M25552 Pain in left hip: Secondary | ICD-10-CM | POA: Diagnosis not present

## 2021-05-27 DIAGNOSIS — M1612 Unilateral primary osteoarthritis, left hip: Secondary | ICD-10-CM | POA: Diagnosis not present

## 2021-05-27 DIAGNOSIS — N939 Abnormal uterine and vaginal bleeding, unspecified: Secondary | ICD-10-CM | POA: Diagnosis not present

## 2021-05-27 NOTE — Telephone Encounter (Signed)
Stacy [name] from [name] advising that pt had a fall last week in the bathroom and everything is ok but she had some minor bruising and bleeding to right elbow.  He has to let us know.

## 2021-05-27 NOTE — Telephone Encounter (Signed)
okay

## 2021-05-28 DIAGNOSIS — M1612 Unilateral primary osteoarthritis, left hip: Secondary | ICD-10-CM | POA: Diagnosis not present

## 2021-05-28 DIAGNOSIS — D62 Acute posthemorrhagic anemia: Secondary | ICD-10-CM | POA: Diagnosis not present

## 2021-05-28 DIAGNOSIS — N939 Abnormal uterine and vaginal bleeding, unspecified: Secondary | ICD-10-CM | POA: Diagnosis not present

## 2021-05-28 DIAGNOSIS — M25552 Pain in left hip: Secondary | ICD-10-CM | POA: Diagnosis not present

## 2021-05-28 DIAGNOSIS — E440 Moderate protein-calorie malnutrition: Secondary | ICD-10-CM | POA: Diagnosis not present

## 2021-05-28 DIAGNOSIS — M353 Polymyalgia rheumatica: Secondary | ICD-10-CM | POA: Diagnosis not present

## 2021-05-30 ENCOUNTER — Encounter: Payer: Self-pay | Admitting: Internal Medicine

## 2021-05-31 ENCOUNTER — Other Ambulatory Visit: Payer: Self-pay | Admitting: Internal Medicine

## 2021-05-31 DIAGNOSIS — M353 Polymyalgia rheumatica: Secondary | ICD-10-CM

## 2021-05-31 MED ORDER — GABAPENTIN 100 MG PO CAPS: ORAL_CAPSULE | ORAL | 1 refills | Status: DC

## 2021-05-31 NOTE — Telephone Encounter (Signed)
Can u check and see if the rx went, I have changed direction

## 2021-06-02 ENCOUNTER — Other Ambulatory Visit: Payer: Self-pay

## 2021-06-02 DIAGNOSIS — M353 Polymyalgia rheumatica: Secondary | ICD-10-CM

## 2021-06-02 MED ORDER — GABAPENTIN 100 MG PO CAPS: ORAL_CAPSULE | ORAL | 1 refills | Status: DC

## 2021-06-02 NOTE — Telephone Encounter (Signed)
Spoke to son (gene) and he advised that the mail order was working on the Gabapentin rx.  Son advised that [facility] told pt to take: 2 in the morning  1 in afternoon  2 at night  Resent prescription for Gabapentin for 90 days at 540 capsules

## 2021-06-03 DIAGNOSIS — M353 Polymyalgia rheumatica: Secondary | ICD-10-CM | POA: Diagnosis not present

## 2021-06-03 DIAGNOSIS — N939 Abnormal uterine and vaginal bleeding, unspecified: Secondary | ICD-10-CM | POA: Diagnosis not present

## 2021-06-03 DIAGNOSIS — M1612 Unilateral primary osteoarthritis, left hip: Secondary | ICD-10-CM | POA: Diagnosis not present

## 2021-06-03 DIAGNOSIS — M25552 Pain in left hip: Secondary | ICD-10-CM | POA: Diagnosis not present

## 2021-06-03 DIAGNOSIS — E440 Moderate protein-calorie malnutrition: Secondary | ICD-10-CM | POA: Diagnosis not present

## 2021-06-03 DIAGNOSIS — D62 Acute posthemorrhagic anemia: Secondary | ICD-10-CM | POA: Diagnosis not present

## 2021-06-28 ENCOUNTER — Telehealth: Payer: Self-pay

## 2021-06-30 ENCOUNTER — Other Ambulatory Visit: Payer: Self-pay

## 2021-06-30 MED ORDER — HYDROXYCHLOROQUINE SULFATE 200 MG PO TABS: ORAL_TABLET | ORAL | 0 refills | Status: DC

## 2021-06-30 MED ORDER — HYDROXYCHLOROQUINE SULFATE 200 MG PO TABS: ORAL_TABLET | ORAL | 1 refills | Status: DC

## 2021-06-30 NOTE — Telephone Encounter (Signed)
Spoke to Dr Humphrey Rolls and straightened medication questions out.

## 2021-07-06 ENCOUNTER — Telehealth: Payer: Self-pay | Admitting: Pharmacist

## 2021-07-06 NOTE — Progress Notes (Addendum)
    Chronic Care Management Pharmacy Assistant   Name: Stacy [name]  MRN: [id number] DOB: [birth year]  Reason for Encounter: General Disease State Call   Conditions to be addressed/monitored: HTN, Hypothyroidism, Type II DM, COPD, Polymyalgia rheumatica  Recent office visits:  05/10/21 (Video Visit) [facility] For acute Visit. STARTED Meloxicam 7.5 mg 1 tablet twice daily with food PRN.  04/15/21 [facility] follow-up. STARTED Albuterol Sulfate 0.63 MG/3ML One vial 2 x a a day prn for cough and wheezing or sob and Tramadol 50 mg Take one tab po qd with supper STOPPED Gabapentin, Lidocaine, Mirtazapine, Nystatin, Polyethylene and Prednisone.   Recent consult visits:  None since 04/07/21  Hospital visits:  None since 04/07/21  Medications: Outpatient Encounter Medications as of 07/06/2021  Medication Sig   acetaminophen (TYLENOL) 325 MG tablet Take 650 mg by mouth every 6 (six) hours as needed for pain.   acidophilus (RISAQUAD) CAPS capsule Take 2 capsules by mouth daily.   diclofenac Sodium (VOLTAREN) 1 % GEL Apply 2 g topically 4 (four) times daily.   gabapentin (NEURONTIN) 100 MG capsule Take 2 tabs 3 x day for neuropathy by mouth   hydroxychloroquine (PLAQUENIL) 200 MG tablet Take 1 tablet by mouth twice a day   ipratropium-albuterol (DUONEB) 0.5-2.5 (3) MG/3ML SOLN One vial 2 x a a day prn for cough and wheezing or sob   meloxicam (MOBIC) 7.5 MG tablet TAKE ONE TAB PO BID WITH FOOD PRN   Multiple Vitamin (MULTIVITAMIN WITH MINERALS) TABS tablet Take 1 tablet by mouth daily.   pantoprazole (PROTONIX) 40 MG tablet Take 1 tablet (40 mg total) by mouth 2 (two) times daily.   prednisoLONE 5 MG TABS tablet Take 5 mg by mouth daily.   traMADol (ULTRAM) 50 MG tablet Take one tab po qd with supper   No facility-administered encounter medications on file as of 07/06/2021.   GEN CALL: Patient stated her legs have been hurting more and more and is not sure why this happening. She thinks  its the cause of her medications but she's not sure. She stated she eats well and drinks enough of fluids. She has asked for a curiosity call from the pharmacist but she does have a follow-up appointment with the doctor on 07/14/21 at 340 pm.   Care Gaps:Not on the list.  Star Rating Drugs:None.   Follow-Up:Pharmacist Review  Charlann Lange, RMA Clinical Pharmacist Assistant [phone]  7 minutes spent in review, coordination, and documentation.  Reviewed by: Beverly Milch, PharmD Clinical Pharmacist [phone]

## 2021-07-07 ENCOUNTER — Other Ambulatory Visit: Payer: Self-pay

## 2021-07-07 MED ORDER — HYDROXYCHLOROQUINE SULFATE 200 MG PO TABS: ORAL_TABLET | ORAL | 1 refills | Status: DC

## 2021-07-14 ENCOUNTER — Ambulatory Visit: Payer: Medicare Other | Admitting: Nurse Practitioner

## 2021-07-22 ENCOUNTER — Telehealth: Payer: Self-pay

## 2021-07-22 NOTE — Telephone Encounter (Signed)
Plan of care signed by [facility] and placed in [facility] folder for pick up. Rep advised ready.

## 2021-07-26 DIAGNOSIS — I509 Heart failure, unspecified: Secondary | ICD-10-CM | POA: Diagnosis not present

## 2021-07-26 DIAGNOSIS — I10 Essential (primary) hypertension: Secondary | ICD-10-CM | POA: Diagnosis not present

## 2021-07-26 DIAGNOSIS — J449 Chronic obstructive pulmonary disease, unspecified: Secondary | ICD-10-CM | POA: Diagnosis not present

## 2021-08-04 ENCOUNTER — Telehealth: Payer: Self-pay

## 2021-08-04 NOTE — Telephone Encounter (Signed)
Son called and was requesting an xray of the hip and leg on patient, I spoke to [facility] to see if she could order the imaging and she advised it would be best for pt to get from the orthopedic dr, spoke to daughter Sonia Baller and gave information and told to let us know if a referral was needed

## 2021-08-06 DIAGNOSIS — M1612 Unilateral primary osteoarthritis, left hip: Secondary | ICD-10-CM | POA: Diagnosis not present

## 2021-08-10 ENCOUNTER — Encounter: Payer: Self-pay | Admitting: Internal Medicine

## 2021-08-11 ENCOUNTER — Other Ambulatory Visit: Payer: Self-pay

## 2021-08-11 MED ORDER — PANTOPRAZOLE SODIUM 40 MG PO TBEC: 40.0000 mg | DELAYED_RELEASE_TABLET | Freq: Two times a day (BID) | ORAL | 1 refills | Status: DC

## 2021-08-14 ENCOUNTER — Other Ambulatory Visit: Payer: Self-pay | Admitting: Internal Medicine

## 2021-08-16 ENCOUNTER — Other Ambulatory Visit: Payer: Self-pay

## 2021-08-16 MED ORDER — TRAMADOL HCL 50 MG PO TABS
ORAL_TABLET | ORAL | 2 refills | Status: DC
  Filled 2021-08-16: qty 30, 30d supply, fill #0
  Filled 2021-08-17: qty 90, 90d supply, fill #0

## 2021-08-17 ENCOUNTER — Other Ambulatory Visit: Payer: Self-pay

## 2021-08-23 ENCOUNTER — Other Ambulatory Visit: Payer: Self-pay

## 2021-08-24 ENCOUNTER — Other Ambulatory Visit: Payer: Self-pay

## 2021-09-01 ENCOUNTER — Telehealth: Payer: Self-pay

## 2021-09-01 NOTE — Telephone Encounter (Signed)
Oxygen therapy order signed by provider and placed in [facility] folder. 

## 2021-10-07 ENCOUNTER — Telehealth: Payer: Self-pay | Admitting: Student-PharmD

## 2021-10-07 NOTE — Progress Notes (Addendum)
General Review Call   Samara,Sadeen  [id number] 26 years, Female  DOB: June 26, 1927  M: [phone]  General Review [facility]) Completed by Charlann Lange on 10/07/2021  Chart Review:  What recent interventions/DTPs have been made by any provider to improve the patient's conditions in the last 3 months?:   Consults: 08/06/21  Orthopedic Surgery Gwenlyn Fudge, PA  For Primary osteoarthritis of left hip. STOPPED Lisinopril, Montelukast, Tizanidine.  Any recent hospitalizations or ED visits since last visit with CPP?: No  Adherence Review Adherence rates for STAR metric medications: None.  Adherence rates for medications indicated for disease state being reviewed: None.  Does the patient have >5 day gap between last estimated fill dates for any of the above medications?: No  Disease State Questions Able to connect with the Patient?: Yes  Did patient have any problems with their health recently?: No  Did patient have any problems with their pharmacy?: No  Does patient have any issues or side effects with their medications?: No  Additional  information to pass to Patient's CPP?: No  Anything we can do to help take better care of Patient?: No  Charlann Lange, RMA Clinical Pharmacist Assistant [phone]  Clinical Lead Review Adherence gaps identified?: No Drug Therapy Problems identified?: No Assessment: Controlled  5 minutes spent in review, coordination, and documentation.  Reviewed by: Alena Bills, PharmD Clinical Pharmacist [phone]

## 2021-10-24 DIAGNOSIS — J449 Chronic obstructive pulmonary disease, unspecified: Secondary | ICD-10-CM | POA: Diagnosis not present

## 2021-10-24 DIAGNOSIS — I10 Essential (primary) hypertension: Secondary | ICD-10-CM | POA: Diagnosis not present

## 2021-10-24 DIAGNOSIS — I509 Heart failure, unspecified: Secondary | ICD-10-CM | POA: Diagnosis not present

## 2021-11-10 ENCOUNTER — Telehealth: Payer: Self-pay

## 2021-11-10 ENCOUNTER — Other Ambulatory Visit: Payer: Self-pay

## 2021-11-10 ENCOUNTER — Encounter: Payer: Self-pay | Admitting: Internal Medicine

## 2021-11-10 ENCOUNTER — Ambulatory Visit: Payer: Medicare Other | Admitting: Student-PharmD

## 2021-11-10 DIAGNOSIS — M353 Polymyalgia rheumatica: Secondary | ICD-10-CM

## 2021-11-10 DIAGNOSIS — J449 Chronic obstructive pulmonary disease, unspecified: Secondary | ICD-10-CM

## 2021-11-10 DIAGNOSIS — M25449 Effusion, unspecified hand: Secondary | ICD-10-CM

## 2021-11-10 DIAGNOSIS — I10 Essential (primary) hypertension: Secondary | ICD-10-CM

## 2021-11-10 MED ORDER — GABAPENTIN 100 MG PO CAPS: ORAL_CAPSULE | ORAL | 1 refills | Status: DC

## 2021-11-10 MED ORDER — RISAQUAD PO CAPS: 2.0000 | ORAL_CAPSULE | Freq: Every day | ORAL | 1 refills | Status: DC

## 2021-11-10 MED ORDER — HYDROXYCHLOROQUINE SULFATE 200 MG PO TABS: ORAL_TABLET | ORAL | 1 refills | Status: DC

## 2021-11-10 MED ORDER — PANTOPRAZOLE SODIUM 40 MG PO TBEC: 40.0000 mg | DELAYED_RELEASE_TABLET | Freq: Two times a day (BID) | ORAL | 1 refills | Status: DC

## 2021-11-10 MED ORDER — IPRATROPIUM-ALBUTEROL 0.5-2.5 (3) MG/3ML IN SOLN: RESPIRATORY_TRACT | 12 refills | Status: DC

## 2021-11-10 MED ORDER — DICLOFENAC SODIUM 1 % EX GEL: 2.0000 g | Freq: Four times a day (QID) | CUTANEOUS | 3 refills | Status: DC

## 2021-11-10 MED ORDER — MELOXICAM 7.5 MG PO TABS: ORAL_TABLET | ORAL | 1 refills | Status: DC

## 2021-11-10 NOTE — Progress Notes (Signed)
Follow Up Pharmacist Visit   Stacy [name], Stacy [name] [id number] 65 years, Female  DOB: [birth year]  M: [phone]  Clinical Summary Patient Risk: Moderate Next CCM Follow Up: - Next AWV: - Next PCP Visit: - Summary for PCP: Patient presents with son via telephone. Primary concern at this time is insurance has changed to [facility] and need all prescriptions sent to [employer]. Sent a message to staff but also reminded patient/son that an appt with PCP needs to be scheduled. Recommended patient check BP once weekly with home device.  Patient Chart Prep  Completed by Charlann Lange on 11/08/2021  Chronic Conditions Patient's Chronic Conditions: Hypertension (HTN), Cardiovascular Disease (CVD), Chronic Obstructive Pulmonary Disease (COPD), Allergic rhinitis, Hypothyroidism, Osteopenia or Osteoporosis, Hyperlipidemia/Dyslipidemia (HLD)  Doctor and Hospital Visits Were there PCP Visits since last visit with the Pharmacist?: No Were there Specialist Visits since last visit with the Pharmacist?: No Was there a Hospital Visit in last 30 days?: No Were there other Hospital Visits since last visit with the Pharmacist?: No  Medication Information Have there been any medication changes from PCP or Specialist since last visit with the Pharmacist?: No Are there any Medication adherence gaps (beyond 5 days past due)?: No Medication adherence rates for the STAR rating drugs: None. List Patient's current Care Gaps: Need A1c Documented  Pre-Call Questions Are you able to connect with Patient: Yes Confirmed appointment date/time with patient/caregiver?: Yes Date/time of the appointment: 11/10/21 at 11:00 AM [phone] Visit type: Phone Patient/Caregiver instructed to bring medications to appointment: Yes What, if any, problems do you have getting your medications from the pharmacy?: None What is your top health concern to discuss at your upcoming visit?: Patient stated her shoulders, legs, and hips  are still bothering her. Have you seen any other providers since your last visit?: No  Disease Assessments  Subjective Information Current BP: 104/64 Current HR: 83 taken on: 05/09/2021 Weight: 155 BMI: 26.61 Last GFR: 46 taken on: 03/11/2021 Why did the patient present?: CCM F/U Visit Any additional demeanor/mood notes?: Patient presents via phone for visit, with her son.  Hypertension (HTN) Assess this condition today?: Yes Is patient able to obtain BP reading today?: No Goal: <140/90 mmHG Hypertension Stage: Normal (SBP <120 and DBP < 80) Is Patient checking BP at home?: No How often does patient miss taking their blood pressure medications?: not on medications Has patient experienced hypotension, dizziness, falls or bradycardia?: No Does Patient use RPM device?: No We discussed: Proper Home BP Measurement Assessment:: Controlled Drug: No medications Assessment: Appropriate, Effective, Safe, Accessible Additional Info: Patient is currently not taking any blood pressure medications. Blood pressure has been well-controlled in office. Patient does have a BP device at home. Recommended patient check blood pressure at least once a week to keep track. Plan to: Begin weekly home BP monitoring HC Follow up: 4 months Pharmacist Follow up: 8 months  Chronic Obstructive Pulmonary Disease (COPD) Current Eosinophils: 4 taken on: 03/06/2021 Assess this condition today?: Yes Gold group: A (low sx, < 2 exacerbations / yr) Exacerbations since last visit with pharmacist?: No Has there been change in patients smoking/vaping habit since last visit with pharmacist?: No Frequency of SABA/SAMA use: Infrequently Assessment:: Controlled Drug: Duoneb as needed Assessment: Appropriate, Effective, Safe, Accessible Plan to: Continue medication therapy Pharmacist Follow up: 8 months  [facility]-Related Social Needs Screening Tool -  SDOH  What is your living  situation today? (ref #1): I have a steady place to live Think about the  place you live. Do you have problems with any of the following? (ref #2): None of the above Within the past 12 months, you worried that your food would run out before you got money to buy more (ref #3): Never true Within the past 12 months, the food you bought just didn't last and you didn't have money to get more (ref #4): Never true In the past 12 months, has lack of reliable transportation kept you from medical appointments, meetings, work or from getting things needed for daily living? (ref #5): No In the past 12 months, has the electric, gas, oil, or water company threatened to shut off services in your home? (ref #6): No How often does anyone, including family and friends, physically hurt you? (ref #7): Never (1) How often does anyone, including family and friends, insult or talk down to you? (ref #8): Never (1) How often does anyone, including friends and family, threaten you with harm? (ref #9): Never (1) How often does anyone, including family and friends, scream or curse at you? (ref #10): Never (1)  How to Take Your Blood Pressure Blood pressure measures how strongly your blood is pressing against the walls of your arteries. Arteries are blood vessels that carry blood from your heart throughout your body. You can take your blood pressure at home with a machine. You may need to check your blood pressure at home: To check if you have high blood pressure (hypertension). To check your blood pressure over time. To make sure your blood pressure medicine is working. Supplies needed: Blood pressure machine, or monitor. Dining room chair to sit in. Table or desk. Small notebook. Pencil or pen. How to prepare Avoid these things for 30 minutes before checking your blood pressure: Having drinks with caffeine in them, such as coffee or tea. Drinking alcohol. Eating. Smoking. Exercising. Do these things five minutes  before checking your blood pressure: Go to the bathroom and pee (urinate). Sit in a dining chair. Do not sit on a soft couch or an armchair. Be quiet. Do not talk. How to take your blood pressure Follow the instructions that came with your machine. If you have a digital blood pressure monitor, these may be the instructions: Sit up straight. Place your feet on the floor. Do not cross your ankles or legs. Rest your left arm at the level of your heart. You may rest it on a table, desk, or chair. Pull up your shirt sleeve. Wrap the blood pressure cuff around the upper part of your left arm. The cuff should be 1 inch (2.5 cm) above your elbow. It is best to wrap the cuff around bare skin. Fit the cuff snugly around your arm. You should be able to place only one finger between the cuff and your arm. Place the cord so that it rests in the bend of your elbow. Press the power button. Sit quietly while the cuff fills with air and loses air. Write down the numbers on the screen. Wait 2-3 minutes and then repeat steps 1-10. What do the numbers mean? Two numbers make up your blood pressure. The first number is called systolic pressure. The second is called diastolic pressure. An example of a blood pressure reading is "120 over 80" (or 120/80). If you are an adult and do not have a medical condition, use this guide to find out if your blood pressure is normal: Normal First number: below 120. Second number: below 80. Elevated First number: 120-129. Second number: below 80.  Hypertension stage 1 First number: 130-139. Second number: 80-89. Hypertension stage 2 First number: 140 or above. Second number: 9 or above. Your blood pressure is above normal even if only the first or only the second number is above normal. Follow these instructions at home: Medicines Take over-the-counter and prescription medicines only as told by your doctor. Tell your doctor if your medicine is causing side  effects. General instructions Check your blood pressure as often as your doctor tells you to. Check your blood pressure at the same time every day. Take your monitor to your next doctor's appointment. Your doctor will: Make sure you are using it correctly. Make sure it is working right. Understand what your blood pressure numbers should be. Keep all follow-up visits as told by your doctor. This is important. General tips You will need a blood pressure machine, or monitor. Your doctor can suggest a monitor. You can buy one at a drugstore or online. When choosing one: Choose one with an arm cuff. Choose one that wraps around your upper arm. Only one finger should fit between your arm and the cuff. Do not choose one that measures your blood pressure from your wrist or finger. Where to find more information American Heart Association: www.heart.org Contact a doctor if: Your blood pressure keeps being high. Your blood pressure is suddenly low. Get help right away if: Your first blood pressure number is higher than 180. Your second blood pressure number is higher than 120. Summary Check your blood pressure at the same time every day. Avoid caffeine, alcohol, smoking, and exercise for 30 minutes before checking your blood pressure. Make sure you understand what your blood pressure numbers should be. This information is not intended to replace advice given to you by your health care provider. Make sure you discuss any questions you have with your health care provider.       HC Chart Review: 16 min 11/08/21 CP Chart Review: 16 min 11/02/21 CP Office Visit: 32 min 11/10/21 Office Visit Documentation CP: 33 min 11/10/21  Alena Bills Clinical Pharmacist [phone]

## 2021-11-10 NOTE — Telephone Encounter (Signed)
Lvm for son to return my call to schedule passed due awv-Toni

## 2021-11-12 ENCOUNTER — Other Ambulatory Visit: Payer: Self-pay

## 2021-11-12 MED ORDER — DICLOFENAC SODIUM 1 % EX GEL: 2.0000 g | Freq: Four times a day (QID) | CUTANEOUS | 3 refills | Status: DC

## 2021-11-16 ENCOUNTER — Telehealth: Payer: Self-pay | Admitting: Student-PharmD

## 2021-11-16 NOTE — Chronic Care Management (AMB) (Unsigned)
° °  Chronic Care Management Pharmacy Note  11/16/2021 Name:  Stacy [name] MRN:  [id number] DOB:  22-Jun-1927     [place]  PTM/ Health Concierge [phone]

## 2021-11-17 ENCOUNTER — Telehealth: Payer: Self-pay

## 2021-11-17 NOTE — Telephone Encounter (Signed)
Spoke to pt's son about the Meloxicam rx and he advised that pt is not taking the medication at this time.  Pt's son also advised that she isn't taking the tramadol bc she is scared due to her having vision issues.  I informed son that he can make a sooner appt to discuss this with provider if pt can't wait til 12/23/21 appt

## 2021-11-23 ENCOUNTER — Telehealth: Payer: Self-pay | Admitting: Student-PharmD

## 2021-11-23 DIAGNOSIS — I10 Essential (primary) hypertension: Secondary | ICD-10-CM

## 2021-11-23 DIAGNOSIS — J449 Chronic obstructive pulmonary disease, unspecified: Secondary | ICD-10-CM

## 2021-11-23 DIAGNOSIS — I509 Heart failure, unspecified: Secondary | ICD-10-CM

## 2021-11-23 NOTE — Progress Notes (Signed)
°  Chronic Care Management Pharmacy Assistant   Name: Stacy [name]  MRN: [id number] DOB: 1927-08-10   Reason for Encounter: Care Plan and Patient Handout  Medications: Outpatient Encounter Medications as of 11/23/2021  Medication Sig   acetaminophen (TYLENOL) 325 MG tablet Take 650 mg by mouth every 6 (six) hours as needed for pain.   acidophilus (RISAQUAD) CAPS capsule Take 2 capsules by mouth daily.   diclofenac Sodium (VOLTAREN) 1 % GEL Apply 2 g topically 4 (four) times daily.   gabapentin (NEURONTIN) 100 MG capsule Take 2 tabs 3 x day for neuropathy by mouth   hydroxychloroquine (PLAQUENIL) 200 MG tablet Take 1 tablet by mouth twice a day   ipratropium-albuterol (DUONEB) 0.5-2.5 (3) MG/3ML SOLN One vial 2 x a a day prn for cough and wheezing or sob   Multiple Vitamin (MULTIVITAMIN WITH MINERALS) TABS tablet Take 1 tablet by mouth daily.   pantoprazole (PROTONIX) 40 MG tablet Take 1 tablet (40 mg total) by mouth 2 (two) times daily.   traMADol (ULTRAM) 50 MG tablet Take one tab by mouth with supper   No facility-administered encounter medications on file as of 11/23/2021.    Reviewed the patients visit reinsured it was completed per the pharmacist Alena Bills request. Printed the CCM care plan. Mailed the patient CCM care plan and patient handout to their most recent address on file.  Tine: 3 min  Charlann Lange, [place]  [phone]

## 2021-12-10 ENCOUNTER — Other Ambulatory Visit: Payer: Self-pay

## 2021-12-23 ENCOUNTER — Telehealth: Payer: Medicare Other | Admitting: Physician Assistant

## 2021-12-23 VITALS — Resp 16

## 2021-12-23 NOTE — Progress Notes (Signed)
No show for appointment. Office will call to reschedule.  

## 2021-12-29 ENCOUNTER — Ambulatory Visit: Payer: Medicare Other | Admitting: Nurse Practitioner

## 2022-01-20 ENCOUNTER — Encounter: Payer: Self-pay | Admitting: Physician Assistant

## 2022-01-20 ENCOUNTER — Telehealth (INDEPENDENT_AMBULATORY_CARE_PROVIDER_SITE_OTHER): Payer: Medicare Other | Admitting: Physician Assistant

## 2022-01-20 VITALS — Resp 16 | Ht 64.0 in

## 2022-01-20 DIAGNOSIS — Z0001 Encounter for general adult medical examination with abnormal findings: Secondary | ICD-10-CM

## 2022-01-20 DIAGNOSIS — J4520 Mild intermittent asthma, uncomplicated: Secondary | ICD-10-CM | POA: Diagnosis not present

## 2022-01-20 DIAGNOSIS — M353 Polymyalgia rheumatica: Secondary | ICD-10-CM

## 2022-01-20 DIAGNOSIS — K219 Gastro-esophageal reflux disease without esophagitis: Secondary | ICD-10-CM

## 2022-01-20 NOTE — Progress Notes (Signed)
Stacy [name] ?[address] ?[place], [place] [address] ? ?Internal MEDICINE  ?Telephone Visit ? ?Patient Name: Stacy [name] ? [id number]  ?[id number] ? ?Date of Service: 01/26/2022 ? ?I connected with the patient at 2:05 by telephone and verified the patients identity using two identifiers.   ?I discussed the limitations, risks, security and privacy concerns of performing an evaluation and management service by telephone and the availability of in person appointments. I also discussed with the patient that there may be a patient responsible charge related to the service.  The patient expressed understanding and agrees to proceed.   ? ?Chief Complaint  ?Patient presents with  ? Telephone Screen  ? Telephone Assessment  ? Quality Metric Gaps  ?  Pneumonia Vaccine   ? Medicare Wellness  ? ? ?HPI ?Pt is here for virtual annual wellness visit secondary to being immobile and unable to attend visit in person. Her son is also present for visit. ?-No questions or concerns today ?-legs are weak and this makes it difficult for her move. Has a wheelchair but unable to really use this in home so reports she prefers to use office chair and cane as needed  ?-sleeping ok and eating well ?-No medication changes recently. Using tylenol arthritis as needed ?-breathing stable, takes an allergy pill sometimes ?-wonders whether someone could come give her a PNA vaccine at home rather than go out to [employer], but discussed that his is usually not possible especially since she does not have home health nurse ?-not seeing any other doctors right now ? ?Current Medication: ?Outpatient Encounter Medications as of 01/20/2022  ?Medication Sig  ? acetaminophen (TYLENOL) 325 MG tablet Take 650 mg by mouth every 6 (six) hours as needed for pain.  ? acidophilus (RISAQUAD) CAPS capsule Take 2 capsules by mouth daily.  ? diclofenac Sodium (VOLTAREN) 1 % GEL Apply 2 g topically 4 (four) times daily.  ? gabapentin (NEURONTIN) 100 MG capsule Take 2  tabs 3 x day for neuropathy by mouth  ? hydroxychloroquine (PLAQUENIL) 200 MG tablet Take 1 tablet by mouth twice a day  ? ipratropium-albuterol (DUONEB) 0.5-2.5 (3) MG/3ML SOLN One vial 2 x a a day prn for cough and wheezing or sob  ? Multiple Vitamin (MULTIVITAMIN WITH MINERALS) TABS tablet Take 1 tablet by mouth daily.  ? pantoprazole (PROTONIX) 40 MG tablet Take 1 tablet (40 mg total) by mouth 2 (two) times daily.  ? traMADol (ULTRAM) 50 MG tablet Take one tab by mouth with supper  ? ?No facility-administered encounter medications on file as of 01/20/2022.  ? ? ?Surgical History: ?Past Surgical History:  ?Procedure Laterality Date  ? BREAST BIOPSY Left 2006  ? +  ? BREAST SURGERY Left 2006  ? lumpectomy  ? CHOLECYSTECTOMY  1969  ? COLON SURGERY  2004  ? COLONOSCOPY  2014  ? Dr. Candace Cruise  ? EYE SURGERY  2002  ? FOOT SURGERY  1996  ? HERNIA REPAIR  2013  ? left breast biopsy   2011  ? ? ?Medical History: ?Past Medical History:  ?Diagnosis Date  ? Arthritis 1940  ? Asthma   ? Bowel trouble   ? Cancer [facility]) 2006  ? DCIS left breast  ? Hypertension 1940  ? Incisional hernia 2014  ? Personal history of radiation therapy   ? Polymyalgia ([place])   ? Thyroid disorder   ? Vitiligo 2012  ? ? ?Family History: ?Family History  ?Problem Relation Age of Onset  ? Heart disease  Mother   ? Emphysema Mother   ? Arthritis Father   ? Cancer Father   ? Breast cancer Neg Hx   ? ? ?Social History  ? ?Socioeconomic History  ? Marital status: Married  ?  Spouse name: Not on file  ? Number of children: Not on file  ? Years of education: Not on file  ? Highest education level: Not on file  ?Occupational History  ? Not on file  ?Tobacco Use  ? Smoking status: Never  ? Smokeless tobacco: Never  ?Vaping Use  ? Vaping Use: Never used  ?Substance and Sexual Activity  ? Alcohol use: No  ? Drug use: No  ? Sexual activity: Not on file  ?Other Topics Concern  ? Not on file  ?Social History Narrative  ? Not on file  ? ?Social Determinants of Health   ? ?Financial Resource Strain: Low Risk   ? Difficulty of Paying Living Expenses: Not very hard  ?Food Insecurity: Not on file  ?Transportation Needs: Not on file  ?Physical Activity: Not on file  ?Stress: Not on file  ?Social Connections: Not on file  ?Intimate Partner Violence: Not on file  ? ? ? ? ?Review of Systems  ?Constitutional:  Negative for chills, fatigue and unexpected weight change.  ?HENT:  Positive for postnasal drip. Negative for congestion, rhinorrhea, sneezing and sore throat.   ?Eyes:  Negative for redness.  ?Respiratory:  Negative for cough, chest tightness and shortness of breath.   ?Cardiovascular:  Negative for chest pain and palpitations.  ?Gastrointestinal:  Negative for abdominal pain, constipation, diarrhea, nausea and vomiting.  ?Genitourinary:  Negative for dysuria and frequency.  ?Musculoskeletal:  Positive for arthralgias and gait problem. Negative for back pain, joint swelling and neck pain.  ?Skin:  Negative for rash.  ?Neurological:  Positive for weakness. Negative for tremors and numbness.  ?Hematological:  Negative for adenopathy. Does not bruise/bleed easily.  ?Psychiatric/Behavioral:  Negative for behavioral problems (Depression), sleep disturbance and suicidal ideas. The patient is not nervous/anxious.   ? ?Vital Signs: ?Resp 16   Ht '5\' 4"'$  (1.626 m)   BMI 26.61 kg/m?  ? ? ?Observation/Objective: ? ?Pt is able to carry out conversation. ? ? ?Assessment/Plan: ?1. Encounter for general adult medical examination with abnormal findings ?CPE performed virtually, UTD on [facility] except for PNA vaccine, but reports she cannot go out to receive this ? ?2. Polymyalgia rheumatica ([place]) ?Stable, continue current medications ? ?3. Mild intermittent asthma without complication ?Stable, continue neb as needed ? ?4. Chronic GERD ?Continue protonix  ? ? ?General Counseling: Stacy [name] understanding of the findings of today's phone visit and agrees with plan of treatment. I have discussed any  further diagnostic evaluation that may be needed or ordered today. We also reviewed her medications today. she has been encouraged to call the office with any questions or concerns that should arise related to todays visit. ? ? ? ?No orders of the defined types were placed in this encounter. ? ? ?No orders of the defined types were placed in this encounter. ? ? ?Time spent:30 Minutes ? ? ? ?Dr [name] ?Internal medicine  ?

## 2022-02-25 ENCOUNTER — Telehealth: Payer: Self-pay

## 2022-02-25 NOTE — Telephone Encounter (Signed)
Order signed by Dr.Khan and faxed back to [facility] at [phone] and placed in the home health folder at the front desk.

## 2022-03-05 ENCOUNTER — Other Ambulatory Visit: Payer: Self-pay

## 2022-03-05 MED ORDER — HYDROXYCHLOROQUINE SULFATE 200 MG PO TABS: ORAL_TABLET | ORAL | 1 refills | Status: DC

## 2022-03-07 ENCOUNTER — Other Ambulatory Visit: Payer: Self-pay

## 2022-03-07 MED ORDER — HYDROXYCHLOROQUINE SULFATE 200 MG PO TABS: ORAL_TABLET | ORAL | 1 refills | Status: DC

## 2022-03-18 ENCOUNTER — Telehealth: Payer: Self-pay

## 2022-03-21 ENCOUNTER — Telehealth: Payer: Self-pay

## 2022-03-21 ENCOUNTER — Other Ambulatory Visit: Payer: Self-pay

## 2022-03-21 MED ORDER — TRAMADOL HCL 50 MG PO TABS: ORAL_TABLET | ORAL | 0 refills | Status: DC

## 2022-03-21 NOTE — Telephone Encounter (Signed)
Error

## 2022-03-21 NOTE — Telephone Encounter (Signed)
[facility]ed in [facility] tramadol 50 mg 1 tab po daily #30 with no refills as per [facility]

## 2022-04-12 ENCOUNTER — Telehealth: Payer: Self-pay

## 2022-04-12 NOTE — Telephone Encounter (Signed)
Lvm w/ daughter, Ginger, to return call to schedule virtual appointment-Toni

## 2022-04-12 NOTE — Telephone Encounter (Signed)
Called [facility] and s/w [facility] pharmacist and she advised that TRAMADOL was picked up on 03/24/22 for 30 tabs

## 2022-04-18 ENCOUNTER — Telehealth (INDEPENDENT_AMBULATORY_CARE_PROVIDER_SITE_OTHER): Payer: Medicare Other | Admitting: Physician Assistant

## 2022-04-18 ENCOUNTER — Telehealth: Payer: Self-pay

## 2022-04-18 VITALS — Resp 16 | Ht 64.0 in

## 2022-04-18 DIAGNOSIS — M150 Primary generalized (osteo)arthritis: Secondary | ICD-10-CM | POA: Diagnosis not present

## 2022-04-18 DIAGNOSIS — M353 Polymyalgia rheumatica: Secondary | ICD-10-CM

## 2022-04-18 MED ORDER — TRAMADOL HCL 50 MG PO TABS: ORAL_TABLET | ORAL | 0 refills | Status: DC

## 2022-04-18 NOTE — Telephone Encounter (Signed)
Called [facility] and canceled tramadol refills and send to [facility] as per daughter

## 2022-04-18 NOTE — Progress Notes (Signed)
[facility] [place], [place] [address]  Internal MEDICINE  Telephone Visit  Patient Name: Stacy [name]  [id number]  [id number]  Date of Service: 04/24/2022  I connected with the patient at 3:49 by telephone and verified the patients identity using two identifiers.   I discussed the limitations, risks, security and privacy concerns of performing an evaluation and management service by telephone and the availability of in person appointments. I also discussed with the patient that there may be a patient responsible charge related to the service.  The patient expressed understanding and agrees to proceed.    Chief Complaint  Patient presents with   Telephone Assessment    [phone] phone call   Telephone Screen   Medication Refill    HPI Pt is here for virtual follow up for med refills due to not being able to get around and come into office -[name] is also on the call today, reports her other son is currently in rehab for his foot -She is in need of her tramadol refill which she takes once per day and does request a 90 day supply -Everything has been the same recently -reports she is doing well aside from continued inability to walk which limits getting around -No concerns today -Eating regularly -Sleeping well -Has not had a visit with rheumatology in a long time  Current Medication: Outpatient Encounter Medications as of 04/18/2022  Medication Sig   acetaminophen (TYLENOL) 325 MG tablet Take 650 mg by mouth every 6 (six) hours as needed for pain.   diclofenac Sodium (VOLTAREN) 1 % GEL Apply 2 g topically 4 (four) times daily.   gabapentin (NEURONTIN) 100 MG capsule Take 2 tabs 3 x day for neuropathy by mouth   hydroxychloroquine (PLAQUENIL) 200 MG tablet Take 1 tablet by mouth twice a day (Patient taking differently: Take 1 tablet by mouth twice a day    [facility])   lidocaine 4 % Place 1 patch onto the skin daily. As needed for pain    Multiple Vitamin (MULTIVITAMIN WITH MINERALS) TABS tablet Take 1 tablet by mouth daily.   pantoprazole (PROTONIX) 40 MG tablet Take 1 tablet (40 mg total) by mouth 2 (two) times daily.   [DISCONTINUED] traMADol (ULTRAM) 50 MG tablet Take one tab by mouth with supper (Patient taking differently: Take one tab by mouth with supper   [facility])   acidophilus (RISAQUAD) CAPS capsule Take 2 capsules by mouth daily.   traMADol (ULTRAM) 50 MG tablet Take one tab by mouth with supper   [DISCONTINUED] ipratropium-albuterol (DUONEB) 0.5-2.5 (3) MG/3ML SOLN One vial 2 x a a day prn for cough and wheezing or sob (Patient not taking: Reported on 04/18/2022)   [DISCONTINUED] traMADol (ULTRAM) 50 MG tablet Take one tab by mouth with supper   No facility-administered encounter medications on file as of 04/18/2022.    Surgical History: Past Surgical History:  Procedure Laterality Date   BREAST BIOPSY Left 2006   +   BREAST SURGERY Left 2006   lumpectomy   [place]   COLON SURGERY  2004   COLONOSCOPY  2014   Dr[facility]   EYE SURGERY  2002   FOOT SURGERY  1996   HERNIA REPAIR  2013   left breast biopsy   2011    Medical History: Past Medical History:  Diagnosis Date   Arthritis 1940   Asthma    Bowel trouble    Cancer ([place])  2006   DCIS left breast   Hypertension 1940   Incisional hernia 2014   Personal history of radiation therapy    Polymyalgia ([place])    Thyroid disorder    Vitiligo 2012    Family History: Family History  Problem Relation Age of Onset   Heart disease Mother    Emphysema Mother    Arthritis Father    Cancer Father    Breast cancer Neg Hx     Social History   Socioeconomic History   Marital status: Married    Spouse name: Not on file   Number of children: Not on file   Years of education: Not on file   Highest education level: Not on file  Occupational History   Not on file  Tobacco Use   Smoking status: Never   Smokeless tobacco:  Never  Vaping Use   Vaping Use: Never used  Substance and Sexual Activity   Alcohol use: No   Drug use: No   Sexual activity: Not on file  Other Topics Concern   Not on file  Social History Narrative   Not on file   Social Determinants of Health   Financial Resource Strain: Low Risk  (04/07/2021)   Overall Financial Resource Strain (CARDIA)    Difficulty of Paying Living Expenses: Not very hard  Food Insecurity: Not on file  Transportation Needs: Not on file  Physical Activity: Not on file  Stress: Not on file  Social Connections: Not on file  Intimate Partner Violence: Not on file      Review of Systems  Constitutional:  Negative for chills, fatigue and unexpected weight change.  HENT:  Negative for congestion, postnasal drip, rhinorrhea, sneezing and sore throat.   Eyes:  Negative for redness.  Respiratory:  Negative for cough, chest tightness and shortness of breath.   Cardiovascular:  Negative for chest pain and palpitations.  Gastrointestinal:  Negative for abdominal pain, constipation, diarrhea, nausea and vomiting.  Genitourinary:  Negative for dysuria and frequency.  Musculoskeletal:  Positive for arthralgias and gait problem. Negative for back pain, joint swelling and neck pain.  Skin:  Negative for rash.  Neurological:  Positive for weakness. Negative for tremors and numbness.  Hematological:  Negative for adenopathy. Does not bruise/bleed easily.  Psychiatric/Behavioral:  Negative for behavioral problems (Depression), sleep disturbance and suicidal ideas. The patient is not nervous/anxious.     Vital Signs: Resp 16   Ht '5\' 4"'$  (1.626 m)   BMI 26.61 kg/m    Observation/Objective:  Pt is able to carry out conversation   Assessment/Plan: 1. Polymyalgia rheumatica ([facility]) Stable, may continue current medications - traMADol (ULTRAM) 50 MG tablet; Take one tab by mouth with supper  Dispense: 90 tablet; Refill: 0  2. Primary generalized (osteo)arthritis May  continue tramadol as neded - traMADol (ULTRAM) 50 MG tablet; Take one tab by mouth with supper  Dispense: 90 tablet; Refill: 0   General Counseling: Stacy [name] verbalizes understanding of the findings of today's phone visit and agrees with plan of treatment. I have discussed any further diagnostic evaluation that may be needed or ordered today. We also reviewed her medications today. she has been encouraged to call the office with any questions or concerns that should arise related to todays visit.    No orders of the defined types were placed in this encounter.   Meds ordered this encounter  Medications   DISCONTD: traMADol (ULTRAM) 50 MG tablet    Sig: Take one tab by mouth  with supper    Dispense:  90 tablet    Refill:  0   traMADol (ULTRAM) 50 MG tablet    Sig: Take one tab by mouth with supper    Dispense:  90 tablet    Refill:  0    Time spent:30 Minutes    Dr [name] Internal medicine

## 2022-05-04 ENCOUNTER — Encounter: Payer: Self-pay | Admitting: Nurse Practitioner

## 2022-05-16 ENCOUNTER — Other Ambulatory Visit: Payer: Self-pay | Admitting: Internal Medicine

## 2022-05-16 ENCOUNTER — Encounter: Payer: Self-pay | Admitting: Physician Assistant

## 2022-05-16 MED ORDER — PREDNISONE 10 MG PO TABS: 10.0000 mg | ORAL_TABLET | Freq: Every day | ORAL | 1 refills | Status: DC

## 2022-05-26 ENCOUNTER — Other Ambulatory Visit: Payer: Self-pay | Admitting: Physician Assistant

## 2022-05-27 ENCOUNTER — Emergency Department: Payer: Medicare Other

## 2022-05-27 ENCOUNTER — Other Ambulatory Visit: Payer: Self-pay

## 2022-05-27 DIAGNOSIS — I7143 Infrarenal abdominal aortic aneurysm, without rupture: Secondary | ICD-10-CM | POA: Insufficient documentation

## 2022-05-27 DIAGNOSIS — J449 Chronic obstructive pulmonary disease, unspecified: Secondary | ICD-10-CM | POA: Insufficient documentation

## 2022-05-27 DIAGNOSIS — I129 Hypertensive chronic kidney disease with stage 1 through stage 4 chronic kidney disease, or unspecified chronic kidney disease: Secondary | ICD-10-CM | POA: Insufficient documentation

## 2022-05-27 DIAGNOSIS — S0003XA Contusion of scalp, initial encounter: Secondary | ICD-10-CM | POA: Diagnosis not present

## 2022-05-27 DIAGNOSIS — Y93I1 Activity, roller coaster riding: Secondary | ICD-10-CM | POA: Insufficient documentation

## 2022-05-27 DIAGNOSIS — S0990XA Unspecified injury of head, initial encounter: Secondary | ICD-10-CM | POA: Diagnosis not present

## 2022-05-27 DIAGNOSIS — Z853 Personal history of malignant neoplasm of breast: Secondary | ICD-10-CM | POA: Diagnosis not present

## 2022-05-27 DIAGNOSIS — R519 Headache, unspecified: Secondary | ICD-10-CM | POA: Diagnosis not present

## 2022-05-27 DIAGNOSIS — J45909 Unspecified asthma, uncomplicated: Secondary | ICD-10-CM | POA: Insufficient documentation

## 2022-05-27 DIAGNOSIS — N189 Chronic kidney disease, unspecified: Secondary | ICD-10-CM | POA: Diagnosis not present

## 2022-05-27 DIAGNOSIS — E039 Hypothyroidism, unspecified: Secondary | ICD-10-CM | POA: Insufficient documentation

## 2022-05-27 DIAGNOSIS — M546 Pain in thoracic spine: Secondary | ICD-10-CM | POA: Diagnosis not present

## 2022-05-27 DIAGNOSIS — R52 Pain, unspecified: Secondary | ICD-10-CM | POA: Diagnosis not present

## 2022-05-27 DIAGNOSIS — M545 Low back pain, unspecified: Secondary | ICD-10-CM | POA: Diagnosis not present

## 2022-05-27 DIAGNOSIS — W07XXXA Fall from chair, initial encounter: Secondary | ICD-10-CM | POA: Insufficient documentation

## 2022-05-27 DIAGNOSIS — W19XXXA Unspecified fall, initial encounter: Secondary | ICD-10-CM | POA: Diagnosis not present

## 2022-05-27 DIAGNOSIS — M542 Cervicalgia: Secondary | ICD-10-CM | POA: Diagnosis not present

## 2022-05-27 DIAGNOSIS — S298XXA Other specified injuries of thorax, initial encounter: Secondary | ICD-10-CM | POA: Diagnosis not present

## 2022-05-27 NOTE — ED Provider Triage Note (Signed)
Emergency Medicine Provider Triage Evaluation Note  Stacy [name] , a 88 y.o. female  was evaluated in triage.  Pt complains of headache and low back pain after patient fell from a chair.  Patient is not currently anticoagulated.  She is also complaining of some mild upper back pain.  No chest pain, chest tightness or abdominal pain.  Review of Systems  Positive: Patient has headache, neck pain and back pain. Negative: No chest pain or abdominal pain.  Physical Exam  BP (!) 120/92 (BP Location: Right Arm)   Pulse 74   Temp 98.3 F (36.8 C) (Oral)   Resp 16   SpO2 100%  Gen:   Awake, no distress   Resp:  Normal effort  MSK:   Moves extremities without difficulty  Other:    Medical Decision Making  Medically screening exam initiated at 10:31 PM.  Appropriate orders placed.  Stacy [name] was informed that the remainder of the evaluation will be completed by another provider, this initial triage assessment does not replace that evaluation, and the importance of remaining in the ED until their evaluation is complete.     Vallarie Mare [place], [place] 05/27/22 2231

## 2022-05-27 NOTE — ED Triage Notes (Signed)
Pt states she fell backwards in a chair hitting head on wall. Pt with hematoma to posterior scalp, pt denies neck pain, headache or other injury. Pt denies LOC.

## 2022-05-28 ENCOUNTER — Emergency Department
Admission: EM | Admit: 2022-05-28 | Discharge: 2022-05-28 | Disposition: A | Discharge status: Home / Self Care | Payer: Medicare Other | Attending: Emergency Medicine | Admitting: Emergency Medicine

## 2022-05-28 DIAGNOSIS — W19XXXA Unspecified fall, initial encounter: Secondary | ICD-10-CM

## 2022-05-28 DIAGNOSIS — M549 Dorsalgia, unspecified: Secondary | ICD-10-CM

## 2022-05-28 DIAGNOSIS — S0990XA Unspecified injury of head, initial encounter: Secondary | ICD-10-CM

## 2022-05-28 DIAGNOSIS — I7143 Infrarenal abdominal aortic aneurysm, without rupture: Secondary | ICD-10-CM

## 2022-05-28 MED ORDER — HYDROCODONE-ACETAMINOPHEN 5-325 MG PO TABS
0.5000 | ORAL_TABLET | Freq: Once | ORAL | Status: AC
  Administered 2022-05-28: 0.5 via ORAL
  Filled 2022-05-28: qty 1

## 2022-05-28 MED ORDER — HYDROCODONE-ACETAMINOPHEN 5-325 MG PO TABS
1.0000 | ORAL_TABLET | Freq: Four times a day (QID) | ORAL | 0 refills | Status: DC | PRN
Start: 2022-05-28 — End: 2022-06-02

## 2022-05-28 NOTE — ED Provider Notes (Signed)
[facility] Provider Note    Event Date/Time   First MD Initiated Contact with Patient 05/28/22 [phone]     (approximate)   History   Head Injury   HPI  Stacy [name] is a 88 y.o. female who presents to the ED from home status post mechanical fall.  Patient fell backwards in a rolling chair, striking her head on the wall.  Denies LOC.  Denies anticoagulant use.  Complains of entire spine pain.  Denies extremity weakness/numbness/tingling.  Denies chest pain, shortness of breath, abdominal pain, nausea, vomiting or dizziness.     Past Medical History   Past Medical History:  Diagnosis Date  . Arthritis 1940  . Asthma   . Bowel trouble   . Cancer [facility]) 2006   DCIS left breast  . Hypertension 1940  . Incisional hernia 2014  . Personal history of radiation therapy   . Polymyalgia ([place])   . Thyroid disorder   . Vitiligo 2012     Active Problem List   Patient Active Problem List   Diagnosis Date Noted  . Malnutrition of moderate degree 03/13/2021  . Hypomagnesemia 03/08/2021  . AKI (acute kidney injury) ([place]) 03/08/2021  . Chronic respiratory failure with hypoxia ([place]) 03/07/2021  . Acute blood loss anemia 03/07/2021  . Leukocytosis 03/07/2021  . Acute colitis on CT 03/07/2021  . Encounter for general adult medical examination with abnormal findings 07/05/2018  . Neoplasm of uncertain behavior of skin of hand 07/05/2018  . Simple chronic bronchitis ([place]) 12/31/2017  . Congestive heart failure ([place]) 10/20/2017  . Primary generalized (osteo)arthritis 09/21/2017  . Vitamin B12 deficiency anemia, unspecified 09/21/2017  . Chronic kidney disease, unspecified 09/21/2017  . Vitamin D deficiency, unspecified 09/21/2017  . COPD (chronic obstructive pulmonary disease) ([place]) 09/21/2017  . Unspecified hearing loss, bilateral 09/21/2017  . Age-related osteoporosis without current pathological fracture 09/21/2017  . Other amnesia 09/21/2017  . Cough  09/21/2017  . Allergic rhinitis, unspecified 09/21/2017  . Other seborrheic dermatitis 09/21/2017  . Unspecified cervical disc disorder, mid-cervical region, unspecified level 09/21/2017  . Neuromuscular dysfunction of bladder, unspecified 09/21/2017  . Allergic rhinitis due to pollen 09/21/2017  . Other primary ovarian failure 09/21/2017  . Shortness of breath 09/21/2017  . Acute bronchitis, unspecified 09/21/2017  . Benign and innocent cardiac murmurs 09/21/2017  . Dysuria 09/21/2017  . Retention of urine, unspecified 09/21/2017  . Unspecified urinary incontinence 09/21/2017  . Mixed incontinence 09/21/2017  . Insomnia, unspecified 09/21/2017  . Presyncope secondary to symptomatic anemia 09/21/2017  . Polymyalgia rheumatica ([place]) 09/21/2017  . Low back pain 09/21/2017  . Hypoxemia 09/21/2017  . Spondylosis without myelopathy or radiculopathy, lumbosacral region 09/21/2017  . Pain in unspecified knee 09/21/2017  . Mixed hyperlipidemia 09/21/2017  . Cardiomegaly 09/21/2017  . Abnormality of gait and mobility 09/21/2017  . Personal history of malignant neoplasm of breast 04/15/2013  . History of breast cancer 12/24/2012  . Incisional hernia, without obstruction or gangrene 12/24/2012  . Essential hypertension   . Vitiligo   . Arthritis   . Hypothyroidism   . Bowel trouble      Past Surgical History   Past Surgical History:  Procedure Laterality Date  . BREAST BIOPSY Left 2006   +  . BREAST SURGERY Left 2006   lumpectomy  . CHOLECYSTECTOMY  1969  . COLON SURGERY  2004  . COLONOSCOPY  2014   Dr. Candace Cruise  . EYE SURGERY  2002  . FOOT SURGERY  1996  .  HERNIA REPAIR  2013  . left breast biopsy   2011     Home Medications   Prior to Admission medications   Medication Sig Start Date End Date Taking? Authorizing Provider  acetaminophen (TYLENOL) 325 MG tablet Take 650 mg by mouth every 6 (six) hours as needed for pain.    [provider]  acidophilus (RISAQUAD)  CAPS capsule Take 2 capsules by mouth daily. 11/10/21   [name], MD  diclofenac Sodium (VOLTAREN) 1 % GEL Apply 2 g topically 4 (four) times daily. 11/12/21   [name], MD  gabapentin (NEURONTIN) 100 MG capsule Take 2 tabs 3 x day for neuropathy by mouth 11/10/21   [name], MD  hydroxychloroquine (PLAQUENIL) 200 MG tablet Take 1 tablet by mouth twice a day    send to [facility] 05/26/22   McDonough, Lauren K, PA-C  lidocaine 4 % Place 1 patch onto the skin daily. As needed for pain    [provider]  Multiple Vitamin (MULTIVITAMIN WITH MINERALS) TABS tablet Take 1 tablet by mouth daily. 03/17/21   Geradine Girt, DO  pantoprazole (PROTONIX) 40 MG tablet Take 1 tablet (40 mg total) by mouth 2 (two) times daily. 11/10/21   [name], MD  predniSONE (DELTASONE) 10 MG tablet Take 1 tablet (10 mg total) by mouth daily with breakfast. 05/16/22   [name], MD  traMADol Veatrice Bourbon) 50 MG tablet Take one tab by mouth with supper 04/18/22   McDonough, Si Gaul, PA-C     Allergies  Aspirin, Augmentin [amoxicillin-pot clavulanate], Codeine, Doxycycline hyclate, Sulfamethoxazole-trimethoprim, Tape, Vitamin d analogs, and Azithromycin   Family History   Family History  Problem Relation Age of Onset  . Heart disease Mother   . Emphysema Mother   . Arthritis Father   . Cancer Father   . Breast cancer Neg Hx      Physical Exam  Triage Vital Signs: ED Triage Vitals  Enc Vitals Group     BP 05/27/22 2228 (!) 120/92     Pulse Rate 05/27/22 2228 74     Resp 05/27/22 2228 16     Temp 05/27/22 2228 98.3 F (36.8 C)     Temp Source 05/27/22 2228 Oral     SpO2 05/27/22 2228 100 %     Weight 05/27/22 2232 140 lb (63.5 kg)     Height 05/27/22 2232 '5\' 4"'$  (1.626 m)     Head Circumference --      Peak Flow --      Pain Score 05/27/22 2231 0     Pain Loc --      Pain Edu? --      Excl. in [place]? --     Updated Vital Signs: BP 112/82 (BP Location: Right Arm)   Pulse 75   Temp  98.2 F (36.8 C) (Oral)   Resp 16   Ht '5\' 4"'$  (1.626 m)   Wt 63.5 kg   SpO2 97%   BMI 24.03 kg/m    General: Awake, no distress.  Appears much younger than stated age. CV:  RRR.  Good peripheral perfusion.  Resp:  Normal effort.  CTA B. Abd:  Nontender.  No distention.  Other:  Head is atraumatic.  Nose is atraumatic.  No dental malocclusion.  No midline tenderness to cervical, thoracic or lumbar spine.  No step-offs or deformities.  Alert and oriented to person and place which is patient's baseline.  CN II toXII grossly intact.  5/5 motor strength and sensation all extremities. MAEx4.    ED Results / Procedures / Treatments  Labs (all labs ordered are listed, but only abnormal results are displayed) Labs Reviewed - No data to display   EKG  None   RADIOLOGY I have independently visualized and interpreted patient's CT scans as well as noted the radiology interpretation:  CT head: No ICH  CT cervical spine: No acute osseous injury  CT thoracic spine: 3.2 cm suprarenal abdominal aortic nonruptured aneurysm noted  CT lumbar spine: No acute fracture  Official radiology report(s): CT Head Wo Contrast  Result Date: 05/27/2022 CLINICAL DATA:  Head trauma, minor (Age >= 65y). Per ed notes: Pt states she fell backwards in a chair hitting head on wall. Pt with hematoma to posterior scalp, pt denies neck pain, headache or other injury. Pt denies LOC. EXAM: CT HEAD WITHOUT CONTRAST TECHNIQUE: Contiguous axial images were obtained from the base of the skull through the vertex without intravenous contrast. RADIATION DOSE REDUCTION: This exam was performed according to the departmental dose-optimization program which includes automated exposure control, adjustment of the mA and/or kV according to patient size and/or use of iterative reconstruction technique. COMPARISON:  CT head 04/01/2010 BRAIN: BRAIN Cerebral ventricle sizes are concordant with the degree of cerebral volume loss. Patchy  and confluent areas of decreased attenuation are noted throughout the deep and periventricular white matter of the cerebral hemispheres bilaterally, compatible with chronic microvascular ischemic disease. No evidence of large-territorial acute infarction. No parenchymal hemorrhage. No mass lesion. No extra-axial collection. No mass effect or midline shift. No hydrocephalus. Basilar cisterns are patent. Vascular: No hyperdense vessel. Atherosclerotic calcifications are present within the cavernous internal carotid arteries. Skull: No acute fracture or focal lesion. Sinuses/Orbits: Paranasal sinuses and mastoid air cells are clear. Bilateral lens replacement. Otherwise the orbits are unremarkable. Other: Small right frontoparietal scalp hematoma formation. IMPRESSION: No acute intracranial abnormality. Electronically Signed   By: Iven Finn M.D.   On: 05/27/2022 23:24   CT Cervical Spine Wo Contrast  Result Date: 05/27/2022 CLINICAL DATA:  Neck trauma (Age >= 65y); Back trauma, no prior imaging (Age >= 16y). Per ed notes: Pt states she fell backwards in a chair hitting head on wall. Pt with hematoma to posterior scalp, pt denies neck pain, headache or other injury. Pt denies LOC. EXAM: CT CERVICAL, THORACIC, AND LUMBAR SPINE WITHOUT CONTRAST TECHNIQUE: Multidetector CT imaging of the cervical, thoracic and lumbar spine was performed without intravenous contrast. Multiplanar CT image reconstructions were also generated. RADIATION DOSE REDUCTION: This exam was performed according to the departmental dose-optimization program which includes automated exposure control, adjustment of the mA and/or kV according to patient size and/or use of iterative reconstruction technique. COMPARISON:  CT abdomen pelvis 03/07/2021, x-ray cervical spine 07/02/2014 FINDINGS: CT CERVICAL SPINE FINDINGS Alignment: Straightening of the normal cervical lordosis likely due to positioning and degenerative changes. Skull base and  vertebrae: Multilevel moderate to severe degenerative changes of the spine with associated multilevel moderate to severe osseous neural foraminal stenosis. No severe osseous central canal stenosis. No acute fracture. No aggressive appearing focal osseous lesion or focal pathologic process. Soft tissues and spinal canal: No prevertebral fluid or swelling. No visible canal hematoma. Upper chest: Unremarkable. Other: None. CT THORACIC SPINE FINDINGS Alignment: Normal. Vertebrae: Diffusely decreased bone density. Mild multilevel osteophyte formation. No acute fracture or focal pathologic process. Paraspinal and other soft tissues: Negative. Disc levels: Maintained. CT LUMBAR SPINE FINDINGS Segmentation: 5 lumbar type vertebrae. Alignment: Normal. Vertebrae:  Diffusely decreased bone density. Mild osteophyte formation and mild facet arthropathy. No acute fracture or focal pathologic process. Paraspinal and other soft tissues: Negative. Disc levels: Moderate to severe intervertebral disc space narrowing at the L5-S1 level. Other: Cardiomegaly. Atherosclerotic plaque. Aneurysmal suprarenal abdominal aorta measuring up to 3.2 x 3 cm (5:29 lumbar spine). IMPRESSION: 1. No acute displaced fracture or traumatic listhesis of the cervical, thoracic, lumbar spine. 2. Multilevel moderate to severe degenerative changes of the cervical spine with associated multilevel cervical spine moderate to severe osseous neural foraminal stenosis. 3. Diffusely decreased bone density. Other imaging findings of potential clinical significance: 1. Cardiomegaly. 2. Aneurysmal suprarenal abdominal aorta (3.2 cm). Recommend follow-up ultrasound every 3 years. This recommendation follows ACR consensus guidelines: White Paper of the ACR Incidental Findings Committee II on Vascular Findings. J Am Coll Radiol 2013; 10:789-794. Aortic aneurysm NOS (ICD10-I71.9). 3.  Aortic Atherosclerosis (ICD10-I70.0). Electronically Signed   By: Iven Finn M.D.    On: 05/27/2022 23:18   CT Thoracic Spine Wo Contrast  Result Date: 05/27/2022 CLINICAL DATA:  Neck trauma (Age >= 65y); Back trauma, no prior imaging (Age >= 16y). Per ed notes: Pt states she fell backwards in a chair hitting head on wall. Pt with hematoma to posterior scalp, pt denies neck pain, headache or other injury. Pt denies LOC. EXAM: CT CERVICAL, THORACIC, AND LUMBAR SPINE WITHOUT CONTRAST TECHNIQUE: Multidetector CT imaging of the cervical, thoracic and lumbar spine was performed without intravenous contrast. Multiplanar CT image reconstructions were also generated. RADIATION DOSE REDUCTION: This exam was performed according to the departmental dose-optimization program which includes automated exposure control, adjustment of the mA and/or kV according to patient size and/or use of iterative reconstruction technique. COMPARISON:  CT abdomen pelvis 03/07/2021, x-ray cervical spine 07/02/2014 FINDINGS: CT CERVICAL SPINE FINDINGS Alignment: Straightening of the normal cervical lordosis likely due to positioning and degenerative changes. Skull base and vertebrae: Multilevel moderate to severe degenerative changes of the spine with associated multilevel moderate to severe osseous neural foraminal stenosis. No severe osseous central canal stenosis. No acute fracture. No aggressive appearing focal osseous lesion or focal pathologic process. Soft tissues and spinal canal: No prevertebral fluid or swelling. No visible canal hematoma. Upper chest: Unremarkable. Other: None. CT THORACIC SPINE FINDINGS Alignment: Normal. Vertebrae: Diffusely decreased bone density. Mild multilevel osteophyte formation. No acute fracture or focal pathologic process. Paraspinal and other soft tissues: Negative. Disc levels: Maintained. CT LUMBAR SPINE FINDINGS Segmentation: 5 lumbar type vertebrae. Alignment: Normal. Vertebrae: Diffusely decreased bone density. Mild osteophyte formation and mild facet arthropathy. No acute fracture  or focal pathologic process. Paraspinal and other soft tissues: Negative. Disc levels: Moderate to severe intervertebral disc space narrowing at the L5-S1 level. Other: Cardiomegaly. Atherosclerotic plaque. Aneurysmal suprarenal abdominal aorta measuring up to 3.2 x 3 cm (5:29 lumbar spine). IMPRESSION: 1. No acute displaced fracture or traumatic listhesis of the cervical, thoracic, lumbar spine. 2. Multilevel moderate to severe degenerative changes of the cervical spine with associated multilevel cervical spine moderate to severe osseous neural foraminal stenosis. 3. Diffusely decreased bone density. Other imaging findings of potential clinical significance: 1. Cardiomegaly. 2. Aneurysmal suprarenal abdominal aorta (3.2 cm). Recommend follow-up ultrasound every 3 years. This recommendation follows ACR consensus guidelines: White Paper of the ACR Incidental Findings Committee II on Vascular Findings. J Am Coll Radiol 2013; 10:789-794. Aortic aneurysm NOS (ICD10-I71.9). 3.  Aortic Atherosclerosis (ICD10-I70.0). Electronically Signed   By: Iven Finn M.D.   On: 05/27/2022 23:18   CT Lumbar Spine Wo  Contrast  Result Date: 05/27/2022 CLINICAL DATA:  Neck trauma (Age >= 65y); Back trauma, no prior imaging (Age >= 16y). Per ed notes: Pt states she fell backwards in a chair hitting head on wall. Pt with hematoma to posterior scalp, pt denies neck pain, headache or other injury. Pt denies LOC. EXAM: CT CERVICAL, THORACIC, AND LUMBAR SPINE WITHOUT CONTRAST TECHNIQUE: Multidetector CT imaging of the cervical, thoracic and lumbar spine was performed without intravenous contrast. Multiplanar CT image reconstructions were also generated. RADIATION DOSE REDUCTION: This exam was performed according to the departmental dose-optimization program which includes automated exposure control, adjustment of the mA and/or kV according to patient size and/or use of iterative reconstruction technique. COMPARISON:  CT abdomen pelvis  03/07/2021, x-ray cervical spine 07/02/2014 FINDINGS: CT CERVICAL SPINE FINDINGS Alignment: Straightening of the normal cervical lordosis likely due to positioning and degenerative changes. Skull base and vertebrae: Multilevel moderate to severe degenerative changes of the spine with associated multilevel moderate to severe osseous neural foraminal stenosis. No severe osseous central canal stenosis. No acute fracture. No aggressive appearing focal osseous lesion or focal pathologic process. Soft tissues and spinal canal: No prevertebral fluid or swelling. No visible canal hematoma. Upper chest: Unremarkable. Other: None. CT THORACIC SPINE FINDINGS Alignment: Normal. Vertebrae: Diffusely decreased bone density. Mild multilevel osteophyte formation. No acute fracture or focal pathologic process. Paraspinal and other soft tissues: Negative. Disc levels: Maintained. CT LUMBAR SPINE FINDINGS Segmentation: 5 lumbar type vertebrae. Alignment: Normal. Vertebrae: Diffusely decreased bone density. Mild osteophyte formation and mild facet arthropathy. No acute fracture or focal pathologic process. Paraspinal and other soft tissues: Negative. Disc levels: Moderate to severe intervertebral disc space narrowing at the L5-S1 level. Other: Cardiomegaly. Atherosclerotic plaque. Aneurysmal suprarenal abdominal aorta measuring up to 3.2 x 3 cm (5:29 lumbar spine). IMPRESSION: 1. No acute displaced fracture or traumatic listhesis of the cervical, thoracic, lumbar spine. 2. Multilevel moderate to severe degenerative changes of the cervical spine with associated multilevel cervical spine moderate to severe osseous neural foraminal stenosis. 3. Diffusely decreased bone density. Other imaging findings of potential clinical significance: 1. Cardiomegaly. 2. Aneurysmal suprarenal abdominal aorta (3.2 cm). Recommend follow-up ultrasound every 3 years. This recommendation follows ACR consensus guidelines: White Paper of the ACR Incidental  Findings Committee II on Vascular Findings. J Am Coll Radiol 2013; 10:789-794. Aortic aneurysm NOS (ICD10-I71.9). 3.  Aortic Atherosclerosis (ICD10-I70.0). Electronically Signed   By: Iven Finn M.D.   On: 05/27/2022 23:18     PROCEDURES:  Critical Care performed: No  Procedures   MEDICATIONS ORDERED IN ED: Medications  HYDROcodone-acetaminophen (NORCO/VICODIN) 5-325 MG per tablet 0.5 tablet (has no administration in time range)     IMPRESSION / MDM / ASSESSMENT AND PLAN / ED COURSE  I reviewed the triage vital signs and the nursing notes.                             88 year old female who presents status post mechanical fall with back pain, not anticoagulated.  Differential diagnosis includes but is not limited to [place], SDH, spinal fracture, dislocation, etc.  I have personally reviewed patient's records and note a video visit with her pulmonologist on 04/18/2022.  Patient's presentation is most consistent with acute complicated illness / injury requiring diagnostic workup.  CT imaging negative for acute traumatic injuries.  Incidental nonruptured infrarenal aortic aneurysm noted.  Will refer to vascular surgery for follow-up.  Administer Norco as needed for pain.  Strict  return precautions given.  Patient and family members verbalized understanding agree with plan of care.      FINAL CLINICAL IMPRESSION(S) / ED DIAGNOSES   Final diagnoses:  Fall, initial encounter  Minor head injury, initial encounter  Acute midline back pain, unspecified back location  Infrarenal abdominal aortic aneurysm (AAA) without rupture ([place])     Rx / DC Orders   ED Discharge Orders     None        Note:  This document was prepared using Dragon voice recognition software and may include unintentional dictation errors.   Paulette Blanch, MD 05/28/22 [phone]

## 2022-05-28 NOTE — ED Notes (Signed)
Pt provided warm compress for comfort while waiting

## 2022-05-28 NOTE — Discharge Instructions (Signed)
You may take Norco 1/2 to 1 tablet every 6 hours as needed for pain. Return to the ER for worsening symptoms, persistent vomiting, difficulty breathing or other concerns.

## 2022-05-28 NOTE — ED Notes (Signed)
Pt Dc to home. Dc instructions reviewed with all questions answered. Pt verbalizes understanding. Pt assisted out of dept via wheelchair with family

## 2022-06-02 ENCOUNTER — Telehealth (INDEPENDENT_AMBULATORY_CARE_PROVIDER_SITE_OTHER): Payer: Medicare Other | Admitting: Nurse Practitioner

## 2022-06-02 ENCOUNTER — Other Ambulatory Visit: Payer: Self-pay

## 2022-06-02 DIAGNOSIS — M150 Primary generalized (osteo)arthritis: Secondary | ICD-10-CM | POA: Diagnosis not present

## 2022-06-02 DIAGNOSIS — Z9181 History of falling: Secondary | ICD-10-CM | POA: Diagnosis not present

## 2022-06-02 DIAGNOSIS — M545 Low back pain, unspecified: Secondary | ICD-10-CM | POA: Diagnosis not present

## 2022-06-02 DIAGNOSIS — I509 Heart failure, unspecified: Secondary | ICD-10-CM

## 2022-06-02 DIAGNOSIS — M353 Polymyalgia rheumatica: Secondary | ICD-10-CM | POA: Diagnosis not present

## 2022-06-02 DIAGNOSIS — J9611 Chronic respiratory failure with hypoxia: Secondary | ICD-10-CM | POA: Diagnosis not present

## 2022-06-02 DIAGNOSIS — I517 Cardiomegaly: Secondary | ICD-10-CM

## 2022-06-02 MED ORDER — HYDROCODONE-ACETAMINOPHEN 5-325 MG PO TABS: 0.5000 | ORAL_TABLET | Freq: Two times a day (BID) | ORAL | 0 refills | Status: DC | PRN

## 2022-06-02 NOTE — Progress Notes (Signed)
[facility] [place], [place] [address]  Internal MEDICINE  Telephone Visit  Patient Name: Stacy [name]  [id number]  [id number]  Date of Service: 06/02/2022  I connected with the patient at 1600 by telephone and verified the patients identity using two identifiers.   I discussed the limitations, risks, security and privacy concerns of performing an evaluation and management service by telephone and the availability of in person appointments. I also discussed with the patient that there may be a patient responsible charge related to the service.  The patient expressed understanding and agrees to proceed.    Chief Complaint  Patient presents with   Telephone Assessment    [id number]   Telephone Screen    ED follow up  falls and hits her head and back injury need pain medication    Hypertension    Home health,physical therapy  and aide [facility]     HPI Zoua presents for a telehealth virtual visit for ED follow up after fall.  -- Video call with patient was done with assistance from her son. Patient reports significant back pain since the fall and has been taking 1/2 tablet twice a day of hydrocodone-acetaminophen 5-325 mg.  She does need refill of this medication. Her caregiver is her son who is on the video with her and he is unable to drive and transport the patient due to his own health issues and is also limited in his physical ability to help her. Son does not know how to set up medicare transport. Patient also needs in-home aide for help with house management and ADLs since her son is unable to assist her. She is only able to transfer from bed to chair and back, has significant difficulty with balance and muscle weakness in her legs. She needs physical therapy to work with her for strengthening exercises and to help her be as mobile as possible on her own --prefer [facility] for services if possible    Current Medication: Outpatient  Encounter Medications as of 06/02/2022  Medication Sig   acetaminophen (TYLENOL) 325 MG tablet Take 650 mg by mouth every 6 (six) hours as needed for pain.   acidophilus (RISAQUAD) CAPS capsule Take 2 capsules by mouth daily.   diclofenac Sodium (VOLTAREN) 1 % GEL Apply 2 g topically 4 (four) times daily.   gabapentin (NEURONTIN) 100 MG capsule Take 2 tabs 3 x day for neuropathy by mouth   hydroxychloroquine (PLAQUENIL) 200 MG tablet Take 1 tablet by mouth twice a day    send to [facility]   lidocaine 4 % Place 1 patch onto the skin daily. As needed for pain   Multiple Vitamin (MULTIVITAMIN WITH MINERALS) TABS tablet Take 1 tablet by mouth daily.   pantoprazole (PROTONIX) 40 MG tablet Take 1 tablet (40 mg total) by mouth 2 (two) times daily.   predniSONE (DELTASONE) 10 MG tablet Take 1 tablet (10 mg total) by mouth daily with breakfast.   traMADol (ULTRAM) 50 MG tablet Take one tab by mouth with supper   [DISCONTINUED] HYDROcodone-acetaminophen (NORCO) 5-325 MG tablet Take 1 tablet by mouth every 6 (six) hours as needed for moderate pain.   HYDROcodone-acetaminophen (NORCO) 5-325 MG tablet Take 0.5-1 tablets by mouth 2 (two) times daily as needed for moderate pain or severe pain.   No facility-administered encounter medications on file as of 06/02/2022.    Surgical History: Past Surgical History:  Procedure Laterality Date   BREAST BIOPSY Left 2006   +  BREAST SURGERY Left 2006   lumpectomy   [place]   COLON SURGERY  2004   COLONOSCOPY  2014   Dr. Candace Cruise   EYE SURGERY  2002   [place]  2013   left breast biopsy   2011    Medical History: Past Medical History:  Diagnosis Date   Arthritis 1940   Asthma    Bowel trouble    Cancer ([facility]) 2006   DCIS left breast   Hypertension 1940   Incisional hernia 2014   Personal history of radiation therapy    Polymyalgia ([place])    Thyroid disorder    Vitiligo 2012    Family History: Family History   Problem Relation Age of Onset   Heart disease Mother    Emphysema Mother    Arthritis Father    Cancer Father    Breast cancer Neg Hx     Social History   Socioeconomic History   Marital status: Married    Spouse name: Not on file   Number of children: Not on file   Years of education: Not on file   Highest education level: Not on file  Occupational History   Not on file  Tobacco Use   Smoking status: Never   Smokeless tobacco: Never  Vaping Use   Vaping Use: Never used  Substance and Sexual Activity   Alcohol use: No   Drug use: No   Sexual activity: Not on file  Other Topics Concern   Not on file  Social History Narrative   Not on file   Social Determinants of Health   Financial Resource Strain: Low Risk  (04/07/2021)   Overall Financial Resource Strain (CARDIA)    Difficulty of Paying Living Expenses: Not very hard  Food Insecurity: Not on file  Transportation Needs: Not on file  Physical Activity: Not on file  Stress: Not on file  Social Connections: Not on file  Intimate Partner Violence: Not on file      Review of Systems  Constitutional:  Positive for activity change and fatigue.  Respiratory:  Negative for cough, chest tightness, shortness of breath and wheezing.   Cardiovascular: Negative.  Negative for chest pain and palpitations.  Gastrointestinal: Negative.   Musculoskeletal:  Positive for arthralgias, back pain and gait problem.  Skin: Negative.   Neurological:  Positive for weakness and headaches (a little residual pain from fall, not severe).  Hematological:  Bruises/bleeds easily.    Vital Signs: There were no vitals taken for this visit.   Observation/Objective: She is alert and oriented and engages in conversation appropriately. She does not appear to be in any acute distress over video call. Son was present during call as well.     Assessment/Plan: 1. Chronic respiratory failure with hypoxia (HCC) Need HH, PT, aide, easily  fatigued which respiratory failure and COPD contribute to.  - Ambulatory referral to Home Health  2. Chronic congestive heart failure, unspecified heart failure type ([place]) CHF results in SOB with minimal exertion and activity intolerance and her caregiver is unable to physically assist her with her care, she will need an in-home aide in possible - Ambulatory referral to [place]  3. Acute midline low back pain without sciatica Residual pain from fall, no acute fractures were found on imaging - HYDROcodone-acetaminophen (NORCO) 5-325 MG tablet; Take 0.5-1 tablets by mouth 2 (two) times daily as needed for moderate pain or severe pain.  Dispense: 60 tablet; Refill:  0  4. Polymyalgia rheumatica ([place]) - Ambulatory referral to Home Health  5. Cardiomegaly - Ambulatory referral to Home Health  6. History of recent fall - Ambulatory referral to [place] - HYDROcodone-acetaminophen (NORCO) 5-325 MG tablet; Take 0.5-1 tablets by mouth 2 (two) times daily as needed for moderate pain or severe pain.  Dispense: 60 tablet; Refill: 0  7. At risk for falls - Ambulatory referral to [place]  8. Primary generalized (osteo)arthritis - Ambulatory referral to [place] - HYDROcodone-acetaminophen (NORCO) 5-325 MG tablet; Take 0.5-1 tablets by mouth 2 (two) times daily as needed for moderate pain or severe pain.  Dispense: 60 tablet; Refill: 0   General Counseling: Lizzete verbalizes understanding of the findings of today's phone visit and agrees with plan of treatment. I have discussed any further diagnostic evaluation that may be needed or ordered today. We also reviewed her medications today. she has been encouraged to call the office with any questions or concerns that should arise related to todays visit.  Return for follow up as needed video for now but need in person once able .   Orders Placed This Encounter  Procedures   Ambulatory referral to [place] ordered this  encounter  Medications   HYDROcodone-acetaminophen (NORCO) 5-325 MG tablet    Sig: Take 0.5-1 tablets by mouth 2 (two) times daily as needed for moderate pain or severe pain.    Dispense:  60 tablet    Refill:  0    Time spent:30 Minutes Time spent with patient included reviewing progress notes, labs, imaging studies, and discussing plan for follow up.  [place] Controlled Substance Database was reviewed by me for overdose risk score (ORS) if appropriate.  This patient was seen by Jonetta Osgood, FNP-C in collaboration with Dr. Clayborn Bigness as a part of collaborative care agreement.  Zeffie Bickert R. Valetta Fuller, MSN, FNP-C Internal medicine

## 2022-06-03 ENCOUNTER — Encounter: Payer: Self-pay | Admitting: Nurse Practitioner

## 2022-06-05 ENCOUNTER — Other Ambulatory Visit: Payer: Self-pay | Admitting: Internal Medicine

## 2022-06-06 ENCOUNTER — Encounter: Payer: Self-pay | Admitting: Nurse Practitioner

## 2022-06-06 ENCOUNTER — Telehealth: Payer: Self-pay

## 2022-06-06 NOTE — Telephone Encounter (Signed)
Send message to [facility] and they take her referral and see her tomorrow

## 2022-06-07 ENCOUNTER — Ambulatory Visit: Payer: Medicare Other | Admitting: Internal Medicine

## 2022-06-07 DIAGNOSIS — M6281 Muscle weakness (generalized): Secondary | ICD-10-CM | POA: Diagnosis not present

## 2022-06-07 DIAGNOSIS — J9611 Chronic respiratory failure with hypoxia: Secondary | ICD-10-CM | POA: Diagnosis not present

## 2022-06-07 DIAGNOSIS — Z7969 Long term (current) use of other immunomodulators and immunosuppressants: Secondary | ICD-10-CM | POA: Diagnosis not present

## 2022-06-07 DIAGNOSIS — R262 Difficulty in walking, not elsewhere classified: Secondary | ICD-10-CM | POA: Diagnosis not present

## 2022-06-07 DIAGNOSIS — J449 Chronic obstructive pulmonary disease, unspecified: Secondary | ICD-10-CM | POA: Diagnosis not present

## 2022-06-07 DIAGNOSIS — E079 Disorder of thyroid, unspecified: Secondary | ICD-10-CM | POA: Diagnosis not present

## 2022-06-07 DIAGNOSIS — I509 Heart failure, unspecified: Secondary | ICD-10-CM | POA: Diagnosis not present

## 2022-06-07 DIAGNOSIS — Z7952 Long term (current) use of systemic steroids: Secondary | ICD-10-CM | POA: Diagnosis not present

## 2022-06-07 DIAGNOSIS — M150 Primary generalized (osteo)arthritis: Secondary | ICD-10-CM | POA: Diagnosis not present

## 2022-06-07 DIAGNOSIS — Z853 Personal history of malignant neoplasm of breast: Secondary | ICD-10-CM | POA: Diagnosis not present

## 2022-06-07 DIAGNOSIS — Z9981 Dependence on supplemental oxygen: Secondary | ICD-10-CM | POA: Diagnosis not present

## 2022-06-07 DIAGNOSIS — W19XXXD Unspecified fall, subsequent encounter: Secondary | ICD-10-CM | POA: Diagnosis not present

## 2022-06-07 DIAGNOSIS — Z79891 Long term (current) use of opiate analgesic: Secondary | ICD-10-CM | POA: Diagnosis not present

## 2022-06-07 DIAGNOSIS — Z9181 History of falling: Secondary | ICD-10-CM | POA: Diagnosis not present

## 2022-06-07 DIAGNOSIS — I110 Hypertensive heart disease with heart failure: Secondary | ICD-10-CM | POA: Diagnosis not present

## 2022-06-07 DIAGNOSIS — R296 Repeated falls: Secondary | ICD-10-CM | POA: Diagnosis not present

## 2022-06-07 DIAGNOSIS — M545 Low back pain, unspecified: Secondary | ICD-10-CM | POA: Diagnosis not present

## 2022-06-07 DIAGNOSIS — M353 Polymyalgia rheumatica: Secondary | ICD-10-CM | POA: Diagnosis not present

## 2022-06-09 ENCOUNTER — Telehealth: Payer: Self-pay

## 2022-06-09 NOTE — Telephone Encounter (Signed)
Tiffany [phone] option 2) from [facility] requesting verbal orders for  Home Health NURSING: 1 x a week fr 9 weeks AIDE for 1 X week for 1 week and then 2 x week for 3 weeks  Called number above back and spoke with Otis Peak RN after hours at [facility] and gave her the approval for the requested orders.

## 2022-06-10 DIAGNOSIS — W19XXXD Unspecified fall, subsequent encounter: Secondary | ICD-10-CM | POA: Diagnosis not present

## 2022-06-10 DIAGNOSIS — M545 Low back pain, unspecified: Secondary | ICD-10-CM | POA: Diagnosis not present

## 2022-06-10 DIAGNOSIS — J9611 Chronic respiratory failure with hypoxia: Secondary | ICD-10-CM | POA: Diagnosis not present

## 2022-06-10 DIAGNOSIS — I110 Hypertensive heart disease with heart failure: Secondary | ICD-10-CM | POA: Diagnosis not present

## 2022-06-10 DIAGNOSIS — I509 Heart failure, unspecified: Secondary | ICD-10-CM | POA: Diagnosis not present

## 2022-06-10 DIAGNOSIS — J449 Chronic obstructive pulmonary disease, unspecified: Secondary | ICD-10-CM | POA: Diagnosis not present

## 2022-06-13 DIAGNOSIS — M545 Low back pain, unspecified: Secondary | ICD-10-CM | POA: Diagnosis not present

## 2022-06-13 DIAGNOSIS — I110 Hypertensive heart disease with heart failure: Secondary | ICD-10-CM | POA: Diagnosis not present

## 2022-06-13 DIAGNOSIS — J449 Chronic obstructive pulmonary disease, unspecified: Secondary | ICD-10-CM | POA: Diagnosis not present

## 2022-06-13 DIAGNOSIS — W19XXXD Unspecified fall, subsequent encounter: Secondary | ICD-10-CM | POA: Diagnosis not present

## 2022-06-13 DIAGNOSIS — J9611 Chronic respiratory failure with hypoxia: Secondary | ICD-10-CM | POA: Diagnosis not present

## 2022-06-13 DIAGNOSIS — I509 Heart failure, unspecified: Secondary | ICD-10-CM | POA: Diagnosis not present

## 2022-06-15 DIAGNOSIS — I714 Abdominal aortic aneurysm, without rupture, unspecified: Secondary | ICD-10-CM | POA: Insufficient documentation

## 2022-06-15 NOTE — Progress Notes (Signed)
MRN : [id number]  Stacy [name] is a 88 y.o. (1926-11-23) female who presents with chief complaint of check circulation.  History of Present Illness:  The patient presents to the office for evaluation of an abdominal aortic aneurysm. The aneurysm was found incidentally by CT scan being done to assess the spine for trauma. Patient denies abdominal pain or unusual back pain, no other abdominal complaints.  No history of an abrupt onset of a painful toe associated with blue discoloration.     No family history of AAA.   Patient denies amaurosis fugax or TIA symptoms. There is no history of claudication or rest pain symptoms of the lower extremities.  The patient denies angina or shortness of breath.  CT scan shows a supra renal  AAA that measures 3.10 cm not ruptured  No outpatient medications have been marked as taking for the 06/16/22 encounter (Appointment) with Delana Meyer, Dolores Lory, MD.    Past Medical History:  Diagnosis Date   Arthritis 1940   Asthma    Bowel trouble    Cancer [facility]) 2006   DCIS left breast   Hypertension 1940   Incisional hernia 2014   Personal history of radiation therapy    Polymyalgia ([place])    Thyroid disorder    Vitiligo 2012    Past Surgical History:  Procedure Laterality Date   BREAST BIOPSY Left 2006   +   BREAST SURGERY Left 2006   lumpectomy   [place]  2004   COLONOSCOPY  2014   Dr. Candace Cruise   EYE SURGERY  2002   FOOT SURGERY  1996   HERNIA REPAIR  2013   left breast biopsy   2011    Social History Social History   Tobacco Use   Smoking status: Never   Smokeless tobacco: Never  Vaping Use   Vaping Use: Never used  Substance Use Topics   Alcohol use: No   Drug use: No    Family History Family History  Problem Relation Age of Onset   Heart disease Mother    Emphysema Mother    Arthritis Father    Cancer Father    Breast cancer Neg Hx     Allergies  Allergen Reactions   Aspirin  Itching and Other (See Comments)    Dizzy/passing out   Augmentin [Amoxicillin-Pot Clavulanate]    Codeine Other (See Comments)    dizziness   Doxycycline Hyclate Other (See Comments)   Sulfamethoxazole-Trimethoprim Diarrhea   Tape Itching   Vitamin D Analogs    Azithromycin Rash and Hives     REVIEW OF SYSTEMS (Negative unless checked)  Constitutional: '[]'$ Weight loss  '[]'$ Fever  '[]'$ Chills Cardiac: '[]'$ Chest pain   '[]'$ Chest pressure   '[]'$ Palpitations   '[]'$ Shortness of breath when laying flat   '[]'$ Shortness of breath with exertion. Vascular:  '[x]'$ Pain in legs with walking   '[]'$ Pain in legs at rest  '[]'$ History of DVT   '[]'$ Phlebitis   '[]'$ Swelling in legs   '[]'$ Varicose veins   '[]'$ Non-healing ulcers Pulmonary:   '[]'$ Uses home oxygen   '[]'$ Productive cough   '[]'$ Hemoptysis   '[]'$ Wheeze  '[]'$ COPD   '[]'$ Asthma Neurologic:  '[]'$ Dizziness   '[]'$ Seizures   '[]'$ History of stroke   '[]'$ History of TIA  '[]'$ Aphasia   '[]'$ Vissual changes   '[]'$ Weakness or numbness in arm   '[]'$ Weakness or numbness in leg Musculoskeletal:   '[]'$ Joint swelling   '[]'$   Joint pain   '[]'$ Low back pain Hematologic:  '[]'$ Easy bruising  '[]'$ Easy bleeding   '[]'$ Hypercoagulable state   '[]'$ Anemic Gastrointestinal:  '[]'$ Diarrhea   '[]'$ Vomiting  '[]'$ Gastroesophageal reflux/heartburn   '[]'$ Difficulty swallowing. Genitourinary:  '[]'$ Chronic kidney disease   '[]'$ Difficult urination  '[]'$ Frequent urination   '[]'$ Blood in urine Skin:  '[]'$ Rashes   '[]'$ Ulcers  Psychological:  '[]'$ History of anxiety   '[]'$  History of major depression.  Physical Examination  There were no vitals filed for this visit. There is no height or weight on file to calculate BMI. Gen: WD/WN, NAD Head: [place]/AT, No temporalis wasting.  Ear/Nose/Throat: Hearing grossly intact, nares w/o erythema or drainage Eyes: PER, EOMI, sclera nonicteric.  Neck: Supple, no masses.  No bruit or JVD.  Pulmonary:  Good air movement, no audible wheezing, no use of accessory muscles.  Cardiac: RRR, normal S1, S2, no Murmurs. Vascular:  mild trophic changes, no  open wounds Vessel Right Left  Radial Palpable Palpable  Gastrointestinal: soft, non-distended. No guarding/no peritoneal signs.  Musculoskeletal: M/S 5/5 throughout.  No visible deformity.  Neurologic: CN 2-12 intact. Pain and light touch intact in extremities.  Symmetrical.  Speech is fluent. Motor exam as listed above. Psychiatric: Judgment intact, Mood & affect appropriate for pt's clinical situation. Dermatologic: No rashes or ulcers noted.  No changes consistent with cellulitis.   CBC Lab Results  Component Value Date   WBC 8.4 03/14/2021   HGB 9.7 (L) 03/14/2021   HCT 29.7 (L) 03/14/2021   MCV 88.9 03/14/2021   PLT 167 03/14/2021    BMET    Component Value Date/Time   NA 138 03/14/2021 0517   NA 138 03/06/2013 1436   K 4.2 03/14/2021 0517   K 4.0 03/06/2013 1436   CL 110 03/14/2021 0517   CL 103 03/06/2013 1436   CO2 25 03/14/2021 0517   CO2 29 03/06/2013 1436   GLUCOSE 84 03/14/2021 0517   GLUCOSE 90 03/06/2013 1436   BUN 10 03/14/2021 0517   BUN 18 03/06/2013 1436   CREATININE 1.00 03/16/2021 0454   CREATININE 0.83 03/06/2013 1436   CALCIUM 7.6 (L) 03/14/2021 0517   CALCIUM 9.5 03/06/2013 1436   GFRNONAA 53 (L) 03/16/2021 0454   GFRNONAA >60 03/06/2013 1436   GFRAA >60 03/06/2013 1436   CrCl cannot be calculated (Patient's most recent lab result is older than the maximum 21 days allowed.).  COAG Lab Results  Component Value Date   INR 0.8 05/09/2012    Radiology CT Head Wo Contrast  Result Date: 05/27/2022 CLINICAL DATA:  Head trauma, minor (Age >= 65y). Per ed notes: Pt states she fell backwards in a chair hitting head on wall. Pt with hematoma to posterior scalp, pt denies neck pain, headache or other injury. Pt denies LOC. EXAM: CT HEAD WITHOUT CONTRAST TECHNIQUE: Contiguous axial images were obtained from the base of the skull through the vertex without intravenous contrast. RADIATION DOSE REDUCTION: This exam was performed according to the  departmental dose-optimization program which includes automated exposure control, adjustment of the mA and/or kV according to patient size and/or use of iterative reconstruction technique. COMPARISON:  CT head 04/01/2010 BRAIN: BRAIN Cerebral ventricle sizes are concordant with the degree of cerebral volume loss. Patchy and confluent areas of decreased attenuation are noted throughout the deep and periventricular white matter of the cerebral hemispheres bilaterally, compatible with chronic microvascular ischemic disease. No evidence of large-territorial acute infarction. No parenchymal hemorrhage. No mass lesion. No extra-axial collection. No mass effect or midline shift. No  hydrocephalus. Basilar cisterns are patent. Vascular: No hyperdense vessel. Atherosclerotic calcifications are present within the cavernous internal carotid arteries. Skull: No acute fracture or focal lesion. Sinuses/Orbits: Paranasal sinuses and mastoid air cells are clear. Bilateral lens replacement. Otherwise the orbits are unremarkable. Other: Small right frontoparietal scalp hematoma formation. IMPRESSION: No acute intracranial abnormality. Electronically Signed   By: Iven Finn M.D.   On: 05/27/2022 23:24   CT Cervical Spine Wo Contrast  Result Date: 05/27/2022 CLINICAL DATA:  Neck trauma (Age >= 65y); Back trauma, no prior imaging (Age >= 16y). Per ed notes: Pt states she fell backwards in a chair hitting head on wall. Pt with hematoma to posterior scalp, pt denies neck pain, headache or other injury. Pt denies LOC. EXAM: CT CERVICAL, THORACIC, AND LUMBAR SPINE WITHOUT CONTRAST TECHNIQUE: Multidetector CT imaging of the cervical, thoracic and lumbar spine was performed without intravenous contrast. Multiplanar CT image reconstructions were also generated. RADIATION DOSE REDUCTION: This exam was performed according to the departmental dose-optimization program which includes automated exposure control, adjustment of the mA and/or  kV according to patient size and/or use of iterative reconstruction technique. COMPARISON:  CT abdomen pelvis 03/07/2021, x-ray cervical spine 07/02/2014 FINDINGS: CT CERVICAL SPINE FINDINGS Alignment: Straightening of the normal cervical lordosis likely due to positioning and degenerative changes. Skull base and vertebrae: Multilevel moderate to severe degenerative changes of the spine with associated multilevel moderate to severe osseous neural foraminal stenosis. No severe osseous central canal stenosis. No acute fracture. No aggressive appearing focal osseous lesion or focal pathologic process. Soft tissues and spinal canal: No prevertebral fluid or swelling. No visible canal hematoma. Upper chest: Unremarkable. Other: None. CT THORACIC SPINE FINDINGS Alignment: Normal. Vertebrae: Diffusely decreased bone density. Mild multilevel osteophyte formation. No acute fracture or focal pathologic process. Paraspinal and other soft tissues: Negative. Disc levels: Maintained. CT LUMBAR SPINE FINDINGS Segmentation: 5 lumbar type vertebrae. Alignment: Normal. Vertebrae: Diffusely decreased bone density. Mild osteophyte formation and mild facet arthropathy. No acute fracture or focal pathologic process. Paraspinal and other soft tissues: Negative. Disc levels: Moderate to severe intervertebral disc space narrowing at the L5-S1 level. Other: Cardiomegaly. Atherosclerotic plaque. Aneurysmal suprarenal abdominal aorta measuring up to 3.2 x 3 cm (5:29 lumbar spine). IMPRESSION: 1. No acute displaced fracture or traumatic listhesis of the cervical, thoracic, lumbar spine. 2. Multilevel moderate to severe degenerative changes of the cervical spine with associated multilevel cervical spine moderate to severe osseous neural foraminal stenosis. 3. Diffusely decreased bone density. Other imaging findings of potential clinical significance: 1. Cardiomegaly. 2. Aneurysmal suprarenal abdominal aorta (3.2 cm). Recommend follow-up  ultrasound every 3 years. This recommendation follows ACR consensus guidelines: White Paper of the ACR Incidental Findings Committee II on Vascular Findings. J Am Coll Radiol 2013; 10:789-794. Aortic aneurysm NOS (ICD10-I71.9). 3.  Aortic Atherosclerosis (ICD10-I70.0). Electronically Signed   By: Iven Finn M.D.   On: 05/27/2022 23:18   CT Thoracic Spine Wo Contrast  Result Date: 05/27/2022 CLINICAL DATA:  Neck trauma (Age >= 65y); Back trauma, no prior imaging (Age >= 16y). Per ed notes: Pt states she fell backwards in a chair hitting head on wall. Pt with hematoma to posterior scalp, pt denies neck pain, headache or other injury. Pt denies LOC. EXAM: CT CERVICAL, THORACIC, AND LUMBAR SPINE WITHOUT CONTRAST TECHNIQUE: Multidetector CT imaging of the cervical, thoracic and lumbar spine was performed without intravenous contrast. Multiplanar CT image reconstructions were also generated. RADIATION DOSE REDUCTION: This exam was performed according to the departmental dose-optimization program  which includes automated exposure control, adjustment of the mA and/or kV according to patient size and/or use of iterative reconstruction technique. COMPARISON:  CT abdomen pelvis 03/07/2021, x-ray cervical spine 07/02/2014 FINDINGS: CT CERVICAL SPINE FINDINGS Alignment: Straightening of the normal cervical lordosis likely due to positioning and degenerative changes. Skull base and vertebrae: Multilevel moderate to severe degenerative changes of the spine with associated multilevel moderate to severe osseous neural foraminal stenosis. No severe osseous central canal stenosis. No acute fracture. No aggressive appearing focal osseous lesion or focal pathologic process. Soft tissues and spinal canal: No prevertebral fluid or swelling. No visible canal hematoma. Upper chest: Unremarkable. Other: None. CT THORACIC SPINE FINDINGS Alignment: Normal. Vertebrae: Diffusely decreased bone density. Mild multilevel osteophyte  formation. No acute fracture or focal pathologic process. Paraspinal and other soft tissues: Negative. Disc levels: Maintained. CT LUMBAR SPINE FINDINGS Segmentation: 5 lumbar type vertebrae. Alignment: Normal. Vertebrae: Diffusely decreased bone density. Mild osteophyte formation and mild facet arthropathy. No acute fracture or focal pathologic process. Paraspinal and other soft tissues: Negative. Disc levels: Moderate to severe intervertebral disc space narrowing at the L5-S1 level. Other: Cardiomegaly. Atherosclerotic plaque. Aneurysmal suprarenal abdominal aorta measuring up to 3.2 x 3 cm (5:29 lumbar spine). IMPRESSION: 1. No acute displaced fracture or traumatic listhesis of the cervical, thoracic, lumbar spine. 2. Multilevel moderate to severe degenerative changes of the cervical spine with associated multilevel cervical spine moderate to severe osseous neural foraminal stenosis. 3. Diffusely decreased bone density. Other imaging findings of potential clinical significance: 1. Cardiomegaly. 2. Aneurysmal suprarenal abdominal aorta (3.2 cm). Recommend follow-up ultrasound every 3 years. This recommendation follows ACR consensus guidelines: White Paper of the ACR Incidental Findings Committee II on Vascular Findings. J Am Coll Radiol 2013; 10:789-794. Aortic aneurysm NOS (ICD10-I71.9). 3.  Aortic Atherosclerosis (ICD10-I70.0). Electronically Signed   By: Iven Finn M.D.   On: 05/27/2022 23:18   CT Lumbar Spine Wo Contrast  Result Date: 05/27/2022 CLINICAL DATA:  Neck trauma (Age >= 65y); Back trauma, no prior imaging (Age >= 16y). Per ed notes: Pt states she fell backwards in a chair hitting head on wall. Pt with hematoma to posterior scalp, pt denies neck pain, headache or other injury. Pt denies LOC. EXAM: CT CERVICAL, THORACIC, AND LUMBAR SPINE WITHOUT CONTRAST TECHNIQUE: Multidetector CT imaging of the cervical, thoracic and lumbar spine was performed without intravenous contrast. Multiplanar CT  image reconstructions were also generated. RADIATION DOSE REDUCTION: This exam was performed according to the departmental dose-optimization program which includes automated exposure control, adjustment of the mA and/or kV according to patient size and/or use of iterative reconstruction technique. COMPARISON:  CT abdomen pelvis 03/07/2021, x-ray cervical spine 07/02/2014 FINDINGS: CT CERVICAL SPINE FINDINGS Alignment: Straightening of the normal cervical lordosis likely due to positioning and degenerative changes. Skull base and vertebrae: Multilevel moderate to severe degenerative changes of the spine with associated multilevel moderate to severe osseous neural foraminal stenosis. No severe osseous central canal stenosis. No acute fracture. No aggressive appearing focal osseous lesion or focal pathologic process. Soft tissues and spinal canal: No prevertebral fluid or swelling. No visible canal hematoma. Upper chest: Unremarkable. Other: None. CT THORACIC SPINE FINDINGS Alignment: Normal. Vertebrae: Diffusely decreased bone density. Mild multilevel osteophyte formation. No acute fracture or focal pathologic process. Paraspinal and other soft tissues: Negative. Disc levels: Maintained. CT LUMBAR SPINE FINDINGS Segmentation: 5 lumbar type vertebrae. Alignment: Normal. Vertebrae: Diffusely decreased bone density. Mild osteophyte formation and mild facet arthropathy. No acute fracture or focal pathologic process. Paraspinal  and other soft tissues: Negative. Disc levels: Moderate to severe intervertebral disc space narrowing at the L5-S1 level. Other: Cardiomegaly. Atherosclerotic plaque. Aneurysmal suprarenal abdominal aorta measuring up to 3.2 x 3 cm (5:29 lumbar spine). IMPRESSION: 1. No acute displaced fracture or traumatic listhesis of the cervical, thoracic, lumbar spine. 2. Multilevel moderate to severe degenerative changes of the cervical spine with associated multilevel cervical spine moderate to severe osseous  neural foraminal stenosis. 3. Diffusely decreased bone density. Other imaging findings of potential clinical significance: 1. Cardiomegaly. 2. Aneurysmal suprarenal abdominal aorta (3.2 cm). Recommend follow-up ultrasound every 3 years. This recommendation follows ACR consensus guidelines: White Paper of the ACR Incidental Findings Committee II on Vascular Findings. J Am Coll Radiol 2013; 10:789-794. Aortic aneurysm NOS (ICD10-I71.9). 3.  Aortic Atherosclerosis (ICD10-I70.0). Electronically Signed   By: Iven Finn M.D.   On: 05/27/2022 23:18     Assessment/Plan 1. Juxtarenal abdominal aortic aneurysm (AAA) without rupture (HCC) Recommend: No surgery or intervention is indicated at this time.  The patient has an asymptomatic abdominal aortic aneurysm that is 3.1 cm in maximal diameter and suprarenal.    I have reviewed the natural history of abdominal aortic aneurysm and the small risk of rupture for aneurysm less than 5 cm in size.  However, given her age and the small size of the aneurysm as well as the fact that it is supra renal it is virtually impossible that she will need repair.  She will follow up PRN.   I have also discussed optimizing medical management with hypertension and lipid control and the importance of abstinence from tobacco.  The patient is also encouraged to exercise a minimum of 30 minutes 4 times a week.   Should the patient develop new onset abdominal or back pain or signs of peripheral embolization they are instructed to seek medical attention immediately and to alert the physician providing care that they have an aneurysm.   The patient voices their understanding.  The patient will return PRN.   2. Essential hypertension Continue antihypertensive medications as already ordered, these medications have been reviewed and there are no changes at this time.   3. Congestive heart failure, unspecified HF chronicity, unspecified heart failure type ([place]) Continue  cardiac and antihypertensive medications as already ordered and reviewed, no changes at this time.  Continue statin as ordered and reviewed, no changes at this time  Nitrates PRN for chest pain   4. Chronic obstructive pulmonary disease, unspecified COPD type ([place]) Continue pulmonary medications and aerosols as already ordered, these medications have been reviewed and there are no changes at this time.    5. Mixed hyperlipidemia Continue statin as ordered and reviewed, no changes at this time     Hortencia Pilar, MD  06/15/2022 10:06 AM

## 2022-06-16 ENCOUNTER — Telehealth: Payer: Self-pay

## 2022-06-16 ENCOUNTER — Ambulatory Visit (INDEPENDENT_AMBULATORY_CARE_PROVIDER_SITE_OTHER): Payer: Medicare Other | Admitting: Vascular Surgery

## 2022-06-16 DIAGNOSIS — J449 Chronic obstructive pulmonary disease, unspecified: Secondary | ICD-10-CM | POA: Diagnosis not present

## 2022-06-16 DIAGNOSIS — I509 Heart failure, unspecified: Secondary | ICD-10-CM | POA: Diagnosis not present

## 2022-06-16 DIAGNOSIS — E782 Mixed hyperlipidemia: Secondary | ICD-10-CM

## 2022-06-16 DIAGNOSIS — I10 Essential (primary) hypertension: Secondary | ICD-10-CM | POA: Diagnosis not present

## 2022-06-16 DIAGNOSIS — I7142 Juxtarenal abdominal aortic aneurysm, without rupture: Secondary | ICD-10-CM

## 2022-06-16 NOTE — Telephone Encounter (Signed)
Gave verbal order to [facility] [phone] once a week for 1 week and 2 times a week for 4 week

## 2022-06-17 DIAGNOSIS — W19XXXD Unspecified fall, subsequent encounter: Secondary | ICD-10-CM | POA: Diagnosis not present

## 2022-06-17 DIAGNOSIS — J9611 Chronic respiratory failure with hypoxia: Secondary | ICD-10-CM | POA: Diagnosis not present

## 2022-06-17 DIAGNOSIS — I110 Hypertensive heart disease with heart failure: Secondary | ICD-10-CM | POA: Diagnosis not present

## 2022-06-17 DIAGNOSIS — M545 Low back pain, unspecified: Secondary | ICD-10-CM | POA: Diagnosis not present

## 2022-06-17 DIAGNOSIS — I509 Heart failure, unspecified: Secondary | ICD-10-CM | POA: Diagnosis not present

## 2022-06-17 DIAGNOSIS — J449 Chronic obstructive pulmonary disease, unspecified: Secondary | ICD-10-CM | POA: Diagnosis not present

## 2022-06-19 ENCOUNTER — Encounter (INDEPENDENT_AMBULATORY_CARE_PROVIDER_SITE_OTHER): Payer: Self-pay | Admitting: Vascular Surgery

## 2022-06-21 DIAGNOSIS — W19XXXD Unspecified fall, subsequent encounter: Secondary | ICD-10-CM | POA: Diagnosis not present

## 2022-06-21 DIAGNOSIS — M545 Low back pain, unspecified: Secondary | ICD-10-CM | POA: Diagnosis not present

## 2022-06-21 DIAGNOSIS — I110 Hypertensive heart disease with heart failure: Secondary | ICD-10-CM | POA: Diagnosis not present

## 2022-06-21 DIAGNOSIS — J9611 Chronic respiratory failure with hypoxia: Secondary | ICD-10-CM | POA: Diagnosis not present

## 2022-06-21 DIAGNOSIS — I509 Heart failure, unspecified: Secondary | ICD-10-CM | POA: Diagnosis not present

## 2022-06-21 DIAGNOSIS — J449 Chronic obstructive pulmonary disease, unspecified: Secondary | ICD-10-CM | POA: Diagnosis not present

## 2022-06-22 DIAGNOSIS — J449 Chronic obstructive pulmonary disease, unspecified: Secondary | ICD-10-CM | POA: Diagnosis not present

## 2022-06-22 DIAGNOSIS — W19XXXD Unspecified fall, subsequent encounter: Secondary | ICD-10-CM | POA: Diagnosis not present

## 2022-06-22 DIAGNOSIS — M545 Low back pain, unspecified: Secondary | ICD-10-CM | POA: Diagnosis not present

## 2022-06-22 DIAGNOSIS — I110 Hypertensive heart disease with heart failure: Secondary | ICD-10-CM | POA: Diagnosis not present

## 2022-06-22 DIAGNOSIS — J9611 Chronic respiratory failure with hypoxia: Secondary | ICD-10-CM | POA: Diagnosis not present

## 2022-06-22 DIAGNOSIS — I509 Heart failure, unspecified: Secondary | ICD-10-CM | POA: Diagnosis not present

## 2022-06-23 ENCOUNTER — Telehealth: Payer: Self-pay

## 2022-06-23 NOTE — Telephone Encounter (Signed)
Leigh Aurora RN [phone]) called requesting verbal orders for placing a Calcium Alginate and dry dressing to a wound on the right abdomin.  Per Dr Humphrey Rolls I gave verbal approval

## 2022-06-24 DIAGNOSIS — W19XXXD Unspecified fall, subsequent encounter: Secondary | ICD-10-CM | POA: Diagnosis not present

## 2022-06-24 DIAGNOSIS — J449 Chronic obstructive pulmonary disease, unspecified: Secondary | ICD-10-CM | POA: Diagnosis not present

## 2022-06-24 DIAGNOSIS — I509 Heart failure, unspecified: Secondary | ICD-10-CM | POA: Diagnosis not present

## 2022-06-24 DIAGNOSIS — M545 Low back pain, unspecified: Secondary | ICD-10-CM | POA: Diagnosis not present

## 2022-06-24 DIAGNOSIS — I110 Hypertensive heart disease with heart failure: Secondary | ICD-10-CM | POA: Diagnosis not present

## 2022-06-24 DIAGNOSIS — J9611 Chronic respiratory failure with hypoxia: Secondary | ICD-10-CM | POA: Diagnosis not present

## 2022-06-28 DIAGNOSIS — J449 Chronic obstructive pulmonary disease, unspecified: Secondary | ICD-10-CM | POA: Diagnosis not present

## 2022-06-28 DIAGNOSIS — I110 Hypertensive heart disease with heart failure: Secondary | ICD-10-CM | POA: Diagnosis not present

## 2022-06-28 DIAGNOSIS — I509 Heart failure, unspecified: Secondary | ICD-10-CM | POA: Diagnosis not present

## 2022-06-28 DIAGNOSIS — J9611 Chronic respiratory failure with hypoxia: Secondary | ICD-10-CM | POA: Diagnosis not present

## 2022-06-28 DIAGNOSIS — W19XXXD Unspecified fall, subsequent encounter: Secondary | ICD-10-CM | POA: Diagnosis not present

## 2022-06-28 DIAGNOSIS — M545 Low back pain, unspecified: Secondary | ICD-10-CM | POA: Diagnosis not present

## 2022-06-29 DIAGNOSIS — J449 Chronic obstructive pulmonary disease, unspecified: Secondary | ICD-10-CM | POA: Diagnosis not present

## 2022-06-29 DIAGNOSIS — I110 Hypertensive heart disease with heart failure: Secondary | ICD-10-CM | POA: Diagnosis not present

## 2022-06-29 DIAGNOSIS — J9611 Chronic respiratory failure with hypoxia: Secondary | ICD-10-CM | POA: Diagnosis not present

## 2022-06-29 DIAGNOSIS — W19XXXD Unspecified fall, subsequent encounter: Secondary | ICD-10-CM | POA: Diagnosis not present

## 2022-06-29 DIAGNOSIS — M545 Low back pain, unspecified: Secondary | ICD-10-CM | POA: Diagnosis not present

## 2022-06-29 DIAGNOSIS — I509 Heart failure, unspecified: Secondary | ICD-10-CM | POA: Diagnosis not present

## 2022-06-30 ENCOUNTER — Telehealth: Payer: Self-pay

## 2022-06-30 DIAGNOSIS — J9611 Chronic respiratory failure with hypoxia: Secondary | ICD-10-CM | POA: Diagnosis not present

## 2022-06-30 DIAGNOSIS — I110 Hypertensive heart disease with heart failure: Secondary | ICD-10-CM | POA: Diagnosis not present

## 2022-06-30 DIAGNOSIS — W19XXXD Unspecified fall, subsequent encounter: Secondary | ICD-10-CM | POA: Diagnosis not present

## 2022-06-30 DIAGNOSIS — J449 Chronic obstructive pulmonary disease, unspecified: Secondary | ICD-10-CM | POA: Diagnosis not present

## 2022-06-30 DIAGNOSIS — I509 Heart failure, unspecified: Secondary | ICD-10-CM | POA: Diagnosis not present

## 2022-06-30 DIAGNOSIS — M545 Low back pain, unspecified: Secondary | ICD-10-CM | POA: Diagnosis not present

## 2022-06-30 NOTE — Telephone Encounter (Signed)
Faxed signed order for homehealth to [facility]

## 2022-07-01 DIAGNOSIS — M545 Low back pain, unspecified: Secondary | ICD-10-CM | POA: Diagnosis not present

## 2022-07-01 DIAGNOSIS — J449 Chronic obstructive pulmonary disease, unspecified: Secondary | ICD-10-CM | POA: Diagnosis not present

## 2022-07-01 DIAGNOSIS — W19XXXD Unspecified fall, subsequent encounter: Secondary | ICD-10-CM | POA: Diagnosis not present

## 2022-07-01 DIAGNOSIS — I509 Heart failure, unspecified: Secondary | ICD-10-CM | POA: Diagnosis not present

## 2022-07-01 DIAGNOSIS — J9611 Chronic respiratory failure with hypoxia: Secondary | ICD-10-CM | POA: Diagnosis not present

## 2022-07-01 DIAGNOSIS — I110 Hypertensive heart disease with heart failure: Secondary | ICD-10-CM | POA: Diagnosis not present

## 2022-07-03 ENCOUNTER — Inpatient Hospital Stay: Payer: Medicare Other

## 2022-07-03 ENCOUNTER — Emergency Department: Payer: Medicare Other

## 2022-07-03 ENCOUNTER — Other Ambulatory Visit: Payer: Self-pay

## 2022-07-03 ENCOUNTER — Encounter: Payer: Self-pay | Admitting: Emergency Medicine

## 2022-07-03 ENCOUNTER — Inpatient Hospital Stay
Admission: EM | Admit: 2022-07-03 | Discharge: 2022-07-06 | DRG: 291 | Disposition: A | Discharge status: Home Health | Payer: Medicare Other | Attending: Obstetrics and Gynecology | Admitting: Obstetrics and Gynecology

## 2022-07-03 DIAGNOSIS — Z79899 Other long term (current) drug therapy: Secondary | ICD-10-CM

## 2022-07-03 DIAGNOSIS — R279 Unspecified lack of coordination: Secondary | ICD-10-CM | POA: Diagnosis not present

## 2022-07-03 DIAGNOSIS — Z853 Personal history of malignant neoplasm of breast: Secondary | ICD-10-CM

## 2022-07-03 DIAGNOSIS — Z743 Need for continuous supervision: Secondary | ICD-10-CM | POA: Diagnosis not present

## 2022-07-03 DIAGNOSIS — I509 Heart failure, unspecified: Secondary | ICD-10-CM

## 2022-07-03 DIAGNOSIS — N3000 Acute cystitis without hematuria: Secondary | ICD-10-CM | POA: Diagnosis present

## 2022-07-03 DIAGNOSIS — Z66 Do not resuscitate: Secondary | ICD-10-CM | POA: Diagnosis present

## 2022-07-03 DIAGNOSIS — E669 Obesity, unspecified: Secondary | ICD-10-CM | POA: Diagnosis present

## 2022-07-03 DIAGNOSIS — Z79891 Long term (current) use of opiate analgesic: Secondary | ICD-10-CM

## 2022-07-03 DIAGNOSIS — N179 Acute kidney failure, unspecified: Secondary | ICD-10-CM | POA: Diagnosis present

## 2022-07-03 DIAGNOSIS — I2489 Other forms of acute ischemic heart disease: Secondary | ICD-10-CM | POA: Diagnosis present

## 2022-07-03 DIAGNOSIS — J449 Chronic obstructive pulmonary disease, unspecified: Secondary | ICD-10-CM | POA: Diagnosis not present

## 2022-07-03 DIAGNOSIS — I130 Hypertensive heart and chronic kidney disease with heart failure and stage 1 through stage 4 chronic kidney disease, or unspecified chronic kidney disease: Principal | ICD-10-CM | POA: Diagnosis present

## 2022-07-03 DIAGNOSIS — B961 Klebsiella pneumoniae [K. pneumoniae] as the cause of diseases classified elsewhere: Secondary | ICD-10-CM | POA: Diagnosis present

## 2022-07-03 DIAGNOSIS — D649 Anemia, unspecified: Secondary | ICD-10-CM | POA: Diagnosis present

## 2022-07-03 DIAGNOSIS — I4891 Unspecified atrial fibrillation: Secondary | ICD-10-CM | POA: Diagnosis present

## 2022-07-03 DIAGNOSIS — Z9049 Acquired absence of other specified parts of digestive tract: Secondary | ICD-10-CM

## 2022-07-03 DIAGNOSIS — I4819 Other persistent atrial fibrillation: Secondary | ICD-10-CM | POA: Diagnosis present

## 2022-07-03 DIAGNOSIS — M87152 Osteonecrosis due to drugs, left femur: Secondary | ICD-10-CM | POA: Diagnosis present

## 2022-07-03 DIAGNOSIS — Z8261 Family history of arthritis: Secondary | ICD-10-CM

## 2022-07-03 DIAGNOSIS — Z881 Allergy status to other antibiotic agents status: Secondary | ICD-10-CM

## 2022-07-03 DIAGNOSIS — D631 Anemia in chronic kidney disease: Secondary | ICD-10-CM | POA: Diagnosis not present

## 2022-07-03 DIAGNOSIS — E785 Hyperlipidemia, unspecified: Secondary | ICD-10-CM | POA: Diagnosis not present

## 2022-07-03 DIAGNOSIS — Z886 Allergy status to analgesic agent status: Secondary | ICD-10-CM

## 2022-07-03 DIAGNOSIS — M1990 Unspecified osteoarthritis, unspecified site: Secondary | ICD-10-CM | POA: Diagnosis present

## 2022-07-03 DIAGNOSIS — Z923 Personal history of irradiation: Secondary | ICD-10-CM

## 2022-07-03 DIAGNOSIS — R079 Chest pain, unspecified: Secondary | ICD-10-CM | POA: Diagnosis not present

## 2022-07-03 DIAGNOSIS — Z7952 Long term (current) use of systemic steroids: Secondary | ICD-10-CM | POA: Diagnosis not present

## 2022-07-03 DIAGNOSIS — I5A Non-ischemic myocardial injury (non-traumatic): Secondary | ICD-10-CM | POA: Diagnosis present

## 2022-07-03 DIAGNOSIS — R0902 Hypoxemia: Secondary | ICD-10-CM | POA: Diagnosis not present

## 2022-07-03 DIAGNOSIS — Z825 Family history of asthma and other chronic lower respiratory diseases: Secondary | ICD-10-CM

## 2022-07-03 DIAGNOSIS — M1612 Unilateral primary osteoarthritis, left hip: Secondary | ICD-10-CM | POA: Diagnosis not present

## 2022-07-03 DIAGNOSIS — S79922A Unspecified injury of left thigh, initial encounter: Secondary | ICD-10-CM | POA: Diagnosis not present

## 2022-07-03 DIAGNOSIS — N1831 Chronic kidney disease, stage 3a: Secondary | ICD-10-CM | POA: Diagnosis present

## 2022-07-03 DIAGNOSIS — Z6827 Body mass index (BMI) 27.0-27.9, adult: Secondary | ICD-10-CM

## 2022-07-03 DIAGNOSIS — S79912A Unspecified injury of left hip, initial encounter: Secondary | ICD-10-CM | POA: Diagnosis not present

## 2022-07-03 DIAGNOSIS — Z91048 Other nonmedicinal substance allergy status: Secondary | ICD-10-CM

## 2022-07-03 DIAGNOSIS — Z888 Allergy status to other drugs, medicaments and biological substances status: Secondary | ICD-10-CM

## 2022-07-03 DIAGNOSIS — N390 Urinary tract infection, site not specified: Secondary | ICD-10-CM | POA: Diagnosis present

## 2022-07-03 DIAGNOSIS — I714 Abdominal aortic aneurysm, without rupture, unspecified: Secondary | ICD-10-CM | POA: Diagnosis present

## 2022-07-03 DIAGNOSIS — R42 Dizziness and giddiness: Secondary | ICD-10-CM

## 2022-07-03 DIAGNOSIS — Z8249 Family history of ischemic heart disease and other diseases of the circulatory system: Secondary | ICD-10-CM

## 2022-07-03 DIAGNOSIS — I5023 Acute on chronic systolic (congestive) heart failure: Secondary | ICD-10-CM | POA: Diagnosis present

## 2022-07-03 DIAGNOSIS — E039 Hypothyroidism, unspecified: Secondary | ICD-10-CM | POA: Diagnosis present

## 2022-07-03 DIAGNOSIS — M353 Polymyalgia rheumatica: Secondary | ICD-10-CM | POA: Diagnosis present

## 2022-07-03 DIAGNOSIS — Z885 Allergy status to narcotic agent status: Secondary | ICD-10-CM

## 2022-07-03 DIAGNOSIS — R000 Tachycardia, unspecified: Secondary | ICD-10-CM | POA: Diagnosis not present

## 2022-07-03 DIAGNOSIS — D6489 Other specified anemias: Secondary | ICD-10-CM | POA: Diagnosis present

## 2022-07-03 DIAGNOSIS — M25552 Pain in left hip: Secondary | ICD-10-CM | POA: Diagnosis present

## 2022-07-03 DIAGNOSIS — M25519 Pain in unspecified shoulder: Secondary | ICD-10-CM | POA: Diagnosis not present

## 2022-07-03 DIAGNOSIS — I10 Essential (primary) hypertension: Secondary | ICD-10-CM | POA: Diagnosis present

## 2022-07-03 DIAGNOSIS — Z23 Encounter for immunization: Secondary | ICD-10-CM

## 2022-07-03 DIAGNOSIS — M47816 Spondylosis without myelopathy or radiculopathy, lumbar region: Secondary | ICD-10-CM | POA: Diagnosis not present

## 2022-07-03 LAB — TROPONIN I (HIGH SENSITIVITY)
Troponin I (High Sensitivity): 21 ng/L — ABNORMAL HIGH (ref <18)
Troponin I (High Sensitivity): 20 ng/L — ABNORMAL HIGH (ref <18)

## 2022-07-03 LAB — BASIC METABOLIC PANEL
Anion gap: 7 (ref 5–15)
BUN: 20 mg/dL (ref 8–23)
CO2: 27 mmol/L (ref 22–32)
Calcium: 8.4 mg/dL — ABNORMAL LOW (ref 8.9–10.3)
Chloride: 105 mmol/L (ref 98–111)
Creatinine, Ser: 1.43 mg/dL — ABNORMAL HIGH (ref 0.44–1.00)
GFR, Estimated: 34 mL/min — ABNORMAL LOW (ref >60)
Glucose, Bld: 88 mg/dL (ref 70–99)
Potassium: 3.9 mmol/L (ref 3.5–5.1)
Sodium: 139 mmol/L (ref 135–145)

## 2022-07-03 LAB — TSH: TSH: 6.219 u[IU]/mL — ABNORMAL HIGH (ref 0.350–4.500)

## 2022-07-03 LAB — URINALYSIS, ROUTINE W REFLEX MICROSCOPIC
Bilirubin Urine: NEGATIVE
Glucose, UA: NEGATIVE mg/dL
Hgb urine dipstick: NEGATIVE
Ketones, ur: NEGATIVE mg/dL
Nitrite: NEGATIVE
Protein, ur: NEGATIVE mg/dL
Specific Gravity, Urine: 1.006 (ref 1.005–1.030)
WBC, UA: >50 WBC/hpf — ABNORMAL HIGH (ref 0–5)
pH: 5 (ref 5.0–8.0)

## 2022-07-03 LAB — PHOSPHORUS: Phosphorus: 3.3 mg/dL (ref 2.5–4.6)

## 2022-07-03 LAB — LACTIC ACID, PLASMA: Lactic Acid, Venous: 1.1 mmol/L (ref 0.5–1.9)

## 2022-07-03 LAB — LIPID PANEL
Cholesterol: 117 mg/dL (ref 0–200)
HDL: 38 mg/dL — ABNORMAL LOW (ref >40)
LDL Cholesterol: 63 mg/dL (ref 0–99)
Total CHOL/HDL Ratio: 3.1 RATIO
Triglycerides: 82 mg/dL (ref <150)
VLDL: 16 mg/dL (ref 0–40)

## 2022-07-03 LAB — CBC
HCT: 29.9 % — ABNORMAL LOW (ref 36.0–46.0)
Hemoglobin: 8.9 g/dL — ABNORMAL LOW (ref 12.0–15.0)
MCH: 26.1 pg (ref 26.0–34.0)
MCHC: 29.8 g/dL — ABNORMAL LOW (ref 30.0–36.0)
MCV: 87.7 fL (ref 80.0–100.0)
Platelets: 250 10*3/uL (ref 150–400)
RBC: 3.41 MIL/uL — ABNORMAL LOW (ref 3.87–5.11)
RDW: 15.7 % — ABNORMAL HIGH (ref 11.5–15.5)
WBC: 8.3 10*3/uL (ref 4.0–10.5)
nRBC: 0 % (ref 0.0–0.2)

## 2022-07-03 LAB — MAGNESIUM: Magnesium: 1.3 mg/dL — ABNORMAL LOW (ref 1.7–2.4)

## 2022-07-03 LAB — FOLATE: Folate: >40 ng/mL (ref >5.9)

## 2022-07-03 LAB — IRON AND TIBC
Iron: 26 ug/dL — ABNORMAL LOW (ref 28–170)
Saturation Ratios: 9 % — ABNORMAL LOW (ref 10.4–31.8)
TIBC: 300 ug/dL (ref 250–450)
UIBC: 274 ug/dL

## 2022-07-03 LAB — HEMOGLOBIN A1C
Hgb A1c MFr Bld: 5.4 % (ref 4.8–5.6)
Mean Plasma Glucose: 108.28 mg/dL

## 2022-07-03 LAB — FERRITIN: Ferritin: 16 ng/mL (ref 11–307)

## 2022-07-03 LAB — BRAIN NATRIURETIC PEPTIDE: B Natriuretic Peptide: 531.2 pg/mL — ABNORMAL HIGH (ref 0.0–100.0)

## 2022-07-03 LAB — PROCALCITONIN: Procalcitonin: <0.1 ng/mL

## 2022-07-03 MED ORDER — PREDNISONE 10 MG PO TABS
10.0000 mg | ORAL_TABLET | Freq: Every day | ORAL | Status: DC
  Administered 2022-07-04 – 2022-07-06 (×3): 10 mg via ORAL
  Filled 2022-07-03 (×3): qty 1

## 2022-07-03 MED ORDER — SODIUM CHLORIDE 0.9 % IV SOLN
2.0000 g | INTRAVENOUS | Status: DC
  Administered 2022-07-04 – 2022-07-05 (×2): 2 g via INTRAVENOUS
  Filled 2022-07-03: qty 20
  Filled 2022-07-03: qty 2

## 2022-07-03 MED ORDER — FUROSEMIDE 10 MG/ML IJ SOLN
20.0000 mg | Freq: Two times a day (BID) | INTRAMUSCULAR | Status: DC
  Administered 2022-07-03 – 2022-07-05 (×4): 20 mg via INTRAVENOUS
  Filled 2022-07-03: qty 2
  Filled 2022-07-03: qty 4
  Filled 2022-07-03: qty 2
  Filled 2022-07-03: qty 4

## 2022-07-03 MED ORDER — METHYLPREDNISOLONE SODIUM SUCC 40 MG IJ SOLR
40.0000 mg | Freq: Once | INTRAMUSCULAR | Status: AC
  Administered 2022-07-03: 40 mg via INTRAVENOUS
  Filled 2022-07-03: qty 1

## 2022-07-03 MED ORDER — LIDOCAINE 5 % EX PTCH: 1.0000 | MEDICATED_PATCH | Freq: Every day | CUTANEOUS | Status: DC | PRN

## 2022-07-03 MED ORDER — POTASSIUM CHLORIDE 20 MEQ PO PACK
40.0000 meq | PACK | Freq: Once | ORAL | Status: AC
  Administered 2022-07-03: 40 meq via ORAL
  Filled 2022-07-03: qty 2

## 2022-07-03 MED ORDER — DILTIAZEM HCL-DEXTROSE 125-5 MG/125ML-% IV SOLN (PREMIX)
5.0000 mg/h | INTRAVENOUS | Status: DC
  Administered 2022-07-03 – 2022-07-04 (×2): 5 mg/h via INTRAVENOUS
  Filled 2022-07-03 (×2): qty 125

## 2022-07-03 MED ORDER — PREDNISONE 10 MG PO TABS: 5.0000 mg | ORAL_TABLET | Freq: Every day | ORAL | Status: DC

## 2022-07-03 MED ORDER — HYDROXYCHLOROQUINE SULFATE 200 MG PO TABS
200.0000 mg | ORAL_TABLET | Freq: Two times a day (BID) | ORAL | Status: DC
  Administered 2022-07-03 – 2022-07-06 (×6): 200 mg via ORAL
  Filled 2022-07-03 (×6): qty 1

## 2022-07-03 MED ORDER — ACETAMINOPHEN 325 MG PO TABS
650.0000 mg | ORAL_TABLET | Freq: Four times a day (QID) | ORAL | Status: DC | PRN
  Administered 2022-07-04: 650 mg via ORAL
  Filled 2022-07-03: qty 2

## 2022-07-03 MED ORDER — GABAPENTIN 100 MG PO CAPS
100.0000 mg | ORAL_CAPSULE | Freq: Every day | ORAL | Status: DC
  Administered 2022-07-04 – 2022-07-05 (×2): 100 mg via ORAL
  Filled 2022-07-03 (×2): qty 1

## 2022-07-03 MED ORDER — POTASSIUM CHLORIDE CRYS ER 20 MEQ PO TBCR
40.0000 meq | EXTENDED_RELEASE_TABLET | Freq: Once | ORAL | Status: DC
  Filled 2022-07-03: qty 2

## 2022-07-03 MED ORDER — SODIUM CHLORIDE 0.9 % IV SOLN
2.0000 g | Freq: Once | INTRAVENOUS | Status: AC
  Administered 2022-07-03: 2 g via INTRAVENOUS
  Filled 2022-07-03: qty 20

## 2022-07-03 MED ORDER — HEPARIN SODIUM (PORCINE) 5000 UNIT/ML IJ SOLN
5000.0000 [IU] | Freq: Three times a day (TID) | INTRAMUSCULAR | Status: DC
  Administered 2022-07-03 – 2022-07-04 (×3): 5000 [IU] via SUBCUTANEOUS
  Filled 2022-07-03 (×3): qty 1

## 2022-07-03 MED ORDER — HYDROCODONE-ACETAMINOPHEN 5-325 MG PO TABS: 0.5000 | ORAL_TABLET | Freq: Three times a day (TID) | ORAL | Status: DC | PRN

## 2022-07-03 MED ORDER — DILTIAZEM HCL 25 MG/5ML IV SOLN
10.0000 mg | Freq: Once | INTRAVENOUS | Status: AC
Start: 2022-07-03 — End: 2022-07-03
  Administered 2022-07-03: 10 mg via INTRAVENOUS
  Filled 2022-07-03: qty 5

## 2022-07-03 MED ORDER — ALBUTEROL SULFATE (2.5 MG/3ML) 0.083% IN NEBU: 3.0000 mL | INHALATION_SOLUTION | RESPIRATORY_TRACT | Status: DC | PRN

## 2022-07-03 MED ORDER — ONDANSETRON HCL 4 MG/2ML IJ SOLN
4.0000 mg | Freq: Three times a day (TID) | INTRAMUSCULAR | Status: DC | PRN
  Administered 2022-07-03: 4 mg via INTRAVENOUS
  Filled 2022-07-03: qty 2

## 2022-07-03 MED ORDER — PANTOPRAZOLE SODIUM 40 MG PO TBEC
40.0000 mg | DELAYED_RELEASE_TABLET | Freq: Two times a day (BID) | ORAL | Status: DC
  Administered 2022-07-03 – 2022-07-06 (×6): 40 mg via ORAL
  Filled 2022-07-03 (×6): qty 1

## 2022-07-03 MED ORDER — DILTIAZEM HCL-DEXTROSE 125-5 MG/125ML-% IV SOLN (PREMIX): 5.0000 mg/h | INTRAVENOUS | Status: DC

## 2022-07-03 MED ORDER — GABAPENTIN 100 MG PO CAPS
200.0000 mg | ORAL_CAPSULE | Freq: Two times a day (BID) | ORAL | Status: DC
  Administered 2022-07-03 – 2022-07-06 (×6): 200 mg via ORAL
  Filled 2022-07-03 (×6): qty 2

## 2022-07-03 MED ORDER — MAGNESIUM SULFATE 2 GM/50ML IV SOLN
2.0000 g | Freq: Once | INTRAVENOUS | Status: AC
  Administered 2022-07-03: 2 g via INTRAVENOUS
  Filled 2022-07-03: qty 50

## 2022-07-03 MED ORDER — DM-GUAIFENESIN ER 30-600 MG PO TB12: 1.0000 | ORAL_TABLET | Freq: Two times a day (BID) | ORAL | Status: DC | PRN

## 2022-07-03 MED ORDER — HYDRALAZINE HCL 20 MG/ML IJ SOLN: 5.0000 mg | INTRAMUSCULAR | Status: DC | PRN

## 2022-07-03 NOTE — ED Provider Notes (Signed)
[facility] Provider Note    Event Date/Time   First MD Initiated Contact with Patient 07/03/22 1307     (approximate)   History   Dizziness   HPI  Stacy [name] is a 88 y.o. female here with multiple complaints.  The patient's primary complaint is intermittent weakness, dizziness, and fatigue.  The patient also had some mild chest pressure intermittently.  The patient has reportedly been increasingly weak over the last several days and has gone from occasionally being able to scoot herself around the house to essentially immobile.  She states that she has felt very lightheaded intermittently.  She has felt some dizziness which she describes more as a lightheadedness.  No focal numbness or weakness.  Denies any known medication changes.  Denies any fevers or chills.     Physical Exam   Triage Vital Signs: ED Triage Vitals  Enc Vitals Group     BP 07/03/22 1248 (!) 149/119     Pulse Rate 07/03/22 1248 (!) 114     Resp 07/03/22 1248 18     Temp 07/03/22 1248 98.2 F (36.8 C)     Temp src --      SpO2 07/03/22 1248 96 %     Weight 07/03/22 1246 160 lb (72.6 kg)     Height 07/03/22 1246 '5\' 4"'$  (1.626 m)     Head Circumference --      Peak Flow --      Pain Score 07/03/22 1246 5     Pain Loc --      Pain Edu? --      Excl. in [place]? --     Most recent vital signs: Vitals:   07/03/22 1418 07/03/22 1430  BP: 107/74 116/81  Pulse: (!) 106 (!) 104  Resp: 20 16  Temp:    SpO2: 99% 99%     General: Awake, no distress.  CV:  Good peripheral perfusion.  Tachycardic, irregularly irregular. Resp:  Normal effort.  Bibasilar rales noted. Abd:  No distention.  Mild suprapubic tenderness. Other:  2+ edema bilateral lower extremities, mildly worse on the right.   ED Results / Procedures / Treatments   Labs (all labs ordered are listed, but only abnormal results are displayed) Labs Reviewed  BASIC METABOLIC PANEL - Abnormal; Notable for the following  components:      Result Value   Creatinine, Ser 1.43 (*)    Calcium 8.4 (*)    GFR, Estimated 34 (*)    All other components within normal limits  CBC - Abnormal; Notable for the following components:   RBC 3.41 (*)    Hemoglobin 8.9 (*)    HCT 29.9 (*)    MCHC 29.8 (*)    RDW 15.7 (*)    All other components within normal limits  URINALYSIS, ROUTINE W REFLEX MICROSCOPIC - Abnormal; Notable for the following components:   Color, Urine YELLOW (*)    APPearance HAZY (*)    Leukocytes,Ua LARGE (*)    WBC, UA >50 (*)    Bacteria, UA MANY (*)    All other components within normal limits  MAGNESIUM - Abnormal; Notable for the following components:   Magnesium 1.3 (*)    All other components within normal limits  BRAIN NATRIURETIC PEPTIDE - Abnormal; Notable for the following components:   B Natriuretic Peptide 531.2 (*)    All other components within normal limits  LIPID PANEL - Abnormal; Notable for the following components:  HDL 38 (*)    All other components within normal limits  TROPONIN I (HIGH SENSITIVITY) - Abnormal; Notable for the following components:   Troponin I (High Sensitivity) 21 (*)    All other components within normal limits  TROPONIN I (HIGH SENSITIVITY) - Abnormal; Notable for the following components:   Troponin I (High Sensitivity) 20 (*)    All other components within normal limits  URINE CULTURE  PHOSPHORUS  HEMOGLOBIN A1C  LACTIC ACID, PLASMA  PROCALCITONIN  LACTIC ACID, PLASMA     EKG Atrial fibrillation, reticulate 113.  QRS 93, QTc 464.  No acute ST elevations or depressions.  No acute events of acute ischemia or infarct.   RADIOLOGY Chest x-ray: No active process CT head: No acute intracranial abnormality   I also independently reviewed and agree with radiologist interpretations.   PROCEDURES:  Critical Care performed: Yes, see critical care procedure note(s)  .Critical Care  Performed by: Duffy Bruce, MD Authorized by:  Duffy Bruce, MD   Critical care provider statement:    Critical care time (minutes):  30   Critical care time was exclusive of:  Separately billable procedures and treating other patients   Critical care was necessary to treat or prevent imminent or life-threatening deterioration of the following conditions:  Cardiac failure, circulatory failure and respiratory failure   Critical care was time spent personally by me on the following activities:  Development of treatment plan with patient or surrogate, discussions with consultants, evaluation of patient's response to treatment, examination of patient, ordering and review of laboratory studies, ordering and review of radiographic studies, ordering and performing treatments and interventions, pulse oximetry, re-evaluation of patient's condition and review of old [place] [facility]: Medications  cefTRIAXone (ROCEPHIN) 2 g in sodium chloride 0.9 % 100 mL IVPB (2 g Intravenous New Bag/Given 07/03/22 1539)  albuterol (PROVENTIL) (2.5 MG/3ML) 0.083% nebulizer solution 3 mL (has no administration in time range)  dextromethorphan-guaiFENesin (MUCINEX DM) 30-600 MG per 12 hr tablet 1 tablet (has no administration in time range)  ondansetron (ZOFRAN) injection 4 mg (has no administration in time range)  acetaminophen (TYLENOL) tablet 650 mg (has no administration in time range)  hydrALAZINE (APRESOLINE) injection 5 mg (has no administration in time range)  cefTRIAXone (ROCEPHIN) 2 g in sodium chloride 0.9 % 100 mL IVPB (has no administration in time range)  heparin injection 5,000 Units (5,000 Units Subcutaneous Given 07/03/22 1543)  diltiazem (CARDIZEM) injection 10 mg (10 mg Intravenous Given 07/03/22 1429)  magnesium sulfate IVPB 2 g 50 mL (0 g Intravenous Stopped 07/03/22 1522)  potassium chloride (KLOR-CON) packet 40 mEq (40 mEq Oral Given 07/03/22 1529)     IMPRESSION / MDM / [place] / ED COURSE  I reviewed the  triage vital signs and the nursing notes.                               The patient is on the cardiac monitor to evaluate for evidence of arrhythmia and/or significant heart rate changes.   Ddx:  Differential includes the following, with pertinent life- or limb-threatening emergencies considered:  Generalized weakness secondary to occult infection such as UTI or pneumonia, CHF, new onset A-fib RVR, ACS, PE, electrolyte abnormality, anemia  Patient's presentation is most consistent with acute presentation with potential threat to life or bodily function.  MDM:  88 year old female with past medical history of hypertension, cancer,  polymyalgia, here with generalized weakness.  Patient noted to be and what appears to be new onset atrial fibrillation with rapid ventricular response.  She also is hypervolemic with bibasilar rales and elevated BNP consistent with CHF.  She reportedly had been on Lasix before although I do not see mention of CHF in her notes.  Chest x-ray is clear.  CT head shows no acute normality.  Electrolytes reviewed, show hypomagnesemia which be repleted.  Troponin 21 which is likely demand related.  She has no current chest discomfort and EKG does not show ACS.  Creatinine shows possible mild acute on chronic kidney injury.  Urinalysis also concerning for UTI for which we will start Rocephin and send culture.  Plan to admit to hospitalist service   [place] ED: Medications  cefTRIAXone (ROCEPHIN) 2 g in sodium chloride 0.9 % 100 mL IVPB (2 g Intravenous New Bag/Given 07/03/22 1539)  albuterol (PROVENTIL) (2.5 MG/3ML) 0.083% nebulizer solution 3 mL (has no administration in time range)  dextromethorphan-guaiFENesin (MUCINEX DM) 30-600 MG per 12 hr tablet 1 tablet (has no administration in time range)  ondansetron (ZOFRAN) injection 4 mg (has no administration in time range)  acetaminophen (TYLENOL) tablet 650 mg (has no administration in time range)  hydrALAZINE  (APRESOLINE) injection 5 mg (has no administration in time range)  cefTRIAXone (ROCEPHIN) 2 g in sodium chloride 0.9 % 100 mL IVPB (has no administration in time range)  heparin injection 5,000 Units (5,000 Units Subcutaneous Given 07/03/22 1543)  diltiazem (CARDIZEM) injection 10 mg (10 mg Intravenous Given 07/03/22 1429)  magnesium sulfate IVPB 2 g 50 mL (0 g Intravenous Stopped 07/03/22 1522)  potassium chloride (KLOR-CON) packet 40 mEq (40 mEq Oral Given 07/03/22 1529)     Consults:  Hospitalist consulted for admission   EMR reviewed  Prior ED visits.     FINAL CLINICAL IMPRESSION(S) / ED DIAGNOSES   Final diagnoses:  New onset atrial fibrillation ([place])  Acute cystitis without hematuria     Rx / DC Orders   ED Discharge Orders     None        Note:  This document was prepared using Dragon voice recognition software and may include unintentional dictation errors.   Duffy Bruce, MD 07/03/22 [phone]

## 2022-07-03 NOTE — ED Notes (Signed)
Pt able to use bedpan at this time.

## 2022-07-03 NOTE — Assessment & Plan Note (Signed)
Hemoglobin 8.9 (9.7 on 6/9/202 2), slightly dropped Anemia panel shows mild iron deficiency, normal B12 and folate

## 2022-07-03 NOTE — Assessment & Plan Note (Signed)
Follow up with PCP

## 2022-07-03 NOTE — ED Notes (Signed)
Pt off bedpan at this time. 1 bowel movement at this time. Pt placed on purewick and cleaned and dried.

## 2022-07-03 NOTE — Assessment & Plan Note (Signed)
Not on medications.   Lipid profile: total chol 117, LDL 63, HDL low 38, TG's 82 --Mgmt per cardiology

## 2022-07-03 NOTE — ED Triage Notes (Signed)
Pt via EMS from home. Pt c/o dizziness for the past 2 days. Pt also c/o chest pain on the L side. EMS reports HR of 120-190 a fib with no hx of a fib. States she does have some increase swelling in feet for the last 2 days. Pt has hx of CHF. EMS gave '10mg'$  of Cardizem and 561m of NS. Pt is A&OX4 and NAD.

## 2022-07-03 NOTE — Assessment & Plan Note (Signed)
Magnesium 1.3 -Repleted magnesium -Continue 40 mg potassium chloride (potassium is 3.9) -Check phosphorus level

## 2022-07-03 NOTE — Assessment & Plan Note (Signed)
Most likely due to atrial fibrillation with RVR, heart rate up to 190. CT-hjead negative. -Fall  precaution -PT/OT

## 2022-07-03 NOTE — Assessment & Plan Note (Signed)
Presented with new onset atrial fibrillation with heart rate 120-190. CHA2DS2-VASc Score is 5.  Ideally patient will need anticoagulants chronically.  Per her daughter, patient is very easy to bleed, particularly when patient was taking aspirin. Pt is also high falls risk. --defer anticoagulation for now, pending risk/benefit discussions --cardiology consulted, Dr. Humphrey Rolls --On Cardizem drip --Telemetry

## 2022-07-03 NOTE — Assessment & Plan Note (Addendum)
Presented with SOB, leg edema, positive JVD, elevated BNP 531, clinically consistent with CHF decompensation, likely driven by A-fib with RVR. Echo 10/9 shows EF 45-50% --Cardiology following, see their recommendations --Started on IV Lasix 20 mg BID on admission --Stop IV Lasix today, Cr increased --Hold diuresis for today --Monitor I/O's daily weights --Montior & replace electrolytes --Mgmt per cardiology

## 2022-07-03 NOTE — Assessment & Plan Note (Signed)
-   IV hydralazine as needed -Patient is on IV Lasix

## 2022-07-03 NOTE — Assessment & Plan Note (Signed)
Patient is not taking medications currently. TSH mildly elevated 6.219, but in setting of acute illness. --defer to primary care

## 2022-07-03 NOTE — H&P (Signed)
History and Physical    Stacy [name] UXN:[id number] DOB: [birth year] DOA: 07/03/2022  Referring MD/NP/PA:   PCP: Jonetta Osgood, NP   Patient coming from:  The patient is coming from home.  At baseline, pt is independent for most of ADL.        Chief Complaint: dizziness, chest pain  HPI: Stacy [name] is a 88 y.o. female with medical history significant of CHF (listed on medical problem list), HTN, HLD, COPD, hypothyroidism, polymyalgia rheumatica, breast cancer (s/p of radiation therapy), CKD stage IIIa, colitis, AAA, obesity with a BMI 27.46, who presents with dizziness and chest pain.  Patient is a very poor historian, I called her daughter by phone, who provided some information's.  History is limited.  It seems that the patient has weakness, lightheadedness and dizziness for several days.  Her daughter states that patient fell last week.  She moves all extremities, no facial droop or slurred speech.  Patient states that she has left-sided chest pain, dry cough and some shortness of breath.  She cannot give detailed information about her chest pain.  She denies symptoms of UTI.  Does not have active nausea, vomiting, diarrhea or abdominal pain.  She complains of left hip pain and left upper leg pain. Daughter states that patient is very easy to bleed particularly when she was taking aspirin.  Patient was found to have a new onset atrial fibrillation, with heart rate 120-190s per report.  Cardizem drip is started in ED.  Data reviewed independently and ED Course: pt was found to have troponin 21, BNP 531, positive urinalysis for UTI (hazy appearance, large amount of leukocyte, many bacteria, WBC> 50), slightly worsening renal function, magnesium 1.3, potassium 3.9, temperature normal, blood pressure 116/81, RR 20, 16, oxygen saturation 96% on room air.  Chest x-ray negative.  CT of head negative.  Patient is admitted to PCU as inpatient. Card is consulted, Dr. Humphrey Rolls.   EKG: I have personally  reviewed.  A-fib, QTc 464, PVC, Q waves in lead III, ST depression in V5-V6.   Review of Systems:   General: no fevers, chills, no body weight gain, has fatigue HEENT: no blurry vision, hearing changes or sore throat Respiratory: has dyspnea, coughing, no wheezing CV: has chest pain, no palpitations GI: no nausea, vomiting, abdominal pain, diarrhea, constipation GU: no dysuria, burning on urination, increased urinary frequency, hematuria  Ext: has leg edema Neuro: no unilateral weakness, numbness, or tingling, no vision change or hearing loss. Has dizziness Skin: no rash, no skin tear. MSK: No muscle spasm, no deformity, no limitation of range of movement in spin. Has left hip and upper leg pain Heme: No easy bruising.  Travel history: No recent long distant travel.   Allergy:  Allergies  Allergen Reactions   Aspirin Itching and Other (See Comments)    Dizzy/passing out   Augmentin [Amoxicillin-Pot Clavulanate]    Codeine Other (See Comments)    dizziness   Doxycycline Hyclate Other (See Comments)   Sulfamethoxazole-Trimethoprim Diarrhea   Tape Itching   Vitamin D Analogs    Azithromycin Rash and Hives    Past Medical History:  Diagnosis Date   Arthritis 1940   Asthma    Bowel trouble    Cancer ([place]) 2006   DCIS left breast   Hypertension 1940   Incisional hernia 2014   Personal history of radiation therapy    Polymyalgia ([place])    Thyroid disorder    Vitiligo 2012    Past Surgical History:  Procedure Laterality Date   BREAST BIOPSY Left 2006   +   BREAST SURGERY Left 2006   lumpectomy   [place]   COLON SURGERY  2004   COLONOSCOPY  2014   Dr. Candace Cruise   EYE SURGERY  2002   FOOT SURGERY  1996   HERNIA REPAIR  2013   left breast biopsy   2011    Social History:  reports that she has never smoked. She has never used smokeless tobacco. She reports that she does not drink alcohol and does not use drugs.  Family History:  Family History  Problem  Relation Age of Onset   Heart disease Mother    Emphysema Mother    Arthritis Father    Cancer Father    Breast cancer Neg Hx      Prior to Admission medications   Medication Sig Start Date End Date Taking? Authorizing Provider  acetaminophen (TYLENOL) 325 MG tablet Take 650 mg by mouth every 6 (six) hours as needed for pain.    [provider]  acidophilus (RISAQUAD) CAPS capsule Take 2 capsules by mouth daily. Patient not taking: Reported on 06/16/2022 11/10/21   [name], MD  diclofenac Sodium (VOLTAREN) 1 % GEL Apply 2 g topically 4 (four) times daily. Patient not taking: Reported on 06/16/2022 11/12/21   [name], MD  gabapentin (NEURONTIN) 100 MG capsule Take 2 tabs 3 x day for neuropathy by mouth 11/10/21   [name], MD  HYDROcodone-acetaminophen [facility]) 5-325 MG tablet Take 0.5-1 tablets by mouth 2 (two) times daily as needed for moderate pain or severe pain. 06/02/22   Jonetta Osgood, NP  hydroxychloroquine (PLAQUENIL) 200 MG tablet Take 1 tablet by mouth twice a day    send to [facility] 05/26/22   McDonough, Lauren K, PA-C  lidocaine 4 % Place 1 patch onto the skin daily. As needed for pain    [provider]  Multiple Vitamin (MULTIVITAMIN WITH MINERALS) TABS tablet Take 1 tablet by mouth daily. 03/17/21   Geradine Girt, DO  pantoprazole (PROTONIX) 40 MG tablet TAKE 1 TABLET TWICE DAILY 06/05/22   Jonetta Osgood, NP  predniSONE (DELTASONE) 10 MG tablet Take 1 tablet (10 mg total) by mouth daily with breakfast. 05/16/22   [name], MD  traMADol Veatrice Bourbon) 50 MG tablet Take one tab by mouth with supper Patient not taking: Reported on 06/16/2022 04/18/22   Mylinda Latina, PA-C    Physical Exam: Vitals:   07/04/22 0400 07/04/22 0415 07/04/22 0430 07/04/22 0500  BP: 96/79 96/62 112/72 94/62  Pulse: (!) 105 100 (!) 103 94  Resp: '18  18 18  '$ Temp:      TempSrc:      SpO2: 97% 94% 95% 96%  Weight:      Height:       General: Not in acute  distress HEENT:       Eyes: PERRL, EOMI, no scleral icterus.       ENT: No discharge from the ears and nose, no pharynx injection, no tonsillar enlargement.        Neck: has positive JVD, no bruit, no mass felt. Heme: No neck lymph node enlargement. Cardiac: S1/S2, irregularly irregular rhythm, no gallops or rubs. Respiratory: No rales, wheezing, rhonchi or rubs. GI: Soft, nondistended, nontender, no rebound pain, no organomegaly, BS present. GU: No hematuria Ext: 1+ pitting leg edema bilaterally. 1+DP/PT pulse bilaterally. Musculoskeletal: No joint deformities, No joint redness or warmth,  no limitation of ROM in spin. Skin: No rashes.  Neuro: Alert, cranial nerves II-XII grossly intact, moves all extremities   Psych: Patient is not psychotic, no suicidal or hemocidal ideation.  Labs on Admission: I have personally reviewed following labs and imaging studies  CBC: Recent Labs  Lab 07/03/22 1245  WBC 8.3  HGB 8.9*  HCT 29.9*  MCV 87.7  PLT 675   Basic Metabolic Panel: Recent Labs  Lab 07/03/22 1245 07/03/22 1456 07/04/22 0557  NA 139  --  139  K 3.9  --  5.2*  CL 105  --  107  CO2 27  --  23  GLUCOSE 88  --  138*  BUN 20  --  21  CREATININE 1.43*  --  1.48*  CALCIUM 8.4*  --  8.7*  MG 1.3*  --  1.8  PHOS  --  3.3  --    GFR: Estimated Creatinine Clearance: 23.2 mL/min (A) (by C-G formula based on SCr of 1.48 mg/dL (H)). Liver Function Tests: No results for input(s): "AST", "ALT", "ALKPHOS", "BILITOT", "PROT", "ALBUMIN" in the last 168 hours. No results for input(s): "LIPASE", "AMYLASE" in the last 168 hours. No results for input(s): "AMMONIA" in the last 168 hours. Coagulation Profile: No results for input(s): "INR", "PROTIME" in the last 168 hours. Cardiac Enzymes: No results for input(s): "CKTOTAL", "CKMB", "CKMBINDEX", "TROPONINI" in the last 168 hours. BNP (last 3 results) No results for input(s): "PROBNP" in the last 8760 hours. HbA1C: Recent Labs     07/03/22 1539  HGBA1C 5.4   CBG: No results for input(s): "GLUCAP" in the last 168 hours. Lipid Profile: Recent Labs    07/03/22 1456  CHOL 117  HDL 38*  LDLCALC 63  TRIG 82  CHOLHDL 3.1   Thyroid Function Tests: Recent Labs    07/03/22 1456  TSH 6.219*   Anemia Panel: Recent Labs    07/03/22 1456 07/03/22 2330  FOLATE >40.0  --   FERRITIN 16  --   TIBC 300  --   IRON 26*  --   RETICCTPCT  --  1.5   Urine analysis:    Component Value Date/Time   COLORURINE YELLOW (A) 07/03/2022 1428   APPEARANCEUR HAZY (A) 07/03/2022 1428   APPEARANCEUR Clear 07/05/2018 1446   LABSPEC 1.006 07/03/2022 1428   LABSPEC 1.036 04/30/2012 1618   PHURINE 5.0 07/03/2022 1428   GLUCOSEU NEGATIVE 07/03/2022 1428   GLUCOSEU Negative 04/30/2012 1618   HGBUR NEGATIVE 07/03/2022 1428   BILIRUBINUR NEGATIVE 07/03/2022 1428   BILIRUBINUR Negative 07/05/2018 1446   BILIRUBINUR Negative 04/30/2012 1618   KETONESUR NEGATIVE 07/03/2022 1428   PROTEINUR NEGATIVE 07/03/2022 1428   NITRITE NEGATIVE 07/03/2022 1428   LEUKOCYTESUR LARGE (A) 07/03/2022 1428   LEUKOCYTESUR 2+ 04/30/2012 1618   Sepsis Labs: '@LABRCNTIP'$ (procalcitonin:4,lacticidven:4) )No results found for this or any previous visit (from the past 240 hour(s)).   Radiological Exams on Admission: DG FEMUR MIN 2 VIEWS LEFT  Result Date: 07/03/2022 CLINICAL DATA:  Injury EXAM: LEFT FEMUR 2 VIEWS COMPARISON:  None FINDINGS: Osseous demineralization. Knee joint alignment normal which joint space narrowing seen. Collapse of LEFT femoral head suspect underlying avascular necrosis Secondary degenerative changes of the LEFT hip joint. No fracture or dislocation. IMPRESSION: Osseous demineralization with probable avascular necrosis changes of LEFT femoral head with partial collapse and secondary degenerative changes at the LEFT hip joint. No acute abnormalities. Electronically Signed   By: Lavonia Dana M.D.   On: 07/03/2022 19:49  DG HIP UNILAT  WITH PELVIS 2-3 VIEWS LEFT  Result Date: 07/03/2022 CLINICAL DATA:  LEFT hip injury EXAM: DG HIP (WITH OR WITHOUT PELVIS) 2-3V LEFT COMPARISON:  None FINDINGS: Osseous demineralization. Flattening of the femoral head with mixed sclerosis and lucency likely reflecting avascular necrosis and partial collapse. Secondary degenerative changes of LEFT hip joint. No acute fracture, dislocation, or bone destruction. Narrowing of RIGHT hip joint space. Degenerative facet disease changes lower lumbar spine. Foci of soft tissue gas extending into the inferior RIGHT pelvis/RIGHT inguinal region question RIGHT inguinal versus ventral hernia. IMPRESSION: Suspected avascular necrosis of the LEFT femoral head with partial collapse and advanced secondary degenerative changes. No acute fracture or dislocation. Question RIGHT inguinal or ventral hernia; recommend correlation with physical exam. Electronically Signed   By: Lavonia Dana M.D.   On: 07/03/2022 19:48   CT HEAD WO CONTRAST (5MM)  Result Date: 07/03/2022 CLINICAL DATA:  Head trauma, moderate to severe. Dizziness over the last 2 days. EXAM: CT HEAD WITHOUT CONTRAST TECHNIQUE: Contiguous axial images were obtained from the base of the skull through the vertex without intravenous contrast. RADIATION DOSE REDUCTION: This exam was performed according to the departmental dose-optimization program which includes automated exposure control, adjustment of the mA and/or kV according to patient size and/or use of iterative reconstruction technique. COMPARISON:  CT head 05/27/2022. FINDINGS: Brain: There is no evidence of acute intracranial hemorrhage, mass lesion, brain edema or extra-axial fluid collection. Stable mild atrophy with mild prominence of the ventricles and subarachnoid spaces. Stable mild chronic small vessel ischemic changes within the periventricular white matter and basal ganglia. There is no CT evidence of acute cortical infarction. Vascular: Intracranial  vascular calcifications. No hyperdense vessel identified. Skull: Negative for fracture or focal lesion. Sinuses/Orbits: Minimal ethmoid sinus mucosal thickening, stable. The additional visualized paranasal sinuses, mastoid air cells and middle ears are clear. No significant orbital findings. Other: None. IMPRESSION: 1. Stable head CT without acute intracranial findings. 2. Stable mild atrophy and chronic small vessel ischemic changes. Electronically Signed   By: Richardean Sale M.D.   On: 07/03/2022 14:15   DG Chest Portable 1 View  Result Date: 07/03/2022 CLINICAL DATA:  Chest pain with dizziness for 2 days. EXAM: PORTABLE CHEST 1 VIEW COMPARISON:  Radiographs 03/07/2021 and 05/12/2015.  CT 11/01/2004. FINDINGS: 1328 hours. The heart size and mediastinal contours are stable with borderline cardiomegaly and aortic atherosclerosis. Probable mild scarring and pleural thickening at both lung bases. No superimposed edema, confluent airspace opacity, pleural effusion or pneumothorax. Advanced glenohumeral degenerative changes are present bilaterally without evidence of acute osseous abnormality. Telemetry leads overlie the chest. IMPRESSION: No evidence of acute cardiopulmonary process. Chronic findings as above. Electronically Signed   By: Richardean Sale M.D.   On: 07/03/2022 13:42      Assessment/Plan Principal Problem:   CHF exacerbation (HCC) Active Problems:   Atrial fibrillation with RVR (HCC)   Myocardial injury   Dizziness   COPD (chronic obstructive pulmonary disease) (HCC)   HLD (hyperlipidemia)   HTN (hypertension)   Hypomagnesemia   Hypothyroidism   Polymyalgia rheumatica (HCC)   Stage 3a chronic kidney disease (CKD) (HCC)   Normocytic anemia   AAA (abdominal aortic aneurysm) without rupture (HCC)   Left hip pain   Assessment and Plan: * CHF exacerbation ([place]) Patient has SOB, leg edema, positive JVD, elevated BNP 531, clinically consistent with CHF exacerbation.  Patient carries  diagnosis of CHF on medical problem list, but no 2D echo on record, not  sure which type of CHF.  Patient has long history of hypertension, may have diastolic dysfunction.  -Will admit to progressive unit as inpatient -Lasix 20 mg bid by IV -2d echo -Daily weights -strict I/O's -Low salt diet -Fluid restriction -Obtain REDs Vest reading -consulted Card    Atrial fibrillation with RVR ([place]) Patient has new onset atrial fibrillation with heart rate 120-190. CHA2DS2-VASc Score is 5.  Ideally patient will need anticoagulants chronically.  Per her daughter, patient is very easy to bleed, particularly when patient was taking aspirin.  They want to discuss with cardiology about anticoagulant use.  I did not start anticoagulants now. -Start Cardizem drip -Consulted Card  Myocardial injury Myocardial injury due to CHF exacerbation and atrial fibrillation with RVR. Troponin 21, 20 -Trend troponin -Check A1c, FLP -Patient is allergic to aspirin  Dizziness Most likely due to atrial fibrillation with RVR, heart rate up to 190. CT-hjead negative. -Fall  precaution -PT/OT  HLD (hyperlipidemia) - Patient is not taking medications currently -Follow-up FLP  COPD (chronic obstructive pulmonary disease) (HCC) Stable -Bronchodilators  HTN (hypertension) - IV hydralazine as needed -Patient is on IV Lasix  Hypomagnesemia Magnesium 1.3 -Repleted magnesium -Continue 40 mg potassium chloride (potassium is 3.9) -Check phosphorus level  Hypothyroidism Patient is not taking medications currently -Check  TSH due to A-fib  Polymyalgia rheumatica (HCC) -Continue prednisone, increased dose from 5 to 10 mg daily -Continue Plaquenil -Solu-Medrol 40 mg as stress dose x1  Stage 3a chronic kidney disease (CKD) (HCC) Slightly worsened than baseline.  Baseline creatinine 1.0-1.2.  Her creatinine is 1.43, BUN 20. -Follow-up renal function closely by BMP  Normocytic anemia Hemoglobin 8.9 (9.7 on  6/9/202 2), slightly dropped -Check anemia level.  AAA (abdominal aortic aneurysm) without rupture (HCC) - Follow-up with PCP  Left hip pain Patient complains of left hip pain and left upper leg pain -As needed Tylenol -Follow-up x-ray of left hip and left femur -As needed Norco          DVT ppx: SQ Heparin      Code Status: DNR per daughter  Family Communication:   Yes, patient's daughter by phone  Disposition Plan:  Anticipate discharge back to previous environment  Consults called: Dr. Humphrey Rolls of Card is consulted  Admission status and Level of care: Progressive: as inpt        Dispo: The patient is from: Home              Anticipated d/c is to: Home              Anticipated d/c date is: 2 days              Patient currently is not medically stable to d/c.    Severity of Illness:  The appropriate patient status for this patient is INPATIENT. Inpatient status is judged to be reasonable and necessary in order to provide the required intensity of service to ensure the patient's safety. The patient's presenting symptoms, physical exam findings, and initial radiographic and laboratory data in the context of their chronic comorbidities is felt to place them at high risk for further clinical deterioration. Furthermore, it is not anticipated that the patient will be medically stable for discharge from the hospital within 2 midnights of admission.   * I certify that at the point of admission it is my clinical judgment that the patient will require inpatient hospital care spanning beyond 2 midnights from the point of admission due to high intensity of service,  high risk for further deterioration and high frequency of surveillance required.*       Date of Service 07/04/2022    Ivor Costa [facility]   If 7PM-7AM, please contact night-coverage [email or web address] 07/04/2022, 7:38 AM

## 2022-07-03 NOTE — ED Notes (Signed)
Pt placed on a bed pan at this time.

## 2022-07-03 NOTE — Assessment & Plan Note (Signed)
Slightly worsened than baseline.  Baseline creatinine 1.0-1.2.  Her creatinine is 1.43, BUN 20. -Follow-up renal function closely by BMP

## 2022-07-03 NOTE — Assessment & Plan Note (Signed)
Patient complains of left hip pain and left upper leg pain -As needed Tylenol -Follow-up x-ray of left hip and left femur -As needed Norco

## 2022-07-03 NOTE — ED Notes (Signed)
Patient moved into a hospital bed for comfort.  New External catheter placed.  Patient provided with warm blankets and updated on plan of care

## 2022-07-03 NOTE — Assessment & Plan Note (Signed)
Myocardial injury due to CHF exacerbation and atrial fibrillation with RVR. Troponin 21, 20 -Trend troponin -Check A1c, FLP -Patient is allergic to aspirin

## 2022-07-03 NOTE — Assessment & Plan Note (Signed)
Stable -Bronchodilators 

## 2022-07-03 NOTE — Assessment & Plan Note (Signed)
-  Continue prednisone, increased dose from 5 to 10 mg daily -Continue Plaquenil -Solu-Medrol 40 mg as stress dose x1

## 2022-07-04 ENCOUNTER — Inpatient Hospital Stay
Admit: 2022-07-04 | Discharge: 2022-07-04 | Disposition: A | Discharge status: Home / Self Care | Payer: Medicare Other | Attending: Physician Assistant | Admitting: Physician Assistant

## 2022-07-04 DIAGNOSIS — I5023 Acute on chronic systolic (congestive) heart failure: Secondary | ICD-10-CM

## 2022-07-04 LAB — BASIC METABOLIC PANEL
Anion gap: 9 (ref 5–15)
BUN: 21 mg/dL (ref 8–23)
CO2: 23 mmol/L (ref 22–32)
Calcium: 8.7 mg/dL — ABNORMAL LOW (ref 8.9–10.3)
Chloride: 107 mmol/L (ref 98–111)
Creatinine, Ser: 1.48 mg/dL — ABNORMAL HIGH (ref 0.44–1.00)
GFR, Estimated: 33 mL/min — ABNORMAL LOW (ref >60)
Glucose, Bld: 138 mg/dL — ABNORMAL HIGH (ref 70–99)
Potassium: 5.2 mmol/L — ABNORMAL HIGH (ref 3.5–5.1)
Sodium: 139 mmol/L (ref 135–145)

## 2022-07-04 LAB — RETICULOCYTES
Immature Retic Fract: 15.6 % (ref 2.3–15.9)
RBC.: 3.37 MIL/uL — ABNORMAL LOW (ref 3.87–5.11)
Retic Count, Absolute: 49.9 10*3/uL (ref 19.0–186.0)
Retic Ct Pct: 1.5 % (ref 0.4–3.1)

## 2022-07-04 LAB — MAGNESIUM: Magnesium: 1.8 mg/dL (ref 1.7–2.4)

## 2022-07-04 LAB — ECHOCARDIOGRAM COMPLETE
AR max vel: 0.54 cm2
AV Area VTI: 0.49 cm2
AV Area mean vel: 0.5 cm2
AV Mean grad: 21.7 mmHg
AV Peak grad: 35.6 mmHg
Ao pk vel: 2.98 m/s
Area-P 1/2: 7.02 cm2
Height: 64 in
S' Lateral: 3.6 cm
Weight: 2560 oz

## 2022-07-04 LAB — VITAMIN B12: Vitamin B-12: 669 pg/mL (ref 180–914)

## 2022-07-04 MED ORDER — APIXABAN 2.5 MG PO TABS
2.5000 mg | ORAL_TABLET | Freq: Two times a day (BID) | ORAL | Status: DC
  Administered 2022-07-04 – 2022-07-06 (×5): 2.5 mg via ORAL
  Filled 2022-07-04 (×5): qty 1

## 2022-07-04 MED ORDER — INFLUENZA VAC A&B SA ADJ QUAD 0.5 ML IM PRSY
0.5000 mL | PREFILLED_SYRINGE | INTRAMUSCULAR | Status: AC
  Administered 2022-07-05: 0.5 mL via INTRAMUSCULAR
  Filled 2022-07-04: qty 0.5

## 2022-07-04 MED ORDER — AMIODARONE HCL 200 MG PO TABS
200.0000 mg | ORAL_TABLET | Freq: Every day | ORAL | Status: DC
  Administered 2022-07-04 – 2022-07-06 (×3): 200 mg via ORAL
  Filled 2022-07-04 (×3): qty 1

## 2022-07-04 NOTE — ED Notes (Signed)
HR continues to increase.  Cardizem infusion restarted at this time.  Will continue to monitor

## 2022-07-04 NOTE — Consult Note (Signed)
Stacy [name] is a 88 y.o. female  [id number]  Primary Cardiologist: Neoma Laming, MD Reason for Consultation: CHF  HPI: Stacy [name] is a 88 y.o. female with medical history significant of CHF (listed on medical problem list), HTN, HLD, COPD, hypothyroidism, polymyalgia rheumatica, breast cancer (s/p of radiation therapy), CKD stage IIIa, colitis, AAA,  who presented to the ED on 07/03/22 with dizziness and chest pain.  Patient found to be in new onset atrial fibrillation.   Review of Systems: poor historian, denies chest pain, shortness of breath improved.    Past Medical History:  Diagnosis Date   Arthritis 1940   Asthma    Bowel trouble    Cancer ([place]) 2006   DCIS left breast   Hypertension 1940   Incisional hernia 2014   Personal history of radiation therapy    Polymyalgia ([place])    Thyroid disorder    Vitiligo 2012    (Not in a hospital admission)     furosemide  20 mg Intravenous Q12H   gabapentin  100 mg Oral q1600   gabapentin  200 mg Oral BID   heparin  5,000 Units Subcutaneous Q8H   hydroxychloroquine  200 mg Oral BID   pantoprazole  40 mg Oral BID   predniSONE  10 mg Oral Q breakfast    Infusions:  cefTRIAXone (ROCEPHIN)  IV     diltiazem (CARDIZEM) infusion 5 mg/hr (07/04/22 0926)    Allergies  Allergen Reactions   Aspirin Itching and Other (See Comments)    Dizzy/passing out   Augmentin [Amoxicillin-Pot Clavulanate]    Codeine Other (See Comments)    dizziness   Doxycycline Hyclate Other (See Comments)   Sulfamethoxazole-Trimethoprim Diarrhea   Tape Itching   Vitamin D Analogs    Azithromycin Rash and Hives    Social History   Socioeconomic History   Marital status: Married    Spouse name: Not on file   Number of children: Not on file   Years of education: Not on file   Highest education level: Not on file  Occupational History   Not on file  Tobacco Use   Smoking status: Never   Smokeless tobacco: Never  Vaping Use   Vaping Use:  Never used  Substance and Sexual Activity   Alcohol use: No   Drug use: No   Sexual activity: Not on file  Other Topics Concern   Not on file  Social History Narrative   Not on file   Social Determinants of Health   Financial Resource Strain: Low Risk  (04/07/2021)   Overall Financial Resource Strain (CARDIA)    Difficulty of Paying Living Expenses: Not very hard  Food Insecurity: Not on file  Transportation Needs: Not on file  Physical Activity: Not on file  Stress: Not on file  Social Connections: Not on file  Intimate Partner Violence: Not on file    Family History  Problem Relation Age of Onset   Heart disease Mother    Emphysema Mother    Arthritis Father    Cancer Father    Breast cancer Neg Hx     PHYSICAL EXAM: Vitals:   07/04/22 0752 07/04/22 0800  BP:  112/86  Pulse:  (!) 108  Resp:  18  Temp: 98.5 F (36.9 C)   SpO2:  98%     Intake/Output Summary (Last 24 hours) at 07/04/2022 0927 Last data filed at 07/04/2022 0926 Gross per 24 hour  Intake 199.64 ml  Output --  Net  199.64 ml    General:  Well appearing. No respiratory difficulty HEENT: normal Neck: supple. no JVD. Carotids 2+ bilat; no bruits. No lymphadenopathy or thryomegaly appreciated. Cor: PMI nondisplaced. Regular rate & rhythm. No rubs, gallops or murmurs. Lungs: clear Abdomen: soft, nontender, nondistended. No hepatosplenomegaly. No bruits or masses. Good bowel sounds. Extremities: no cyanosis, clubbing, rash, edema Neuro: alert & oriented x 3, cranial nerves grossly intact. moves all 4 extremities w/o difficulty. Affect pleasant.  ECG: atrial fibrillation, HR 113 bpm  Results for orders placed or performed during the hospital encounter of 07/03/22 (from the past 24 hour(s))  Basic metabolic panel     Status: Abnormal   Collection Time: 07/03/22 12:45 PM  Result Value Ref Range   Sodium 139 135 - 145 mmol/L   Potassium 3.9 3.5 - 5.1 mmol/L   Chloride 105 98 - 111 mmol/L   CO2 27  22 - 32 mmol/L   Glucose, Bld 88 70 - 99 mg/dL   BUN 20 8 - 23 mg/dL   Creatinine, Ser 1.43 (H) 0.44 - 1.00 mg/dL   Calcium 8.4 (L) 8.9 - 10.3 mg/dL   GFR, Estimated 34 (L) >60 mL/min   Anion gap 7 5 - 15  CBC     Status: Abnormal   Collection Time: 07/03/22 12:45 PM  Result Value Ref Range   WBC 8.3 4.0 - 10.5 K/uL   RBC 3.41 (L) 3.87 - 5.11 MIL/uL   Hemoglobin 8.9 (L) 12.0 - 15.0 g/dL   HCT 29.9 (L) 36.0 - 46.0 %   MCV 87.7 80.0 - 100.0 fL   MCH 26.1 26.0 - 34.0 pg   MCHC 29.8 (L) 30.0 - 36.0 g/dL   RDW 15.7 (H) 11.5 - 15.5 %   Platelets 250 150 - 400 K/uL   nRBC 0.0 0.0 - 0.2 %  Troponin I (High Sensitivity)     Status: Abnormal   Collection Time: 07/03/22 12:45 PM  Result Value Ref Range   Troponin I (High Sensitivity) 21 (H) <18 ng/L  Magnesium     Status: Abnormal   Collection Time: 07/03/22 12:45 PM  Result Value Ref Range   Magnesium 1.3 (L) 1.7 - 2.4 mg/dL  Brain natriuretic peptide     Status: Abnormal   Collection Time: 07/03/22 12:45 PM  Result Value Ref Range   B Natriuretic Peptide 531.2 (H) 0.0 - 100.0 pg/mL  Urinalysis, Routine w reflex microscopic Urine, Clean Catch     Status: Abnormal   Collection Time: 07/03/22  2:28 PM  Result Value Ref Range   Color, Urine YELLOW (A) YELLOW   APPearance HAZY (A) CLEAR   Specific Gravity, Urine 1.006 1.005 - 1.030   pH 5.0 5.0 - 8.0   Glucose, UA NEGATIVE NEGATIVE mg/dL   Hgb urine dipstick NEGATIVE NEGATIVE   Bilirubin Urine NEGATIVE NEGATIVE   Ketones, ur NEGATIVE NEGATIVE mg/dL   Protein, ur NEGATIVE NEGATIVE mg/dL   Nitrite NEGATIVE NEGATIVE   Leukocytes,Ua LARGE (A) NEGATIVE   RBC / HPF 0-5 0 - 5 RBC/hpf   WBC, UA >50 (H) 0 - 5 WBC/hpf   Bacteria, UA MANY (A) NONE SEEN   Squamous Epithelial / LPF 0-5 0 - 5   WBC Clumps PRESENT    Hyaline Casts, UA PRESENT   Troponin I (High Sensitivity)     Status: Abnormal   Collection Time: 07/03/22  2:56 PM  Result Value Ref Range   Troponin I (High Sensitivity) 20  (H) <18 ng/L  Lipid panel     Status: Abnormal   Collection Time: 07/03/22  2:56 PM  Result Value Ref Range   Cholesterol 117 0 - 200 mg/dL   Triglycerides 82 <150 mg/dL   HDL 38 (L) >40 mg/dL   Total CHOL/HDL Ratio 3.1 RATIO   VLDL 16 0 - 40 mg/dL   LDL Cholesterol 63 0 - 99 mg/dL  Procalcitonin     Status: None   Collection Time: 07/03/22  2:56 PM  Result Value Ref Range   Procalcitonin <0.10 ng/mL  Phosphorus     Status: None   Collection Time: 07/03/22  2:56 PM  Result Value Ref Range   Phosphorus 3.3 2.5 - 4.6 mg/dL  TSH     Status: Abnormal   Collection Time: 07/03/22  2:56 PM  Result Value Ref Range   TSH 6.219 (H) 0.350 - 4.500 uIU/mL  Folate     Status: None   Collection Time: 07/03/22  2:56 PM  Result Value Ref Range   Folate >40.0 >5.9 ng/mL  Iron and TIBC     Status: Abnormal   Collection Time: 07/03/22  2:56 PM  Result Value Ref Range   Iron 26 (L) 28 - 170 ug/dL   TIBC 300 250 - 450 ug/dL   Saturation Ratios 9 (L) 10.4 - 31.8 %   UIBC 274 ug/dL  Ferritin     Status: None   Collection Time: 07/03/22  2:56 PM  Result Value Ref Range   Ferritin 16 11 - 307 ng/mL  Hemoglobin A1c     Status: None   Collection Time: 07/03/22  3:39 PM  Result Value Ref Range   Hgb A1c MFr Bld 5.4 4.8 - 5.6 %   Mean Plasma Glucose 108.28 mg/dL  Lactic acid, plasma     Status: None   Collection Time: 07/03/22  3:39 PM  Result Value Ref Range   Lactic Acid, Venous 1.1 0.5 - 1.9 mmol/L  Reticulocytes     Status: Abnormal   Collection Time: 07/03/22 11:30 PM  Result Value Ref Range   Retic Ct Pct 1.5 0.4 - 3.1 %   RBC. 3.37 (L) 3.87 - 5.11 MIL/uL   Retic Count, Absolute 49.9 19.0 - 186.0 K/uL   Immature Retic Fract 15.6 2.3 - 27.5 %  Basic metabolic panel     Status: Abnormal   Collection Time: 07/04/22  5:57 AM  Result Value Ref Range   Sodium 139 135 - 145 mmol/L   Potassium 5.2 (H) 3.5 - 5.1 mmol/L   Chloride 107 98 - 111 mmol/L   CO2 23 22 - 32 mmol/L   Glucose, Bld  138 (H) 70 - 99 mg/dL   BUN 21 8 - 23 mg/dL   Creatinine, Ser 1.48 (H) 0.44 - 1.00 mg/dL   Calcium 8.7 (L) 8.9 - 10.3 mg/dL   GFR, Estimated 33 (L) >60 mL/min   Anion gap 9 5 - 15  Magnesium     Status: None   Collection Time: 07/04/22  5:57 AM  Result Value Ref Range   Magnesium 1.8 1.7 - 2.4 mg/dL   DG FEMUR MIN 2 VIEWS LEFT  Result Date: 07/03/2022 CLINICAL DATA:  Injury EXAM: LEFT FEMUR 2 VIEWS COMPARISON:  None FINDINGS: Osseous demineralization. Knee joint alignment normal which joint space narrowing seen. Collapse of LEFT femoral head suspect underlying avascular necrosis Secondary degenerative changes of the LEFT hip joint. No fracture or dislocation. IMPRESSION: Osseous demineralization with probable avascular necrosis changes of LEFT  femoral head with partial collapse and secondary degenerative changes at the LEFT hip joint. No acute abnormalities. Electronically Signed   By: Lavonia Dana M.D.   On: 07/03/2022 19:49   DG HIP UNILAT WITH PELVIS 2-3 VIEWS LEFT  Result Date: 07/03/2022 CLINICAL DATA:  LEFT hip injury EXAM: DG HIP (WITH OR WITHOUT PELVIS) 2-3V LEFT COMPARISON:  None FINDINGS: Osseous demineralization. Flattening of the femoral head with mixed sclerosis and lucency likely reflecting avascular necrosis and partial collapse. Secondary degenerative changes of LEFT hip joint. No acute fracture, dislocation, or bone destruction. Narrowing of RIGHT hip joint space. Degenerative facet disease changes lower lumbar spine. Foci of soft tissue gas extending into the inferior RIGHT pelvis/RIGHT inguinal region question RIGHT inguinal versus ventral hernia. IMPRESSION: Suspected avascular necrosis of the LEFT femoral head with partial collapse and advanced secondary degenerative changes. No acute fracture or dislocation. Question RIGHT inguinal or ventral hernia; recommend correlation with physical exam. Electronically Signed   By: Lavonia Dana M.D.   On: 07/03/2022 19:48   CT HEAD WO  CONTRAST (5MM)  Result Date: 07/03/2022 CLINICAL DATA:  Head trauma, moderate to severe. Dizziness over the last 2 days. EXAM: CT HEAD WITHOUT CONTRAST TECHNIQUE: Contiguous axial images were obtained from the base of the skull through the vertex without intravenous contrast. RADIATION DOSE REDUCTION: This exam was performed according to the departmental dose-optimization program which includes automated exposure control, adjustment of the mA and/or kV according to patient size and/or use of iterative reconstruction technique. COMPARISON:  CT head 05/27/2022. FINDINGS: Brain: There is no evidence of acute intracranial hemorrhage, mass lesion, brain edema or extra-axial fluid collection. Stable mild atrophy with mild prominence of the ventricles and subarachnoid spaces. Stable mild chronic small vessel ischemic changes within the periventricular white matter and basal ganglia. There is no CT evidence of acute cortical infarction. Vascular: Intracranial vascular calcifications. No hyperdense vessel identified. Skull: Negative for fracture or focal lesion. Sinuses/Orbits: Minimal ethmoid sinus mucosal thickening, stable. The additional visualized paranasal sinuses, mastoid air cells and middle ears are clear. No significant orbital findings. Other: None. IMPRESSION: 1. Stable head CT without acute intracranial findings. 2. Stable mild atrophy and chronic small vessel ischemic changes. Electronically Signed   By: Richardean Sale M.D.   On: 07/03/2022 14:15   DG Chest Portable 1 View  Result Date: 07/03/2022 CLINICAL DATA:  Chest pain with dizziness for 2 days. EXAM: PORTABLE CHEST 1 VIEW COMPARISON:  Radiographs 03/07/2021 and 05/12/2015.  CT 11/01/2004. FINDINGS: 1328 hours. The heart size and mediastinal contours are stable with borderline cardiomegaly and aortic atherosclerosis. Probable mild scarring and pleural thickening at both lung bases. No superimposed edema, confluent airspace opacity, pleural effusion  or pneumothorax. Advanced glenohumeral degenerative changes are present bilaterally without evidence of acute osseous abnormality. Telemetry leads overlie the chest. IMPRESSION: No evidence of acute cardiopulmonary process. Chronic findings as above. Electronically Signed   By: Richardean Sale M.D.   On: 07/03/2022 13:42     ASSESSMENT AND PLAN: Patient poor historian. Denies chest pain, shortness of breath improved. Heart rate improved. Echo results pending. Recommend starting amiodarone and low dose Eliquis for stroke prevention. Will continue to follow.   Engineer, drilling  FNP-C

## 2022-07-04 NOTE — ED Notes (Signed)
Echo at bedside

## 2022-07-04 NOTE — Hospital Course (Signed)
Stacy [name] is a 88 y.o. female with medical history significant of chronic HFrEF, HTN, HLD, COPD, hypothyroidism, polymyalgia rheumatica on chronic prednisone, breast cancer (s/p of radiation therapy), CKD stage IIIa, colitis, AAA, obesity (now with BMI 27.46), who presented on 07/03/2022 for evaluation of dizziness and chest pain.  Pt is poor historian, family provided most history.  Pt is from home, minimally ambulatory at recent baseline.  She had a recent fall last week and had been reporting generalized weakness, and being dizzy/lightheaded at home for several days.   She was found to be in A-fib with RVR, apparently new onset.  Dr. Humphrey Rolls of cardiology was consulted. Patient started on Cardizem infusion to get HR controlled.  Patient also found to have a UTI and started on empiric Rocephin pending urine culture results.

## 2022-07-04 NOTE — Evaluation (Signed)
Physical Therapy Evaluation Patient Details Name: Stacy [name] MRN: [id number] DOB: 1927/05/29 Today's Date: 07/04/2022  History of Present Illness  Stacy [name] is a 88 y.o. female here with multiple complaints.  The patient's primary complaint is intermittent weakness, dizziness, and fatigue.  The patient also had some mild chest pressure intermittently.  The patient has reportedly been increasingly weak over the last several days and has gone from occasionally being able to scoot herself around the house to essentially immobile.  She states that she has felt very lightheaded intermittently.  She has felt some dizziness which she describes more as a lightheadedness.  No focal numbness or weakness.  Denies any known medication changes.  Denies any fevers or chills.  Clinical Impression  Pt is a pleasant 88 year old female who was admitted for CHF exacerbation with Afib. Pt performs bed mobility with mod assist and attempted standing with max assist. Doesn't walk at baseline, however transfers between surfaces. Pt demonstrates deficits with strength/mobility. With exertion, HR increased to 135bpm while on cardizem drip (previously cleared with no restrictions per cardiology NP). Would benefit from skilled PT to address above deficits and promote optimal return to PLOF; recommend transition to STR upon discharge from acute hospitalization.       Recommendations for follow up therapy are one component of a multi-disciplinary discharge planning process, led by the attending physician.  Recommendations may be updated based on patient status, additional functional criteria and insurance authorization.  Follow Up Recommendations Skilled nursing-short term rehab (<3 hours/day) Can patient physically be transported by private vehicle: No    Assistance Recommended at Discharge Frequent or constant Supervision/Assistance  Patient can return home with the following  Two people to help with walking and/or  transfers;A lot of help with bathing/dressing/bathroom    Equipment Recommendations  (TBD)  Recommendations for Other Services       Functional Status Assessment Patient has had a recent decline in their functional status and demonstrates the ability to make significant improvements in function in a reasonable and predictable amount of time.     Precautions / Restrictions Precautions Precautions: Fall Restrictions Weight Bearing Restrictions: No      Mobility  Bed Mobility Overal bed mobility: Needs Assistance Bed Mobility: Supine to Sit, Sit to Supine     Supine to sit: Mod assist Sit to supine: Mod assist   General bed mobility comments: takes extended time and needs assist with B LEs. Once seated, able to sit with supervision. HR increases to 135bpm. Needed mod assist for return to supine in addition to repositioning    Transfers Overall transfer level: Needs assistance Equipment used: 1 person hand held assist Transfers: Sit to/from Stand Sit to Stand: Max assist           General transfer comment: unable to perform lift off for standing. Unable to fully stand    Ambulation/Gait               General Gait Details: not performed at baseline  Stairs            Wheelchair Mobility    Modified Rankin (Stroke Patients Only)       Balance Overall balance assessment: Needs assistance Sitting-balance support: No upper extremity supported, Feet supported Sitting balance-Leahy Scale: Fair     Standing balance support: During functional activity, Single extremity supported Standing balance-Leahy Scale: Poor  Pertinent Vitals/Pain Pain Assessment Pain Assessment: Faces Faces Pain Scale: Hurts little more Pain Location: L hip/shoulder Pain Descriptors / Indicators: Aching Pain Intervention(s): Limited activity within patient's tolerance, Repositioned    Home Living Family/patient expects to be  discharged to:: Private residence Living Arrangements: Children (lives at home with son, however she is caregiver to him. He is unable to physically assist her) Available Help at Discharge: Available PRN/intermittently;Family Type of Home: House Home Access: Ramped entrance       Home Layout: Two level;Able to live on main level with bedroom/bathroom Home Equipment: Rollator (4 wheels);Cane - single point (office chair)      Prior Function Prior Level of Function : Needs assist       Physical Assist : ADLs (physical)   ADLs (physical): Grooming;Bathing;Dressing;Toileting;IADLs Mobility Comments: performs lateral transfers between surfaces. Rarely stands and does not ambulate in years. Last went to the doctor, family had to pick up patient and carry her to car       Hand Dominance        Extremity/Trunk Assessment   Upper Extremity Assessment Upper Extremity Assessment: Defer to OT evaluation    Lower Extremity Assessment Lower Extremity Assessment: Generalized weakness (B LE grossly 3/5)       Communication   Communication: No difficulties  Cognition Arousal/Alertness: Awake/alert Behavior During Therapy: WFL for tasks assessed/performed Overall Cognitive Status: History of cognitive impairments - at baseline                                 General Comments: pleasant and agreeable. Unaware of functional deficits        General Comments      Exercises     Assessment/Plan    PT Assessment Patient needs continued PT services  PT Problem List Decreased strength;Decreased activity tolerance;Decreased balance;Decreased mobility       PT Treatment Interventions Gait training;DME instruction;Therapeutic exercise;Balance training    PT Goals (Current goals can be found in the Care Plan section)  Acute Rehab PT Goals Patient Stated Goal: to go home PT Goal Formulation: With patient Time For Goal Achievement: 07/18/22 Potential to Achieve Goals:  Good    Frequency Min 2X/week     Co-evaluation               AM-PAC PT "6 Clicks" Mobility  Outcome Measure Help needed turning from your back to your side while in a flat bed without using bedrails?: A Little Help needed moving from lying on your back to sitting on the side of a flat bed without using bedrails?: A Lot Help needed moving to and from a bed to a chair (including a wheelchair)?: Total Help needed standing up from a chair using your arms (e.g., wheelchair or bedside chair)?: Total Help needed to walk in hospital room?: Total Help needed climbing 3-5 steps with a railing? : Total 6 Click Score: 9    End of Session Equipment Utilized During Treatment: Gait belt Activity Tolerance: Patient limited by fatigue Patient left: in bed;with bed alarm set;with family/visitor present Nurse Communication: Mobility status PT Visit Diagnosis: Unsteadiness on feet (R26.81);Muscle weakness (generalized) (M62.81);Difficulty in walking, not elsewhere classified (R26.2)    Time: 1027-2536 PT Time Calculation (min) (ACUTE ONLY): 21 min   Charges:   PT Evaluation $PT Eval Low Complexity: 1 Low          Greggory Stallion, PT, DPT, GCS [phone]   Tiyah Zelenak 07/04/2022,  3:07 PM

## 2022-07-04 NOTE — NC FL2 (Signed)
[place] LEVEL OF CARE SCREENING TOOL     IDENTIFICATION  Patient Name: Stacy [name] Birthdate: 1927/02/07 Sex: female Admission Date (Current Location): 07/03/2022  [facility] and [place] Number:  Engineering geologist and Address:  [facility], [address], [place], [place] [address]      Provider Number: [id number]  Attending Physician Name and Address:  Ivor Costa, MD  Relative Name and Phone Number:  [name] (daughter) [phone]    Current Level of Care: Hospital Recommended Level of Care: [place] Prior Approval Number:    Date Approved/Denied:   PASRR Number: [id number]  Discharge Plan: SNF    Current Diagnoses: Patient Active Problem List   Diagnosis Date Noted   Atrial fibrillation with RVR ([place]) 07/03/2022   CHF exacerbation ([place]) 07/03/2022   Dizziness 07/03/2022   Myocardial injury 07/03/2022   Stage 3a chronic kidney disease (CKD) ([place]) 07/03/2022   Normocytic anemia 07/03/2022   Left hip pain 07/03/2022   AAA (abdominal aortic aneurysm) without rupture ([place]) 06/15/2022   Malnutrition of moderate degree 03/13/2021   Hypomagnesemia 03/08/2021   AKI (acute kidney injury) ([place]) 03/08/2021   Chronic respiratory failure with hypoxia ([place]) 03/07/2021   Acute blood loss anemia 03/07/2021   Leukocytosis 03/07/2021   Acute colitis on CT 03/07/2021   Encounter for general adult medical examination with abnormal findings 07/05/2018   Neoplasm of uncertain behavior of skin of hand 07/05/2018   Simple chronic bronchitis ([place]) 12/31/2017   Congestive heart failure ([place]) 10/20/2017   Primary generalized (osteo)arthritis 09/21/2017   Vitamin B12 deficiency anemia, unspecified 09/21/2017   Chronic kidney disease, unspecified 09/21/2017   Vitamin D deficiency, unspecified 09/21/2017   COPD (chronic obstructive pulmonary disease) ([place]) 09/21/2017   Unspecified hearing loss, bilateral 09/21/2017   Age-related  osteoporosis without current pathological fracture 09/21/2017   Other amnesia 09/21/2017   Cough 09/21/2017   Allergic rhinitis, unspecified 09/21/2017   Other seborrheic dermatitis 09/21/2017   Unspecified cervical disc disorder, mid-cervical region, unspecified level 09/21/2017   Neuromuscular dysfunction of bladder, unspecified 09/21/2017   Allergic rhinitis due to pollen 09/21/2017   Other primary ovarian failure 09/21/2017   Shortness of breath 09/21/2017   Acute bronchitis, unspecified 09/21/2017   Benign and innocent cardiac murmurs 09/21/2017   Dysuria 09/21/2017   Retention of urine, unspecified 09/21/2017   Unspecified urinary incontinence 09/21/2017   Mixed incontinence 09/21/2017   Insomnia, unspecified 09/21/2017   Presyncope secondary to symptomatic anemia 09/21/2017   Polymyalgia rheumatica ([place]) 09/21/2017   Low back pain 09/21/2017   Hypoxemia 09/21/2017   Spondylosis without myelopathy or radiculopathy, lumbosacral region 09/21/2017   Pain in unspecified knee 09/21/2017   HLD (hyperlipidemia) 09/21/2017   Cardiomegaly 09/21/2017   Abnormality of gait and mobility 09/21/2017   Personal history of malignant neoplasm of breast 04/15/2013   History of breast cancer 12/24/2012   Incisional hernia, without obstruction or gangrene 12/24/2012   HTN (hypertension)    Vitiligo    Arthritis    Hypothyroidism    Bowel trouble     Orientation RESPIRATION BLADDER Height & Weight     Self, Time, Situation, Place  Normal Incontinent, External catheter Weight: 160 lb (72.6 kg) Height:  '5\' 4"'$  (162.6 cm)  BEHAVIORAL SYMPTOMS/MOOD NEUROLOGICAL BOWEL NUTRITION STATUS      Continent Diet (see discharge summary)  AMBULATORY STATUS COMMUNICATION OF NEEDS Skin   Extensive Assist Verbally Other (Comment), Skin abrasions (abrasion left arm)  Personal Care Assistance Level of Assistance  Bathing, Feeding, Dressing, Total care Bathing Assistance:  Maximum assistance Feeding assistance: Limited assistance Dressing Assistance: Maximum assistance Total Care Assistance: Maximum assistance   Functional Limitations Info  Sight, Hearing, Speech Sight Info: Adequate Hearing Info: Impaired Speech Info: Adequate    SPECIAL CARE FACTORS FREQUENCY  PT (By licensed PT), OT (By licensed OT)     PT Frequency: min 4x weekly OT Frequency: min 4x weekly            Contractures Contractures Info: Not present    Additional Factors Info  Code Status, Allergies Code Status Info: DNR Allergies Info: Aspirin   Augmentin (Amoxicillin-pot Clavulanate)   Codeine   Doxycycline Hyclate   Sulfamethoxazole-trimethoprim   Tape   Vitamin D Analogs   Azithromycin           Current Medications (07/04/2022):  This is the current hospital active medication list Current Facility-Administered Medications  Medication Dose Route Frequency Provider Last Rate Last Admin   acetaminophen (TYLENOL) tablet 650 mg  650 mg Oral Q6H PRN Ivor Costa, MD       albuterol (PROVENTIL) (2.5 MG/3ML) 0.083% nebulizer solution 3 mL  3 mL Inhalation Q4H PRN Ivor Costa, MD       amiodarone (PACERONE) tablet 200 mg  200 mg Oral Daily Scoggins, Amber, NP   200 mg at 07/04/22 1145   apixaban (ELIQUIS) tablet 2.5 mg  2.5 mg Oral BID Scoggins, Amber, NP   2.5 mg at 07/04/22 1145   cefTRIAXone (ROCEPHIN) 2 g in sodium chloride 0.9 % 100 mL IVPB  2 g Intravenous Q24H Ivor Costa, MD       dextromethorphan-guaiFENesin ([place] DM) 30-600 MG per 12 hr tablet 1 tablet  1 tablet Oral BID PRN Ivor Costa, MD       diltiazem (CARDIZEM) 125 mg in dextrose 5% 125 mL (1 mg/mL) infusion  5-15 mg/hr Intravenous Titrated Ivor Costa, MD 5 mL/hr at 07/04/22 0926 5 mg/hr at 07/04/22 0926   furosemide (LASIX) injection 20 mg  20 mg Intravenous Huntley Dec, MD   20 mg at 07/04/22 0601   gabapentin (NEURONTIN) capsule 100 mg  100 mg Oral q1600 Ivor Costa, MD       gabapentin (NEURONTIN) capsule 200  mg  200 mg Oral BID Ivor Costa, MD   200 mg at 07/04/22 1103   hydrALAZINE (APRESOLINE) injection 5 mg  5 mg Intravenous Q2H PRN Ivor Costa, MD       HYDROcodone-acetaminophen (NORCO/VICODIN) 5-325 MG per tablet 0.5 tablet  0.5 tablet Oral TID PRN Ivor Costa, MD       hydroxychloroquine (PLAQUENIL) tablet 200 mg  200 mg Oral BID Ivor Costa, MD   200 mg at 07/04/22 1103   [START ON 07/05/2022] influenza vaccine adjuvanted (FLUAD) injection 0.5 mL  0.5 mL Intramuscular Tomorrow-1000 Ivor Costa, MD       lidocaine (LIDODERM) 5 % 1 patch  1 patch Transdermal Daily PRN Ivor Costa, MD       ondansetron [facility]) injection 4 mg  4 mg Intravenous Q8H PRN Ivor Costa, MD   4 mg at 07/03/22 1656   pantoprazole (PROTONIX) EC tablet 40 mg  40 mg Oral BID Ivor Costa, MD   40 mg at 07/04/22 1103   predniSONE (DELTASONE) tablet 10 mg  10 mg Oral Q breakfast Ivor Costa, MD   10 mg at 07/04/22 5397     Discharge Medications: Please see discharge summary for a list  of discharge medications.  Relevant Imaging Results:  Relevant Lab Results:   Additional Information SSN: [id number]  Alberteen Sam, LCSW

## 2022-07-04 NOTE — ED Notes (Signed)
Arbutus Ped, DO at bedside assessing patient and going over plan of care with patient and family.

## 2022-07-04 NOTE — Progress Notes (Signed)
Progress Note   Patient: Stacy [name] [id number] DOB: September 06, 1927 DOA: 07/03/2022     1 DOS: the patient was seen and examined on 07/04/2022   Brief hospital course: Stacy [name] is a 88 y.o. female with medical history significant of chronic HFrEF, HTN, HLD, COPD, hypothyroidism, polymyalgia rheumatica on chronic prednisone, breast cancer (s/p of radiation therapy), CKD stage IIIa, colitis, AAA, obesity (now with BMI 27.46), who presented on 07/03/2022 for evaluation of dizziness and chest pain.  Pt is poor historian, family provided most history.  Pt is from home, minimally ambulatory at recent baseline.  She had a recent fall last week and had been reporting generalized weakness, and being dizzy/lightheaded at home for several days.   She was found to be in A-fib with RVR, apparently new onset.  Dr. Humphrey [name] of cardiology was consulted. Patient started on Cardizem infusion to get HR controlled.  Patient also found to have a UTI and started on empiric Rocephin pending urine culture results.    Assessment and Plan: * Acute on chronic HFrEF (heart failure with reduced ejection fraction) (HCC) Presented with SOB, leg edema, positive JVD, elevated BNP 531, clinically consistent with CHF decompensation, likely driven by A-fib with RVR. Echo 10/9 shows EF 45-50% --Cardiology following, see their recommendations --Started on IV Lasix 20 mg BID on admission --Monitor I/O's daily weights --Montior & replace electrolytes --Mgmt per cardiology   Atrial fibrillation with RVR ([place]) Presented with new onset atrial fibrillation with heart rate 120-190. CHA2DS2-VASc Score is 5.  Ideally patient will need anticoagulants chronically.  Per her daughter, patient is very easy to bleed, particularly when patient was taking aspirin. Pt is also high falls risk. --defer anticoagulation for now, pending risk/benefit discussions --cardiology consulted, Dr. Humphrey [name] --On Cardizem drip --Telemetry  Myocardial  injury Mild troponin elevation due to demand ischemia with A-fib RVR and decompensated CHF.  --aspirin allergy noted --EKG and repeat troponin if pt has active chest pain  Dizziness Most likely due to atrial fibrillation with RVR, heart rate up to 190. CT-hjead negative. -Fall  precaution -PT/OT  HLD (hyperlipidemia) Not on medications.   Lipid profile: total chol 117, LDL 63, HDL low 38, TG's 82 --Mgmt per cardiology  COPD (chronic obstructive pulmonary disease) (HCC) Stable -Bronchodilators  HTN (hypertension) - IV hydralazine as needed -Patient is on IV Lasix  Hypomagnesemia Magnesium 1.3 on admission was replaced. Monitor Mg level, goal 2.0, replace PRN  Hypothyroidism Patient is not taking medications currently. TSH mildly elevated 6.219, but in setting of acute illness. --check free T4 --defer to primary care  Polymyalgia rheumatica ([place]) -Continue prednisone, increased dose from 5 to 10 mg daily -Continue Plaquenil -Solu-Medrol 40 mg as stress dose x1  Stage 3a chronic kidney disease (CKD) ([place]) Slightly worsened than baseline.  Baseline creatinine 1.0-1.2.  Her creatinine is 1.43, BUN 20. -Follow-up renal function closely by BMP  Normocytic anemia Hemoglobin 8.9 (9.7 on 6/9/202 2), slightly dropped -Check anemia level.  AAA (abdominal aortic aneurysm) without rupture (HCC) - Follow-up with PCP  Left hip pain Avascular necrosis of left hip - due to chronic prednisone most likely Presented with complaints of left hip pain and left upper leg pain, unclear but per family seems chronic, or acute on chronic since her most recent fall. -As needed Tylenol -As needed Norco -PT/OT -family report pt declined hip replacement years ago        Subjective: Pt seen in the ED with son and daughter-in-law at bedside.  Pt reports  feeling okay, is not really able to give history.  Doesn't have acute complaints except the BP cuff squeezing too tight.  Family report pt  lives with another son.  She is minimally ambulatory, and her son at home isn't able to help very much.  Pt complains of hip pain on and off, declined to have hip replacement many years ago.   Pt denies any urinary symptoms or tenderness over her bladder, but says she isn't sure.   Physical Exam: Vitals:   07/04/22 1148 07/04/22 1315 07/04/22 1330 07/04/22 1523  BP:  90/63 102/66 102/62  Pulse:  73 77 (!) 106  Resp:  18 18   Temp: 98.4 F (36.9 C)   97.7 F (36.5 C)  TempSrc: Oral   Oral  SpO2:  96% 100% 99%  Weight:      Height:       General exam: awake, alert, no acute distress, frail HEENT: atraumatic, clear conjunctiva, anicteric sclera, moist mucus membranes, hearing grossly normal  Respiratory system: diminished breath sounds but clear b/l, no wheezes, normal respiratory effort. Cardiovascular system: normal S1/S2, irregularly irregular, no peripheral edema.   Gastrointestinal system: soft, NT, ND Central nervous system: A&O x self and hospital. no gross focal neurologic deficits, normal speech Extremities: moves all, no edema, normal tone Skin: dry, intact, normal temperature Psychiatry: normal mood, congruent affect, abnormal judgement and insight due to apparent baseline cognitive impairement   Data Reviewed:  Notable labs --- K 5.2, glucose 138, Cr 1.48, Ca 8.7, GFR 33, iron 26, procal < 0.10, A1c 5.4%, TSH 6.219, lactate 1.1,   Family Communication: son and daughter-in-law at bedside  Disposition: Status is: Inpatient Remains inpatient appropriate because: on IV therapies as above.   Planned Discharge Destination: Home with Home Health vs SNF    Time spent: 45 minutes  Author: Ezekiel Slocumb, DO 07/04/2022 6:11 PM  For on call review [email or web address].

## 2022-07-04 NOTE — TOC Initial Note (Signed)
Transition of Care [facility]) - Initial/Assessment Note    Patient Details  Name: Stacy [name] MRN: [id number] Date of Birth: 10-10-1926  Transition of Care [facility]) CM/SW Contact:    Alberteen Sam, LCSW Phone Number: 07/04/2022, 3:51 PM  Clinical Narrative:                  CSW spoke with patient's daughter Ginger regarding SNF, reports they are in agreement with preference for [facility]. Referrals sent, pending bed offers.   Expected Discharge Plan: Skilled Nursing Facility Barriers to Discharge: Continued Medical Work up   Patient Goals and CMS Choice Patient states their goals for this hospitalization and ongoing recovery are:: to go home CMS Medicare.gov Compare Post Acute Care list provided to:: Patient Represenative (must comment) (daughter Ginger) Choice offered to / list presented to : Adult Children  Expected Discharge Plan and Services Expected Discharge Plan: [place] arrangements for the past 2 months: Single Family Home                                      Prior Living Arrangements/Services Living arrangements for the past 2 months: Single Family Home Lives with:: Self                   Activities of Daily Living Home Assistive Devices/Equipment: Environmental consultant (specify type) ADL Screening (condition at time of admission) Patient's cognitive ability adequate to safely complete daily activities?: Yes Is the patient deaf or have difficulty hearing?: No Does the patient have difficulty seeing, even when wearing glasses/contacts?: No Does the patient have difficulty concentrating, remembering, or making decisions?: No Patient able to express need for assistance with ADLs?: Yes Does the patient have difficulty dressing or bathing?: No Independently performs ADLs?: No Communication: Independent Dressing (OT): Needs assistance Is this a change from baseline?: Change from baseline, expected to last <3days Grooming:  Independent Feeding: Independent Bathing: Needs assistance Is this a change from baseline?: Change from baseline, expected to last <3 days Toileting: Needs assistance Is this a change from baseline?: Change from baseline, expected to last <3 days In/Out Bed: Dependent Is this a change from baseline?: Change from baseline, expected to last <3 days Walks in Home: Dependent Is this a change from baseline?: Change from baseline, expected to last <3 days Does the patient have difficulty walking or climbing stairs?: Yes Weakness of Legs: Both Weakness of Arms/Hands: None  Permission Sought/Granted                  Emotional Assessment              Admission diagnosis:  New onset atrial fibrillation ([place]) [I48.91] CHF exacerbation ([place]) [I50.9] Acute cystitis without hematuria [N30.00] Atrial fibrillation with RVR ([place]) [I48.91] Patient Active Problem List   Diagnosis Date Noted   Atrial fibrillation with RVR ([place]) 07/03/2022   CHF exacerbation ([place]) 07/03/2022   Dizziness 07/03/2022   Myocardial injury 07/03/2022   Stage 3a chronic kidney disease (CKD) ([place]) 07/03/2022   Normocytic anemia 07/03/2022   Left hip pain 07/03/2022   AAA (abdominal aortic aneurysm) without rupture ([place]) 06/15/2022   Malnutrition of moderate degree 03/13/2021   Hypomagnesemia 03/08/2021   AKI (acute kidney injury) ([place]) 03/08/2021   Chronic respiratory failure with hypoxia ([place]) 03/07/2021   Acute blood loss anemia 03/07/2021   Leukocytosis 03/07/2021  Acute colitis on CT 03/07/2021   Encounter for general adult medical examination with abnormal findings 07/05/2018   Neoplasm of uncertain behavior of skin of hand 07/05/2018   Simple chronic bronchitis ([place]) 12/31/2017   Congestive heart failure ([place]) 10/20/2017   Primary generalized (osteo)arthritis 09/21/2017   Vitamin B12 deficiency anemia, unspecified 09/21/2017   Chronic kidney disease, unspecified 09/21/2017   Vitamin D deficiency,  unspecified 09/21/2017   COPD (chronic obstructive pulmonary disease) ([place]) 09/21/2017   Unspecified hearing loss, bilateral 09/21/2017   Age-related osteoporosis without current pathological fracture 09/21/2017   Other amnesia 09/21/2017   Cough 09/21/2017   Allergic rhinitis, unspecified 09/21/2017   Other seborrheic dermatitis 09/21/2017   Unspecified cervical disc disorder, mid-cervical region, unspecified level 09/21/2017   Neuromuscular dysfunction of bladder, unspecified 09/21/2017   Allergic rhinitis due to pollen 09/21/2017   Other primary ovarian failure 09/21/2017   Shortness of breath 09/21/2017   Acute bronchitis, unspecified 09/21/2017   Benign and innocent cardiac murmurs 09/21/2017   Dysuria 09/21/2017   Retention of urine, unspecified 09/21/2017   Unspecified urinary incontinence 09/21/2017   Mixed incontinence 09/21/2017   Insomnia, unspecified 09/21/2017   Presyncope secondary to symptomatic anemia 09/21/2017   Polymyalgia rheumatica ([place]) 09/21/2017   Low back pain 09/21/2017   Hypoxemia 09/21/2017   Spondylosis without myelopathy or radiculopathy, lumbosacral region 09/21/2017   Pain in unspecified knee 09/21/2017   HLD (hyperlipidemia) 09/21/2017   Cardiomegaly 09/21/2017   Abnormality of gait and mobility 09/21/2017   Personal history of malignant neoplasm of breast 04/15/2013   History of breast cancer 12/24/2012   Incisional hernia, without obstruction or gangrene 12/24/2012   HTN (hypertension)    Vitiligo    Arthritis    Hypothyroidism    Bowel trouble    PCP:  Jonetta Osgood, NP Pharmacy:   [place], [place] - [address] [place] [address] Phone: [phone] Fax: [phone]  [facility] [place] [place] [address] Phone: [phone] Fax: [phone]  [facility] [id number], [place] [address] Phone: [phone] Fax: [phone]     Social Determinants of Health (SDOH) Interventions    Readmission Risk Interventions     No data to display

## 2022-07-04 NOTE — Evaluation (Signed)
Occupational Therapy Evaluation Patient Details Name: Stacy [name] MRN: [id number] DOB: [birth year] Today's Date: 07/04/2022   History of Present Illness Stacy [name] is a 88 y.o. female here with multiple complaints.  The patient's primary complaint is intermittent weakness, dizziness, and fatigue.  The patient also had some mild chest pressure intermittently.  The patient has reportedly been increasingly weak over the last several days and has gone from occasionally being able to scoot herself around the house to essentially immobile.  She states that she has felt very lightheaded intermittently.  She has felt some dizziness which she describes more as a lightheadedness.  No focal numbness or weakness.  Denies any known medication changes.  Denies any fevers or chills.   Clinical Impression   Patient presenting with decreased independence in self-care, functional mobility, safety, and endurance. Patient poor historian, son and daughter-in-law present in room and assisting in PLOF. Reporting patient lives with other son who expects her to take care of him after coming home from rehab. Patient uses rolling chair to ambulate around home, hallway and bathroom not WC accessible. Patient sponge bathes at baseline and requires assistance for bathing, dressing, and medication management. Son reports the patient and her son sleeps in late, so he is unsure if patient takes morning meds on time. Patient currently functioning at max A for LB dressing,  mod A for bed mobility and transfers. Patient stood at bed sit but unable to take any side steps. HR and BP monitored during session increasing to (HR)149  and (BP) 91/66 with activity Patient with no c/o dizziness. Patient will benefit from acute OT to increase overall independence in the areas of ADLs, functional mobility, in order to safely discharge to the next venue of care.       Recommendations for follow up therapy are one component of a multi-disciplinary  discharge planning process, led by the attending physician.  Recommendations may be updated based on patient status, additional functional criteria and insurance authorization.   Follow Up Recommendations  Skilled nursing-short term rehab (<3 hours/day)    Assistance Recommended at Discharge Frequent or constant Supervision/Assistance  Patient can return home with the following A lot of help with bathing/dressing/bathroom;A lot of help with walking and/or transfers;Direct supervision/assist for medications management;Direct supervision/assist for financial management;Assist for transportation;Assistance with cooking/housework;Help with stairs or ramp for entrance    Functional Status Assessment  Patient has had a recent decline in their functional status and/or demonstrates limited ability to make significant improvements in function in a reasonable and predictable amount of time  Equipment Recommendations  Other (comment) (Defer to next venue of care.)    Recommendations for Other Services       Precautions / Restrictions Precautions Precautions: Fall Restrictions Weight Bearing Restrictions: No      Mobility Bed Mobility Overal bed mobility: Needs Assistance Bed Mobility: Supine to Sit, Sit to Supine     Supine to sit: Mod assist Sit to supine: Mod assist        Transfers Overall transfer level: Needs assistance Equipment used: 1 person hand held assist Transfers: Sit to/from Stand Sit to Stand: Mod assist                  Balance Overall balance assessment: Needs assistance Sitting-balance support: No upper extremity supported, Feet supported Sitting balance-Leahy Scale: Fair     Standing balance support: Reliant on assistive device for balance, During functional activity, Bilateral upper extremity supported Standing balance-Leahy Scale: Poor  ADL either performed or assessed with clinical judgement   ADL Overall  ADL's : Needs assistance/impaired                     Lower Body Dressing: Maximal assistance                        Pertinent Vitals/Pain Pain Assessment Pain Assessment: No/denies pain        Extremity/Trunk Assessment Upper Extremity Assessment Upper Extremity Assessment: Generalized weakness   Lower Extremity Assessment Lower Extremity Assessment: Generalized weakness          Cognition Arousal/Alertness: Awake/alert Behavior During Therapy: WFL for tasks assessed/performed Overall Cognitive Status: History of cognitive impairments - at baseline                                                  Home Living Family/patient expects to be discharged to:: Private residence Living Arrangements: Children (Patient lives at home with her son, but he is not able to assist her. She takes care of him.) Available Help at Discharge: Available PRN/intermittently;Family Type of Home: House Home Access: Ramped entrance     Home Layout: One level     Bathroom Shower/Tub: Sponge bathes at [place]: Standard Bathroom Accessibility: No   Home Equipment: Rollator (4 wheels);Cane - single point          Prior Functioning/Environment Prior Level of Function : Needs assist       Physical Assist : ADLs (physical)   ADLs (physical): Grooming;Bathing;Dressing;Toileting;IADLs            OT Problem List: Decreased strength;Decreased activity tolerance;Decreased safety awareness;Impaired balance (sitting and/or standing);Decreased knowledge of use of DME or AE      OT Treatment/Interventions: Self-care/ADL training;Therapeutic exercise;Therapeutic activities;Energy conservation;Patient/family education;DME and/or AE instruction;Cognitive remediation/compensation    OT Goals(Current goals can be found in the care plan section) Acute Rehab OT Goals Patient Stated Goal: none stated OT Goal Formulation: With patient Time  For Goal Achievement: 07/18/22 Potential to Achieve Goals: Fair ADL Goals Pt Will Perform Grooming: sitting;with modified independence Pt Will Perform Lower Body Bathing: with mod assist Pt Will Perform Lower Body Dressing: with mod assist Pt Will Transfer to Toilet: with mod assist Pt Will Perform Toileting - Clothing Manipulation and hygiene: with mod assist  OT Frequency: Min 2X/week       AM-PAC OT "6 Clicks" Daily Activity     Outcome Measure Help from another person eating meals?: None Help from another person taking care of personal grooming?: A Little Help from another person toileting, which includes using toliet, bedpan, or urinal?: A Lot Help from another person bathing (including washing, rinsing, drying)?: A Lot Help from another person to put on and taking off regular upper body clothing?: A Little Help from another person to put on and taking off regular lower body clothing?: A Lot 6 Click Score: 16   End of Session Nurse Communication: Mobility status  Activity Tolerance: Treatment limited secondary to medical complications (Comment) Patient left: in bed;with call bell/phone within reach;with bed alarm set;with family/visitor present  OT Visit Diagnosis: Unsteadiness on feet (R26.81);Repeated falls (R29.6);Muscle weakness (generalized) (M62.81);History of falling (Z91.81);Other symptoms and signs involving cognitive function  Time: 9201-0071 OT Time Calculation (min): 33 min Charges:  OT General Charges $OT Visit: 1 Visit OT Evaluation $OT Eval Moderate Complexity: 1 Mod OT Treatments $Self Care/Home Management : 8-22 mins    Stacy [name] 07/04/2022, 1:48 PM

## 2022-07-05 ENCOUNTER — Telehealth (HOSPITAL_COMMUNITY): Payer: Self-pay | Admitting: Pharmacy Technician

## 2022-07-05 ENCOUNTER — Other Ambulatory Visit (HOSPITAL_COMMUNITY): Payer: Self-pay

## 2022-07-05 DIAGNOSIS — M353 Polymyalgia rheumatica: Secondary | ICD-10-CM | POA: Diagnosis not present

## 2022-07-05 DIAGNOSIS — N390 Urinary tract infection, site not specified: Secondary | ICD-10-CM | POA: Diagnosis present

## 2022-07-05 DIAGNOSIS — I5023 Acute on chronic systolic (congestive) heart failure: Secondary | ICD-10-CM | POA: Diagnosis not present

## 2022-07-05 DIAGNOSIS — I4891 Unspecified atrial fibrillation: Secondary | ICD-10-CM | POA: Diagnosis not present

## 2022-07-05 LAB — CBC
HCT: 29.8 % — ABNORMAL LOW (ref 36.0–46.0)
Hemoglobin: 9.1 g/dL — ABNORMAL LOW (ref 12.0–15.0)
MCH: 26.1 pg (ref 26.0–34.0)
MCHC: 30.5 g/dL (ref 30.0–36.0)
MCV: 85.6 fL (ref 80.0–100.0)
Platelets: 254 10*3/uL (ref 150–400)
RBC: 3.48 MIL/uL — ABNORMAL LOW (ref 3.87–5.11)
RDW: 15.8 % — ABNORMAL HIGH (ref 11.5–15.5)
WBC: 11 10*3/uL — ABNORMAL HIGH (ref 4.0–10.5)
nRBC: 0 % (ref 0.0–0.2)

## 2022-07-05 LAB — BASIC METABOLIC PANEL
Anion gap: 10 (ref 5–15)
BUN: 29 mg/dL — ABNORMAL HIGH (ref 8–23)
CO2: 27 mmol/L (ref 22–32)
Calcium: 8.9 mg/dL (ref 8.9–10.3)
Chloride: 101 mmol/L (ref 98–111)
Creatinine, Ser: 1.89 mg/dL — ABNORMAL HIGH (ref 0.44–1.00)
GFR, Estimated: 24 mL/min — ABNORMAL LOW (ref >60)
Glucose, Bld: 131 mg/dL — ABNORMAL HIGH (ref 70–99)
Potassium: 4.7 mmol/L (ref 3.5–5.1)
Sodium: 138 mmol/L (ref 135–145)

## 2022-07-05 LAB — URINE CULTURE: Culture: >=100000 — AB

## 2022-07-05 LAB — MAGNESIUM: Magnesium: 1.5 mg/dL — ABNORMAL LOW (ref 1.7–2.4)

## 2022-07-05 MED ORDER — METOPROLOL TARTRATE 25 MG PO TABS
12.5000 mg | ORAL_TABLET | Freq: Two times a day (BID) | ORAL | Status: DC
  Administered 2022-07-05 – 2022-07-06 (×3): 12.5 mg via ORAL
  Filled 2022-07-05 (×3): qty 1

## 2022-07-05 MED ORDER — CEFAZOLIN SODIUM-DEXTROSE 1-4 GM/50ML-% IV SOLN
1.0000 g | Freq: Two times a day (BID) | INTRAVENOUS | Status: DC
  Filled 2022-07-05: qty 50

## 2022-07-05 MED ORDER — MAGNESIUM SULFATE 4 GM/100ML IV SOLN
4.0000 g | Freq: Once | INTRAVENOUS | Status: AC
  Administered 2022-07-05: 4 g via INTRAVENOUS
  Filled 2022-07-05: qty 100

## 2022-07-05 NOTE — Progress Notes (Addendum)
Progress Note   Patient: Stacy [name] ZWC:[id number] DOB: March 11, 1927 DOA: 07/03/2022     3 DOS: the patient was seen and examined on 07/06/2022   Brief hospital course: Stacy [name] is a 88 y.o. female with medical history significant of chronic HFrEF, HTN, HLD, COPD, hypothyroidism, polymyalgia rheumatica on chronic prednisone, breast cancer (s/p of radiation therapy), CKD stage IIIa, colitis, AAA, obesity (now with BMI 27.46), who presented on 07/03/2022 for evaluation of dizziness and chest pain.  Pt is poor historian, family provided most history.  Pt is from home, minimally ambulatory at recent baseline.  She had a recent fall last week and had been reporting generalized weakness, and being dizzy/lightheaded at home for several days.   She was found to be in A-fib with RVR, apparently new onset.  Dr. Humphrey [name] of cardiology was consulted. Patient started on Cardizem infusion to get HR controlled.  Patient also found to have a UTI and started on empiric Rocephin pending urine culture results.    Assessment and Plan: * Acute on chronic HFrEF (heart failure with reduced ejection fraction) (HCC) Presented with SOB, leg edema, positive JVD, elevated BNP 531, clinically consistent with CHF decompensation, likely driven by A-fib with RVR. Echo 10/9 shows EF 45-50% --Cardiology following, see their recommendations --Started on IV Lasix 20 mg BID on admission --Stop IV Lasix today, Cr increased --Hold diuresis for today --Monitor I/O's daily weights --Montior & replace electrolytes --Mgmt per cardiology   Atrial fibrillation with RVR (Stacy [name]) Presented with new onset atrial fibrillation with heart rate 120-190. CHA2DS2-VASc Score is 5.  Ideally patient will need anticoagulants chronically.  Per her daughter, patient is very easy to bleed, particularly when patient was taking aspirin. Pt is also high falls risk. --On Cardizem drip since admission --Will start low dose metoprolol 12.5 mg bid --Wean off  Cardizem drip today, resume if HR's sustain > 110 bpm despite metop --Telemetry --cardiology consulted, Dr. Humphrey [name] - has signed off. Outpatient follow up 10/16 10am ----deferred anticoagulation on admission pending risk/benefit discussions given her frequent falls.  --Eliquis has been started by cardiology   Myocardial injury Mild troponin elevation due to demand ischemia with A-fib RVR and decompensated CHF.  --aspirin allergy noted --EKG and repeat troponin if pt has active chest pain  Dizziness Most likely due to atrial fibrillation with RVR, heart rate up to 190. CT-hjead negative. -Fall  precaution -PT/OT  HLD (hyperlipidemia) Not on medications.   Lipid profile: total chol 117, LDL 63, HDL low 38, TG's 82 --Mgmt per cardiology  COPD (chronic obstructive pulmonary disease) (HCC) Stable -Bronchodilators  HTN (hypertension) - IV hydralazine as needed -Patient is on IV Lasix  Hypomagnesemia Magnesium 1.3 on admission was replaced.   Mg 1.8>>1.5 today, recurrent. --further replacement ordered --Monitor Mg level, goal >2.0, replace PRN  Hypothyroidism Patient is not taking medications currently. TSH mildly elevated 6.219, but in setting of acute illness. --defer to primary care  Polymyalgia rheumatica (HCC) -Continue prednisone, increased dose from 5 to 10 mg daily -Continue Stacy [name] -Solu-Medrol 40 mg as stress dose x1  Stage 3a chronic kidney disease (CKD) (HCC) Slightly worsened than baseline.  Baseline creatinine 1.0-1.2.  Her creatinine is 1.43, BUN 20. -Follow-up renal function closely by BMP  Normocytic anemia Hemoglobin 8.9 (9.7 on 6/9/202 2), slightly dropped Anemia panel shows mild iron deficiency, normal B12 and folate  AAA (abdominal aortic aneurysm) without rupture (HCC) - Follow-up with PCP  Left hip pain Avascular necrosis of left hip - due to  chronic prednisone most likely Presented with complaints of left hip pain and left upper leg pain,  unclear but per family seems chronic, or acute on chronic since her most recent fall. -As needed Tylenol -As needed Norco -PT/OT -family report pt declined hip replacement years ago  UTI (urinary tract infection) Due to Klebsiella pneumonia Started on empiric Rocephin. Urine culture grew Klebsiella pneumoniae resistant to ampicillin. Can de-escalate to Ancef.  AKI (acute kidney injury) ([place]) Superimposed on CKD stage 3a. AKI due to diuresis. Cr on admission 1.48 near baseline. Cr up to 1.89 today --Stop diuresis --Repeat BMP tomorrow --Resume oral diuretic when improved (vs PRN for weight gain/edema)        Subjective: Pt seen on rounds this AM, no family visiting at the time. Pt reports feeling better.  Denies fever/chills, chest pain, sob or other complaints.  She talks about wanting to return home with her son, so he won't be alone, that they take care of each other.    Physical Exam: Vitals:   07/05/22 2319 07/06/22 0447 07/06/22 0755 07/06/22 1157  BP: 93/61  121/78 (!) 81/58  Pulse: 83  91 95  Resp: '18  18 18  '$ Temp: 97.9 F (36.6 C)  97.7 F (36.5 C) 98 F (36.7 C)  TempSrc:      SpO2: 99%  100% 92%  Weight:  73.2 kg    Height:       General exam: awake, alert, no acute distress, frail HEENT: atraumatic, clear conjunctiva, anicteric sclera, moist mucus membranes, hearing grossly normal  Respiratory system: clear lungs, diminished bases, normal respiratory effort. Cardiovascular system: normal S1/S2, irregularly irregular, no peripheral edema.   Gastrointestinal system: soft, NT, ND Central nervous system: A&O x self and hospital. no gross focal neurologic deficits, normal speech Extremities: moves all, no edema, normal tone Psychiatry: normal mood, congruent affect, abnormal judgement and insight due to apparent baseline cognitive impairment   Data Reviewed:  Notable labs --- K normalized 4.7 from 5.2, glucose 131, Cr 1.89 up from 1.48 Mg 1.5   Family  Communication: son and daughter-in-law at bedside 10/9. None present today, will attempt to call  Disposition: Status is: Inpatient Remains inpatient appropriate because: on IV Cardizem, IV antibiotic pending cultures   Planned Discharge Destination: SNF      Time spent: 45 minutes  Author: Ezekiel Slocumb, DO 07/06/2022 2:02 PM  For on call review [email or web address].

## 2022-07-05 NOTE — TOC Progression Note (Signed)
Transition of Care [facility]) - Progression Note    Patient Details  Name: SHAHD OCCHIPINTI MRN: [id number] Date of Birth: 1927-03-19  Transition of Care [facility]) CM/SW [place], [place] Phone Number: 07/05/2022, 2:45 PM  Clinical Narrative:     Per patient's daughter Ginger, she reports after discussing with patient and family, that discharge plan will be for patient to return home with max [facility] services through [facility]. Corene Cornea with [facility] informed.   Expected Discharge Plan: [place] Barriers to Discharge: Continued Medical Work up  Expected Discharge Plan and Services Expected Discharge Plan: [place] arrangements for the past 2 months: Single Family Home                                       Social Determinants of Health (SDOH) Interventions    Readmission Risk Interventions     No data to display

## 2022-07-05 NOTE — TOC Benefit Eligibility Note (Signed)
Patient Teacher, English as a foreign language completed.    The patient is currently admitted and upon discharge could be taking Eliquis 2.5 mg.  The current 30 day co-pay is $417.01 due to a $365.31 deductible remaining.   The patient is insured through [place], [place] Patient Advocate Specialist [place] Patient Advocate Team Direct Number: [phone]  Fax: [phone]

## 2022-07-05 NOTE — Progress Notes (Signed)
   Heart Failure Nurse Navigator Note   Attempted to met with patient and family to discuss heart failure.  There were no family members at the bedside.  Will attempt at another time.  Pricilla Riffle RN CHFN

## 2022-07-05 NOTE — Assessment & Plan Note (Addendum)
Started on empiric Rocephin. Urine culture grew Klebsiella pneumoniae resistant to ampicillin. Can de-escalate to Ancef.

## 2022-07-05 NOTE — TOC Progression Note (Signed)
Transition of Care [facility]) - Progression Note    Patient Details  Name: Stacy [name] MRN: [id number] Date of Birth: 31-May-1927  Transition of Care [facility]) CM/SW [place], [place] Phone Number: 07/05/2022, 10:28 AM  Clinical Narrative:     CSW spoke with patient's daughter Ginger, provided bed offers, chose [facility]. Magda Paganini at [facility] updated and MD updated, pending medical readiness to dc to [facility].   Expected Discharge Plan: [place] Barriers to Discharge: Continued Medical Work up  Expected Discharge Plan and Services Expected Discharge Plan: [place] arrangements for the past 2 months: Single Family Home                                       Social Determinants of Health (SDOH) Interventions    Readmission Risk Interventions     No data to display

## 2022-07-05 NOTE — Progress Notes (Signed)
Physical Therapy Treatment Patient Details Name: TYLIYAH MCMEEKIN MRN: [id number] DOB: 02/18/1927 Today's Date: 07/05/2022   History of Present Illness MATY ZEISLER is a 88 y.o. female here with multiple complaints.  The patient's primary complaint is intermittent weakness, dizziness, and fatigue.  The patient also had some mild chest pressure intermittently.  The patient has reportedly been increasingly weak over the last several days and has gone from occasionally being able to scoot herself around the house to essentially immobile.  She states that she has felt very lightheaded intermittently.  She has felt some dizziness which she describes more as a lightheadedness.  No focal numbness or weakness.  Denies any known medication changes.  Denies any fevers or chills.    PT Comments    Pt is making gradual progress towards goals with ability to perform lateral transfer from bed->recliner. HR still increases to 130s with exertion and pt still requires physical assist to perform. Not at baseline at this time. Family in room and educated on safe transfers as pt appears to have limited insight into functional deficits. Still recommending SNF for continuation and progression towards goals. Will continue to progress.   Recommendations for follow up therapy are one component of a multi-disciplinary discharge planning process, led by the attending physician.  Recommendations may be updated based on patient status, additional functional criteria and insurance authorization.  Follow Up Recommendations  Skilled nursing-short term rehab (<3 hours/day) Can patient physically be transported by private vehicle: No   Assistance Recommended at Discharge Frequent or constant Supervision/Assistance  Patient can return home with the following A lot of help with bathing/dressing/bathroom;A lot of help with walking and/or transfers;Help with stairs or ramp for entrance   Equipment Recommendations  None recommended by PT     Recommendations for Other Services       Precautions / Restrictions Precautions Precautions: Fall Restrictions Weight Bearing Restrictions: No     Mobility  Bed Mobility Overal bed mobility: Needs Assistance Bed Mobility: Supine to Sit, Sit to Supine     Supine to sit: Mod assist     General bed mobility comments: needs assist with B LEs in addition to trunkal elevation. Needs mod assist for scooting out towards EOB. Once seated at EOB, upright posture noted.    Transfers Overall transfer level: Needs assistance Equipment used: 1 person hand held assist Transfers: Bed to chair/wheelchair/BSC            Lateral/Scoot Transfers: Mod assist General transfer comment: needs assist for hand placement. Becomes flustered with lines/leads with cues for sequencing. Drop arm of recliner used and pt able to scoot several times over to recliner. Once in recliner min assist for repositioning    Ambulation/Gait               General Gait Details: not performed at baseline   Stairs             Wheelchair Mobility    Modified Rankin (Stroke Patients Only)       Balance Overall balance assessment: Needs assistance Sitting-balance support: No upper extremity supported, Feet supported Sitting balance-Leahy Scale: Fair                                      Cognition Arousal/Alertness: Awake/alert Behavior During Therapy: WFL for tasks assessed/performed Overall Cognitive Status: History of cognitive impairments - at baseline  General Comments: pleasant and agreeable to session, limited insight into functional deficits        Exercises      General Comments        Pertinent Vitals/Pain Pain Assessment Pain Assessment: No/denies pain    Home Living                          Prior Function            PT Goals (current goals can now be found in the care plan section) Acute  Rehab PT Goals Patient Stated Goal: to go home PT Goal Formulation: With patient Time For Goal Achievement: 07/18/22 Potential to Achieve Goals: Good Progress towards PT goals: Progressing toward goals    Frequency    Min 2X/week      PT Plan Current plan remains appropriate    Co-evaluation              AM-PAC PT "6 Clicks" Mobility   Outcome Measure  Help needed turning from your back to your side while in a flat bed without using bedrails?: A Little Help needed moving from lying on your back to sitting on the side of a flat bed without using bedrails?: A Lot Help needed moving to and from a bed to a chair (including a wheelchair)?: A Lot Help needed standing up from a chair using your arms (e.g., wheelchair or bedside chair)?: Total Help needed to walk in hospital room?: Total Help needed climbing 3-5 steps with a railing? : Total 6 Click Score: 10    End of Session   Activity Tolerance: Patient limited by fatigue Patient left: in chair;with chair alarm set;with family/visitor present Nurse Communication: Mobility status PT Visit Diagnosis: Unsteadiness on feet (R26.81);Muscle weakness (generalized) (M62.81);Difficulty in walking, not elsewhere classified (R26.2)     Time: 1542-1600 PT Time Calculation (min) (ACUTE ONLY): 18 min  Charges:  $Therapeutic Activity: 8-22 mins                     Greggory Stallion, PT, DPT, GCS [phone]    Teon Hudnall 07/05/2022, 4:36 PM

## 2022-07-05 NOTE — Telephone Encounter (Signed)
Pharmacy Patient Advocate Encounter  Insurance verification completed.    The patient is insured through [employer] Part D   The patient is currently admitted and ran test claims for the following: Eliquis .  Copays and coinsurance results were relayed to Inpatient clinical team.      

## 2022-07-05 NOTE — Progress Notes (Signed)
SUBJECTIVE: Stacy [name] is a 88 y.o. female with medical history significant of CHF (listed on medical problem list), HTN, HLD, COPD, hypothyroidism, polymyalgia rheumatica, breast cancer (s/p of radiation therapy), CKD stage IIIa, colitis, AAA,  who presented to the ED on 07/03/22 with dizziness and chest pain.   Patient found to be in new onset atrial fibrillation.    Vitals:   07/04/22 2309 07/05/22 0254 07/05/22 0338 07/05/22 0809  BP: 102/73  (!) 110/90 121/79  Pulse: 91  79 92  Resp: '18  18 18  '$ Temp: 98.2 F (36.8 C)  97.6 F (36.4 C) 97.8 F (36.6 C)  TempSrc: Oral  Oral Oral  SpO2: 99%  97% 97%  Weight:  72.8 kg    Height:        Intake/Output Summary (Last 24 hours) at 07/05/2022 0909 Last data filed at 07/05/2022 0700 Gross per 24 hour  Intake 616.59 ml  Output 500 ml  Net 116.59 ml    LABS: Basic Metabolic Panel: Recent Labs    07/03/22 1456 07/04/22 0557 07/05/22 0413  NA  --  139 138  K  --  5.2* 4.7  CL  --  107 101  CO2  --  23 27  GLUCOSE  --  138* 131*  BUN  --  21 29*  CREATININE  --  1.48* 1.89*  CALCIUM  --  8.7* 8.9  MG  --  1.8 1.5*  PHOS 3.3  --   --    Liver Function Tests: No results for input(s): "AST", "ALT", "ALKPHOS", "BILITOT", "PROT", "ALBUMIN" in the last 72 hours. No results for input(s): "LIPASE", "AMYLASE" in the last 72 hours. CBC: Recent Labs    07/03/22 1245 07/05/22 0413  WBC 8.3 11.0*  HGB 8.9* 9.1*  HCT 29.9* 29.8*  MCV 87.7 85.6  PLT 250 254   Cardiac Enzymes: No results for input(s): "CKTOTAL", "CKMB", "CKMBINDEX", "TROPONINI" in the last 72 hours. BNP: Invalid input(s): "POCBNP" D-Dimer: No results for input(s): "DDIMER" in the last 72 hours. Hemoglobin A1C: Recent Labs    07/03/22 1539  HGBA1C 5.4   Fasting Lipid Panel: Recent Labs    07/03/22 1456  CHOL 117  HDL 38*  LDLCALC 63  TRIG 82  CHOLHDL 3.1   Thyroid Function Tests: Recent Labs    07/03/22 1456  TSH 6.219*   Anemia  Panel: Recent Labs    07/03/22 1456 07/03/22 2330 07/04/22 0557  VITAMINB12  --   --  669  FOLATE >40.0  --   --   FERRITIN 16  --   --   TIBC 300  --   --   IRON 26*  --   --   RETICCTPCT  --  1.5  --      PHYSICAL EXAM General: Well developed, well nourished, in no acute distress HEENT:  Normocephalic and atramatic Neck:  No JVD.  Lungs: Clear bilaterally to auscultation and percussion. Heart: HRRR . Normal S1 and S2 without gallops or murmurs.  Abdomen: Bowel sounds are positive, abdomen soft and non-tender  Msk:  Back normal, normal gait. Normal strength and tone for age. Extremities: No clubbing, cyanosis or edema.   Neuro: Alert and oriented X 3. Psych:  Good affect, responds appropriately  TELEMETRY: atrial fibrillation, HR 81 bpm  ASSESSMENT AND PLAN: Patient poor historian. Denies chest pain, shortness of breath improved. Heart rate improved. Echo revealed EF 45-50%. Amiodarone and low dose Eliquis started yesterday for stroke prevention. Patient can  be discharged today with follow up in office on Monday 10/16 at 10:00 am.   Principal Problem:   Acute on chronic HFrEF (heart failure with reduced ejection fraction) (HCC) Active Problems:   HTN (hypertension)   Hypothyroidism   COPD (chronic obstructive pulmonary disease) (HCC)   Polymyalgia rheumatica (HCC)   HLD (hyperlipidemia)   Hypomagnesemia   AAA (abdominal aortic aneurysm) without rupture (HCC)   Atrial fibrillation with RVR (HCC)   Dizziness   Myocardial injury   Stage 3a chronic kidney disease (CKD) (HCC)   Normocytic anemia   Left hip pain    Omara Alcon, FNP-C 07/05/2022 9:09 AM

## 2022-07-05 NOTE — Assessment & Plan Note (Signed)
Superimposed on CKD stage 3a. AKI due to diuresis. Cr on admission 1.48 near baseline. Cr up to 1.89 today --Stop diuresis --Repeat BMP tomorrow --Resume oral diuretic when improved (vs PRN for weight gain/edema)

## 2022-07-05 NOTE — Consult Note (Signed)
Pharmacy Antibiotic Note  Stacy [name] is a 88 y.o. female with PMH including HFrEF, HTN, HLD, COPD, hypothyroidism, polymyalgia rheumatica on chronic prednisone, breast cancer (s/p radiation therapy), CKD, colitis, AAA, obesity admitted on 07/03/2022 with acute on chronic HFrEF, Afib with RVR, myocardial injury, UTI. Patient empirically treated with ceftriaxone. Based on urine culture results, patient is now being de-escalated to cefazolin. Pharmacy has been consulted for cefazolin dosing.  Plan:  Cefazolin 1 g IV q12h  Height: '5\' 4"'$  (162.6 cm) Weight: 72.8 kg (160 lb 6.4 oz) IBW/kg (Calculated) : 54.7  Temp (24hrs), Avg:98.2 F (36.8 C), Min:97.6 F (36.4 C), Max:98.7 F (37.1 C)  Recent Labs  Lab 07/03/22 1245 07/03/22 1539 07/04/22 0557 07/05/22 0413  WBC 8.3  --   --  11.0*  CREATININE 1.43*  --  1.48* 1.89*  LATICACIDVEN  --  1.1  --   --     Estimated Creatinine Clearance: 18.2 mL/min (A) (by C-G formula based on SCr of 1.89 mg/dL (H)).    Allergies  Allergen Reactions   Aspirin Itching and Other (See Comments)    Dizzy/passing out   Augmentin [Amoxicillin-Pot Clavulanate]    Codeine Other (See Comments)    dizziness   Doxycycline Hyclate Other (See Comments)   Sulfamethoxazole-Trimethoprim Diarrhea   Tape Itching   Vitamin D Analogs    Azithromycin Rash and Hives    Antimicrobials this admission: Ceftriaxone 10/8 >> 10/10 Cefazolin 10/11 >>   Dose adjustments this admission: N/A  Microbiology results: 10/8 UCx: K pneumo (Amp R)    Thank you for allowing pharmacy to be a part of this patient's care.  Benita Gutter 07/05/2022 6:16 PM

## 2022-07-06 DIAGNOSIS — I5023 Acute on chronic systolic (congestive) heart failure: Secondary | ICD-10-CM | POA: Diagnosis not present

## 2022-07-06 LAB — CBC
HCT: 27.9 % — ABNORMAL LOW (ref 36.0–46.0)
Hemoglobin: 8.6 g/dL — ABNORMAL LOW (ref 12.0–15.0)
MCH: 26.5 pg (ref 26.0–34.0)
MCHC: 30.8 g/dL (ref 30.0–36.0)
MCV: 86.1 fL (ref 80.0–100.0)
Platelets: 226 10*3/uL (ref 150–400)
RBC: 3.24 MIL/uL — ABNORMAL LOW (ref 3.87–5.11)
RDW: 15.7 % — ABNORMAL HIGH (ref 11.5–15.5)
WBC: 8.8 10*3/uL (ref 4.0–10.5)
nRBC: 0 % (ref 0.0–0.2)

## 2022-07-06 LAB — BASIC METABOLIC PANEL
Anion gap: 5 (ref 5–15)
BUN: 27 mg/dL — ABNORMAL HIGH (ref 8–23)
CO2: 27 mmol/L (ref 22–32)
Calcium: 8.5 mg/dL — ABNORMAL LOW (ref 8.9–10.3)
Chloride: 107 mmol/L (ref 98–111)
Creatinine, Ser: 1.8 mg/dL — ABNORMAL HIGH (ref 0.44–1.00)
GFR, Estimated: 26 mL/min — ABNORMAL LOW (ref >60)
Glucose, Bld: 79 mg/dL (ref 70–99)
Potassium: 4.2 mmol/L (ref 3.5–5.1)
Sodium: 139 mmol/L (ref 135–145)

## 2022-07-06 LAB — MAGNESIUM: Magnesium: 2.6 mg/dL — ABNORMAL HIGH (ref 1.7–2.4)

## 2022-07-06 MED ORDER — CEPHALEXIN 500 MG PO CAPS: 500.0000 mg | ORAL_CAPSULE | Freq: Two times a day (BID) | ORAL | 0 refills | Status: AC

## 2022-07-06 MED ORDER — APIXABAN 2.5 MG PO TABS: 2.5000 mg | ORAL_TABLET | Freq: Two times a day (BID) | ORAL | 1 refills | Status: DC

## 2022-07-06 MED ORDER — METOPROLOL TARTRATE 25 MG PO TABS: 12.5000 mg | ORAL_TABLET | Freq: Two times a day (BID) | ORAL | 1 refills | Status: DC

## 2022-07-06 MED ORDER — AMIODARONE HCL 200 MG PO TABS: 200.0000 mg | ORAL_TABLET | Freq: Every day | ORAL | 1 refills | Status: DC

## 2022-07-06 NOTE — Care Management Important Message (Signed)
Important Message  Patient Details  Name: Stacy [name] MRN: [id number] Date of Birth: Aug 13, 1927   Medicare Important Message Given:  Yes     Dannette Barbara 07/06/2022, 11:02 AM

## 2022-07-06 NOTE — Plan of Care (Signed)

## 2022-07-06 NOTE — TOC Transition Note (Signed)
Transition of Care [facility]) - CM/SW Discharge Note   Patient Details  Name: Stacy [name] MRN: [id number] Date of Birth: 1927-05-24  [facility] CM/SW Contact:  Colen Darling, [place] Phone Number: 07/06/2022, 3:33 PM   Clinical Narrative:     Patient has orders to discharge home today. [place] liaison is aware. Called son and confirmed address on facesheet is correct. EMS transport has been arranged and she is second on the list. No further concerns. CSW signing off.   Final next level of care: [place] Barriers to Discharge: Barriers Resolved   Patient Goals and CMS Choice Patient states their goals for this hospitalization and ongoing recovery are:: to go home CMS Medicare.gov Compare Post Acute Care list provided to:: Patient Choice offered to / list presented to : Adult Children  Discharge Placement                Patient to be transferred to facility by: [facility] Name of family member notified: Gene Meegan Patient and family notified of of transfer: 07/06/22  Discharge Plan and Services                          HH Arranged: PT, Nurse's Aide, OT, Social Work, Engineer, materials Agency: [place] ([facility])     Representative spoke with at [place]: [place] (SDOH) Interventions     Readmission Risk Interventions     No data to display

## 2022-07-06 NOTE — Discharge Summary (Signed)
Stacy [name] HGD:[id number] DOB: [birth year] DOA: 07/03/2022  PCP: Jonetta Osgood, NP  Admit date: 07/03/2022 Discharge date: 07/06/2022  Time spent: 35 minutes  Recommendations for Outpatient Follow-up:  Cardiology f/u 10/16 BMP to assess kidney function at f/u     Discharge Diagnoses:  Principal Problem:   Acute on chronic HFrEF (heart failure with reduced ejection fraction) ([place]) Active Problems:   Atrial fibrillation with RVR (HCC)   Myocardial injury   Dizziness   COPD (chronic obstructive pulmonary disease) (HCC)   HLD (hyperlipidemia)   HTN (hypertension)   Hypomagnesemia   Hypothyroidism   Polymyalgia rheumatica (HCC)   Stage 3a chronic kidney disease (CKD) (HCC)   Normocytic anemia   AAA (abdominal aortic aneurysm) without rupture (HCC)   Left hip pain   AKI (acute kidney injury) ([place])   UTI (urinary tract infection)   Discharge Condition: stable  Diet recommendation: heart healthy  Filed Weights   07/03/22 1246 07/05/22 0254 07/06/22 0447  Weight: 72.6 kg 72.8 kg 73.2 kg    History of present illness:  From admission h and p  Stacy [name] is a 88 y.o. female with medical history significant of CHF (listed on medical problem list), HTN, HLD, COPD, hypothyroidism, polymyalgia rheumatica, breast cancer (s/p of radiation therapy), CKD stage IIIa, colitis, AAA, obesity with a BMI 27.46, who presents with dizziness and chest pain.   Patient is a very poor historian, I called her daughter by phone, who provided some information's.  History is limited.   It seems that the patient has weakness, lightheadedness and dizziness for several days.  Her daughter states that patient fell last week.  She moves all extremities, no facial droop or slurred speech.  Patient states that she has left-sided chest pain, dry cough and some shortness of breath.  She cannot give detailed information about her chest pain.  She denies symptoms of UTI.  Does not have active nausea, vomiting,  diarrhea or abdominal pain.  She complains of left hip pain and left upper leg pain. Daughter states that patient is very easy to bleed particularly when she was taking aspirin.   Patient was found to have a new onset atrial fibrillation, with heart rate 120-190s per report.  Cardizem drip is started in ED.    Hospital Course:  Found to be in a fib with rvr. Treated with diltiazem gtt, transitioned to metoprolol and amiodarone. Cardiology Humphrey Rolls) consulted. Also started on eliquis. Also treated for klebsiella uti with cephalosporin, transition to oral keflex at discharge. Diuresed with lasix but lasix held after creatinine up-trended. Discharge cr somewhat improved to 1.8 but still a bit above baseline of around 1.3 so will need check of kidney function at cardiology or pcp f/u. PT advised SNF but patient and family elect home with home health. Other chronic medical problems stable.   Procedures: none   Consultations: cardiology  Discharge Exam: Vitals:   07/05/22 2319 07/06/22 0755  BP: 93/61 121/78  Pulse: 83 91  Resp: 18 18  Temp: 97.9 F (36.6 C) 97.7 F (36.5 C)  SpO2: 99% 100%    General: NAD Cardiovascular: irreg irreg, normal rate Respiratory: CTAB Ext: warm, no edema  Discharge Instructions   Discharge Instructions     Diet - low sodium heart healthy   Complete by: As directed    Face-to-face encounter (required for Medicare/Medicaid patients)   Complete by: As directed    I Desma Maxim certify that this patient is under my care and  that I, or a nurse practitioner or physician's assistant working with me, had a face-to-face encounter that meets the physician face-to-face encounter requirements with this patient on 07/06/2022. The encounter with the patient was in whole, or in part for the following medical condition(s) which is the primary reason for home health care (List medical condition): a fib, advanced age   The encounter with the patient was in whole, or in  part, for the following medical condition, which is the primary reason for home health care: a fib   I certify that, based on my findings, the following services are medically necessary home health services: Physical therapy   Reason for Medically Necessary Home Health Services: Therapy- Therapeutic Exercises to Increase Strength and Endurance   My clinical findings support the need for the above services: Unsafe ambulation due to balance issues   Further, I certify that my clinical findings support that this patient is homebound due to: Pain interferes with ambulation/mobility   Home Health   Complete by: As directed    To provide the following care/treatments:  PT OT Home Health Aide     Increase activity slowly   Complete by: As directed       Allergies as of 07/06/2022       Reactions   Aspirin Itching, Other (See Comments)   Dizzy/passing out   Augmentin [amoxicillin-pot Clavulanate]    Codeine Other (See Comments)   dizziness   Doxycycline Hyclate Other (See Comments)   Sulfamethoxazole-trimethoprim Diarrhea   Tape Itching   Vitamin D Analogs    Azithromycin Rash, Hives        Medication List     TAKE these medications    amiodarone 200 MG tablet Commonly known as: PACERONE Take 1 tablet (200 mg total) by mouth daily. Start taking on: July 07, 2022   apixaban 2.5 MG Tabs tablet Commonly known as: ELIQUIS Take 1 tablet (2.5 mg total) by mouth 2 (two) times daily.   Calcium 600+D3 Plus Minerals 600-800 MG-UNIT Chew Chew 1 tablet by mouth as directed.   cephALEXin 500 MG capsule Commonly known as: KEFLEX Take 1 capsule (500 mg total) by mouth 2 (two) times daily for 6 days.   gabapentin 100 MG capsule Commonly known as: NEURONTIN Take 2 tabs 3 x day for neuropathy by mouth What changed:  how much to take how to take this when to take this additional instructions   HYDROcodone-acetaminophen 5-325 MG tablet Commonly known as: Norco Take 0.5-1  tablets by mouth 2 (two) times daily as needed for moderate pain or severe pain. What changed:  how much to take when to take this additional instructions   hydroxychloroquine 200 MG tablet Commonly known as: PLAQUENIL Take 1 tablet by mouth twice a day    send to [facility] What changed:  how much to take how to take this when to take this additional instructions   lidocaine 4 % Place 1 patch onto the skin daily as needed (pain).   metoprolol tartrate 25 MG tablet Commonly known as: LOPRESSOR Take 0.5 tablets (12.5 mg total) by mouth 2 (two) times daily.   multivitamin with minerals Tabs tablet Take 1 tablet by mouth daily.   pantoprazole 40 MG tablet Commonly known as: PROTONIX TAKE 1 TABLET TWICE DAILY   predniSONE 10 MG tablet Commonly known as: DELTASONE Take 1 tablet (10 mg total) by mouth daily with breakfast. What changed: how much to take       Allergies  Allergen Reactions  Aspirin Itching and Other (See Comments)    Dizzy/passing out   Augmentin [Amoxicillin-Pot Clavulanate]    Codeine Other (See Comments)    dizziness   Doxycycline Hyclate Other (See Comments)   Sulfamethoxazole-Trimethoprim Diarrhea   Tape Itching   Vitamin D Analogs    Azithromycin Rash and Hives    Follow-up Information     Dionisio David, MD Follow up.   Specialty: Cardiology Why: on 10/16 Contact information: [place] [address] [phone]                  The results of significant diagnostics from this hospitalization (including imaging, microbiology, ancillary and laboratory) are listed below for reference.    Significant Diagnostic Studies: ECHOCARDIOGRAM COMPLETE  Result Date: 07/04/2022    ECHOCARDIOGRAM REPORT   Patient Name:   FRANCETTA ILG Date of Exam: 07/04/2022 Medical Rec #:  [id number]   Height:       64.0 in Accession #:    [id number]  Weight:       160.0 lb Date of Birth:  1927/07/13    BSA:          1.779 m Patient Age:    3  years    BP:           104/63 mmHg Patient Gender: F           HR:           105 bpm. Exam Location:  [facility] Procedure: 2D Echo, Cardiac Doppler and Color Doppler Indications:     I50.31 CHF Acute Diastolic  History:         Patient has no prior history of Echocardiogram examinations.                  CHF, COPD, Arrythmias:Atrial Fibrillation; Risk                  Factors:Hypertension.  Sonographer:     Rosalia Hammers Referring Phys:  [id number] [place] Diagnosing Phys: Neoma Laming  Sonographer Comments: Suboptimal apical window. IMPRESSIONS  1. Left ventricular ejection fraction, by estimation, is 45 to 50%. The left ventricle has mildly decreased function. The left ventricle demonstrates global hypokinesis. The left ventricular internal cavity size was mildly dilated. There is moderate concentric left ventricular hypertrophy. Left ventricular diastolic parameters are consistent with Grade I diastolic dysfunction (impaired relaxation).  2. Right ventricular systolic function is moderately reduced. The right ventricular size is moderately enlarged. Mildly increased right ventricular wall thickness.  3. Left atrial size was severely dilated.  4. Right atrial size was severely dilated.  5. The mitral valve is abnormal. Moderate mitral valve regurgitation.  6. The tricuspid valve is abnormal. Tricuspid valve regurgitation is moderate to severe.  7. The aortic valve is calcified. Aortic valve regurgitation is mild to moderate. Moderate to severe aortic valve stenosis. FINDINGS  Left Ventricle: Left ventricular ejection fraction, by estimation, is 45 to 50%. The left ventricle has mildly decreased function. The left ventricle demonstrates global hypokinesis. The left ventricular internal cavity size was mildly dilated. There is  moderate concentric left ventricular hypertrophy. Left ventricular diastolic parameters are consistent with Grade I diastolic dysfunction (impaired relaxation). Right Ventricle: The right  ventricular size is moderately enlarged. Mildly increased right ventricular wall thickness. Right ventricular systolic function is moderately reduced. Left Atrium: Left atrial size was severely dilated. Right Atrium: Right atrial size was severely dilated. Pericardium: There is no evidence of pericardial effusion. Mitral  Valve: The mitral valve is abnormal. There is severe calcification of the mitral valve leaflet(s). Mildly decreased mobility of the mitral valve leaflets. Moderate mitral valve regurgitation. Tricuspid Valve: The tricuspid valve is abnormal. Tricuspid valve regurgitation is moderate to severe. Aortic Valve: The aortic valve is calcified. Aortic valve regurgitation is mild to moderate. Moderate to severe aortic stenosis is present. Aortic valve mean gradient measures 21.7 mmHg. Aortic valve peak gradient measures 35.6 mmHg. Aortic valve area, by VTI measures 0.49 cm. Pulmonic Valve: The pulmonic valve was grossly normal. Pulmonic valve regurgitation is trivial. Aorta: The aortic root, ascending aorta and aortic arch are all structurally normal, with no evidence of dilitation or obstruction. IAS/Shunts: No atrial level shunt detected by color flow Doppler.  LEFT VENTRICLE PLAX 2D LVIDd:         4.80 cm   Diastology LVIDs:         3.60 cm   LV e' medial:    11.20 cm/s LV PW:         1.10 cm   LV E/e' medial:  7.4 LV IVS:        1.00 cm   LV e' lateral:   16.90 cm/s LVOT diam:     1.80 cm   LV E/e' lateral: 4.9 LV SV:         28 LV SV Index:   16 LVOT Area:     2.54 cm  RIGHT VENTRICLE RV Basal diam:  3.50 cm RV Mid diam:    2.70 cm RV S prime:     10.10 cm/s TAPSE (M-mode): 1.3 cm LEFT ATRIUM              Index        RIGHT ATRIUM           Index LA diam:        3.10 cm  1.74 cm/m   RA Area:     25.30 cm LA Vol (A2C):   112.0 ml 62.94 ml/m  RA Volume:   80.00 ml  44.96 ml/m LA Vol (A4C):   73.8 ml  41.48 ml/m LA Biplane Vol: 93.1 ml  52.32 ml/m  AORTIC VALVE                     PULMONIC VALVE  AV Area (Vmax):    0.54 cm      PR End Diast Vel: 2.88 msec AV Area (Vmean):   0.50 cm AV Area (VTI):     0.49 cm AV Vmax:           298.33 cm/s AV Vmean:          220.000 cm/s AV VTI:            0.571 m AV Peak Grad:      35.6 mmHg AV Mean Grad:      21.7 mmHg LVOT Vmax:         63.30 cm/s LVOT Vmean:        42.800 cm/s LVOT VTI:          0.109 m LVOT/AV VTI ratio: 0.19  AORTA Ao Root diam: 3.10 cm MITRAL VALVE                TRICUSPID VALVE MV Area (PHT): 7.02 cm     TR Peak grad:   26.6 mmHg MV Decel Time: 108 msec     TR Vmax:        258.00 cm/s MV E velocity:  82.60 cm/s MV A velocity: 122.00 cm/s  SHUNTS MV E/A ratio:  0.68         Systemic VTI:  0.11 m                             Systemic Diam: 1.80 cm Neoma Laming Electronically signed by Neoma Laming Signature Date/Time: 07/04/2022/11:44:58 AM    Final    DG FEMUR MIN 2 VIEWS LEFT  Result Date: 07/03/2022 CLINICAL DATA:  Injury EXAM: LEFT FEMUR 2 VIEWS COMPARISON:  None FINDINGS: Osseous demineralization. Knee joint alignment normal which joint space narrowing seen. Collapse of LEFT femoral head suspect underlying avascular necrosis Secondary degenerative changes of the LEFT hip joint. No fracture or dislocation. IMPRESSION: Osseous demineralization with probable avascular necrosis changes of LEFT femoral head with partial collapse and secondary degenerative changes at the LEFT hip joint. No acute abnormalities. Electronically Signed   By: Lavonia Dana M.D.   On: 07/03/2022 19:49   DG HIP UNILAT WITH PELVIS 2-3 VIEWS LEFT  Result Date: 07/03/2022 CLINICAL DATA:  LEFT hip injury EXAM: DG HIP (WITH OR WITHOUT PELVIS) 2-3V LEFT COMPARISON:  None FINDINGS: Osseous demineralization. Flattening of the femoral head with mixed sclerosis and lucency likely reflecting avascular necrosis and partial collapse. Secondary degenerative changes of LEFT hip joint. No acute fracture, dislocation, or bone destruction. Narrowing of RIGHT hip joint space. Degenerative  facet disease changes lower lumbar spine. Foci of soft tissue gas extending into the inferior RIGHT pelvis/RIGHT inguinal region question RIGHT inguinal versus ventral hernia. IMPRESSION: Suspected avascular necrosis of the LEFT femoral head with partial collapse and advanced secondary degenerative changes. No acute fracture or dislocation. Question RIGHT inguinal or ventral hernia; recommend correlation with physical exam. Electronically Signed   By: Lavonia Dana M.D.   On: 07/03/2022 19:48   CT HEAD WO CONTRAST (5MM)  Result Date: 07/03/2022 CLINICAL DATA:  Head trauma, moderate to severe. Dizziness over the last 2 days. EXAM: CT HEAD WITHOUT CONTRAST TECHNIQUE: Contiguous axial images were obtained from the base of the skull through the vertex without intravenous contrast. RADIATION DOSE REDUCTION: This exam was performed according to the departmental dose-optimization program which includes automated exposure control, adjustment of the mA and/or kV according to patient size and/or use of iterative reconstruction technique. COMPARISON:  CT head 05/27/2022. FINDINGS: Brain: There is no evidence of acute intracranial hemorrhage, mass lesion, brain edema or extra-axial fluid collection. Stable mild atrophy with mild prominence of the ventricles and subarachnoid spaces. Stable mild chronic small vessel ischemic changes within the periventricular white matter and basal ganglia. There is no CT evidence of acute cortical infarction. Vascular: Intracranial vascular calcifications. No hyperdense vessel identified. Skull: Negative for fracture or focal lesion. Sinuses/Orbits: Minimal ethmoid sinus mucosal thickening, stable. The additional visualized paranasal sinuses, mastoid air cells and middle ears are clear. No significant orbital findings. Other: None. IMPRESSION: 1. Stable head CT without acute intracranial findings. 2. Stable mild atrophy and chronic small vessel ischemic changes. Electronically Signed   By:  Richardean Sale M.D.   On: 07/03/2022 14:15   DG Chest Portable 1 View  Result Date: 07/03/2022 CLINICAL DATA:  Chest pain with dizziness for 2 days. EXAM: PORTABLE CHEST 1 VIEW COMPARISON:  Radiographs 03/07/2021 and 05/12/2015.  CT 11/01/2004. FINDINGS: 1328 hours. The heart size and mediastinal contours are stable with borderline cardiomegaly and aortic atherosclerosis. Probable mild scarring and pleural thickening at both lung bases. No superimposed edema,  confluent airspace opacity, pleural effusion or pneumothorax. Advanced glenohumeral degenerative changes are present bilaterally without evidence of acute osseous abnormality. Telemetry leads overlie the chest. IMPRESSION: No evidence of acute cardiopulmonary process. Chronic findings as above. Electronically Signed   By: Richardean Sale M.D.   On: 07/03/2022 13:42    Microbiology: Recent Results (from the past 240 hour(s))  Urine Culture     Status: Abnormal   Collection Time: 07/03/22  2:27 PM   Specimen: Urine, Clean Catch  Result Value Ref Range Status   Specimen Description   Final    URINE, CLEAN CATCH Performed at [facility], [address], [place], [place] [address]    Special Requests   Final    NONE Performed at [facility], [place],  [address]    Culture >=100,000 COLONIES/mL KLEBSIELLA PNEUMONIAE (A)  Final   Report Status 07/05/2022 FINAL  Final   Organism ID, Bacteria KLEBSIELLA PNEUMONIAE (A)  Final      Susceptibility   Klebsiella pneumoniae - MIC*    AMPICILLIN RESISTANT Resistant     CEFAZOLIN <=4 SENSITIVE Sensitive     CEFEPIME <=0.12 SENSITIVE Sensitive     CEFTRIAXONE <=0.25 SENSITIVE Sensitive     CIPROFLOXACIN <=0.25 SENSITIVE Sensitive     GENTAMICIN <=1 SENSITIVE Sensitive     IMIPENEM <=0.25 SENSITIVE Sensitive     NITROFURANTOIN 32 SENSITIVE Sensitive     TRIMETH/SULFA <=20 SENSITIVE Sensitive     AMPICILLIN/SULBACTAM <=2 SENSITIVE Sensitive      PIP/TAZO <=4 SENSITIVE Sensitive     * >=100,000 COLONIES/mL KLEBSIELLA PNEUMONIAE     Labs: Basic Metabolic Panel: Recent Labs  Lab 07/03/22 1245 07/03/22 1456 07/04/22 0557 07/05/22 0413 07/06/22 0451  NA 139  --  139 138 139  K 3.9  --  5.2* 4.7 4.2  CL 105  --  107 101 107  CO2 27  --  '23 27 27  '$ GLUCOSE 88  --  138* 131* 79  BUN 20  --  21 29* 27*  CREATININE 1.43*  --  1.48* 1.89* 1.80*  CALCIUM 8.4*  --  8.7* 8.9 8.5*  MG 1.3*  --  1.8 1.5* 2.6*  PHOS  --  3.3  --   --   --    Liver Function Tests: No results for input(s): "AST", "ALT", "ALKPHOS", "BILITOT", "PROT", "ALBUMIN" in the last 168 hours. No results for input(s): "LIPASE", "AMYLASE" in the last 168 hours. No results for input(s): "AMMONIA" in the last 168 hours. CBC: Recent Labs  Lab 07/03/22 1245 07/05/22 0413 07/06/22 0451  WBC 8.3 11.0* 8.8  HGB 8.9* 9.1* 8.6*  HCT 29.9* 29.8* 27.9*  MCV 87.7 85.6 86.1  PLT 250 254 226   Cardiac Enzymes: No results for input(s): "CKTOTAL", "CKMB", "CKMBINDEX", "TROPONINI" in the last 168 hours. BNP: BNP (last 3 results) Recent Labs    07/03/22 1245  BNP 531.2*    ProBNP (last 3 results) No results for input(s): "PROBNP" in the last 8760 hours.  CBG: No results for input(s): "GLUCAP" in the last 168 hours.     Signed:  Desma Maxim MD.  [facility] 07/06/2022, 11:08 AM

## 2022-07-06 NOTE — Consult Note (Signed)
   Heart Failure Nurse Navigator Note  HFmrEf 45-50% moderate concentric LVH.  Grade 1 diastolic dysfunction.  Right ventricular systolic function mildly reduced.  Severe biatrial enlargement.  Moderate to severe tricuspid regurgitation.  Moderate to severe aortic stenosis.  She presented to the emergency room with complaints of dizziness, chest pain, lower extremity edema.  Comorbidities:  Hypertension Hyperlipidemia COPD Hypothyroidism Polymyalgia rheumatica Chronic kidney disease stage III Obesity Status post radiation therapy for breast cancer  Had been unable to speak with family members at the bedside.  Received message that son had some questions.  Call place to Gene, the patients son.  Discussed heart failure, he states he is familiar with the term in relationship to his mother, she has had it in the past.  Talked about daily weights- he states it is impossible to weigh her daily.  Went over changes signs and symptoms, he states most of what was listed they had noted prior to admission.  Went over low sodium and need for fluid restriction.  He voices understanding to keep intake less than 64 ounces.  He also asked that her heart failure appointment be changed as she has a cardiology appointment on 10/16.  Her heart failure appointment was changed to 10/30 at 3 PM.  Given instructions.  He had no further questions.  Pricilla Riffle RN [facility]

## 2022-07-06 NOTE — Progress Notes (Signed)
SUBJECTIVE: Stacy [name] is a 88 y.o. female with medical history significant of CHF (listed on medical problem list), HTN, HLD, COPD, hypothyroidism, polymyalgia rheumatica, breast cancer (s/p of radiation therapy), CKD stage IIIa, colitis, AAA,  who presented to the ED on 07/03/22 with dizziness and chest pain.   Patient found to be in new onset atrial fibrillation.    Vitals:   07/05/22 2008 07/05/22 2319 07/06/22 0447 07/06/22 0755  BP: 100/70 93/61  121/78  Pulse: 64 83  91  Resp: '20 18  18  '$ Temp: 98.3 F (36.8 C) 97.9 F (36.6 C)  97.7 F (36.5 C)  TempSrc:      SpO2: 97% 99%  100%  Weight:   73.2 kg   Height:        Intake/Output Summary (Last 24 hours) at 07/06/2022 1008 Last data filed at 07/06/2022 0449 Gross per 24 hour  Intake 386.16 ml  Output 1000 ml  Net -613.84 ml    LABS: Basic Metabolic Panel: Recent Labs    07/03/22 1456 07/04/22 0557 07/05/22 0413 07/06/22 0451  NA  --    < > 138 139  K  --    < > 4.7 4.2  CL  --    < > 101 107  CO2  --    < > 27 27  GLUCOSE  --    < > 131* 79  BUN  --    < > 29* 27*  CREATININE  --    < > 1.89* 1.80*  CALCIUM  --    < > 8.9 8.5*  MG  --    < > 1.5* 2.6*  PHOS 3.3  --   --   --    < > = values in this interval not displayed.   Liver Function Tests: No results for input(s): "AST", "ALT", "ALKPHOS", "BILITOT", "PROT", "ALBUMIN" in the last 72 hours. No results for input(s): "LIPASE", "AMYLASE" in the last 72 hours. CBC: Recent Labs    07/05/22 0413 07/06/22 0451  WBC 11.0* 8.8  HGB 9.1* 8.6*  HCT 29.8* 27.9*  MCV 85.6 86.1  PLT 254 226   Cardiac Enzymes: No results for input(s): "CKTOTAL", "CKMB", "CKMBINDEX", "TROPONINI" in the last 72 hours. BNP: Invalid input(s): "POCBNP" D-Dimer: No results for input(s): "DDIMER" in the last 72 hours. Hemoglobin A1C: Recent Labs    07/03/22 1539  HGBA1C 5.4   Fasting Lipid Panel: Recent Labs    07/03/22 1456  CHOL 117  HDL 38*  LDLCALC 63  TRIG 82   CHOLHDL 3.1   Thyroid Function Tests: Recent Labs    07/03/22 1456  TSH 6.219*   Anemia Panel: Recent Labs    07/03/22 1456 07/03/22 2330 07/04/22 0557  VITAMINB12  --   --  669  FOLATE >40.0  --   --   FERRITIN 16  --   --   TIBC 300  --   --   IRON 26*  --   --   RETICCTPCT  --  1.5  --      PHYSICAL EXAM General: Well developed, well nourished, in no acute distress HEENT:  Normocephalic and atramatic Neck:  No JVD.  Lungs: Clear bilaterally to auscultation and percussion. Heart: HRRR . Normal S1 and S2 without gallops or murmurs.  Abdomen: Bowel sounds are positive, abdomen soft and non-tender  Msk:  Back normal, normal gait. Normal strength and tone for age. Extremities: No clubbing, cyanosis or edema.   Neuro:  Alert and oriented X 3. Psych:  Good affect, responds appropriately  TELEMETRY: atrial fibrillation  ASSESSMENT AND PLAN: Patient poor historian. Denies chest pain, shortness of breath . Echo revealed EF 45-50%. Continue amiodarone and low dose Eliquis for stroke prevention. Patient can be discharged today with follow up in office on Monday 10/16 at 10:00 am.   Principal Problem:   Acute on chronic HFrEF (heart failure with reduced ejection fraction) (HCC) Active Problems:   HTN (hypertension)   Hypothyroidism   COPD (chronic obstructive pulmonary disease) (HCC)   Polymyalgia rheumatica (HCC)   HLD (hyperlipidemia)   Hypomagnesemia   AKI (acute kidney injury) ([place])   AAA (abdominal aortic aneurysm) without rupture (HCC)   Atrial fibrillation with RVR (HCC)   Dizziness   Myocardial injury   Stage 3a chronic kidney disease (CKD) (HCC)   Normocytic anemia   Left hip pain   UTI (urinary tract infection)    [name], FNP-C 07/06/2022 10:08 AM

## 2022-07-07 DIAGNOSIS — M150 Primary generalized (osteo)arthritis: Secondary | ICD-10-CM | POA: Diagnosis not present

## 2022-07-07 DIAGNOSIS — Z7952 Long term (current) use of systemic steroids: Secondary | ICD-10-CM | POA: Diagnosis not present

## 2022-07-07 DIAGNOSIS — E079 Disorder of thyroid, unspecified: Secondary | ICD-10-CM | POA: Diagnosis not present

## 2022-07-07 DIAGNOSIS — Z79891 Long term (current) use of opiate analgesic: Secondary | ICD-10-CM | POA: Diagnosis not present

## 2022-07-07 DIAGNOSIS — Z9981 Dependence on supplemental oxygen: Secondary | ICD-10-CM | POA: Diagnosis not present

## 2022-07-07 DIAGNOSIS — W19XXXD Unspecified fall, subsequent encounter: Secondary | ICD-10-CM | POA: Diagnosis not present

## 2022-07-07 DIAGNOSIS — Z9181 History of falling: Secondary | ICD-10-CM | POA: Diagnosis not present

## 2022-07-07 DIAGNOSIS — J449 Chronic obstructive pulmonary disease, unspecified: Secondary | ICD-10-CM | POA: Diagnosis not present

## 2022-07-07 DIAGNOSIS — Z7969 Long term (current) use of other immunomodulators and immunosuppressants: Secondary | ICD-10-CM | POA: Diagnosis not present

## 2022-07-07 DIAGNOSIS — M6281 Muscle weakness (generalized): Secondary | ICD-10-CM | POA: Diagnosis not present

## 2022-07-07 DIAGNOSIS — R296 Repeated falls: Secondary | ICD-10-CM | POA: Diagnosis not present

## 2022-07-07 DIAGNOSIS — Z853 Personal history of malignant neoplasm of breast: Secondary | ICD-10-CM | POA: Diagnosis not present

## 2022-07-07 DIAGNOSIS — J9611 Chronic respiratory failure with hypoxia: Secondary | ICD-10-CM | POA: Diagnosis not present

## 2022-07-07 DIAGNOSIS — I509 Heart failure, unspecified: Secondary | ICD-10-CM | POA: Diagnosis not present

## 2022-07-07 DIAGNOSIS — M353 Polymyalgia rheumatica: Secondary | ICD-10-CM | POA: Diagnosis not present

## 2022-07-07 DIAGNOSIS — M545 Low back pain, unspecified: Secondary | ICD-10-CM | POA: Diagnosis not present

## 2022-07-07 DIAGNOSIS — R262 Difficulty in walking, not elsewhere classified: Secondary | ICD-10-CM | POA: Diagnosis not present

## 2022-07-07 DIAGNOSIS — I110 Hypertensive heart disease with heart failure: Secondary | ICD-10-CM | POA: Diagnosis not present

## 2022-07-08 ENCOUNTER — Telehealth: Payer: Self-pay

## 2022-07-08 NOTE — Telephone Encounter (Signed)
Lmom to Stacy [name] from [facility] for verbal order we call back again Monday [phone]

## 2022-07-09 DIAGNOSIS — W19XXXD Unspecified fall, subsequent encounter: Secondary | ICD-10-CM | POA: Diagnosis not present

## 2022-07-09 DIAGNOSIS — J9611 Chronic respiratory failure with hypoxia: Secondary | ICD-10-CM | POA: Diagnosis not present

## 2022-07-09 DIAGNOSIS — I110 Hypertensive heart disease with heart failure: Secondary | ICD-10-CM | POA: Diagnosis not present

## 2022-07-09 DIAGNOSIS — J449 Chronic obstructive pulmonary disease, unspecified: Secondary | ICD-10-CM | POA: Diagnosis not present

## 2022-07-09 DIAGNOSIS — I509 Heart failure, unspecified: Secondary | ICD-10-CM | POA: Diagnosis not present

## 2022-07-09 DIAGNOSIS — M545 Low back pain, unspecified: Secondary | ICD-10-CM | POA: Diagnosis not present

## 2022-07-11 ENCOUNTER — Telehealth: Payer: Self-pay

## 2022-07-11 DIAGNOSIS — I10 Essential (primary) hypertension: Secondary | ICD-10-CM | POA: Diagnosis not present

## 2022-07-11 DIAGNOSIS — I4891 Unspecified atrial fibrillation: Secondary | ICD-10-CM | POA: Diagnosis not present

## 2022-07-11 DIAGNOSIS — R0602 Shortness of breath: Secondary | ICD-10-CM | POA: Diagnosis not present

## 2022-07-11 DIAGNOSIS — I509 Heart failure, unspecified: Secondary | ICD-10-CM | POA: Diagnosis not present

## 2022-07-11 DIAGNOSIS — I2510 Atherosclerotic heart disease of native coronary artery without angina pectoris: Secondary | ICD-10-CM | POA: Diagnosis not present

## 2022-07-11 DIAGNOSIS — E782 Mixed hyperlipidemia: Secondary | ICD-10-CM | POA: Diagnosis not present

## 2022-07-11 NOTE — Telephone Encounter (Signed)
Gave verbal order to [facility]   physical  therapy [phone] 1 week for 1 week ,2 week 2  and 1 week 2

## 2022-07-12 ENCOUNTER — Ambulatory Visit: Payer: Medicare Other | Admitting: Family

## 2022-07-12 DIAGNOSIS — W19XXXD Unspecified fall, subsequent encounter: Secondary | ICD-10-CM | POA: Diagnosis not present

## 2022-07-12 DIAGNOSIS — J9611 Chronic respiratory failure with hypoxia: Secondary | ICD-10-CM | POA: Diagnosis not present

## 2022-07-12 DIAGNOSIS — M545 Low back pain, unspecified: Secondary | ICD-10-CM | POA: Diagnosis not present

## 2022-07-12 DIAGNOSIS — J449 Chronic obstructive pulmonary disease, unspecified: Secondary | ICD-10-CM | POA: Diagnosis not present

## 2022-07-12 DIAGNOSIS — I110 Hypertensive heart disease with heart failure: Secondary | ICD-10-CM | POA: Diagnosis not present

## 2022-07-12 DIAGNOSIS — I509 Heart failure, unspecified: Secondary | ICD-10-CM | POA: Diagnosis not present

## 2022-07-13 ENCOUNTER — Telehealth: Payer: Self-pay

## 2022-07-13 DIAGNOSIS — M545 Low back pain, unspecified: Secondary | ICD-10-CM | POA: Diagnosis not present

## 2022-07-13 DIAGNOSIS — I110 Hypertensive heart disease with heart failure: Secondary | ICD-10-CM | POA: Diagnosis not present

## 2022-07-13 DIAGNOSIS — J9611 Chronic respiratory failure with hypoxia: Secondary | ICD-10-CM | POA: Diagnosis not present

## 2022-07-13 DIAGNOSIS — I509 Heart failure, unspecified: Secondary | ICD-10-CM | POA: Diagnosis not present

## 2022-07-13 DIAGNOSIS — J449 Chronic obstructive pulmonary disease, unspecified: Secondary | ICD-10-CM | POA: Diagnosis not present

## 2022-07-13 DIAGNOSIS — W19XXXD Unspecified fall, subsequent encounter: Secondary | ICD-10-CM | POA: Diagnosis not present

## 2022-07-13 NOTE — Telephone Encounter (Signed)
[facility] [phone] called that Bp was 76/60 and then 78/60 and no dizziness  pt was just discharge from hospital and also cardiologist on 07/11/22 as per dr Latricia Heft advised nurse that she need call cardiology if cannot hold by cardiologist pt need [place] back to ED due to low BP

## 2022-07-14 ENCOUNTER — Encounter: Payer: Self-pay | Admitting: Nurse Practitioner

## 2022-07-14 ENCOUNTER — Telehealth (INDEPENDENT_AMBULATORY_CARE_PROVIDER_SITE_OTHER): Payer: Medicare Other | Admitting: Nurse Practitioner

## 2022-07-14 VITALS — BP 108/64 | HR 73 | Temp 97.7°F | Ht 64.5 in

## 2022-07-14 DIAGNOSIS — M150 Primary generalized (osteo)arthritis: Secondary | ICD-10-CM | POA: Diagnosis not present

## 2022-07-14 DIAGNOSIS — I509 Heart failure, unspecified: Secondary | ICD-10-CM | POA: Diagnosis not present

## 2022-07-14 DIAGNOSIS — J449 Chronic obstructive pulmonary disease, unspecified: Secondary | ICD-10-CM | POA: Diagnosis not present

## 2022-07-14 DIAGNOSIS — Z760 Encounter for issue of repeat prescription: Secondary | ICD-10-CM

## 2022-07-14 DIAGNOSIS — M545 Low back pain, unspecified: Secondary | ICD-10-CM | POA: Diagnosis not present

## 2022-07-14 DIAGNOSIS — J9611 Chronic respiratory failure with hypoxia: Secondary | ICD-10-CM

## 2022-07-14 DIAGNOSIS — W19XXXD Unspecified fall, subsequent encounter: Secondary | ICD-10-CM | POA: Diagnosis not present

## 2022-07-14 DIAGNOSIS — I110 Hypertensive heart disease with heart failure: Secondary | ICD-10-CM | POA: Diagnosis not present

## 2022-07-14 NOTE — Progress Notes (Signed)
[facility] [place], [place] [address]  Internal MEDICINE  Telephone Visit  Patient Name: Stacy [name]  [id number]  [id number]  Date of Service: 07/14/2022  I connected with the patient at 1323 by telephone and verified the patients identity using two identifiers.   I discussed the limitations, risks, security and privacy concerns of performing an evaluation and management service by telephone and the availability of in person appointments. I also discussed with the patient that there may be a patient responsible charge related to the service.  The patient expressed understanding and agrees to proceed.    Chief Complaint  Patient presents with   Telephone Screen    [phone]   Telephone Assessment    HPI Porter presents for a telehealth virtual visit for hospital follow up. Recent hospital stay for atrial fibrillation Called yesterday with low BP, was told she needed to call her cardiologist or go to ER. Neither suggestion was pursued Shaky and SOB with transferring. physical Therapy is coming to the home  Feet are looking better Extremities are always cold Has home health coming out to see patient, son is caregiver at home.  Has everything she needs in place.  Needed some medication/pharmacy changes due to cost   Current Medication: Outpatient Encounter Medications as of 07/14/2022  Medication Sig   amiodarone (PACERONE) 200 MG tablet Take 1 tablet (200 mg total) by mouth daily.   apixaban (ELIQUIS) 2.5 MG TABS tablet Take 1 tablet (2.5 mg total) by mouth 2 (two) times daily.   Calcium Carbonate-Vit D-Min (CALCIUM 600+D3 PLUS MINERALS) 600-800 MG-UNIT CHEW Chew 1 tablet by mouth as directed.   gabapentin (NEURONTIN) 100 MG capsule Take 2 tabs 3 x day for neuropathy by mouth (Patient taking differently: Take 100-200 mg by mouth See admin instructions. Take 2 capsules ('200mg'$ ) by mouth every morning, take 1 capsule ('100mg'$ ) by mouth at midday and take 2  capsules ('200mg'$ ) by mouth at bedtime)   lidocaine 4 % Place 1 patch onto the skin daily as needed (pain).   metoprolol tartrate (LOPRESSOR) 25 MG tablet Take 0.5 tablets (12.5 mg total) by mouth 2 (two) times daily.   Multiple Vitamin (MULTIVITAMIN WITH MINERALS) TABS tablet Take 1 tablet by mouth daily.   pantoprazole (PROTONIX) 40 MG tablet TAKE 1 TABLET TWICE DAILY   predniSONE (DELTASONE) 10 MG tablet Take 1 tablet (10 mg total) by mouth daily with breakfast. (Patient taking differently: Take 5 mg by mouth daily with breakfast.)   [DISCONTINUED] HYDROcodone-acetaminophen (NORCO) 5-325 MG tablet Take 0.5-1 tablets by mouth 2 (two) times daily as needed for moderate pain or severe pain. (Patient taking differently: Take 0.5 tablets by mouth See admin instructions. Take  tablet by mouth three times daily - may take additional  tablet daily if needed for pain)   [DISCONTINUED] hydroxychloroquine (PLAQUENIL) 200 MG tablet Take 1 tablet by mouth twice a day    send to [facility] (Patient taking differently: Take 200 mg by mouth 2 (two) times daily.)   HYDROcodone-acetaminophen (NORCO) 5-325 MG tablet Take 0.5-1 tablets by mouth 2 (two) times daily as needed for moderate pain or severe pain.   hydroxychloroquine (PLAQUENIL) 200 MG tablet Take 1 tablet (200 mg total) by mouth 2 (two) times daily.   No facility-administered encounter medications on file as of 07/14/2022.    Surgical History: Past Surgical History:  Procedure Laterality Date   BREAST BIOPSY Left 2006   +   BREAST SURGERY Left 2006  lumpectomy   [place]   COLON SURGERY  2004   COLONOSCOPY  2014   Dr. Candace Cruise   EYE SURGERY  2002   [place]  2013   left breast biopsy   2011    Medical History: Past Medical History:  Diagnosis Date   Arthritis 1940   Asthma    Bowel trouble    Cancer ([place]) 2006   DCIS left breast   Hypertension 1940   Incisional hernia 2014   Personal history of  radiation therapy    Polymyalgia ([place])    Thyroid disorder    Vitiligo 2012    Family History: Family History  Problem Relation Age of Onset   Heart disease Mother    Emphysema Mother    Arthritis Father    Cancer Father    Breast cancer Neg Hx     Social History   Socioeconomic History   Marital status: Married    Spouse name: Not on file   Number of children: Not on file   Years of education: Not on file   Highest education level: Not on file  Occupational History   Not on file  Tobacco Use   Smoking status: Never   Smokeless tobacco: Never  Vaping Use   Vaping Use: Never used  Substance and Sexual Activity   Alcohol use: No   Drug use: No   Sexual activity: Not on file  Other Topics Concern   Not on file  Social History Narrative   Not on file   Social Determinants of Health   Financial Resource Strain: Low Risk  (04/07/2021)   Overall Financial Resource Strain (CARDIA)    Difficulty of Paying Living Expenses: Not very hard  Food Insecurity: Not on file  Transportation Needs: Not on file  Physical Activity: Not on file  Stress: Not on file  Social Connections: Not on file  Intimate Partner Violence: Not on file      Review of Systems  Constitutional:  Positive for activity change and fatigue.  Respiratory:  Negative for cough, chest tightness, shortness of breath and wheezing.   Cardiovascular: Negative.  Negative for chest pain and palpitations.  Gastrointestinal: Negative.   Musculoskeletal:  Positive for arthralgias, back pain and gait problem.  Skin: Negative.   Neurological:  Positive for weakness.  Hematological:  Bruises/bleeds easily.    Vital Signs: BP 108/64   Pulse 73 Comment: 96  Temp 97.7 F (36.5 C)   Ht 5' 4.5" (1.638 m)   SpO2 100%   BMI 27.27 kg/m    Observation/Objective: Patient is alert and engages in conversation over telephone. She is at baseline.caregiver, her son is with her and also helps to answer questions  regarding her care. Overall she is doing well and will follow up again in a few months.     Assessment/Plan: 1. Chronic respiratory failure with hypoxia (HCC) No significant change. Stable, follow up in a few months and also as needed.   2. Primary generalized (osteo)arthritis Manageable with current medications, continue as prescribed.   3. Medication refill - hydroxychloroquine (PLAQUENIL) 200 MG tablet; Take 1 tablet (200 mg total) by mouth 2 (two) times daily.  Dispense: 180 tablet; Refill: 1 - HYDROcodone-acetaminophen (NORCO) 5-325 MG tablet; Take 0.5-1 tablets by mouth 2 (two) times daily as needed for moderate pain or severe pain.  Dispense: 60 tablet; Refill: 0   General Counseling: Lafawn verbalizes understanding of the findings of today's  phone visit and agrees with plan of treatment. I have discussed any further diagnostic evaluation that may be needed or ordered today. We also reviewed her medications today. she has been encouraged to call the office with any questions or concerns that should arise related to todays visit.  Return in about 3 months (around 10/14/2022) for F/U, Bartolo Montanye PCP.   No orders of the defined types were placed in this encounter.   Meds ordered this encounter  Medications   hydroxychloroquine (PLAQUENIL) 200 MG tablet    Sig: Take 1 tablet (200 mg total) by mouth 2 (two) times daily.    Dispense:  180 tablet    Refill:  1   HYDROcodone-acetaminophen (NORCO) 5-325 MG tablet    Sig: Take 0.5-1 tablets by mouth 2 (two) times daily as needed for moderate pain or severe pain.    Dispense:  60 tablet    Refill:  0    Time spent:20 Minutes Time spent with patient included reviewing progress notes, labs, imaging studies, and discussing plan for follow up.  [place] Controlled Substance Database was reviewed by me for overdose risk score (ORS) if appropriate.  This patient was seen by Jonetta Osgood, FNP-C in collaboration with Dr. Clayborn Bigness as a part of  collaborative care agreement.  Lillie Portner R. Valetta Fuller, MSN, FNP-C Internal medicine

## 2022-07-15 ENCOUNTER — Encounter: Payer: Self-pay | Admitting: Nurse Practitioner

## 2022-07-15 DIAGNOSIS — I110 Hypertensive heart disease with heart failure: Secondary | ICD-10-CM | POA: Diagnosis not present

## 2022-07-15 DIAGNOSIS — M545 Low back pain, unspecified: Secondary | ICD-10-CM | POA: Diagnosis not present

## 2022-07-15 DIAGNOSIS — W19XXXD Unspecified fall, subsequent encounter: Secondary | ICD-10-CM | POA: Diagnosis not present

## 2022-07-15 DIAGNOSIS — I509 Heart failure, unspecified: Secondary | ICD-10-CM | POA: Diagnosis not present

## 2022-07-15 DIAGNOSIS — J9611 Chronic respiratory failure with hypoxia: Secondary | ICD-10-CM | POA: Diagnosis not present

## 2022-07-15 DIAGNOSIS — J449 Chronic obstructive pulmonary disease, unspecified: Secondary | ICD-10-CM | POA: Diagnosis not present

## 2022-07-15 MED ORDER — HYDROXYCHLOROQUINE SULFATE 200 MG PO TABS: 200.0000 mg | ORAL_TABLET | Freq: Two times a day (BID) | ORAL | 1 refills | Status: DC

## 2022-07-15 MED ORDER — HYDROCODONE-ACETAMINOPHEN 5-325 MG PO TABS: 0.5000 | ORAL_TABLET | Freq: Two times a day (BID) | ORAL | 0 refills | Status: DC | PRN

## 2022-07-18 DIAGNOSIS — I509 Heart failure, unspecified: Secondary | ICD-10-CM | POA: Diagnosis not present

## 2022-07-18 DIAGNOSIS — J9611 Chronic respiratory failure with hypoxia: Secondary | ICD-10-CM | POA: Diagnosis not present

## 2022-07-18 DIAGNOSIS — J449 Chronic obstructive pulmonary disease, unspecified: Secondary | ICD-10-CM | POA: Diagnosis not present

## 2022-07-18 DIAGNOSIS — M545 Low back pain, unspecified: Secondary | ICD-10-CM | POA: Diagnosis not present

## 2022-07-18 DIAGNOSIS — W19XXXD Unspecified fall, subsequent encounter: Secondary | ICD-10-CM | POA: Diagnosis not present

## 2022-07-18 DIAGNOSIS — I110 Hypertensive heart disease with heart failure: Secondary | ICD-10-CM | POA: Diagnosis not present

## 2022-07-19 ENCOUNTER — Telehealth: Payer: Self-pay

## 2022-07-19 DIAGNOSIS — J9611 Chronic respiratory failure with hypoxia: Secondary | ICD-10-CM | POA: Diagnosis not present

## 2022-07-19 DIAGNOSIS — W19XXXD Unspecified fall, subsequent encounter: Secondary | ICD-10-CM | POA: Diagnosis not present

## 2022-07-19 DIAGNOSIS — J449 Chronic obstructive pulmonary disease, unspecified: Secondary | ICD-10-CM | POA: Diagnosis not present

## 2022-07-19 DIAGNOSIS — M545 Low back pain, unspecified: Secondary | ICD-10-CM | POA: Diagnosis not present

## 2022-07-19 DIAGNOSIS — I110 Hypertensive heart disease with heart failure: Secondary | ICD-10-CM | POA: Diagnosis not present

## 2022-07-19 DIAGNOSIS — I509 Heart failure, unspecified: Secondary | ICD-10-CM | POA: Diagnosis not present

## 2022-07-19 NOTE — Telephone Encounter (Signed)
[facility] samantha that pt dropping her BP 96/60 and 94/66 and 108/64 as per alyssa advised her that need to call cardiologist and if no response she can go to ED

## 2022-07-25 ENCOUNTER — Ambulatory Visit: Payer: Medicare Other | Admitting: Family

## 2022-07-25 ENCOUNTER — Telehealth: Payer: Self-pay | Admitting: Nurse Practitioner

## 2022-07-25 ENCOUNTER — Encounter (INDEPENDENT_AMBULATORY_CARE_PROVIDER_SITE_OTHER): Payer: Self-pay

## 2022-07-25 DIAGNOSIS — I509 Heart failure, unspecified: Secondary | ICD-10-CM | POA: Diagnosis not present

## 2022-07-25 DIAGNOSIS — W19XXXD Unspecified fall, subsequent encounter: Secondary | ICD-10-CM | POA: Diagnosis not present

## 2022-07-25 DIAGNOSIS — J449 Chronic obstructive pulmonary disease, unspecified: Secondary | ICD-10-CM | POA: Diagnosis not present

## 2022-07-25 DIAGNOSIS — M545 Low back pain, unspecified: Secondary | ICD-10-CM | POA: Diagnosis not present

## 2022-07-25 DIAGNOSIS — I110 Hypertensive heart disease with heart failure: Secondary | ICD-10-CM | POA: Diagnosis not present

## 2022-07-25 DIAGNOSIS — J9611 Chronic respiratory failure with hypoxia: Secondary | ICD-10-CM | POA: Diagnosis not present

## 2022-07-26 ENCOUNTER — Telehealth: Payer: Self-pay

## 2022-07-26 NOTE — Telephone Encounter (Signed)
Error

## 2022-07-26 NOTE — Telephone Encounter (Signed)
[facility] called to report that patient missed home health visit today due to aid not being available.

## 2022-07-27 ENCOUNTER — Other Ambulatory Visit: Payer: Self-pay | Admitting: Nurse Practitioner

## 2022-07-27 MED ORDER — AMIODARONE HCL 200 MG PO TABS: 200.0000 mg | ORAL_TABLET | Freq: Every day | ORAL | 1 refills | Status: DC

## 2022-07-28 ENCOUNTER — Telehealth: Payer: Self-pay | Admitting: Nurse Practitioner

## 2022-08-01 DIAGNOSIS — I110 Hypertensive heart disease with heart failure: Secondary | ICD-10-CM | POA: Diagnosis not present

## 2022-08-01 DIAGNOSIS — J449 Chronic obstructive pulmonary disease, unspecified: Secondary | ICD-10-CM | POA: Diagnosis not present

## 2022-08-01 DIAGNOSIS — J9611 Chronic respiratory failure with hypoxia: Secondary | ICD-10-CM | POA: Diagnosis not present

## 2022-08-01 DIAGNOSIS — M545 Low back pain, unspecified: Secondary | ICD-10-CM | POA: Diagnosis not present

## 2022-08-01 DIAGNOSIS — I509 Heart failure, unspecified: Secondary | ICD-10-CM | POA: Diagnosis not present

## 2022-08-01 DIAGNOSIS — W19XXXD Unspecified fall, subsequent encounter: Secondary | ICD-10-CM | POA: Diagnosis not present

## 2022-08-02 ENCOUNTER — Telehealth: Payer: Self-pay

## 2022-08-02 NOTE — Telephone Encounter (Signed)
Gave verbal order for nursing to [facility] [phone] for once a week for 9 weeks

## 2022-08-03 ENCOUNTER — Telehealth: Payer: Self-pay

## 2022-08-03 DIAGNOSIS — M545 Low back pain, unspecified: Secondary | ICD-10-CM | POA: Diagnosis not present

## 2022-08-03 DIAGNOSIS — J449 Chronic obstructive pulmonary disease, unspecified: Secondary | ICD-10-CM | POA: Diagnosis not present

## 2022-08-03 DIAGNOSIS — I110 Hypertensive heart disease with heart failure: Secondary | ICD-10-CM | POA: Diagnosis not present

## 2022-08-03 DIAGNOSIS — I509 Heart failure, unspecified: Secondary | ICD-10-CM | POA: Diagnosis not present

## 2022-08-03 DIAGNOSIS — W19XXXD Unspecified fall, subsequent encounter: Secondary | ICD-10-CM | POA: Diagnosis not present

## 2022-08-03 DIAGNOSIS — J9611 Chronic respiratory failure with hypoxia: Secondary | ICD-10-CM | POA: Diagnosis not present

## 2022-08-03 NOTE — Telephone Encounter (Signed)
Gave verbal order to [facility] cindy [phone] for Physical therapy once a week for 9 weeks

## 2022-08-06 DIAGNOSIS — J9611 Chronic respiratory failure with hypoxia: Secondary | ICD-10-CM | POA: Diagnosis not present

## 2022-08-06 DIAGNOSIS — J449 Chronic obstructive pulmonary disease, unspecified: Secondary | ICD-10-CM | POA: Diagnosis not present

## 2022-08-06 DIAGNOSIS — Z853 Personal history of malignant neoplasm of breast: Secondary | ICD-10-CM | POA: Diagnosis not present

## 2022-08-06 DIAGNOSIS — M545 Low back pain, unspecified: Secondary | ICD-10-CM | POA: Diagnosis not present

## 2022-08-06 DIAGNOSIS — W19XXXD Unspecified fall, subsequent encounter: Secondary | ICD-10-CM | POA: Diagnosis not present

## 2022-08-06 DIAGNOSIS — R296 Repeated falls: Secondary | ICD-10-CM | POA: Diagnosis not present

## 2022-08-06 DIAGNOSIS — Z7952 Long term (current) use of systemic steroids: Secondary | ICD-10-CM | POA: Diagnosis not present

## 2022-08-06 DIAGNOSIS — I5033 Acute on chronic diastolic (congestive) heart failure: Secondary | ICD-10-CM | POA: Diagnosis not present

## 2022-08-06 DIAGNOSIS — N1831 Chronic kidney disease, stage 3a: Secondary | ICD-10-CM | POA: Diagnosis not present

## 2022-08-06 DIAGNOSIS — I4891 Unspecified atrial fibrillation: Secondary | ICD-10-CM | POA: Diagnosis not present

## 2022-08-06 DIAGNOSIS — I714 Abdominal aortic aneurysm, without rupture, unspecified: Secondary | ICD-10-CM | POA: Diagnosis not present

## 2022-08-06 DIAGNOSIS — D631 Anemia in chronic kidney disease: Secondary | ICD-10-CM | POA: Diagnosis not present

## 2022-08-06 DIAGNOSIS — M353 Polymyalgia rheumatica: Secondary | ICD-10-CM | POA: Diagnosis not present

## 2022-08-06 DIAGNOSIS — Z9981 Dependence on supplemental oxygen: Secondary | ICD-10-CM | POA: Diagnosis not present

## 2022-08-06 DIAGNOSIS — Z9181 History of falling: Secondary | ICD-10-CM | POA: Diagnosis not present

## 2022-08-06 DIAGNOSIS — Z79891 Long term (current) use of opiate analgesic: Secondary | ICD-10-CM | POA: Diagnosis not present

## 2022-08-06 DIAGNOSIS — I130 Hypertensive heart and chronic kidney disease with heart failure and stage 1 through stage 4 chronic kidney disease, or unspecified chronic kidney disease: Secondary | ICD-10-CM | POA: Diagnosis not present

## 2022-08-06 DIAGNOSIS — E039 Hypothyroidism, unspecified: Secondary | ICD-10-CM | POA: Diagnosis not present

## 2022-08-06 DIAGNOSIS — Z7969 Long term (current) use of other immunomodulators and immunosuppressants: Secondary | ICD-10-CM | POA: Diagnosis not present

## 2022-08-06 DIAGNOSIS — Z7901 Long term (current) use of anticoagulants: Secondary | ICD-10-CM | POA: Diagnosis not present

## 2022-08-06 DIAGNOSIS — M150 Primary generalized (osteo)arthritis: Secondary | ICD-10-CM | POA: Diagnosis not present

## 2022-08-08 DIAGNOSIS — D631 Anemia in chronic kidney disease: Secondary | ICD-10-CM | POA: Diagnosis not present

## 2022-08-08 DIAGNOSIS — I130 Hypertensive heart and chronic kidney disease with heart failure and stage 1 through stage 4 chronic kidney disease, or unspecified chronic kidney disease: Secondary | ICD-10-CM | POA: Diagnosis not present

## 2022-08-08 DIAGNOSIS — J9611 Chronic respiratory failure with hypoxia: Secondary | ICD-10-CM | POA: Diagnosis not present

## 2022-08-08 DIAGNOSIS — I5033 Acute on chronic diastolic (congestive) heart failure: Secondary | ICD-10-CM | POA: Diagnosis not present

## 2022-08-08 DIAGNOSIS — N1831 Chronic kidney disease, stage 3a: Secondary | ICD-10-CM | POA: Diagnosis not present

## 2022-08-08 DIAGNOSIS — J449 Chronic obstructive pulmonary disease, unspecified: Secondary | ICD-10-CM | POA: Diagnosis not present

## 2022-08-09 ENCOUNTER — Encounter: Payer: Self-pay | Admitting: Nurse Practitioner

## 2022-08-11 ENCOUNTER — Other Ambulatory Visit: Payer: Self-pay

## 2022-08-11 ENCOUNTER — Telehealth: Payer: Self-pay

## 2022-08-11 DIAGNOSIS — I5033 Acute on chronic diastolic (congestive) heart failure: Secondary | ICD-10-CM | POA: Diagnosis not present

## 2022-08-11 DIAGNOSIS — J9611 Chronic respiratory failure with hypoxia: Secondary | ICD-10-CM | POA: Diagnosis not present

## 2022-08-11 DIAGNOSIS — J449 Chronic obstructive pulmonary disease, unspecified: Secondary | ICD-10-CM | POA: Diagnosis not present

## 2022-08-11 DIAGNOSIS — I130 Hypertensive heart and chronic kidney disease with heart failure and stage 1 through stage 4 chronic kidney disease, or unspecified chronic kidney disease: Secondary | ICD-10-CM | POA: Diagnosis not present

## 2022-08-11 DIAGNOSIS — D631 Anemia in chronic kidney disease: Secondary | ICD-10-CM | POA: Diagnosis not present

## 2022-08-11 DIAGNOSIS — N1831 Chronic kidney disease, stage 3a: Secondary | ICD-10-CM | POA: Diagnosis not present

## 2022-08-11 MED ORDER — NITROFURANTOIN MONOHYD MACRO 100 MG PO CAPS: ORAL_CAPSULE | ORAL | 0 refills | Status: DC

## 2022-08-11 NOTE — Telephone Encounter (Signed)
Spoke with pt  son he did home test for UTI is positive for leukocytes  and nitrate as per dr Humphrey Rolls send her macrobid for 14 days and then MWF  daily until finished

## 2022-08-15 DIAGNOSIS — I130 Hypertensive heart and chronic kidney disease with heart failure and stage 1 through stage 4 chronic kidney disease, or unspecified chronic kidney disease: Secondary | ICD-10-CM | POA: Diagnosis not present

## 2022-08-15 DIAGNOSIS — N1831 Chronic kidney disease, stage 3a: Secondary | ICD-10-CM | POA: Diagnosis not present

## 2022-08-15 DIAGNOSIS — J9611 Chronic respiratory failure with hypoxia: Secondary | ICD-10-CM | POA: Diagnosis not present

## 2022-08-15 DIAGNOSIS — D631 Anemia in chronic kidney disease: Secondary | ICD-10-CM | POA: Diagnosis not present

## 2022-08-15 DIAGNOSIS — I5033 Acute on chronic diastolic (congestive) heart failure: Secondary | ICD-10-CM | POA: Diagnosis not present

## 2022-08-15 DIAGNOSIS — J449 Chronic obstructive pulmonary disease, unspecified: Secondary | ICD-10-CM | POA: Diagnosis not present

## 2022-08-21 ENCOUNTER — Other Ambulatory Visit: Payer: Self-pay | Admitting: Obstetrics and Gynecology

## 2022-08-22 DIAGNOSIS — I4891 Unspecified atrial fibrillation: Secondary | ICD-10-CM | POA: Diagnosis not present

## 2022-08-22 DIAGNOSIS — J449 Chronic obstructive pulmonary disease, unspecified: Secondary | ICD-10-CM | POA: Diagnosis not present

## 2022-08-22 DIAGNOSIS — I130 Hypertensive heart and chronic kidney disease with heart failure and stage 1 through stage 4 chronic kidney disease, or unspecified chronic kidney disease: Secondary | ICD-10-CM | POA: Diagnosis not present

## 2022-08-22 DIAGNOSIS — I5033 Acute on chronic diastolic (congestive) heart failure: Secondary | ICD-10-CM | POA: Diagnosis not present

## 2022-08-22 DIAGNOSIS — R000 Tachycardia, unspecified: Secondary | ICD-10-CM | POA: Diagnosis not present

## 2022-08-22 DIAGNOSIS — J9611 Chronic respiratory failure with hypoxia: Secondary | ICD-10-CM | POA: Diagnosis not present

## 2022-08-22 DIAGNOSIS — I959 Hypotension, unspecified: Secondary | ICD-10-CM | POA: Diagnosis not present

## 2022-08-22 DIAGNOSIS — N1831 Chronic kidney disease, stage 3a: Secondary | ICD-10-CM | POA: Diagnosis not present

## 2022-08-22 DIAGNOSIS — D631 Anemia in chronic kidney disease: Secondary | ICD-10-CM | POA: Diagnosis not present

## 2022-08-24 ENCOUNTER — Telehealth: Payer: Self-pay | Admitting: Nurse Practitioner

## 2022-08-24 DIAGNOSIS — I130 Hypertensive heart and chronic kidney disease with heart failure and stage 1 through stage 4 chronic kidney disease, or unspecified chronic kidney disease: Secondary | ICD-10-CM | POA: Diagnosis not present

## 2022-08-24 DIAGNOSIS — J449 Chronic obstructive pulmonary disease, unspecified: Secondary | ICD-10-CM | POA: Diagnosis not present

## 2022-08-24 DIAGNOSIS — N1831 Chronic kidney disease, stage 3a: Secondary | ICD-10-CM | POA: Diagnosis not present

## 2022-08-24 DIAGNOSIS — I5033 Acute on chronic diastolic (congestive) heart failure: Secondary | ICD-10-CM | POA: Diagnosis not present

## 2022-08-24 DIAGNOSIS — J9611 Chronic respiratory failure with hypoxia: Secondary | ICD-10-CM | POA: Diagnosis not present

## 2022-08-24 DIAGNOSIS — D631 Anemia in chronic kidney disease: Secondary | ICD-10-CM | POA: Diagnosis not present

## 2022-08-24 NOTE — Telephone Encounter (Signed)
06/13/22 & 07/07/22 Home Health orders signed. Faxed back to [facility]; [phone]

## 2022-08-26 DIAGNOSIS — J449 Chronic obstructive pulmonary disease, unspecified: Secondary | ICD-10-CM | POA: Diagnosis not present

## 2022-08-26 DIAGNOSIS — I130 Hypertensive heart and chronic kidney disease with heart failure and stage 1 through stage 4 chronic kidney disease, or unspecified chronic kidney disease: Secondary | ICD-10-CM | POA: Diagnosis not present

## 2022-08-26 DIAGNOSIS — N1831 Chronic kidney disease, stage 3a: Secondary | ICD-10-CM | POA: Diagnosis not present

## 2022-08-26 DIAGNOSIS — D631 Anemia in chronic kidney disease: Secondary | ICD-10-CM | POA: Diagnosis not present

## 2022-08-26 DIAGNOSIS — J9611 Chronic respiratory failure with hypoxia: Secondary | ICD-10-CM | POA: Diagnosis not present

## 2022-08-26 DIAGNOSIS — I5033 Acute on chronic diastolic (congestive) heart failure: Secondary | ICD-10-CM | POA: Diagnosis not present

## 2022-08-29 DIAGNOSIS — I5033 Acute on chronic diastolic (congestive) heart failure: Secondary | ICD-10-CM | POA: Diagnosis not present

## 2022-08-29 DIAGNOSIS — N1831 Chronic kidney disease, stage 3a: Secondary | ICD-10-CM | POA: Diagnosis not present

## 2022-08-29 DIAGNOSIS — D631 Anemia in chronic kidney disease: Secondary | ICD-10-CM | POA: Diagnosis not present

## 2022-08-29 DIAGNOSIS — I130 Hypertensive heart and chronic kidney disease with heart failure and stage 1 through stage 4 chronic kidney disease, or unspecified chronic kidney disease: Secondary | ICD-10-CM | POA: Diagnosis not present

## 2022-08-29 DIAGNOSIS — J449 Chronic obstructive pulmonary disease, unspecified: Secondary | ICD-10-CM | POA: Diagnosis not present

## 2022-08-29 DIAGNOSIS — J9611 Chronic respiratory failure with hypoxia: Secondary | ICD-10-CM | POA: Diagnosis not present

## 2022-08-30 ENCOUNTER — Telehealth (INDEPENDENT_AMBULATORY_CARE_PROVIDER_SITE_OTHER): Payer: Medicare Other | Admitting: Internal Medicine

## 2022-08-30 ENCOUNTER — Encounter: Payer: Self-pay | Admitting: Internal Medicine

## 2022-08-30 DIAGNOSIS — I4821 Permanent atrial fibrillation: Secondary | ICD-10-CM

## 2022-08-30 DIAGNOSIS — R262 Difficulty in walking, not elsewhere classified: Secondary | ICD-10-CM | POA: Diagnosis not present

## 2022-08-30 DIAGNOSIS — K219 Gastro-esophageal reflux disease without esophagitis: Secondary | ICD-10-CM | POA: Diagnosis not present

## 2022-08-30 DIAGNOSIS — M353 Polymyalgia rheumatica: Secondary | ICD-10-CM

## 2022-08-30 MED ORDER — AMIODARONE HCL 200 MG PO TABS: 200.0000 mg | ORAL_TABLET | Freq: Every day | ORAL | 1 refills | Status: DC

## 2022-08-30 MED ORDER — PREDNISONE 5 MG PO TABS: 5.0000 mg | ORAL_TABLET | Freq: Every day | ORAL | 1 refills | Status: DC

## 2022-08-30 MED ORDER — HYDROCODONE-ACETAMINOPHEN 5-325 MG PO TABS: ORAL_TABLET | ORAL | 0 refills | Status: DC

## 2022-08-30 NOTE — Progress Notes (Signed)
[facility] [place], [place] [address]  Internal MEDICINE  Office Visit Note  Patient Name: Stacy [name]  [id number]  [id number]  Date of Service: 08/30/2022  Chief Complaint  Patient presents with   Telephone Screen    [phone], okay with video call   Telephone Assessment   Hypertension   Medication Management    Wants [place] to manage patient's heart medications until she can find a new cardiologist.    HPI   I connected with the patient at 950 AM  by telephone and verified the patients identity using two identifiers.   I discussed the limitations, risks, security and privacy concerns of performing an evaluation and management service by telephone and the availability of in person appointments. I also discussed with the patient that there may be a patient responsible charge related to the service.  The patient expressed understanding and agrees to proceed. Son is with her  Looks good, able to communicate, needs paper work filled out for medical and social service agency  Has appointment with cardiology Needs refills on pain meds, requires for pain control In her back and hips   Current Medication: Outpatient Encounter Medications as of 08/30/2022  Medication Sig   amiodarone (PACERONE) 200 MG tablet Take 1 tablet (200 mg total) by mouth daily.   apixaban (ELIQUIS) 2.5 MG TABS tablet Take 1 tablet (2.5 mg total) by mouth 2 (two) times daily.   Calcium Carbonate-Vit D-Min (CALCIUM 600+D3 PLUS MINERALS) 600-800 MG-UNIT CHEW Chew 1 tablet by mouth as directed.   gabapentin (NEURONTIN) 100 MG capsule Take 2 tabs 3 x day for neuropathy by mouth (Patient taking differently: Take 100-200 mg by mouth See admin instructions. Take 2 capsules ('200mg'$ ) by mouth every morning, take 1 capsule ('100mg'$ ) by mouth at midday and take 2 capsules ('200mg'$ ) by mouth at bedtime)   HYDROcodone-acetaminophen (NORCO) 5-325 MG tablet Take 0.5-1 tablets by mouth 2 (two) times daily as  needed for moderate pain or severe pain.   hydroxychloroquine (PLAQUENIL) 200 MG tablet Take 1 tablet (200 mg total) by mouth 2 (two) times daily.   lidocaine 4 % Place 1 patch onto the skin daily as needed (pain).   metoprolol tartrate (LOPRESSOR) 25 MG tablet Take 0.5 tablets (12.5 mg total) by mouth 2 (two) times daily.   Multiple Vitamin (MULTIVITAMIN WITH MINERALS) TABS tablet Take 1 tablet by mouth daily.   nitrofurantoin, macrocrystal-monohydrate, (MACROBID) 100 MG capsule Take 1 tab po BID  for 14 day  and then M,W and Friday once a day   pantoprazole (PROTONIX) 40 MG tablet TAKE 1 TABLET TWICE DAILY   predniSONE (DELTASONE) 10 MG tablet Take 1 tablet (10 mg total) by mouth daily with breakfast. (Patient taking differently: Take 5 mg by mouth daily with breakfast.)   No facility-administered encounter medications on file as of 08/30/2022.    Surgical History: Past Surgical History:  Procedure Laterality Date   BREAST BIOPSY Left 2006   +   BREAST SURGERY Left 2006   lumpectomy   [place]   COLON SURGERY  2004   COLONOSCOPY  2014   Dr. [facility]   EYE SURGERY  2002   FOOT SURGERY  1996   HERNIA REPAIR  2013   left breast biopsy   2011    Medical History: Past Medical History:  Diagnosis Date   Arthritis 1940   Asthma    Bowel trouble    Cancer ([place]) 2006   DCIS left  breast   Hypertension 1940   Incisional hernia 2014   Personal history of radiation therapy    Polymyalgia ([place])    Thyroid disorder    Vitiligo 2012    Family History: Family History  Problem Relation Age of Onset   Heart disease Mother    Emphysema Mother    Arthritis Father    Cancer Father    Breast cancer Neg Hx     Social History   Socioeconomic History   Marital status: Widowed    Spouse name: Not on file   Number of children: Not on file   Years of education: Not on file   Highest education level: Not on file  Occupational History   Not on file  Tobacco Use   Smoking  status: Never   Smokeless tobacco: Never  Vaping Use   Vaping Use: Never used  Substance and Sexual Activity   Alcohol use: No   Drug use: No   Sexual activity: Not on file  Other Topics Concern   Not on file  Social History Narrative   Not on file   Social Determinants of Health   Financial Resource Strain: Low Risk  (04/07/2021)   Overall Financial Resource Strain (CARDIA)    Difficulty of Paying Living Expenses: Not very hard  Food Insecurity: Not on file  Transportation Needs: Not on file  Physical Activity: Not on file  Stress: Not on file  Social Connections: Not on file  Intimate Partner Violence: Not on file   Pt is not steady on her feet, risk of fall, uses W/C at home  Denies fever or chills Admits sob    Vital Signs: Resp 16    Physical Exam EXAM ID NOT DONE     Assessment/Plan: 1. Polymyalgia rheumatica (HCC) Refilled all medications - predniSONE (DELTASONE) 5 MG tablet; Take 1 tablet (5 mg total) by mouth daily with breakfast.  Dispense: 90 tablet; Refill: 1 - HYDROcodone-acetaminophen (NORCO) 5-325 MG tablet; Take half tab po bid prn for pain  Dispense: 30 tablet; Refill: 0  2. Permanent atrial fibrillation (HCC) Pt is scheduled to see cardiology  - amiodarone (PACERONE) 200 MG tablet; Take 1 tablet (200 mg total) by mouth daily.  Dispense: 90 tablet; Refill: 1  3. Ambulatory dysfunction Continue PT  4. Gastroesophageal reflux disease without esophagitis    General Counseling: Shantele verbalizes understanding of the findings of todays visit and agrees with plan of treatment. I have discussed any further diagnostic evaluation that may be needed or ordered today. We also reviewed her medications today. she has been encouraged to call the office with any questions or concerns that should arise related to todays visit.    No orders of the defined types were placed in this encounter.   No orders of the defined types were placed in this  encounter.   Total time spent:15 Minutes Time spent includes review of chart, medications, test results, and follow up plan with the patient.   [place] Controlled Substance Database was reviewed by me.   Dr [name] Internal medicine

## 2022-08-31 DIAGNOSIS — J449 Chronic obstructive pulmonary disease, unspecified: Secondary | ICD-10-CM | POA: Diagnosis not present

## 2022-08-31 DIAGNOSIS — D631 Anemia in chronic kidney disease: Secondary | ICD-10-CM | POA: Diagnosis not present

## 2022-08-31 DIAGNOSIS — I5033 Acute on chronic diastolic (congestive) heart failure: Secondary | ICD-10-CM | POA: Diagnosis not present

## 2022-08-31 DIAGNOSIS — N1831 Chronic kidney disease, stage 3a: Secondary | ICD-10-CM | POA: Diagnosis not present

## 2022-08-31 DIAGNOSIS — I130 Hypertensive heart and chronic kidney disease with heart failure and stage 1 through stage 4 chronic kidney disease, or unspecified chronic kidney disease: Secondary | ICD-10-CM | POA: Diagnosis not present

## 2022-08-31 DIAGNOSIS — J9611 Chronic respiratory failure with hypoxia: Secondary | ICD-10-CM | POA: Diagnosis not present

## 2022-09-02 DIAGNOSIS — J449 Chronic obstructive pulmonary disease, unspecified: Secondary | ICD-10-CM | POA: Diagnosis not present

## 2022-09-02 DIAGNOSIS — I5033 Acute on chronic diastolic (congestive) heart failure: Secondary | ICD-10-CM | POA: Diagnosis not present

## 2022-09-02 DIAGNOSIS — N1831 Chronic kidney disease, stage 3a: Secondary | ICD-10-CM | POA: Diagnosis not present

## 2022-09-02 DIAGNOSIS — I130 Hypertensive heart and chronic kidney disease with heart failure and stage 1 through stage 4 chronic kidney disease, or unspecified chronic kidney disease: Secondary | ICD-10-CM | POA: Diagnosis not present

## 2022-09-02 DIAGNOSIS — J9611 Chronic respiratory failure with hypoxia: Secondary | ICD-10-CM | POA: Diagnosis not present

## 2022-09-02 DIAGNOSIS — D631 Anemia in chronic kidney disease: Secondary | ICD-10-CM | POA: Diagnosis not present

## 2022-09-03 ENCOUNTER — Encounter: Payer: Self-pay | Admitting: Internal Medicine

## 2022-09-05 ENCOUNTER — Other Ambulatory Visit: Payer: Self-pay

## 2022-09-05 ENCOUNTER — Emergency Department: Payer: Medicare Other

## 2022-09-05 ENCOUNTER — Telehealth (INDEPENDENT_AMBULATORY_CARE_PROVIDER_SITE_OTHER): Payer: Medicare Other | Admitting: Internal Medicine

## 2022-09-05 VITALS — Resp 16

## 2022-09-05 DIAGNOSIS — R2241 Localized swelling, mass and lump, right lower limb: Secondary | ICD-10-CM | POA: Diagnosis not present

## 2022-09-05 DIAGNOSIS — I714 Abdominal aortic aneurysm, without rupture, unspecified: Secondary | ICD-10-CM | POA: Diagnosis not present

## 2022-09-05 DIAGNOSIS — R609 Edema, unspecified: Secondary | ICD-10-CM | POA: Diagnosis not present

## 2022-09-05 DIAGNOSIS — E039 Hypothyroidism, unspecified: Secondary | ICD-10-CM | POA: Diagnosis not present

## 2022-09-05 DIAGNOSIS — I5033 Acute on chronic diastolic (congestive) heart failure: Secondary | ICD-10-CM | POA: Diagnosis not present

## 2022-09-05 DIAGNOSIS — M150 Primary generalized (osteo)arthritis: Secondary | ICD-10-CM | POA: Diagnosis not present

## 2022-09-05 DIAGNOSIS — Z7901 Long term (current) use of anticoagulants: Secondary | ICD-10-CM | POA: Insufficient documentation

## 2022-09-05 DIAGNOSIS — Z853 Personal history of malignant neoplasm of breast: Secondary | ICD-10-CM | POA: Insufficient documentation

## 2022-09-05 DIAGNOSIS — M545 Low back pain, unspecified: Secondary | ICD-10-CM | POA: Diagnosis not present

## 2022-09-05 DIAGNOSIS — I130 Hypertensive heart and chronic kidney disease with heart failure and stage 1 through stage 4 chronic kidney disease, or unspecified chronic kidney disease: Secondary | ICD-10-CM | POA: Diagnosis not present

## 2022-09-05 DIAGNOSIS — M7989 Other specified soft tissue disorders: Secondary | ICD-10-CM | POA: Diagnosis not present

## 2022-09-05 DIAGNOSIS — R296 Repeated falls: Secondary | ICD-10-CM | POA: Diagnosis not present

## 2022-09-05 DIAGNOSIS — Z79891 Long term (current) use of opiate analgesic: Secondary | ICD-10-CM | POA: Diagnosis not present

## 2022-09-05 DIAGNOSIS — Z9181 History of falling: Secondary | ICD-10-CM | POA: Diagnosis not present

## 2022-09-05 DIAGNOSIS — L03116 Cellulitis of left lower limb: Secondary | ICD-10-CM | POA: Diagnosis not present

## 2022-09-05 DIAGNOSIS — Z9981 Dependence on supplemental oxygen: Secondary | ICD-10-CM | POA: Diagnosis not present

## 2022-09-05 DIAGNOSIS — R600 Localized edema: Secondary | ICD-10-CM | POA: Diagnosis not present

## 2022-09-05 DIAGNOSIS — J9611 Chronic respiratory failure with hypoxia: Secondary | ICD-10-CM | POA: Diagnosis not present

## 2022-09-05 DIAGNOSIS — N1831 Chronic kidney disease, stage 3a: Secondary | ICD-10-CM | POA: Diagnosis not present

## 2022-09-05 DIAGNOSIS — D631 Anemia in chronic kidney disease: Secondary | ICD-10-CM | POA: Diagnosis not present

## 2022-09-05 DIAGNOSIS — Z7952 Long term (current) use of systemic steroids: Secondary | ICD-10-CM | POA: Diagnosis not present

## 2022-09-05 DIAGNOSIS — I10 Essential (primary) hypertension: Secondary | ICD-10-CM | POA: Diagnosis not present

## 2022-09-05 DIAGNOSIS — W19XXXD Unspecified fall, subsequent encounter: Secondary | ICD-10-CM | POA: Diagnosis not present

## 2022-09-05 DIAGNOSIS — I4891 Unspecified atrial fibrillation: Secondary | ICD-10-CM | POA: Diagnosis not present

## 2022-09-05 DIAGNOSIS — Z7969 Long term (current) use of other immunomodulators and immunosuppressants: Secondary | ICD-10-CM | POA: Diagnosis not present

## 2022-09-05 DIAGNOSIS — M79662 Pain in left lower leg: Secondary | ICD-10-CM | POA: Diagnosis not present

## 2022-09-05 DIAGNOSIS — M353 Polymyalgia rheumatica: Secondary | ICD-10-CM | POA: Diagnosis not present

## 2022-09-05 DIAGNOSIS — J449 Chronic obstructive pulmonary disease, unspecified: Secondary | ICD-10-CM | POA: Diagnosis not present

## 2022-09-05 LAB — CBC WITH DIFFERENTIAL/PLATELET
Abs Immature Granulocytes: 0.03 10*3/uL (ref 0.00–0.07)
Basophils Absolute: 0 10*3/uL (ref 0.0–0.1)
Basophils Relative: 1 %
Eosinophils Absolute: 0 10*3/uL (ref 0.0–0.5)
Eosinophils Relative: 0 %
HCT: 28.6 % — ABNORMAL LOW (ref 36.0–46.0)
Hemoglobin: 8.5 g/dL — ABNORMAL LOW (ref 12.0–15.0)
Immature Granulocytes: 1 %
Lymphocytes Relative: 15 %
Lymphs Abs: 1 10*3/uL (ref 0.7–4.0)
MCH: 24.5 pg — ABNORMAL LOW (ref 26.0–34.0)
MCHC: 29.7 g/dL — ABNORMAL LOW (ref 30.0–36.0)
MCV: 82.4 fL (ref 80.0–100.0)
Monocytes Absolute: 0.6 10*3/uL (ref 0.1–1.0)
Monocytes Relative: 10 %
Neutro Abs: 4.9 10*3/uL (ref 1.7–7.7)
Neutrophils Relative %: 73 %
Platelets: 249 10*3/uL (ref 150–400)
RBC: 3.47 MIL/uL — ABNORMAL LOW (ref 3.87–5.11)
RDW: 17.8 % — ABNORMAL HIGH (ref 11.5–15.5)
WBC: 6.6 10*3/uL (ref 4.0–10.5)
nRBC: 0 % (ref 0.0–0.2)

## 2022-09-05 LAB — COMPREHENSIVE METABOLIC PANEL
ALT: 9 U/L (ref 0–44)
AST: 16 U/L (ref 15–41)
Albumin: 3.4 g/dL — ABNORMAL LOW (ref 3.5–5.0)
Alkaline Phosphatase: 59 U/L (ref 38–126)
Anion gap: 7 (ref 5–15)
BUN: 34 mg/dL — ABNORMAL HIGH (ref 8–23)
CO2: 23 mmol/L (ref 22–32)
Calcium: 8.2 mg/dL — ABNORMAL LOW (ref 8.9–10.3)
Chloride: 109 mmol/L (ref 98–111)
Creatinine, Ser: 1.57 mg/dL — ABNORMAL HIGH (ref 0.44–1.00)
GFR, Estimated: 31 mL/min — ABNORMAL LOW (ref >60)
Glucose, Bld: 123 mg/dL — ABNORMAL HIGH (ref 70–99)
Potassium: 4.4 mmol/L (ref 3.5–5.1)
Sodium: 139 mmol/L (ref 135–145)
Total Bilirubin: 0.4 mg/dL (ref 0.3–1.2)
Total Protein: 6.7 g/dL (ref 6.5–8.1)

## 2022-09-05 LAB — BRAIN NATRIURETIC PEPTIDE: B Natriuretic Peptide: 1011.7 pg/mL — ABNORMAL HIGH (ref 0.0–100.0)

## 2022-09-05 LAB — PROTIME-INR
INR: 1.5 — ABNORMAL HIGH (ref 0.8–1.2)
Prothrombin Time: 17.9 seconds — ABNORMAL HIGH (ref 11.4–15.2)

## 2022-09-05 NOTE — Progress Notes (Signed)
[facility] [place], [place] [address]  Internal MEDICINE  Telephone Visit  Patient Name: Stacy [name]  [id number]  [id number]  Date of Service: 09/05/2022  I connected with the patient at 350 pm  by telephone and verified the patients identity using two identifiers.   I discussed the limitations, risks, security and privacy concerns of performing an evaluation and management service by telephone and the availability of in person appointments. I also discussed with the patient that there may be a patient responsible charge related to the service.  The patient expressed understanding and agrees to proceed.    Chief Complaint  Patient presents with   Telephone Screen    [place] with video call: [phone]   Telephone Assessment   Leg Problem    Left shin is swollen all the way to her foot. Son says you can see fluid and a possible sore.  Also spoke with home health nurse, states that patient has not been compliant especially with low sodium diet given or with physical therapy. Home health nurse is no longer sure what to do to help. Patient refuses to physically go to the cardiologists office.    HPI Pt is connected via video for leg swelling in left leg, home health nurse was concerned about swelling, cellulitis. She has difficulty moving around     Current Medication: Outpatient Encounter Medications as of 09/05/2022  Medication Sig   amiodarone (PACERONE) 200 MG tablet Take 1 tablet (200 mg total) by mouth daily.   apixaban (ELIQUIS) 2.5 MG TABS tablet Take 1 tablet (2.5 mg total) by mouth 2 (two) times daily.   Calcium Carbonate-Vit D-Min (CALCIUM 600+D3 PLUS MINERALS) 600-800 MG-UNIT CHEW Chew 1 tablet by mouth as directed.   gabapentin (NEURONTIN) 100 MG capsule Take 2 tabs 3 x day for neuropathy by mouth (Patient taking differently: Take 100-200 mg by mouth See admin instructions. Take 2 capsules ('200mg'$ ) by mouth every morning, take 1 capsule ('100mg'$ ) by  mouth at midday and take 2 capsules ('200mg'$ ) by mouth at bedtime)   HYDROcodone-acetaminophen (NORCO) 5-325 MG tablet Take half tab po bid prn for pain   hydroxychloroquine (PLAQUENIL) 200 MG tablet Take 1 tablet (200 mg total) by mouth 2 (two) times daily.   lidocaine 4 % Place 1 patch onto the skin daily as needed (pain).   metoprolol tartrate (LOPRESSOR) 25 MG tablet Take 0.5 tablets (12.5 mg total) by mouth 2 (two) times daily.   Multiple Vitamin (MULTIVITAMIN WITH MINERALS) TABS tablet Take 1 tablet by mouth daily.   nitrofurantoin, macrocrystal-monohydrate, (MACROBID) 100 MG capsule Take 1 tab po BID  for 14 day  and then M,W and Friday once a day   pantoprazole (PROTONIX) 40 MG tablet TAKE 1 TABLET TWICE DAILY   predniSONE (DELTASONE) 5 MG tablet Take 1 tablet (5 mg total) by mouth daily with breakfast.   No facility-administered encounter medications on file as of 09/05/2022.    Surgical History: Past Surgical History:  Procedure Laterality Date   BREAST BIOPSY Left 2006   +   BREAST SURGERY Left 2006   lumpectomy   [place]   COLON SURGERY  2004   COLONOSCOPY  2014   Dr. [facility]   EYE SURGERY  2002   FOOT SURGERY  1996   HERNIA REPAIR  2013   left breast biopsy   2011    Medical History: Past Medical History:  Diagnosis Date   Arthritis 1940   Asthma  Bowel trouble    Cancer ([place]) 2006   DCIS left breast   Hypertension 1940   Incisional hernia 2014   Personal history of radiation therapy    Polymyalgia ([place])    Thyroid disorder    Vitiligo 2012    Family History: Family History  Problem Relation Age of Onset   Heart disease Mother    Emphysema Mother    Arthritis Father    Cancer Father    Breast cancer Neg Hx     Social History   Socioeconomic History   Marital status: Widowed    Spouse name: Not on file   Number of children: Not on file   Years of education: Not on file   Highest education level: Not on file  Occupational History    Not on file  Tobacco Use   Smoking status: Never   Smokeless tobacco: Never  Vaping Use   Vaping Use: Never used  Substance and Sexual Activity   Alcohol use: No   Drug use: No   Sexual activity: Not on file  Other Topics Concern   Not on file  Social History Narrative   Not on file   Social Determinants of Health   Financial Resource Strain: Low Risk  (04/07/2021)   Overall Financial Resource Strain (CARDIA)    Difficulty of Paying Living Expenses: Not very hard  Food Insecurity: Not on file  Transportation Needs: Not on file  Physical Activity: Not on file  Stress: Not on file  Social Connections: Not on file  Intimate Partner Violence: Not on file      Review of Systems  Constitutional:  Negative for chills and fatigue.  Cardiovascular:  Positive for leg swelling.  Skin:  Positive for color change and rash.    Vital Signs: Resp 16    Observation/Objective: Leg is swollen with erythema present     Assessment/Plan: 1. Localized swelling of right lower leg Cellulitis or DVT, its hard to assess through tele visit, her son is advised to take her to ED for further diagnostics and treatment     General Counseling: Garry verbalizes understanding of the findings of today's phone visit and agrees with plan of treatment. I have discussed any further diagnostic evaluation that may be needed or ordered today. We also reviewed her medications today. she has been encouraged to call the office with any questions or concerns that should arise related to todays visit.    No orders of the defined types were placed in this encounter.   No orders of the defined types were placed in this encounter.   Time spent:15 Minutes    Dr [name] Internal medicine

## 2022-09-05 NOTE — ED Triage Notes (Signed)
COmplains of left leg swelling and redness for "awhile". Patient has wound to great toe.  2+ pitting edema noted.  +doppler pedal pulse on left foot.

## 2022-09-05 NOTE — ED Provider Triage Note (Signed)
Emergency Medicine Provider Triage Evaluation Note  Stacy [name] , a 88 y.o. female  was evaluated in triage.  Pt complains of L leg pain, pedal edema. Symptoms 2-3 days. On eliquis. PCP was concerned for cellulitis vs DVT. No CP, fever, injury to L leg.  Review of Systems  Positive: L leg pain, erythema, edema Negative: CP, shob, fever  Physical Exam  BP 98/73 (BP Location: Right Arm)   Pulse 94   Temp 97.8 F (36.6 C) (Oral)   Resp 15   Ht '5\' 4"'$  (1.626 m)   Wt 72.6 kg   SpO2 100%   BMI 27.46 kg/m  Gen:   Awake, no distress   Resp:  Normal effort  MSK:   Moves extremities without difficulty. Pedal edema, erythema of foot Other:    Medical Decision Making  Medically screening exam initiated at 6:01 PM.  Appropriate orders placed.  Stacy [name] was informed that the remainder of the evaluation will be completed by another provider, this initial triage assessment does not replace that evaluation, and the importance of remaining in the ED until their evaluation is complete.  LLE/L pedal edema, erythema. PCP concerned for cellulitis vs DVT. HX CHF as well. Patient to have labs, imaging at this time   Stacy Moll, PA-C 09/05/22 1801

## 2022-09-06 ENCOUNTER — Emergency Department
Admission: EM | Admit: 2022-09-06 | Discharge: 2022-09-06 | Disposition: A | Discharge status: Home / Self Care | Payer: Medicare Other | Attending: Emergency Medicine | Admitting: Emergency Medicine

## 2022-09-06 DIAGNOSIS — R600 Localized edema: Secondary | ICD-10-CM

## 2022-09-06 DIAGNOSIS — L03116 Cellulitis of left lower limb: Secondary | ICD-10-CM | POA: Diagnosis not present

## 2022-09-06 MED ORDER — SULFAMETHOXAZOLE-TRIMETHOPRIM 800-160 MG PO TABS: 1.0000 | ORAL_TABLET | Freq: Two times a day (BID) | ORAL | 0 refills | Status: AC

## 2022-09-06 MED ORDER — SULFAMETHOXAZOLE-TRIMETHOPRIM 800-160 MG PO TABS
1.0000 | ORAL_TABLET | Freq: Once | ORAL | Status: AC
  Administered 2022-09-06: 1 via ORAL
  Filled 2022-09-06: qty 1

## 2022-09-06 NOTE — ED Provider Notes (Signed)
[facility] Provider Note    Event Date/Time   First MD Initiated Contact with Patient 09/06/22 0530     (approximate)   History   Leg Swelling and Cellulitis   HPI  Stacy [name] is a 88 y.o. female   Past medical history of breast cancer, hypertension, polymyalgia, A-fib on Eliquis per patient report, presents with bilateral lower leg swelling that has been ongoing for months and some redness and pain associated with her left lower extremity, atraumatic.  She denies systemic symptoms like shortness of breath, chest pain, fever or chills.  History was obtained via the patient.  Her daughter is at bedside as an independent historian who corroborates information obtained above.      Physical Exam   Triage Vital Signs: ED Triage Vitals  Enc Vitals Group     BP 09/05/22 1759 98/73     Pulse Rate 09/05/22 1759 94     Resp 09/05/22 1759 15     Temp 09/05/22 1759 97.8 F (36.6 C)     Temp Source 09/05/22 1759 Oral     SpO2 09/05/22 1759 100 %     Weight 09/05/22 1759 160 lb (72.6 kg)     Height 09/05/22 1759 '5\' 4"'$  (1.626 m)     Head Circumference --      Peak Flow --      Pain Score 09/05/22 1806 4     Pain Loc --      Pain Edu? --      Excl. in [place]? --     Most recent vital signs: Vitals:   09/06/22 0430 09/06/22 0609  BP: 116/83 118/72  Pulse: (!) 107 99  Resp: 19 18  Temp: 98.2 F (36.8 C) 98.3 F (36.8 C)  SpO2: 98% 98%    General: Awake, no distress.  CV:  Good peripheral perfusion.  Resp:  Normal effort.  Abd:  No distention.  Other:  Neurovascular intact to the left lower extremity, bilateral lower extremity pitting edema, there is what appears to be a hematoma to the lateral shin on the left side that is tender to palpation, there is no pain out of proportion or crepitus or other signs of a necrotizing fasciitis.  She is able to range at the knee and ankle.  She is sensate.  There is some redness.  No pain on passive  flexion   ED Results / Procedures / Treatments   Labs (all labs ordered are listed, but only abnormal results are displayed) Labs Reviewed  COMPREHENSIVE METABOLIC PANEL - Abnormal; Notable for the following components:      Result Value   Glucose, Bld 123 (*)    BUN 34 (*)    Creatinine, Ser 1.57 (*)    Calcium 8.2 (*)    Albumin 3.4 (*)    GFR, Estimated 31 (*)    All other components within normal limits  CBC WITH DIFFERENTIAL/PLATELET - Abnormal; Notable for the following components:   RBC 3.47 (*)    Hemoglobin 8.5 (*)    HCT 28.6 (*)    MCH 24.5 (*)    MCHC 29.7 (*)    RDW 17.8 (*)    All other components within normal limits  PROTIME-INR - Abnormal; Notable for the following components:   Prothrombin Time 17.9 (*)    INR 1.5 (*)    All other components within normal limits  BRAIN NATRIURETIC PEPTIDE - Abnormal; Notable for the following components:   B  Natriuretic Peptide 1,011.7 (*)    All other components within normal limits     I reviewed labs and they are notable for hemoglobin is 8.5 and at baseline.   RADIOLOGY I independently reviewed and interpreted x-ray of the left foot and see no obvious fractures or dislocations.   PROCEDURES:  Critical Care performed: No  Procedures   MEDICATIONS ORDERED IN ED: Medications  sulfamethoxazole-trimethoprim (BACTRIM DS) 800-160 MG per tablet 1 tablet (1 tablet Oral Given 09/06/22 0607)   IMPRESSION / MDM / ASSESSMENT AND PLAN / ED COURSE  I reviewed the triage vital signs and the nursing notes.                              Differential diagnosis includes, but is not limited to, cellulitis, peripheral edema associated with congestive heart failure or venous insufficiency, DVT, considered but less likely emergent bacterial infections like [place] fasciitis, compartment syndrome    MDM: Patient with DVT scan that is negative for DVTs, x-ray which is negative, he does have bilateral pitting edema from most  likely lymphedema or venous insufficiency versus congestive heart failure though she does not have any shortness of breath and she is not hypoxemic so I will defer diuresis at this time.  I encouraged compression stockings.  She has some cellulitic changes to the left lower extremity so I will start antibiotics.  Not consistent with necrotizing fasciitis or compartment syndrome.  A follow-up visit with cardiologist upcoming.  Patient's presentation is most consistent with acute presentation with potential threat to life or bodily function.       FINAL CLINICAL IMPRESSION(S) / ED DIAGNOSES   Final diagnoses:  Bilateral lower extremity edema  Cellulitis of left lower extremity     Rx / DC Orders   ED Discharge Orders          Ordered    sulfamethoxazole-trimethoprim (BACTRIM DS) 800-160 MG tablet  2 times daily        09/06/22 0602             Note:  This document was prepared using Dragon voice recognition software and may include unintentional dictation errors.    Lucillie Garfinkel, MD 09/06/22 [phone]

## 2022-09-06 NOTE — Discharge Instructions (Signed)
Use compression stockings for your leg swelling.  Take antibiotics.  Follow-up with your cardiologist as scheduled.  Thank you for choosing [place] for your health care today!  Please see your primary doctor this week for a follow up appointment.   Sometimes, in the early stages of certain disease courses it is difficult to detect in the emergency department evaluation -- so, it is important that you continue to monitor your symptoms and call your doctor right away or return to the emergency department if you develop any new or worsening symptoms.  Please go to the following website to schedule new (and existing) patient appointments:   [email or web address]  If you do not have a primary doctor try calling the following clinics to establish care:  If you have insurance:  [facility] [phone] [place] [address]   [facility]  [phone] [place] [place] [address]   If you do not have insurance:  [facility]  [phone] [address], [place] [address]   The following is another list of primary care offices in the area who are accepting new patients at this time.  Please reach out to one of them directly and let them know you would like to schedule an appointment to follow up on an Emergency Department visit, and/or to establish a new primary care provider (PCP).  There are likely other primary care clinics in the are who are accepting new patients, but this is an excellent place to start:  [place] physician: Dr Lavon Paganini [address] [place], [place] [address] [phone]  [facility] Lead Physician: Dr Steele Sizer [address], [place], [place] [address] [phone]  [place] Physician: Dr Park Liter [address] [place], [place] [address] [phone]  [facility] Lead Physician: Dr Dewaine Oats [place], [place], [place] [address] [phone]  [facility] [place] Physician: Dr Halina Maidens [address] [place] Broach [place], [place] [address] [phone]   It was my pleasure to care for you today.   Hoover Brunette Jacelyn Grip, MD

## 2022-09-07 ENCOUNTER — Encounter: Payer: Self-pay | Admitting: Internal Medicine

## 2022-09-07 DIAGNOSIS — J9611 Chronic respiratory failure with hypoxia: Secondary | ICD-10-CM | POA: Diagnosis not present

## 2022-09-07 DIAGNOSIS — N1831 Chronic kidney disease, stage 3a: Secondary | ICD-10-CM | POA: Diagnosis not present

## 2022-09-07 DIAGNOSIS — D631 Anemia in chronic kidney disease: Secondary | ICD-10-CM | POA: Diagnosis not present

## 2022-09-07 DIAGNOSIS — J449 Chronic obstructive pulmonary disease, unspecified: Secondary | ICD-10-CM | POA: Diagnosis not present

## 2022-09-07 DIAGNOSIS — I5033 Acute on chronic diastolic (congestive) heart failure: Secondary | ICD-10-CM | POA: Diagnosis not present

## 2022-09-07 DIAGNOSIS — I130 Hypertensive heart and chronic kidney disease with heart failure and stage 1 through stage 4 chronic kidney disease, or unspecified chronic kidney disease: Secondary | ICD-10-CM | POA: Diagnosis not present

## 2022-09-09 ENCOUNTER — Telehealth: Payer: Self-pay

## 2022-09-09 DIAGNOSIS — L139 Bullous disorder, unspecified: Secondary | ICD-10-CM | POA: Diagnosis not present

## 2022-09-09 DIAGNOSIS — I130 Hypertensive heart and chronic kidney disease with heart failure and stage 1 through stage 4 chronic kidney disease, or unspecified chronic kidney disease: Secondary | ICD-10-CM | POA: Diagnosis present

## 2022-09-09 DIAGNOSIS — S80822A Blister (nonthermal), left lower leg, initial encounter: Secondary | ICD-10-CM | POA: Diagnosis not present

## 2022-09-09 DIAGNOSIS — Z882 Allergy status to sulfonamides status: Secondary | ICD-10-CM | POA: Diagnosis not present

## 2022-09-09 DIAGNOSIS — Z880 Allergy status to penicillin: Secondary | ICD-10-CM | POA: Diagnosis not present

## 2022-09-09 DIAGNOSIS — Z7901 Long term (current) use of anticoagulants: Secondary | ICD-10-CM | POA: Diagnosis not present

## 2022-09-09 DIAGNOSIS — D696 Thrombocytopenia, unspecified: Secondary | ICD-10-CM | POA: Diagnosis not present

## 2022-09-09 DIAGNOSIS — N1831 Chronic kidney disease, stage 3a: Secondary | ICD-10-CM | POA: Diagnosis present

## 2022-09-09 DIAGNOSIS — M1990 Unspecified osteoarthritis, unspecified site: Secondary | ICD-10-CM | POA: Diagnosis not present

## 2022-09-09 DIAGNOSIS — R918 Other nonspecific abnormal finding of lung field: Secondary | ICD-10-CM | POA: Diagnosis not present

## 2022-09-09 DIAGNOSIS — Z7952 Long term (current) use of systemic steroids: Secondary | ICD-10-CM | POA: Diagnosis not present

## 2022-09-09 DIAGNOSIS — A419 Sepsis, unspecified organism: Secondary | ICD-10-CM | POA: Diagnosis present

## 2022-09-09 DIAGNOSIS — R279 Unspecified lack of coordination: Secondary | ICD-10-CM | POA: Diagnosis not present

## 2022-09-09 DIAGNOSIS — R54 Age-related physical debility: Secondary | ICD-10-CM | POA: Diagnosis present

## 2022-09-09 DIAGNOSIS — Z853 Personal history of malignant neoplasm of breast: Secondary | ICD-10-CM | POA: Diagnosis not present

## 2022-09-09 DIAGNOSIS — Z9181 History of falling: Secondary | ICD-10-CM | POA: Diagnosis not present

## 2022-09-09 DIAGNOSIS — I10 Essential (primary) hypertension: Secondary | ICD-10-CM | POA: Insufficient documentation

## 2022-09-09 DIAGNOSIS — I714 Abdominal aortic aneurysm, without rupture, unspecified: Secondary | ICD-10-CM | POA: Diagnosis present

## 2022-09-09 DIAGNOSIS — E161 Other hypoglycemia: Secondary | ICD-10-CM | POA: Diagnosis present

## 2022-09-09 DIAGNOSIS — I4891 Unspecified atrial fibrillation: Secondary | ICD-10-CM | POA: Diagnosis not present

## 2022-09-09 DIAGNOSIS — I5022 Chronic systolic (congestive) heart failure: Secondary | ICD-10-CM | POA: Diagnosis present

## 2022-09-09 DIAGNOSIS — I5020 Unspecified systolic (congestive) heart failure: Secondary | ICD-10-CM | POA: Diagnosis not present

## 2022-09-09 DIAGNOSIS — I480 Paroxysmal atrial fibrillation: Secondary | ICD-10-CM | POA: Diagnosis present

## 2022-09-09 DIAGNOSIS — K219 Gastro-esophageal reflux disease without esophagitis: Secondary | ICD-10-CM | POA: Diagnosis not present

## 2022-09-09 DIAGNOSIS — D509 Iron deficiency anemia, unspecified: Secondary | ICD-10-CM | POA: Diagnosis not present

## 2022-09-09 DIAGNOSIS — L03116 Cellulitis of left lower limb: Secondary | ICD-10-CM | POA: Diagnosis present

## 2022-09-09 DIAGNOSIS — E785 Hyperlipidemia, unspecified: Secondary | ICD-10-CM | POA: Diagnosis present

## 2022-09-09 DIAGNOSIS — M353 Polymyalgia rheumatica: Secondary | ICD-10-CM | POA: Diagnosis not present

## 2022-09-09 DIAGNOSIS — M6281 Muscle weakness (generalized): Secondary | ICD-10-CM | POA: Diagnosis not present

## 2022-09-09 DIAGNOSIS — M7981 Nontraumatic hematoma of soft tissue: Secondary | ICD-10-CM | POA: Diagnosis present

## 2022-09-09 DIAGNOSIS — Z7401 Bed confinement status: Secondary | ICD-10-CM | POA: Diagnosis not present

## 2022-09-09 DIAGNOSIS — J449 Chronic obstructive pulmonary disease, unspecified: Secondary | ICD-10-CM | POA: Diagnosis present

## 2022-09-09 DIAGNOSIS — R4189 Other symptoms and signs involving cognitive functions and awareness: Secondary | ICD-10-CM | POA: Diagnosis not present

## 2022-09-09 DIAGNOSIS — Z741 Need for assistance with personal care: Secondary | ICD-10-CM | POA: Diagnosis not present

## 2022-09-09 DIAGNOSIS — R5381 Other malaise: Secondary | ICD-10-CM | POA: Diagnosis not present

## 2022-09-09 DIAGNOSIS — D6959 Other secondary thrombocytopenia: Secondary | ICD-10-CM | POA: Diagnosis present

## 2022-09-09 DIAGNOSIS — E039 Hypothyroidism, unspecified: Secondary | ICD-10-CM | POA: Insufficient documentation

## 2022-09-09 DIAGNOSIS — R278 Other lack of coordination: Secondary | ICD-10-CM | POA: Diagnosis not present

## 2022-09-09 NOTE — Telephone Encounter (Signed)
Left a message for patient's son after he hung up the phone on [facility] advising him to take his mother to the ED. Told patient to call back if he has additional questions.

## 2022-09-09 NOTE — Telephone Encounter (Signed)
Pt son called that she went to Ed and she had cellulitis  and its getting worse advised him that pt need go to ED  or need appt pt son hung up phone and alyssa also pt need go to ED

## 2022-09-10 DIAGNOSIS — I4891 Unspecified atrial fibrillation: Secondary | ICD-10-CM | POA: Diagnosis not present

## 2022-09-10 DIAGNOSIS — M353 Polymyalgia rheumatica: Secondary | ICD-10-CM | POA: Diagnosis not present

## 2022-09-10 DIAGNOSIS — L03116 Cellulitis of left lower limb: Secondary | ICD-10-CM | POA: Insufficient documentation

## 2022-09-10 DIAGNOSIS — L139 Bullous disorder, unspecified: Secondary | ICD-10-CM | POA: Diagnosis not present

## 2022-09-11 DIAGNOSIS — I4891 Unspecified atrial fibrillation: Secondary | ICD-10-CM | POA: Diagnosis not present

## 2022-09-11 DIAGNOSIS — L139 Bullous disorder, unspecified: Secondary | ICD-10-CM | POA: Diagnosis not present

## 2022-09-11 DIAGNOSIS — L03116 Cellulitis of left lower limb: Secondary | ICD-10-CM | POA: Diagnosis not present

## 2022-09-11 DIAGNOSIS — M353 Polymyalgia rheumatica: Secondary | ICD-10-CM | POA: Diagnosis not present

## 2022-09-12 DIAGNOSIS — L03116 Cellulitis of left lower limb: Secondary | ICD-10-CM | POA: Diagnosis present

## 2022-09-12 DIAGNOSIS — D696 Thrombocytopenia, unspecified: Secondary | ICD-10-CM | POA: Diagnosis not present

## 2022-09-12 DIAGNOSIS — Z880 Allergy status to penicillin: Secondary | ICD-10-CM | POA: Diagnosis not present

## 2022-09-12 DIAGNOSIS — M1990 Unspecified osteoarthritis, unspecified site: Secondary | ICD-10-CM | POA: Diagnosis not present

## 2022-09-12 DIAGNOSIS — Z853 Personal history of malignant neoplasm of breast: Secondary | ICD-10-CM | POA: Diagnosis not present

## 2022-09-12 DIAGNOSIS — R5381 Other malaise: Secondary | ICD-10-CM | POA: Diagnosis not present

## 2022-09-12 DIAGNOSIS — Z7401 Bed confinement status: Secondary | ICD-10-CM | POA: Diagnosis not present

## 2022-09-12 DIAGNOSIS — L139 Bullous disorder, unspecified: Secondary | ICD-10-CM | POA: Diagnosis not present

## 2022-09-12 DIAGNOSIS — I4891 Unspecified atrial fibrillation: Secondary | ICD-10-CM | POA: Diagnosis not present

## 2022-09-12 DIAGNOSIS — I5022 Chronic systolic (congestive) heart failure: Secondary | ICD-10-CM | POA: Diagnosis present

## 2022-09-12 DIAGNOSIS — D509 Iron deficiency anemia, unspecified: Secondary | ICD-10-CM | POA: Diagnosis present

## 2022-09-12 DIAGNOSIS — E161 Other hypoglycemia: Secondary | ICD-10-CM | POA: Diagnosis present

## 2022-09-12 DIAGNOSIS — N1831 Chronic kidney disease, stage 3a: Secondary | ICD-10-CM | POA: Diagnosis present

## 2022-09-12 DIAGNOSIS — Z882 Allergy status to sulfonamides status: Secondary | ICD-10-CM | POA: Diagnosis not present

## 2022-09-12 DIAGNOSIS — K219 Gastro-esophageal reflux disease without esophagitis: Secondary | ICD-10-CM | POA: Diagnosis present

## 2022-09-12 DIAGNOSIS — M6281 Muscle weakness (generalized): Secondary | ICD-10-CM | POA: Diagnosis not present

## 2022-09-12 DIAGNOSIS — Z7901 Long term (current) use of anticoagulants: Secondary | ICD-10-CM | POA: Diagnosis not present

## 2022-09-12 DIAGNOSIS — M7981 Nontraumatic hematoma of soft tissue: Secondary | ICD-10-CM | POA: Diagnosis present

## 2022-09-12 DIAGNOSIS — R278 Other lack of coordination: Secondary | ICD-10-CM | POA: Diagnosis not present

## 2022-09-12 DIAGNOSIS — I714 Abdominal aortic aneurysm, without rupture, unspecified: Secondary | ICD-10-CM | POA: Diagnosis present

## 2022-09-12 DIAGNOSIS — R4189 Other symptoms and signs involving cognitive functions and awareness: Secondary | ICD-10-CM | POA: Diagnosis not present

## 2022-09-12 DIAGNOSIS — E785 Hyperlipidemia, unspecified: Secondary | ICD-10-CM | POA: Diagnosis present

## 2022-09-12 DIAGNOSIS — I5020 Unspecified systolic (congestive) heart failure: Secondary | ICD-10-CM | POA: Diagnosis not present

## 2022-09-12 DIAGNOSIS — D6959 Other secondary thrombocytopenia: Secondary | ICD-10-CM | POA: Diagnosis present

## 2022-09-12 DIAGNOSIS — R279 Unspecified lack of coordination: Secondary | ICD-10-CM | POA: Diagnosis not present

## 2022-09-12 DIAGNOSIS — Z9181 History of falling: Secondary | ICD-10-CM | POA: Diagnosis not present

## 2022-09-12 DIAGNOSIS — M353 Polymyalgia rheumatica: Secondary | ICD-10-CM | POA: Diagnosis present

## 2022-09-12 DIAGNOSIS — J449 Chronic obstructive pulmonary disease, unspecified: Secondary | ICD-10-CM | POA: Diagnosis present

## 2022-09-12 DIAGNOSIS — R54 Age-related physical debility: Secondary | ICD-10-CM | POA: Diagnosis present

## 2022-09-12 DIAGNOSIS — Z741 Need for assistance with personal care: Secondary | ICD-10-CM | POA: Diagnosis not present

## 2022-09-12 DIAGNOSIS — A419 Sepsis, unspecified organism: Secondary | ICD-10-CM | POA: Diagnosis present

## 2022-09-12 DIAGNOSIS — I130 Hypertensive heart and chronic kidney disease with heart failure and stage 1 through stage 4 chronic kidney disease, or unspecified chronic kidney disease: Secondary | ICD-10-CM | POA: Diagnosis present

## 2022-09-12 DIAGNOSIS — I480 Paroxysmal atrial fibrillation: Secondary | ICD-10-CM | POA: Diagnosis present

## 2022-09-12 DIAGNOSIS — Z7952 Long term (current) use of systemic steroids: Secondary | ICD-10-CM | POA: Diagnosis not present

## 2022-09-13 DIAGNOSIS — L97428 Non-pressure chronic ulcer of left heel and midfoot with other specified severity: Secondary | ICD-10-CM | POA: Diagnosis not present

## 2022-09-13 DIAGNOSIS — I7142 Juxtarenal abdominal aortic aneurysm, without rupture: Secondary | ICD-10-CM | POA: Diagnosis not present

## 2022-09-13 DIAGNOSIS — S41109A Unspecified open wound of unspecified upper arm, initial encounter: Secondary | ICD-10-CM | POA: Diagnosis not present

## 2022-09-13 DIAGNOSIS — E118 Type 2 diabetes mellitus with unspecified complications: Secondary | ICD-10-CM | POA: Diagnosis not present

## 2022-09-13 DIAGNOSIS — I082 Rheumatic disorders of both aortic and tricuspid valves: Secondary | ICD-10-CM | POA: Diagnosis present

## 2022-09-13 DIAGNOSIS — L97821 Non-pressure chronic ulcer of other part of left lower leg limited to breakdown of skin: Secondary | ICD-10-CM | POA: Diagnosis not present

## 2022-09-13 DIAGNOSIS — R197 Diarrhea, unspecified: Secondary | ICD-10-CM | POA: Diagnosis not present

## 2022-09-13 DIAGNOSIS — B351 Tinea unguium: Secondary | ICD-10-CM | POA: Diagnosis not present

## 2022-09-13 DIAGNOSIS — L97429 Non-pressure chronic ulcer of left heel and midfoot with unspecified severity: Secondary | ICD-10-CM | POA: Diagnosis present

## 2022-09-13 DIAGNOSIS — E440 Moderate protein-calorie malnutrition: Secondary | ICD-10-CM | POA: Diagnosis not present

## 2022-09-13 DIAGNOSIS — D649 Anemia, unspecified: Secondary | ICD-10-CM | POA: Diagnosis not present

## 2022-09-13 DIAGNOSIS — T148XXA Other injury of unspecified body region, initial encounter: Secondary | ICD-10-CM | POA: Diagnosis not present

## 2022-09-13 DIAGNOSIS — D696 Thrombocytopenia, unspecified: Secondary | ICD-10-CM | POA: Diagnosis not present

## 2022-09-13 DIAGNOSIS — I517 Cardiomegaly: Secondary | ICD-10-CM | POA: Diagnosis not present

## 2022-09-13 DIAGNOSIS — E039 Hypothyroidism, unspecified: Secondary | ICD-10-CM | POA: Diagnosis present

## 2022-09-13 DIAGNOSIS — R0902 Hypoxemia: Secondary | ICD-10-CM | POA: Diagnosis not present

## 2022-09-13 DIAGNOSIS — I959 Hypotension, unspecified: Secondary | ICD-10-CM | POA: Diagnosis present

## 2022-09-13 DIAGNOSIS — M797 Fibromyalgia: Secondary | ICD-10-CM | POA: Diagnosis present

## 2022-09-13 DIAGNOSIS — Z853 Personal history of malignant neoplasm of breast: Secondary | ICD-10-CM | POA: Diagnosis not present

## 2022-09-13 DIAGNOSIS — R54 Age-related physical debility: Secondary | ICD-10-CM | POA: Diagnosis present

## 2022-09-13 DIAGNOSIS — I70245 Atherosclerosis of native arteries of left leg with ulceration of other part of foot: Secondary | ICD-10-CM | POA: Diagnosis not present

## 2022-09-13 DIAGNOSIS — M064 Inflammatory polyarthropathy: Secondary | ICD-10-CM | POA: Diagnosis present

## 2022-09-13 DIAGNOSIS — I739 Peripheral vascular disease, unspecified: Secondary | ICD-10-CM | POA: Diagnosis not present

## 2022-09-13 DIAGNOSIS — M150 Primary generalized (osteo)arthritis: Secondary | ICD-10-CM | POA: Diagnosis not present

## 2022-09-13 DIAGNOSIS — J9611 Chronic respiratory failure with hypoxia: Secondary | ICD-10-CM | POA: Diagnosis not present

## 2022-09-13 DIAGNOSIS — E876 Hypokalemia: Secondary | ICD-10-CM | POA: Diagnosis present

## 2022-09-13 DIAGNOSIS — Z515 Encounter for palliative care: Secondary | ICD-10-CM | POA: Diagnosis not present

## 2022-09-13 DIAGNOSIS — R052 Subacute cough: Secondary | ICD-10-CM | POA: Diagnosis not present

## 2022-09-13 DIAGNOSIS — M87052 Idiopathic aseptic necrosis of left femur: Secondary | ICD-10-CM | POA: Diagnosis not present

## 2022-09-13 DIAGNOSIS — Z741 Need for assistance with personal care: Secondary | ICD-10-CM | POA: Diagnosis not present

## 2022-09-13 DIAGNOSIS — I4819 Other persistent atrial fibrillation: Secondary | ICD-10-CM | POA: Diagnosis not present

## 2022-09-13 DIAGNOSIS — I714 Abdominal aortic aneurysm, without rupture, unspecified: Secondary | ICD-10-CM | POA: Diagnosis present

## 2022-09-13 DIAGNOSIS — I499 Cardiac arrhythmia, unspecified: Secondary | ICD-10-CM | POA: Diagnosis not present

## 2022-09-13 DIAGNOSIS — E871 Hypo-osmolality and hyponatremia: Secondary | ICD-10-CM | POA: Diagnosis present

## 2022-09-13 DIAGNOSIS — K219 Gastro-esophageal reflux disease without esophagitis: Secondary | ICD-10-CM | POA: Diagnosis not present

## 2022-09-13 DIAGNOSIS — R7881 Bacteremia: Secondary | ICD-10-CM | POA: Diagnosis present

## 2022-09-13 DIAGNOSIS — L089 Local infection of the skin and subcutaneous tissue, unspecified: Secondary | ICD-10-CM | POA: Diagnosis not present

## 2022-09-13 DIAGNOSIS — I5042 Chronic combined systolic (congestive) and diastolic (congestive) heart failure: Secondary | ICD-10-CM | POA: Diagnosis present

## 2022-09-13 DIAGNOSIS — R110 Nausea: Secondary | ICD-10-CM | POA: Diagnosis not present

## 2022-09-13 DIAGNOSIS — I4891 Unspecified atrial fibrillation: Secondary | ICD-10-CM | POA: Diagnosis not present

## 2022-09-13 DIAGNOSIS — L03116 Cellulitis of left lower limb: Secondary | ICD-10-CM | POA: Diagnosis present

## 2022-09-13 DIAGNOSIS — D509 Iron deficiency anemia, unspecified: Secondary | ICD-10-CM | POA: Diagnosis not present

## 2022-09-13 DIAGNOSIS — I509 Heart failure, unspecified: Secondary | ICD-10-CM | POA: Diagnosis not present

## 2022-09-13 DIAGNOSIS — R278 Other lack of coordination: Secondary | ICD-10-CM | POA: Diagnosis not present

## 2022-09-13 DIAGNOSIS — I130 Hypertensive heart and chronic kidney disease with heart failure and stage 1 through stage 4 chronic kidney disease, or unspecified chronic kidney disease: Secondary | ICD-10-CM | POA: Diagnosis not present

## 2022-09-13 DIAGNOSIS — S91332A Puncture wound without foreign body, left foot, initial encounter: Secondary | ICD-10-CM | POA: Diagnosis not present

## 2022-09-13 DIAGNOSIS — I480 Paroxysmal atrial fibrillation: Secondary | ICD-10-CM | POA: Diagnosis present

## 2022-09-13 DIAGNOSIS — J410 Simple chronic bronchitis: Secondary | ICD-10-CM | POA: Diagnosis not present

## 2022-09-13 DIAGNOSIS — L97522 Non-pressure chronic ulcer of other part of left foot with fat layer exposed: Secondary | ICD-10-CM | POA: Diagnosis present

## 2022-09-13 DIAGNOSIS — S91302A Unspecified open wound, left foot, initial encounter: Secondary | ICD-10-CM | POA: Diagnosis not present

## 2022-09-13 DIAGNOSIS — L97519 Non-pressure chronic ulcer of other part of right foot with unspecified severity: Secondary | ICD-10-CM | POA: Diagnosis not present

## 2022-09-13 DIAGNOSIS — Z9181 History of falling: Secondary | ICD-10-CM | POA: Diagnosis not present

## 2022-09-13 DIAGNOSIS — I7091 Generalized atherosclerosis: Secondary | ICD-10-CM | POA: Diagnosis not present

## 2022-09-13 DIAGNOSIS — R0602 Shortness of breath: Secondary | ICD-10-CM | POA: Diagnosis not present

## 2022-09-13 DIAGNOSIS — M1990 Unspecified osteoarthritis, unspecified site: Secondary | ICD-10-CM | POA: Diagnosis not present

## 2022-09-13 DIAGNOSIS — L97528 Non-pressure chronic ulcer of other part of left foot with other specified severity: Secondary | ICD-10-CM | POA: Diagnosis present

## 2022-09-13 DIAGNOSIS — I5022 Chronic systolic (congestive) heart failure: Secondary | ICD-10-CM | POA: Diagnosis not present

## 2022-09-13 DIAGNOSIS — S81809A Unspecified open wound, unspecified lower leg, initial encounter: Secondary | ICD-10-CM | POA: Diagnosis not present

## 2022-09-13 DIAGNOSIS — E162 Hypoglycemia, unspecified: Secondary | ICD-10-CM | POA: Diagnosis present

## 2022-09-13 DIAGNOSIS — B955 Unspecified streptococcus as the cause of diseases classified elsewhere: Secondary | ICD-10-CM | POA: Diagnosis not present

## 2022-09-13 DIAGNOSIS — H9193 Unspecified hearing loss, bilateral: Secondary | ICD-10-CM | POA: Diagnosis not present

## 2022-09-13 DIAGNOSIS — R0789 Other chest pain: Secondary | ICD-10-CM | POA: Diagnosis not present

## 2022-09-13 DIAGNOSIS — I10 Essential (primary) hypertension: Secondary | ICD-10-CM | POA: Diagnosis not present

## 2022-09-13 DIAGNOSIS — I5020 Unspecified systolic (congestive) heart failure: Secondary | ICD-10-CM | POA: Diagnosis not present

## 2022-09-13 DIAGNOSIS — J449 Chronic obstructive pulmonary disease, unspecified: Secondary | ICD-10-CM | POA: Diagnosis not present

## 2022-09-13 DIAGNOSIS — M353 Polymyalgia rheumatica: Secondary | ICD-10-CM | POA: Diagnosis not present

## 2022-09-13 DIAGNOSIS — R279 Unspecified lack of coordination: Secondary | ICD-10-CM | POA: Diagnosis not present

## 2022-09-13 DIAGNOSIS — I5023 Acute on chronic systolic (congestive) heart failure: Secondary | ICD-10-CM | POA: Diagnosis not present

## 2022-09-13 DIAGNOSIS — I350 Nonrheumatic aortic (valve) stenosis: Secondary | ICD-10-CM | POA: Diagnosis not present

## 2022-09-13 DIAGNOSIS — B950 Streptococcus, group A, as the cause of diseases classified elsewhere: Secondary | ICD-10-CM | POA: Diagnosis present

## 2022-09-13 DIAGNOSIS — J90 Pleural effusion, not elsewhere classified: Secondary | ICD-10-CM | POA: Diagnosis not present

## 2022-09-13 DIAGNOSIS — N1831 Chronic kidney disease, stage 3a: Secondary | ICD-10-CM | POA: Diagnosis present

## 2022-09-13 DIAGNOSIS — R001 Bradycardia, unspecified: Secondary | ICD-10-CM | POA: Diagnosis not present

## 2022-09-13 DIAGNOSIS — L139 Bullous disorder, unspecified: Secondary | ICD-10-CM | POA: Diagnosis not present

## 2022-09-13 DIAGNOSIS — R059 Cough, unspecified: Secondary | ICD-10-CM | POA: Diagnosis not present

## 2022-09-13 DIAGNOSIS — H1033 Unspecified acute conjunctivitis, bilateral: Secondary | ICD-10-CM | POA: Diagnosis not present

## 2022-09-13 DIAGNOSIS — M6281 Muscle weakness (generalized): Secondary | ICD-10-CM | POA: Diagnosis not present

## 2022-09-13 DIAGNOSIS — D638 Anemia in other chronic diseases classified elsewhere: Secondary | ICD-10-CM | POA: Diagnosis present

## 2022-09-13 DIAGNOSIS — M7989 Other specified soft tissue disorders: Secondary | ICD-10-CM | POA: Diagnosis not present

## 2022-09-13 DIAGNOSIS — L97529 Non-pressure chronic ulcer of other part of left foot with unspecified severity: Secondary | ICD-10-CM | POA: Diagnosis not present

## 2022-09-13 DIAGNOSIS — I70201 Unspecified atherosclerosis of native arteries of extremities, right leg: Secondary | ICD-10-CM | POA: Diagnosis not present

## 2022-09-13 DIAGNOSIS — A491 Streptococcal infection, unspecified site: Secondary | ICD-10-CM | POA: Diagnosis not present

## 2022-09-13 DIAGNOSIS — R5381 Other malaise: Secondary | ICD-10-CM | POA: Diagnosis not present

## 2022-09-13 DIAGNOSIS — R4189 Other symptoms and signs involving cognitive functions and awareness: Secondary | ICD-10-CM | POA: Diagnosis not present

## 2022-09-13 DIAGNOSIS — I83028 Varicose veins of left lower extremity with ulcer other part of lower leg: Secondary | ICD-10-CM | POA: Diagnosis not present

## 2022-09-13 DIAGNOSIS — Z66 Do not resuscitate: Secondary | ICD-10-CM | POA: Diagnosis present

## 2022-09-14 ENCOUNTER — Non-Acute Institutional Stay (SKILLED_NURSING_FACILITY): Payer: Medicare Other | Admitting: Student

## 2022-09-14 ENCOUNTER — Encounter: Payer: Self-pay | Admitting: Student

## 2022-09-14 DIAGNOSIS — J410 Simple chronic bronchitis: Secondary | ICD-10-CM | POA: Diagnosis not present

## 2022-09-14 DIAGNOSIS — L03116 Cellulitis of left lower limb: Secondary | ICD-10-CM

## 2022-09-14 DIAGNOSIS — I517 Cardiomegaly: Secondary | ICD-10-CM

## 2022-09-14 DIAGNOSIS — I4891 Unspecified atrial fibrillation: Secondary | ICD-10-CM | POA: Diagnosis not present

## 2022-09-14 DIAGNOSIS — I509 Heart failure, unspecified: Secondary | ICD-10-CM | POA: Diagnosis not present

## 2022-09-14 DIAGNOSIS — H9193 Unspecified hearing loss, bilateral: Secondary | ICD-10-CM | POA: Diagnosis not present

## 2022-09-14 DIAGNOSIS — J9611 Chronic respiratory failure with hypoxia: Secondary | ICD-10-CM

## 2022-09-14 DIAGNOSIS — N1831 Chronic kidney disease, stage 3a: Secondary | ICD-10-CM

## 2022-09-14 DIAGNOSIS — I7142 Juxtarenal abdominal aortic aneurysm, without rupture: Secondary | ICD-10-CM

## 2022-09-14 DIAGNOSIS — R413 Other amnesia: Secondary | ICD-10-CM

## 2022-09-14 DIAGNOSIS — I5023 Acute on chronic systolic (congestive) heart failure: Secondary | ICD-10-CM

## 2022-09-14 DIAGNOSIS — J449 Chronic obstructive pulmonary disease, unspecified: Secondary | ICD-10-CM | POA: Diagnosis not present

## 2022-09-14 DIAGNOSIS — I10 Essential (primary) hypertension: Secondary | ICD-10-CM

## 2022-09-14 DIAGNOSIS — E039 Hypothyroidism, unspecified: Secondary | ICD-10-CM

## 2022-09-14 DIAGNOSIS — Z741 Need for assistance with personal care: Secondary | ICD-10-CM

## 2022-09-14 DIAGNOSIS — N3946 Mixed incontinence: Secondary | ICD-10-CM

## 2022-09-14 DIAGNOSIS — D62 Acute posthemorrhagic anemia: Secondary | ICD-10-CM

## 2022-09-14 DIAGNOSIS — M353 Polymyalgia rheumatica: Secondary | ICD-10-CM

## 2022-09-14 NOTE — Progress Notes (Signed)
Provider:   Location:  [facility] Room Number: 102-A Place of Service:  SNF ([phone])  PCP: [name], MD Patient Care Team: [name], MD as PCP - General (Internal Medicine) Christene Lye, MD as Consulting Physician (General Surgery) Alena Bills, [facility] as Pharmacist (Pharmacist)  Extended Emergency Contact Information Primary Emergency Contact: Byrd,[name] Address: [address]          [place], [place] [address] [place] Phone: [phone] Mobile Phone: [phone] Relation: Son Secondary Emergency Contact: [name] Address: [address]          [place], [place] [address] [place] Mobile Phone: [phone] Relation: Daughter  Code Status: DNAR  Goals of Care: Advanced Directive information    09/14/2022    5:02 PM  Advanced Directives  Does Patient Have a Medical Advance Directive? Yes  Type of Paramedic of [place];Out of facility DNR (pink MOST or yellow form)  Does patient want to make changes to medical advance directive? No - Patient declined  Copy of [place] in Chart? Yes - validated most recent copy scanned in chart (See row information)    Chief Complaint  Patient presents with   Hospitalization Follow-up    Follow-up from recent hospital stay. Vitals and medications are a reflection of [facility] system, [facility]     HPI: Patient is a 88 y.o. female seen today for admission to [facility]  She went to [facility] on 09/06/2022  She was [facility] to [facility] on 12/15 and she had a blister on the leg. Her feet were swelling and they are still swollen today.   She lives on [address]. Her son lives with her. He does everything like cooking, brings her coffee. If she is able she goes to the kitchen. He does laundry. They manage finances together - he does the bills for her. She doesn't drive since about a year ago. Her son can't drive  either because ehe has sores on his feet. She uses a wheelchair at home. She cannot walk  and stopped about a year. She has had some falls this year-- two fans connected to one cord and she hit into the wall. Her son manages the medications for her - 3x per day.   She has pain on the left side. She has had pain there for some time.   Oriented to self. Says it's the month of christmas, December 1921. Tuesday or Wednesday.   Advance Care Planning  Spoke with patient and children (HCPOA) regarding patient's code status. Patient endorses, "I think that would be more harm than good." Children agree with this stance. Patient also says if she gets ill, she would prefer to receive treatment in the facility rather than to go back and forth to the hospital. Will continue goals of care conversations as indicated.    Update CODE STATUS to DNAR.       Past Medical History:  Diagnosis Date   Arthritis 1940   Asthma    Bowel trouble    Cancer ([place]) 2006   DCIS left breast   Hypertension 1940   Incisional hernia 2014   Personal history of radiation therapy    Polymyalgia ([place])    Thyroid disorder    Vitiligo 2012   Past Surgical History:  Procedure Laterality Date   BREAST BIOPSY Left 2006   +   BREAST SURGERY Left 2006  lumpectomy   [facility]   COLON SURGERY  2004   COLONOSCOPY  2014   Dr. Candace Cruise   EYE SURGERY  2002   FOOT SURGERY  1996   HERNIA REPAIR  2013   left breast biopsy   2011    reports that she has never smoked. She has never used smokeless tobacco. She reports that she does not drink alcohol and does not use drugs. Social History   Socioeconomic History   Marital status: Widowed    Spouse name: Not on file   Number of children: Not on file   Years of education: Not on file   Highest education level: Not on file  Occupational History   Not on file  Tobacco Use   Smoking status: Never   Smokeless tobacco: Never  Vaping Use   Vaping Use: Never used   Substance and Sexual Activity   Alcohol use: No   Drug use: No   Sexual activity: Not on file  Other Topics Concern   Not on file  Social History Narrative   Not on file   Social Determinants of Health   Financial Resource Strain: Low Risk  (04/07/2021)   Overall Financial Resource Strain (CARDIA)    Difficulty of Paying Living Expenses: Not very hard  Food Insecurity: Not on file  Transportation Needs: Not on file  Physical Activity: Not on file  Stress: Not on file  Social Connections: Not on file  Intimate Partner Violence: Not on file    Functional Status Survey:    Family History  Problem Relation Age of Onset   Heart disease Mother    Emphysema Mother    Arthritis Father    Cancer Father    Breast cancer Neg Hx     Health Maintenance  Topic Date Due   DTaP/Tdap/Td (1 - Tdap) Never done   Zoster Vaccines- Shingrix (1 of 2) Never done   Pneumonia Vaccine 93+ Years old (1 - PCV) Never done   Medicare Annual Wellness (AWV)  07/06/2019   FOOT EXAM  05/10/2022   OPHTHALMOLOGY EXAM  05/10/2022   COVID-19 Vaccine (4 - 2023-24 season) 05/27/2022   HEMOGLOBIN A1C  01/02/2023   INFLUENZA VACCINE  Completed   DEXA SCAN  Completed   HPV VACCINES  Aged Out    Allergies  Allergen Reactions   Aspirin Itching and Other (See Comments)    Dizzy/passing out   Augmentin [Amoxicillin-Pot Clavulanate]    Codeine Other (See Comments)    dizziness   Doxycycline Hyclate Other (See Comments)   Sulfamethoxazole-Trimethoprim Diarrhea   Tape Itching   Vitamin D Analogs    Azithromycin Rash and Hives    Outpatient Encounter Medications as of 09/14/2022  Medication Sig   acetaminophen (TYLENOL) 500 MG tablet Take 500 mg by mouth 3 (three) times daily.   amiodarone (PACERONE) 200 MG tablet Take 1 tablet (200 mg total) by mouth daily.   apixaban (ELIQUIS) 2.5 MG TABS tablet Take 1 tablet (2.5 mg total) by mouth 2 (two) times daily.   cephALEXin (KEFLEX) 500 MG capsule Take  500 mg by mouth every 12 (twelve) hours.   diclofenac Sodium (VOLTAREN) 1 % GEL Apply 4 g topically 4 (four) times daily.   doxycycline (VIBRA-TABS) 100 MG tablet Take 100 mg by mouth every 12 (twelve) hours.   ferrous sulfate 325 (65 FE) MG EC tablet Take 325 mg by mouth every other day.   gabapentin (NEURONTIN) 100 MG capsule Take 2 tabs  3 x day for neuropathy by mouth   HYDROcodone-acetaminophen (NORCO) 5-325 MG tablet Take half tab po bid prn for pain   hydroxychloroquine (PLAQUENIL) 200 MG tablet Take 200 mg by mouth daily.   metoprolol tartrate (LOPRESSOR) 25 MG tablet Take 0.5 tablets (12.5 mg total) by mouth 2 (two) times daily.   pantoprazole (PROTONIX) 40 MG tablet TAKE 1 TABLET TWICE DAILY   povidone-Iodine (BETADINE) 5 % SOLN topical solution Apply 1 Application topically as directed.  Apply to Eschars on Bilateral Toes topically every shift for Dried Eschars   predniSONE (DELTASONE) 5 MG tablet Take 1 tablet (5 mg total) by mouth daily with breakfast.   predniSONE (DELTASONE) 5 MG tablet Take 5 mg by mouth every other day. See other listing   [DISCONTINUED] Calcium Carbonate-Vit D-Min (CALCIUM 600+D3 PLUS MINERALS) 600-800 MG-UNIT CHEW Chew 1 tablet by mouth as directed.   [DISCONTINUED] hydroxychloroquine (PLAQUENIL) 200 MG tablet Take 1 tablet (200 mg total) by mouth 2 (two) times daily.   [DISCONTINUED] lidocaine 4 % Place 1 patch onto the skin daily as needed (pain).   [DISCONTINUED] Multiple Vitamin (MULTIVITAMIN WITH MINERALS) TABS tablet Take 1 tablet by mouth daily.   [DISCONTINUED] nitrofurantoin, macrocrystal-monohydrate, (MACROBID) 100 MG capsule Take 1 tab po BID  for 14 day  and then M,W and Friday once a day   No facility-administered encounter medications on file as of 09/14/2022.    Review of Systems  All other systems reviewed and are negative.   Vitals:   09/14/22 1650  BP: 106/61  Pulse: 77  Resp: 18  Temp: (!) 96.7 F (35.9 C)  SpO2: 100%  Weight: 164  lb (74.4 kg)  Height: 5' 4" (1.626 m)   Body mass index is 28.15 kg/m. Physical Exam Vitals and nursing note reviewed.  Cardiovascular:     Rate and Rhythm: Normal rate and regular rhythm.     Pulses: Normal pulses.     Heart sounds: Normal heart sounds.  Pulmonary:     Effort: Pulmonary effort is normal.     Breath sounds: Normal breath sounds.  Abdominal:     General: Abdomen is flat. Bowel sounds are normal.     Palpations: Abdomen is soft.  Skin:    General: Skin is warm and dry.     Comments: Images of skin exam below with ulcer of lower extremity and calluses on great toe.   Neurological:     Mental Status: She is alert and oriented to person, place, and time. Mental status is at baseline.          Labs reviewed: Basic Metabolic Panel: Recent Labs    07/03/22 1456 07/04/22 0557 07/05/22 0413 07/06/22 0451 09/05/22 1809  NA  --  139 138 139 139  K  --  5.2* 4.7 4.2 4.4  CL  --  107 101 107 109  CO2  --  _0 GLUCOSE  --  138* 131* 79 123*  BUN  --  21 29* 27* 34*  CREATININE  --  1.48* 1.89* 1.80* 1.57*  CALCIUM  --  8.7* 8.9 8.5* 8.2*  MG  --  1.8 1.5* 2.6*  --   PHOS 3.3  --   --   --   --    Liver Function Tests: Recent Labs    09/05/22 1809  AST 16  ALT 9  ALKPHOS 59  BILITOT 0.4  PROT 6.7  ALBUMIN 3.4*   No results for input(s): "LIPASE", "  AMYLASE" in the last 8760 hours. No results for input(s): "AMMONIA" in the last 8760 hours. CBC: Recent Labs    07/05/22 0413 07/06/22 0451 09/05/22 1809  WBC 11.0* 8.8 6.6  NEUTROABS  --   --  4.9  HGB 9.1* 8.6* 8.5*  HCT 29.8* 27.9* 28.6*  MCV 85.6 86.1 82.4  PLT 254 226 249   Cardiac Enzymes: No results for input(s): "CKTOTAL", "CKMB", "CKMBINDEX", "TROPONINI" in the last 8760 hours. BNP: Invalid input(s): "POCBNP" Lab Results  Component Value Date   HGBA1C 5.4 07/03/2022   Lab Results  Component Value Date   TSH 6.219 (H) 07/03/2022   Lab Results  Component Value Date    KGYJEHUD14 970 07/04/2022   Lab Results  Component Value Date   FOLATE >40.0 07/03/2022   Lab Results  Component Value Date   IRON 26 (L) 07/03/2022   TIBC 300 07/03/2022   FERRITIN 16 07/03/2022    Imaging and Procedures obtained prior to SNF admission: No results found.  Assessment/Plan Cellulitis left lower extremity Patient admitted for cellulitis of LLE. Continues to improve with current medication. Continue Keflex 500 mg bid and doxycycline 100 mg BID. Anemia 7.4. Continue iron supplementation. Continued multimodal pain control with gabapentin 100 mg nightly   Acute on chronic HFrEF (heart failure with reduced ejection fraction) ([place]) 3. Primary hypertension 4. Cardiomegaly 5. Congestive heart failure, unspecified HF chronicity, unspecified heart failure type ([place]) Patient without symptoms at this time, Continue metoprolol, amioadarone, and apixiban. Continue to monitor. Stable on room air.   2. Bilateral hearing loss, unspecified hearing loss type Patient refuses hearing aids.   6. Juxtarenal abdominal aortic aneurysm (AAA) without rupture ([place])  7. Atrial fibrillation with RVR ([place]) Noted on abdominal injury 05/2022. Concern this could contribute to increased mortality risk with cardiac event. Patient is DNAR.   8. Chronic obstructive pulmonary disease, unspecified COPD type ([place]) 9. Simple chronic bronchitis ([place]) 10. Chronic respiratory failure with hypoxia ([place]) Patient is stable without medications and on room air. O2 PRN. CTM.   11. Hypothyroidism, unspecified type TSH 2 months ago mildly elevated at 6.219. Given patient's age, this is an acceptable range. Continue to monitor for signs and symptosm.   12. Stage 3a chronic kidney disease (CKD) ([place]) Acute blood loss anemia Patient most recent CBC 7.4. eGFR 38 with Cr 1.32. Continue iron supplementation at this time.    13. Polymyalgia rheumatica ([place]) Patient with chronic pain Taking prednisone 5 mg for many  years. Discussed side effects, and opted for trial of taper. Plan to decrease dosing by 20% per week for the next 4 weeks then discontinue. Continue Scheduled Tylnoel. Norco BID prn, will discontinue norco if patient is not requiring daily. Discussed exercise, multimodal pain control with voltaren gel. Continue protonix at this time, will wean once no longer taking prednisone.   14. Mixed incontinence Symptoms well-controlled without interventions at this time, CTM.   15. Memory changes 16. Requires daily assistance for activities of daily living (ADL) and comfort needs Patient with significant weakness for the last year. Patient has been wheelchair bound and requires assistance with medications, transportation, meal preparation, and groceries. Discussed concern that her year of minimal of ambulation may preclude significant improvement with physical therapy, however, patient has not recently had physical therapy and could continue to improve. Requires skilled nursing level of care at this time.     Family/ staff Communication: Children, nursing  Labs/tests ordered: CBC. BMP  I spent greater than 75 minutes in  chart review, face to face interactions, with >30 minutes spent discussing goals of care and advanced care planning.   Tomasa Rand, MD, [facility] [phone]

## 2022-09-15 ENCOUNTER — Encounter: Payer: Self-pay | Admitting: Student

## 2022-09-22 LAB — CBC AND DIFFERENTIAL
HCT: 26 — AB (ref 36–46)
Hemoglobin: 7.9 — AB (ref 12.0–16.0)
Neutrophils Absolute: 2720
Platelets: 204 10*3/uL (ref 150–400)
WBC: 5.2

## 2022-09-22 LAB — CBC: RBC: 3.12 — AB (ref 3.87–5.11)

## 2022-09-27 DIAGNOSIS — I4819 Other persistent atrial fibrillation: Secondary | ICD-10-CM | POA: Diagnosis not present

## 2022-09-27 DIAGNOSIS — J410 Simple chronic bronchitis: Secondary | ICD-10-CM | POA: Diagnosis not present

## 2022-09-27 DIAGNOSIS — J9611 Chronic respiratory failure with hypoxia: Secondary | ICD-10-CM | POA: Diagnosis not present

## 2022-09-27 DIAGNOSIS — R0789 Other chest pain: Secondary | ICD-10-CM | POA: Diagnosis not present

## 2022-09-27 DIAGNOSIS — I10 Essential (primary) hypertension: Secondary | ICD-10-CM | POA: Diagnosis not present

## 2022-09-27 DIAGNOSIS — E118 Type 2 diabetes mellitus with unspecified complications: Secondary | ICD-10-CM | POA: Diagnosis not present

## 2022-09-27 DIAGNOSIS — I350 Nonrheumatic aortic (valve) stenosis: Secondary | ICD-10-CM | POA: Diagnosis not present

## 2022-09-27 DIAGNOSIS — I7142 Juxtarenal abdominal aortic aneurysm, without rupture: Secondary | ICD-10-CM | POA: Diagnosis not present

## 2022-09-27 DIAGNOSIS — I5022 Chronic systolic (congestive) heart failure: Secondary | ICD-10-CM | POA: Diagnosis not present

## 2022-09-28 ENCOUNTER — Non-Acute Institutional Stay (SKILLED_NURSING_FACILITY): Payer: Medicare Other | Admitting: Student

## 2022-09-28 ENCOUNTER — Encounter: Payer: Self-pay | Admitting: Student

## 2022-09-28 DIAGNOSIS — I83028 Varicose veins of left lower extremity with ulcer other part of lower leg: Secondary | ICD-10-CM

## 2022-09-28 DIAGNOSIS — L03116 Cellulitis of left lower limb: Secondary | ICD-10-CM

## 2022-09-28 DIAGNOSIS — B351 Tinea unguium: Secondary | ICD-10-CM | POA: Diagnosis not present

## 2022-09-28 DIAGNOSIS — L97821 Non-pressure chronic ulcer of other part of left lower leg limited to breakdown of skin: Secondary | ICD-10-CM

## 2022-09-28 DIAGNOSIS — I7091 Generalized atherosclerosis: Secondary | ICD-10-CM | POA: Diagnosis not present

## 2022-09-28 NOTE — Progress Notes (Unsigned)
Location:  Other [place].  Nursing Home Room Number: [place] of Service:  SNF [phone]) Provider:  Dr. Dewayne Shorter   Patient Care Team: [name], MD as PCP - General (Internal Medicine) Christene Lye, MD as Consulting Physician (General Surgery) Alena Bills, [facility] as Pharmacist (Pharmacist)  Extended Emergency Contact Information Primary Emergency Contact: Pew,[name] Address: [address]          [place], [place] [address] Johnnette Litter of [place] Phone: [phone] Mobile Phone: [phone] Relation: Son Secondary Emergency Contact: [name] Address: [address]          [place], [place] [address] Johnnette Litter of [place] Mobile Phone: [phone] Relation: Daughter  Code Status:  DNR Goals of care: Advanced Directive information    09/28/2022   10:43 AM  Advanced Directives  Does Patient Have a Medical Advance Directive? Yes  Type of Paramedic of [place];Out of facility DNR (pink MOST or yellow form)  Does patient want to make changes to medical advance directive? No - Patient declined  Copy of [place] in Chart? Yes - validated most recent copy scanned in chart (See row information)     Chief Complaint  Patient presents with   Acute Visit    Leg Wound    HPI:  Pt is a 89 y.o. female seen today for an acute visit for wound check. Patient had a bruise present that she bumped with transfer with new skin tear. Both wounds have caused significant pain.    Past Medical History:  Diagnosis Date   Acute colitis on CT 03/07/2021   Arthritis 1940   Asthma    Bowel trouble    Cancer ([place]) 2006   DCIS left breast   Hypertension 1940   Incisional hernia 2014   Other primary ovarian failure 09/21/2017   Personal history of radiation therapy    Polymyalgia ([place])    Thyroid disorder    Vitiligo 2012   Past Surgical History:  Procedure Laterality Date   BREAST BIOPSY  Left 2006   +   BREAST SURGERY Left 2006   lumpectomy   [place]   COLON SURGERY  2004   COLONOSCOPY  2014   Dr. Candace Cruise   EYE SURGERY  2002   FOOT SURGERY  1996   HERNIA REPAIR  2013   left breast biopsy   2011    Allergies  Allergen Reactions   Aspirin Itching and Other (See Comments)    Dizzy/passing out   Augmentin [Amoxicillin-Pot Clavulanate]    Codeine Other (See Comments)    dizziness   Doxycycline Hyclate Other (See Comments)   Sulfamethoxazole-Trimethoprim Diarrhea   Tape Itching   Vitamin D Analogs    Azithromycin Rash and Hives    Outpatient Encounter Medications as of 09/28/2022  Medication Sig   acetaminophen (TYLENOL) 500 MG tablet Take 500 mg by mouth 3 (three) times daily.   amiodarone (PACERONE) 100 MG tablet Take 100 mg by mouth daily.   apixaban (ELIQUIS) 2.5 MG TABS tablet Take 1 tablet (2.5 mg total) by mouth 2 (two) times daily.   collagenase (SANTYL) 250 UNIT/GM ointment Apply to left lower extremity wound topically every 72 hours for wound   diclofenac Sodium (VOLTAREN) 1 % GEL Apply 4 g topically 4 (four) times daily.   ferrous sulfate 325 (65 FE) MG EC tablet Take 325 mg by mouth every other day.   gabapentin (NEURONTIN) 100 MG capsule  Take 2 tabs 3 x day for neuropathy by mouth   hydroxychloroquine (PLAQUENIL) 200 MG tablet Take 200 mg by mouth daily.   Infant Care Products [facility]) OINT Apply to right hip wound topically one time daily   metoprolol tartrate (LOPRESSOR) 25 MG tablet Take 0.5 tablets (12.5 mg total) by mouth 2 (two) times daily.   pantoprazole (PROTONIX) 40 MG tablet TAKE 1 TABLET TWICE DAILY   povidone-Iodine (BETADINE) 5 % SOLN topical solution Apply 1 Application topically as directed.  Apply to Eschars on Bilateral Toes topically every shift for Dried Eschars   predniSONE (DELTASONE) 5 MG tablet Take 5 mg by mouth every other day.   [DISCONTINUED] amiodarone (PACERONE) 200 MG tablet Take 1 tablet (200 mg total) by  mouth daily.   [DISCONTINUED] cephALEXin (KEFLEX) 500 MG capsule Take 500 mg by mouth every 12 (twelve) hours.   [DISCONTINUED] doxycycline (VIBRA-TABS) 100 MG tablet Take 100 mg by mouth every 12 (twelve) hours.   [DISCONTINUED] HYDROcodone-acetaminophen (NORCO) 5-325 MG tablet Take half tab po bid prn for pain   [DISCONTINUED] predniSONE (DELTASONE) 5 MG tablet Take 1 tablet (5 mg total) by mouth daily with breakfast. (Patient taking differently: Take 5 mg by mouth every other day.)   [DISCONTINUED] predniSONE (DELTASONE) 5 MG tablet Take 5 mg by mouth every other day. See other listing   No facility-administered encounter medications on file as of 09/28/2022.    Review of Systems  All other systems reviewed and are negative.   Immunization History  Administered Date(s) Administered   Fluad Quad(high Dose 65+) 07/05/2022   Influenza Inj Mdck Quad Pf 09/21/2017, 09/16/2019   PFIZER Comirnaty(Gray Top)Covid-19 Tri-Sucrose Vaccine 03/16/2021   PFIZER(Purple Top)SARS-COV-2 Vaccination 11/01/2019, 11/22/2019   Pertinent  Health Maintenance Due  Topic Date Due   FOOT EXAM  05/10/2022   OPHTHALMOLOGY EXAM  05/10/2022   HEMOGLOBIN A1C  01/02/2023   INFLUENZA VACCINE  Completed   DEXA SCAN  Completed      07/05/2022    9:00 PM 07/06/2022    8:00 AM 08/30/2022    9:27 AM 09/05/2022    6:07 PM 09/06/2022    6:03 AM  Fall Risk  Falls in the past year?   1    Was there an injury with Fall?   0    Fall Risk Category Calculator   2    Fall Risk Category   Moderate    Patient Fall Risk Level High fall risk High fall risk  Low fall risk Low fall risk   Functional Status Survey:    Vitals:   09/28/22 1031  BP: 125/75  Pulse: 90  Resp: 20  Temp: (!) 97.3 F (36.3 C)  SpO2: 100%  Weight: 166 lb (75.3 kg)  Height: '5\' 4"'$  (1.626 m)   Body mass index is 28.49 kg/m. Physical Exam Constitutional:      Appearance: Normal appearance.  Skin:    Comments: Photo below  Neurological:      Mental Status: She is alert.        Labs reviewed: Recent Labs    07/03/22 1456 07/04/22 0557 07/05/22 0413 07/06/22 0451 09/05/22 1809  NA  --  139 138 139 139  K  --  5.2* 4.7 4.2 4.4  CL  --  107 101 107 109  CO2  --  '23 27 27 23  '$ GLUCOSE  --  138* 131* 79 123*  BUN  --  21 29* 27* 34*  CREATININE  --  1.48* 1.89* 1.80* 1.57*  CALCIUM  --  8.7* 8.9 8.5* 8.2*  MG  --  1.8 1.5* 2.6*  --   PHOS 3.3  --   --   --   --    Recent Labs    09/05/22 1809  AST 16  ALT 9  ALKPHOS 59  BILITOT 0.4  PROT 6.7  ALBUMIN 3.4*   Recent Labs    07/05/22 0413 07/06/22 0451 09/05/22 1809 09/22/22 0000  WBC 11.0* 8.8 6.6 5.2  NEUTROABS  --   --  4.9 2,720.00  HGB 9.1* 8.6* 8.5* 7.9*  HCT 29.8* 27.9* 28.6* 26*  MCV 85.6 86.1 82.4  --   PLT 254 226 249 204   Lab Results  Component Value Date   TSH 6.219 (H) 07/03/2022   Lab Results  Component Value Date   HGBA1C 5.4 07/03/2022   Lab Results  Component Value Date   CHOL 117 07/03/2022   HDL 38 (L) 07/03/2022   LDLCALC 63 07/03/2022   TRIG 82 07/03/2022   CHOLHDL 3.1 07/03/2022    Significant Diagnostic Results in last 30 days:  DG Foot Complete Left  Result Date: 09/05/2022 CLINICAL DATA:  Swelling and erythema EXAM: LEFT FOOT - COMPLETE 3 VIEW COMPARISON:  None Available. FINDINGS: Osseous structures are osteopenic. No acute fracture, dislocation or subluxation. Degenerative changes identified of the midfoot with tarsometatarsal osteophyte formation. There is distal interphalangeal degenerative changes second through fifth toes. Soft tissue swelling noted overlying the metatarsals. No bony destructive process. Posterior and plantar calcaneal spurs. IMPRESSION: Degenerative changes. Calcaneal spurs. Soft tissue swelling. No acute osseous abnormalities. Electronically Signed   By: Sammie Bench M.D.   On: 09/05/2022 19:18   US Venous Img Lower Unilateral Left  Result Date: 09/05/2022 CLINICAL DATA:  Erythema  and edema EXAM: Left LOWER EXTREMITY VENOUS DOPPLER ULTRASOUND TECHNIQUE: Gray-scale sonography with graded compression, as well as color Doppler and duplex ultrasound were performed to evaluate the lower extremity deep venous systems from the level of the common femoral vein and including the common femoral, femoral, profunda femoral, popliteal and calf veins including the posterior tibial, peroneal and gastrocnemius veins when visible. The superficial great saphenous vein was also interrogated. Spectral Doppler was utilized to evaluate flow at rest and with distal augmentation maneuvers in the common femoral, femoral and popliteal veins. COMPARISON:  None Available. FINDINGS: Contralateral Common Femoral Vein: Respiratory phasicity is normal and symmetric with the symptomatic side. No evidence of thrombus. Normal compressibility. Common Femoral Vein: No evidence of thrombus. Normal compressibility, respiratory phasicity and response to augmentation. Saphenofemoral Junction: No evidence of thrombus. Normal compressibility and flow on color Doppler imaging. Profunda Femoral Vein: No evidence of thrombus. Normal compressibility and flow on color Doppler imaging. Femoral Vein: No evidence of thrombus. Normal compressibility, respiratory phasicity and response to augmentation. Popliteal Vein: No evidence of thrombus. Normal compressibility, respiratory phasicity and response to augmentation. Calf Veins: Peroneal veins well visualized. Posterior tibial vein normally compressible. Superficial Great Saphenous Vein: No evidence of thrombus. Normal compressibility. Venous Reflux:  None. Other Findings:  None. IMPRESSION: No evidence of deep venous thrombosis. Electronically Signed   By: Sammie Bench M.D.   On: 09/05/2022 19:16    Assessment/Plan 1. Cellulitis of left lower extremity 2. Venous stasis ulcer of other part of left lower leg limited to breakdown of skin, unspecified whether varicose veins present  ([place]) Healing wound now with ulceration. No evidence of cellulitis at this time. Continue wound care with nursing.  New skin tear well- approximated without need for debridement at this time.     Family/ staff Communication: nursing  Labs/tests ordered:  none

## 2022-10-10 ENCOUNTER — Other Ambulatory Visit: Payer: Self-pay | Admitting: Internal Medicine

## 2022-10-11 ENCOUNTER — Non-Acute Institutional Stay (SKILLED_NURSING_FACILITY): Payer: Medicare Other | Admitting: Nurse Practitioner

## 2022-10-11 ENCOUNTER — Encounter: Payer: Self-pay | Admitting: Nurse Practitioner

## 2022-10-11 DIAGNOSIS — M150 Primary generalized (osteo)arthritis: Secondary | ICD-10-CM | POA: Diagnosis not present

## 2022-10-11 DIAGNOSIS — I83028 Varicose veins of left lower extremity with ulcer other part of lower leg: Secondary | ICD-10-CM | POA: Diagnosis not present

## 2022-10-11 DIAGNOSIS — J449 Chronic obstructive pulmonary disease, unspecified: Secondary | ICD-10-CM | POA: Diagnosis not present

## 2022-10-11 DIAGNOSIS — R5381 Other malaise: Secondary | ICD-10-CM

## 2022-10-11 DIAGNOSIS — I509 Heart failure, unspecified: Secondary | ICD-10-CM

## 2022-10-11 DIAGNOSIS — I10 Essential (primary) hypertension: Secondary | ICD-10-CM

## 2022-10-11 DIAGNOSIS — M87052 Idiopathic aseptic necrosis of left femur: Secondary | ICD-10-CM | POA: Diagnosis not present

## 2022-10-11 DIAGNOSIS — L97821 Non-pressure chronic ulcer of other part of left lower leg limited to breakdown of skin: Secondary | ICD-10-CM | POA: Diagnosis not present

## 2022-10-11 DIAGNOSIS — D649 Anemia, unspecified: Secondary | ICD-10-CM | POA: Diagnosis not present

## 2022-10-11 NOTE — Progress Notes (Signed)
Location:  Other Nursing Home Room Number: 102[address] Place of Service:  SNF (31)  [name], MD  Patient Care Team: [name], MD as PCP - General (Internal Medicine) Christene Lye, MD as Consulting Physician (General Surgery) Alena Bills, [facility] as Pharmacist (Pharmacist)  Extended Emergency Contact Information Primary Emergency Contact: [place],[name] Address: [address]          [place], [place] [address] Johnnette Litter of [place] Phone: [phone] Mobile Phone: [phone] Relation: Son Secondary Emergency Contact: [name] Address: [address]          [place],  [address] Johnnette Litter of [place] Mobile Phone: [phone] Relation: Daughter  Goals of care: Advanced Directive information    10/11/2022   12:44 PM  Advanced Directives  Does Patient Have a Medical Advance Directive? Yes  Type of Advance Directive [place]  Does patient want to make changes to medical advance directive? No - Patient declined  Copy of [place] in Chart? Yes - validated most recent copy scanned in chart (See row information)     Chief Complaint  Patient presents with   Medical Management of Chronic Issues    Routine follow up    Immunizations    Tdap, shingrix vaccine, pneumonia vaccine and COVID booster due.   Quality Metric Gaps    Annual wellness visit,foot exam and eye exam due.    HPI:  Pt is a 89 y.o. female seen today for medical management of chronic disease.   Reports she rolled over and felt a pop. She has OA in both hip and reports her joints will pop.  She has hx of avascular necrosis stage IV noted while in the ED. Thought to be due to chronic prednisone use.  She declined surgery in the past and then was not consider a candidate.    She has been here at [facility] due to cellulitis in left leg and having a lot of pain with this. She has gabapentin and tylenol scheduled for pain. Does not feel  like her pain regimen.   She has been titrated off prednisone and hydrocodone-apap.   She has not been able to stand for a long time. Gets up with a lift or family would pick her up.  She was hospitalized in Oct and would not go to rehab after this hospitalization and got very weak since then.   GERD- controlled on protonix.   Anemia- worse on recent labs, on iron supplement every other day.     Past Medical History:  Diagnosis Date   Acute colitis on CT 03/07/2021   Arthritis 1940   Asthma    Bowel trouble    Cancer ([place]) 2006   DCIS left breast   Hypertension 1940   Incisional hernia 2014   Other primary ovarian failure 09/21/2017   Personal history of radiation therapy    Polymyalgia ([place])    Thyroid disorder    Vitiligo 2012   Past Surgical History:  Procedure Laterality Date   BREAST BIOPSY Left 2006   +   BREAST SURGERY Left 2006   lumpectomy   [place]  2004   COLONOSCOPY  2014   Dr. Candace Cruise   EYE SURGERY  2002   FOOT SURGERY  1996   HERNIA REPAIR  2013   left breast biopsy   2011    Allergies  Allergen Reactions   Aspirin Itching and Other (See Comments)  Dizzy/passing out   Augmentin [Amoxicillin-Pot Clavulanate]    Codeine Other (See Comments)    dizziness   Doxycycline Hyclate Other (See Comments)   Sulfamethoxazole-Trimethoprim Diarrhea   Tape Itching   Vitamin D Analogs    Azithromycin Rash and Hives    Outpatient Encounter Medications as of 10/11/2022  Medication Sig   acetaminophen (TYLENOL) 500 MG tablet Take 500 mg by mouth 3 (three) times daily.   acetaminophen (TYLENOL) 500 MG tablet Take 500 mg by mouth every 8 (eight) hours as needed.   amiodarone (PACERONE) 100 MG tablet Take 100 mg by mouth daily.   apixaban (ELIQUIS) 2.5 MG TABS tablet Take 1 tablet (2.5 mg total) by mouth 2 (two) times daily.   diclofenac Sodium (VOLTAREN) 1 % GEL Apply 4 g topically 4 (four) times daily.   ferrous sulfate 325 (65 FE)  MG EC tablet Take 325 mg by mouth every other day.   gabapentin (NEURONTIN) 100 MG capsule Take 2 tabs 3 x day for neuropathy by mouth   hydroxychloroquine (PLAQUENIL) 200 MG tablet Take 200 mg by mouth daily.   Infant Care Products [facility]) OINT Apply to right hip wound topically one time daily   metoprolol tartrate (LOPRESSOR) 25 MG tablet Take 0.5 tablets (12.5 mg total) by mouth 2 (two) times daily.   pantoprazole (PROTONIX) 40 MG tablet TAKE 1 TABLET TWICE DAILY   povidone-Iodine (BETADINE) 5 % SOLN topical solution Apply 1 Application topically as directed.  Apply to Eschars on Bilateral Toes topically every shift for Dried Eschars   [DISCONTINUED] collagenase (SANTYL) 250 UNIT/GM ointment Apply to left lower extremity wound topically every 72 hours for wound   [DISCONTINUED] predniSONE (DELTASONE) 5 MG tablet Take 5 mg by mouth every other day.   No facility-administered encounter medications on file as of 10/11/2022.    Review of Systems  Constitutional:  Negative for activity change, appetite change, fatigue and unexpected weight change.  HENT:  Negative for congestion and hearing loss.   Eyes: Negative.   Respiratory:  Negative for cough and shortness of breath.   Cardiovascular:  Negative for chest pain, palpitations and leg swelling.  Gastrointestinal:  Negative for abdominal pain, constipation and diarrhea.  Genitourinary:  Negative for difficulty urinating and dysuria.  Musculoskeletal:  Positive for arthralgias. Negative for myalgias.  Skin:  Negative for color change and wound.  Neurological:  Negative for dizziness and weakness.  Psychiatric/Behavioral:  Negative for agitation, behavioral problems and confusion.      Immunization History  Administered Date(s) Administered   Fluad Quad(high Dose 65+) 07/05/2022   Influenza Inj Mdck Quad Pf 09/21/2017, 09/16/2019   PFIZER Comirnaty(Gray Top)Covid-19 Tri-Sucrose Vaccine 03/16/2021   PFIZER(Purple Top)SARS-COV-2  Vaccination 11/01/2019, 11/22/2019   Pertinent  Health Maintenance Due  Topic Date Due   FOOT EXAM  05/10/2022   OPHTHALMOLOGY EXAM  05/10/2022   HEMOGLOBIN A1C  01/02/2023   INFLUENZA VACCINE  Completed   DEXA SCAN  Completed      07/05/2022    9:00 PM 07/06/2022    8:00 AM 08/30/2022    9:27 AM 09/05/2022    6:07 PM 09/06/2022    6:03 AM  Fall Risk  Falls in the past year?   1    Was there an injury with Fall?   0    Fall Risk Category Calculator   2    Fall Risk Category (Retired)   Moderate    (RETIRED) Patient Fall Risk Level High fall risk High fall risk  Low fall risk Low fall risk   Functional Status Survey:    Vitals:   10/11/22 1239  BP: 114/71  Pulse: 69  Resp: 20  Temp: (!) 97.4 F (36.3 C)  SpO2: 99%  Weight: 170 lb 6.4 oz (77.3 kg)  Height: '5\' 4"'$  (1.626 m)   Body mass index is 29.25 kg/m. Physical Exam Constitutional:      General: She is not in acute distress.    Appearance: She is well-developed. She is not diaphoretic.  HENT:     Head: Normocephalic and atraumatic.     Mouth/Throat:     Pharynx: No oropharyngeal exudate.  Eyes:     Conjunctiva/sclera: Conjunctivae normal.     Pupils: Pupils are equal, round, and reactive to light.  Cardiovascular:     Rate and Rhythm: Normal rate and regular rhythm.     Heart sounds: Normal heart sounds.  Pulmonary:     Effort: Pulmonary effort is normal.     Breath sounds: Normal breath sounds.  Abdominal:     General: Bowel sounds are normal.     Palpations: Abdomen is soft.  Musculoskeletal:     Cervical back: Normal range of motion and neck supple.     Right hip: Tenderness present. Decreased range of motion.     Left hip: Tenderness and crepitus present. Decreased range of motion.     Right lower leg: No edema.     Left lower leg: No edema.  Skin:    General: Skin is warm and dry.  Neurological:     Mental Status: She is alert.  Psychiatric:        Mood and Affect: Mood normal.     Labs  reviewed: Recent Labs    07/03/22 1456 07/04/22 0557 07/05/22 0413 07/06/22 0451 09/05/22 1809  NA  --  139 138 139 139  K  --  5.2* 4.7 4.2 4.4  CL  --  107 101 107 109  CO2  --  '23 27 27 23  '$ GLUCOSE  --  138* 131* 79 123*  BUN  --  21 29* 27* 34*  CREATININE  --  1.48* 1.89* 1.80* 1.57*  CALCIUM  --  8.7* 8.9 8.5* 8.2*  MG  --  1.8 1.5* 2.6*  --   PHOS 3.3  --   --   --   --    Recent Labs    09/05/22 1809  AST 16  ALT 9  ALKPHOS 59  BILITOT 0.4  PROT 6.7  ALBUMIN 3.4*   Recent Labs    07/05/22 0413 07/06/22 0451 09/05/22 1809 09/22/22 0000  WBC 11.0* 8.8 6.6 5.2  NEUTROABS  --   --  4.9 2,720.00  HGB 9.1* 8.6* 8.5* 7.9*  HCT 29.8* 27.9* 28.6* 26*  MCV 85.6 86.1 82.4  --   PLT 254 226 249 204   Lab Results  Component Value Date   TSH 6.219 (H) 07/03/2022   Lab Results  Component Value Date   HGBA1C 5.4 07/03/2022   Lab Results  Component Value Date   CHOL 117 07/03/2022   HDL 38 (L) 07/03/2022   LDLCALC 63 07/03/2022   TRIG 82 07/03/2022   CHOLHDL 3.1 07/03/2022    Significant Diagnostic Results in last 30 days:  No results found.  Assessment/Plan 1. Primary hypertension -Blood pressure well controlled, goal bp <140/90 Continue current medications and dietary modifications follow metabolic panel  2. Congestive heart failure, unspecified HF chronicity, unspecified heart failure type (  [place]) -stable, without worsening shortness of breath, edema or chest pains.  3. Chronic obstructive pulmonary disease, unspecified COPD type ([place]) -stable, not currently on any routine medications   4. Primary generalized (osteo)arthritis -ongoing, continues on tylenol PRN   5. Venous stasis ulcer of other part of left lower leg limited to breakdown of skin, unspecified whether varicose veins present ([place]) Ongoing monitoring by nursing with frequent dressing changes, no signs of acute infection at this time. Continues with santyl.   6. Avascular necrosis of  bone of hip, left (H[facility]) -ongoing pain and decrease ROM, she is not able to weight bear/stand.   7. Debility Significant debility after recent hospitalization, now mostly bed and chair bound. Continues to facility for supportive care and will likely be long term.   8. Anemia, unspecified type Continues on iron, will follow up [place]. [place], [facility] [phone]

## 2022-10-13 LAB — CBC AND DIFFERENTIAL
HCT: 29 — AB (ref 36–46)
Hemoglobin: 9 — AB (ref 12.0–16.0)
Neutrophils Absolute: 2372
Platelets: 230 10*3/uL (ref 150–400)
WBC: 4.9

## 2022-10-13 LAB — CBC: RBC: 3.28 — AB (ref 3.87–5.11)

## 2022-10-13 LAB — TSH: TSH: 22.1 — AB (ref 0.41–5.90)

## 2022-10-19 ENCOUNTER — Encounter: Payer: Self-pay | Admitting: Student

## 2022-10-19 ENCOUNTER — Non-Acute Institutional Stay (SKILLED_NURSING_FACILITY): Payer: Medicare Other | Admitting: Student

## 2022-10-19 DIAGNOSIS — L97821 Non-pressure chronic ulcer of other part of left lower leg limited to breakdown of skin: Secondary | ICD-10-CM

## 2022-10-19 DIAGNOSIS — I83028 Varicose veins of left lower extremity with ulcer other part of lower leg: Secondary | ICD-10-CM | POA: Diagnosis not present

## 2022-10-19 DIAGNOSIS — L97519 Non-pressure chronic ulcer of other part of right foot with unspecified severity: Secondary | ICD-10-CM | POA: Diagnosis not present

## 2022-10-19 DIAGNOSIS — S91302A Unspecified open wound, left foot, initial encounter: Secondary | ICD-10-CM

## 2022-10-19 DIAGNOSIS — E440 Moderate protein-calorie malnutrition: Secondary | ICD-10-CM | POA: Diagnosis not present

## 2022-10-19 DIAGNOSIS — L97529 Non-pressure chronic ulcer of other part of left foot with unspecified severity: Secondary | ICD-10-CM

## 2022-10-19 NOTE — Progress Notes (Unsigned)
Location:  Other [place].  Nursing Home Room Number: [facility] of Service:  SNF [phone]) Provider:  Dr. Dewayne Shorter  RJJ:OACZ, Timoteo Gaul, MD  Patient Care Team: [name], MD as PCP - General (Internal Medicine) Christene Lye, MD as Consulting Physician (General Surgery) Alena Bills, [facility] as Pharmacist (Pharmacist)  Extended Emergency Contact Information Primary Emergency Contact: Walski,[name] Address: [address]          [place], [place] [address] Johnnette Litter of [place] Phone: [phone] Mobile Phone: [phone] Relation: Son Secondary Emergency Contact: [name] Address: [address]          [place], [place] [address] Johnnette Litter of [place] Mobile Phone: [phone] Relation: Daughter  Code Status:  DNR Goals of care: Advanced Directive information    10/19/2022   11:23 AM  Advanced Directives  Does Patient Have a Medical Advance Directive? Yes  Type of Paramedic of [place];Out of facility DNR (pink MOST or yellow form)  Does patient want to make changes to medical advance directive? No - Patient declined  Copy of [place] in Chart? Yes - validated most recent copy scanned in chart (See row information)     Chief Complaint  Patient presents with   Acute Visit    Skin Tear    HPI:  Pt is a 89 y.o. female seen today for an acute visit for wound check. Patient states she is doing just fine in general. Per SW patient will likely transition to facility for long term care.    Past Medical History:  Diagnosis Date   Acute colitis on CT 03/07/2021   Arthritis 1940   Asthma    Bowel trouble    Cancer ([place]) 2006   DCIS left breast   Hypertension 1940   Incisional hernia 2014   Other primary ovarian failure 09/21/2017   Personal history of radiation therapy    Polymyalgia ([place])    Thyroid disorder    Vitiligo 2012   Past Surgical History:  Procedure Laterality  Date   BREAST BIOPSY Left 2006   +   BREAST SURGERY Left 2006   lumpectomy   [place]   COLON SURGERY  2004   COLONOSCOPY  2014   Dr. Candace Cruise   EYE SURGERY  2002   FOOT SURGERY  1996   HERNIA REPAIR  2013   left breast biopsy   2011    Allergies  Allergen Reactions   Aspirin Itching and Other (See Comments)    Dizzy/passing out   Augmentin [Amoxicillin-Pot Clavulanate]    Codeine Other (See Comments)    dizziness   Doxycycline Hyclate Other (See Comments)   Sulfamethoxazole-Trimethoprim Diarrhea   Tape Itching   Vitamin D Analogs    Azithromycin Rash and Hives    Outpatient Encounter Medications as of 10/19/2022  Medication Sig   acetaminophen (TYLENOL) 500 MG tablet Take 500 mg by mouth 3 (three) times daily.   amiodarone (PACERONE) 100 MG tablet Take 100 mg by mouth daily.   apixaban (ELIQUIS) 2.5 MG TABS tablet Take 1 tablet (2.5 mg total) by mouth 2 (two) times daily.   diclofenac Sodium (VOLTAREN) 1 % GEL Apply 4 g topically 4 (four) times daily.   ferrous sulfate 325 (65 FE) MG EC tablet Take 325 mg by mouth every other day.   gabapentin (NEURONTIN) 100 MG capsule Take 2 tabs 3 x day for neuropathy by mouth   hydroxychloroquine (PLAQUENIL)  200 MG tablet Take 200 mg by mouth daily.   Infant Care Products [facility]) OINT Apply to right hip wound topically one time daily   metoprolol tartrate (LOPRESSOR) 25 MG tablet Take 0.5 tablets (12.5 mg total) by mouth 2 (two) times daily.   pantoprazole (PROTONIX) 40 MG tablet TAKE 1 TABLET TWICE DAILY   [DISCONTINUED] acetaminophen (TYLENOL) 500 MG tablet Take 500 mg by mouth every 8 (eight) hours as needed.   [DISCONTINUED] povidone-Iodine (BETADINE) 5 % SOLN topical solution Apply 1 Application topically as directed.  Apply to Eschars on Bilateral Toes topically every shift for Dried Eschars   No facility-administered encounter medications on file as of 10/19/2022.    Review of Systems  All other systems reviewed  and are negative.   Immunization History  Administered Date(s) Administered   Fluad Quad(high Dose 65+) 07/05/2022   Influenza Inj Mdck Quad Pf 09/21/2017, 09/16/2019   PFIZER Comirnaty(Gray Top)Covid-19 Tri-Sucrose Vaccine 03/16/2021   PFIZER(Purple Top)SARS-COV-2 Vaccination 11/01/2019, 11/22/2019   PPD Test 09/22/2022   Pertinent  Health Maintenance Due  Topic Date Due   FOOT EXAM  05/10/2022   OPHTHALMOLOGY EXAM  05/10/2022   HEMOGLOBIN A1C  01/02/2023   INFLUENZA VACCINE  Completed   DEXA SCAN  Completed      07/05/2022    9:00 PM 07/06/2022    8:00 AM 08/30/2022    9:27 AM 09/05/2022    6:07 PM 09/06/2022    6:03 AM  Fall Risk  Falls in the past year?   1    Was there an injury with Fall?   0    Fall Risk Category Calculator   2    Fall Risk Category (Retired)   Moderate    (RETIRED) Patient Fall Risk Level High fall risk High fall risk  Low fall risk Low fall risk   Functional Status Survey:    Vitals:   10/19/22 1110  BP: 118/75  Pulse: 97  Resp: 16  Temp: 97.7 F (36.5 C)  SpO2: 98%  Weight: 169 lb 6.4 oz (76.8 kg)  Height: '5\' 4"'$  (1.626 m)   Body mass index is 29.08 kg/m. Physical Exam Vitals reviewed.  Cardiovascular:     Rate and Rhythm: Normal rate.     Pulses: Normal pulses.  Pulmonary:     Effort: Pulmonary effort is normal.  Skin:    Comments: Image below  Neurological:     Mental Status: She is alert and oriented to person, place, and time.          Labs reviewed: Recent Labs    07/03/22 1456 07/04/22 0557 07/05/22 0413 07/06/22 0451 09/05/22 1809  NA  --  139 138 139 139  K  --  5.2* 4.7 4.2 4.4  CL  --  107 101 107 109  CO2  --  '23 27 27 23  '$ GLUCOSE  --  138* 131* 79 123*  BUN  --  21 29* 27* 34*  CREATININE  --  1.48* 1.89* 1.80* 1.57*  CALCIUM  --  8.7* 8.9 8.5* 8.2*  MG  --  1.8 1.5* 2.6*  --   PHOS 3.3  --   --   --   --    Recent Labs    09/05/22 1809  AST 16  ALT 9  ALKPHOS 59  BILITOT 0.4  PROT 6.7   ALBUMIN 3.4*   Recent Labs    07/05/22 0413 07/06/22 0451 09/05/22 1809 09/22/22 0000 10/13/22 0000  WBC 11.0*  8.8 6.6 5.2 4.9  NEUTROABS  --   --  4.9 2,720.00 2,372.00  HGB 9.1* 8.6* 8.5* 7.9* 9.0*  HCT 29.8* 27.9* 28.6* 26* 29*  MCV 85.6 86.1 82.4  --   --   PLT 254 226 249 204 230   Lab Results  Component Value Date   TSH 22.10 (A) 10/13/2022   Lab Results  Component Value Date   HGBA1C 5.4 07/03/2022   Lab Results  Component Value Date   CHOL 117 07/03/2022   HDL 38 (L) 07/03/2022   LDLCALC 63 07/03/2022   TRIG 82 07/03/2022   CHOLHDL 3.1 07/03/2022    Significant Diagnostic Results in last 30 days:  No results found.  Assessment/Plan Venous stasis ulcer of other part of left lower leg limited to breakdown of skin, unspecified whether varicose veins present (HCC)  Moderate protein-calorie malnutrition ([place]), Chronic Patient with venous stasis ulcer. Numerous wounds on bilateral lower extremities. Ulcers concerning for venous eitology. Will plan to order ABI to determine current vascular health. Increased slough on ulcer. Will apply santyl, calcium alginate to heel and left leg. Skin wound apply xeroform and proximel.    Family/ staff Communication: nursing and woundcare team  Labs/tests ordered:  ABI bilateral LE.

## 2022-10-24 DIAGNOSIS — L03116 Cellulitis of left lower limb: Secondary | ICD-10-CM | POA: Diagnosis not present

## 2022-10-24 DIAGNOSIS — D696 Thrombocytopenia, unspecified: Secondary | ICD-10-CM | POA: Diagnosis not present

## 2022-10-24 LAB — CBC AND DIFFERENTIAL
HCT: 31 — AB (ref 36–46)
Hemoglobin: 9.9 — AB (ref 12.0–16.0)
Neutrophils Absolute: 2207
Platelets: 208 10*3/uL (ref 150–400)
WBC: 5.9

## 2022-10-24 LAB — CBC: RBC: 3.44 — AB (ref 3.87–5.11)

## 2022-11-07 ENCOUNTER — Non-Acute Institutional Stay (SKILLED_NURSING_FACILITY): Payer: Medicare Other | Admitting: Student

## 2022-11-07 DIAGNOSIS — R197 Diarrhea, unspecified: Secondary | ICD-10-CM | POA: Diagnosis not present

## 2022-11-07 NOTE — Progress Notes (Unsigned)
Location:  Other Nursing Home Room Number: [facility] of Service:  SNF [phone]) Provider:  Evelina Dun, Timoteo Gaul, MD  Patient Care Team: [name], MD as PCP - General (Internal Medicine) Christene Lye, MD as Consulting Physician (General Surgery) Alena Bills, [facility] as Pharmacist (Pharmacist)  Extended Emergency Contact Information Primary Emergency Contact: Waner,[name] Address: [address]          [place], [place] [address] Johnnette Litter of [place] Phone: [phone] Mobile Phone: [phone] Relation: Son Secondary Emergency Contact: [name] Address: [address]          [place], [place] [address] Johnnette Litter of [place] Mobile Phone: [phone] Relation: Daughter  Code Status:  DNR Goals of care: Advanced Directive information    10/19/2022   11:23 AM  Advanced Directives  Does Patient Have a Medical Advance Directive? Yes  Type of Paramedic of [place];Out of facility DNR (pink MOST or yellow form)  Does patient want to make changes to medical advance directive? No - Patient declined  Copy of [place] in Chart? Yes - validated most recent copy scanned in chart (See row information)     Chief Complaint  Patient presents with   Emesis   Diarrhea    HPI:  Pt is a 89 y.o. female seen today for an acute visit for diarrhea which started yesterday. Patient  Has vomited and had multiple episodes of diarrhea. She is drinking plenty of fluids and ate breakfast this morning.   Past Medical History:  Diagnosis Date   Acute colitis on CT 03/07/2021   Arthritis 1940   Asthma    Bowel trouble    Cancer ([place]) 2006   DCIS left breast   Hypertension 1940   Incisional hernia 2014   Other primary ovarian failure 09/21/2017   Personal history of radiation therapy    Polymyalgia ([place])    Thyroid disorder    Vitiligo 2012   Past Surgical History:  Procedure Laterality Date   BREAST  BIOPSY Left 2006   +   BREAST SURGERY Left 2006   lumpectomy   [place]   COLON SURGERY  2004   COLONOSCOPY  2014   Dr. Candace Cruise   EYE SURGERY  2002   FOOT SURGERY  1996   HERNIA REPAIR  2013   left breast biopsy   2011    Allergies  Allergen Reactions   Aspirin Itching and Other (See Comments)    Dizzy/passing out   Augmentin [Amoxicillin-Pot Clavulanate]    Codeine Other (See Comments)    dizziness   Doxycycline Hyclate Other (See Comments)   Sulfamethoxazole-Trimethoprim Diarrhea   Tape Itching   Vitamin D Analogs    Azithromycin Rash and Hives    Outpatient Encounter Medications as of 11/07/2022  Medication Sig   acetaminophen (TYLENOL) 500 MG tablet Take 500 mg by mouth 3 (three) times daily.   amiodarone (PACERONE) 100 MG tablet Take 100 mg by mouth daily.   apixaban (ELIQUIS) 2.5 MG TABS tablet Take 1 tablet (2.5 mg total) by mouth 2 (two) times daily.   diclofenac Sodium (VOLTAREN) 1 % GEL Apply 4 g topically 4 (four) times daily.   ferrous sulfate 325 (65 FE) MG EC tablet Take 325 mg by mouth every other day.   gabapentin (NEURONTIN) 100 MG capsule Take 2 tabs 3 x day for neuropathy by mouth   hydroxychloroquine (PLAQUENIL) 200 MG tablet Take 200 mg by mouth  daily.   Infant Care Products [facility]) OINT Apply to right hip wound topically one time daily   metoprolol tartrate (LOPRESSOR) 25 MG tablet Take 0.5 tablets (12.5 mg total) by mouth 2 (two) times daily.   pantoprazole (PROTONIX) 40 MG tablet TAKE 1 TABLET TWICE DAILY   No facility-administered encounter medications on file as of 11/07/2022.    Review of Systems  Immunization History  Administered Date(s) Administered   Fluad Quad(high Dose 65+) 07/05/2022   Influenza Inj Mdck Quad Pf 09/21/2017, 09/16/2019   PFIZER Comirnaty(Gray Top)Covid-19 Tri-Sucrose Vaccine 03/16/2021   PFIZER(Purple Top)SARS-COV-2 Vaccination 11/01/2019, 11/22/2019   PPD Test 09/22/2022   Pertinent  Health Maintenance  Due  Topic Date Due   FOOT EXAM  05/10/2022   OPHTHALMOLOGY EXAM  05/10/2022   HEMOGLOBIN A1C  01/02/2023   INFLUENZA VACCINE  Completed   DEXA SCAN  Completed      07/05/2022    9:00 PM 07/06/2022    8:00 AM 08/30/2022    9:27 AM 09/05/2022    6:07 PM 09/06/2022    6:03 AM  Fall Risk  Falls in the past year?   1    Was there an injury with Fall?   0    Fall Risk Category Calculator   2    Fall Risk Category (Retired)   Moderate    (RETIRED) Patient Fall Risk Level High fall risk High fall risk  Low fall risk Low fall risk   Functional Status Survey:    There were no vitals filed for this visit. There is no height or weight on file to calculate BMI. Physical Exam Vitals reviewed.  Cardiovascular:     Rate and Rhythm: Normal rate.  Pulmonary:     Effort: Pulmonary effort is normal.  Abdominal:     Comments: Hyperactive bowel sounds  Neurological:     Mental Status: She is alert and oriented to person, place, and time.  Psychiatric:        Behavior: Behavior normal.     Labs reviewed: Recent Labs    07/03/22 1456 07/04/22 0557 07/05/22 0413 07/06/22 0451 09/05/22 1809  NA  --  139 138 139 139  K  --  5.2* 4.7 4.2 4.4  CL  --  107 101 107 109  CO2  --  23 27 27 23  $ GLUCOSE  --  138* 131* 79 123*  BUN  --  21 29* 27* 34*  CREATININE  --  1.48* 1.89* 1.80* 1.57*  CALCIUM  --  8.7* 8.9 8.5* 8.2*  MG  --  1.8 1.5* 2.6*  --   PHOS 3.3  --   --   --   --    Recent Labs    09/05/22 1809  AST 16  ALT 9  ALKPHOS 59  BILITOT 0.4  PROT 6.7  ALBUMIN 3.4*   Recent Labs    07/05/22 0413 07/06/22 0451 09/05/22 1809 09/22/22 0000 10/13/22 0000  WBC 11.0* 8.8 6.6 5.2 4.9  NEUTROABS  --   --  4.9 2,720.00 2,372.00  HGB 9.1* 8.6* 8.5* 7.9* 9.0*  HCT 29.8* 27.9* 28.6* 26* 29*  MCV 85.6 86.1 82.4  --   --   PLT 254 226 249 204 230   Lab Results  Component Value Date   TSH 22.10 (A) 10/13/2022   Lab Results  Component Value Date   HGBA1C 5.4 07/03/2022    Lab Results  Component Value Date   CHOL 117 07/03/2022  HDL 38 (L) 07/03/2022   LDLCALC 63 07/03/2022   TRIG 82 07/03/2022   CHOLHDL 3.1 07/03/2022    Significant Diagnostic Results in last 30 days:  No results found.  Assessment/Plan Diarrhea, unspecified type Less than 24 hours of symptoms. Tolerating some food and fluids. Zofran 77m q8 hours and peptobismol. Continue to monitor, if perisistent will consider collecting labs. At this time continue supportive care.    Family/ staff Communication: nursing  Labs/tests ordered:  pending resolution of symptoms

## 2022-11-08 ENCOUNTER — Encounter: Payer: Self-pay | Admitting: Student

## 2022-11-10 ENCOUNTER — Encounter: Payer: Self-pay | Admitting: Nurse Practitioner

## 2022-11-10 ENCOUNTER — Non-Acute Institutional Stay (SKILLED_NURSING_FACILITY): Payer: Medicare Other | Admitting: Nurse Practitioner

## 2022-11-10 DIAGNOSIS — I509 Heart failure, unspecified: Secondary | ICD-10-CM

## 2022-11-10 DIAGNOSIS — E039 Hypothyroidism, unspecified: Secondary | ICD-10-CM | POA: Diagnosis not present

## 2022-11-10 DIAGNOSIS — D649 Anemia, unspecified: Secondary | ICD-10-CM

## 2022-11-10 DIAGNOSIS — J449 Chronic obstructive pulmonary disease, unspecified: Secondary | ICD-10-CM | POA: Diagnosis not present

## 2022-11-10 DIAGNOSIS — I10 Essential (primary) hypertension: Secondary | ICD-10-CM

## 2022-11-10 DIAGNOSIS — R052 Subacute cough: Secondary | ICD-10-CM | POA: Diagnosis not present

## 2022-11-10 DIAGNOSIS — N1831 Chronic kidney disease, stage 3a: Secondary | ICD-10-CM | POA: Diagnosis not present

## 2022-11-10 NOTE — Progress Notes (Signed)
Location:   [facility] Room Number: [address] Place of Service:  SNF [phone]) Provider:  Sherrie Mustache, NP [name], MD  Patient Care Team: [name], MD as PCP - General (Internal Medicine) Christene Lye, MD as Consulting Physician (General Surgery) Alena Bills, [facility] as Pharmacist (Pharmacist)  Extended Emergency Contact Information Primary Emergency Contact: Thrush,[name] Address: [address]          [place], [place] [address] Johnnette Litter of [place] Phone: [phone] Mobile Phone: [phone] Relation: Son Secondary Emergency Contact: [name] Address: [address]          [place], [place] [address] Johnnette Litter of [place] Mobile Phone: [phone] Relation: Daughter  Code Status:  DNR Goals of care: Advanced Directive information    11/10/2022    9:30 AM  Advanced Directives  Does Patient Have a Medical Advance Directive? Yes  Type of Advance Directive [place]  Does patient want to make changes to medical advance directive? No - Patient declined  Copy of [place] in Chart? Yes - validated most recent copy scanned in chart (See row information)     Chief Complaint  Patient presents with   Medical Management of Chronic Issues    Routine follow up   Immunizations    Tdap vaccine, shingrix vaccine, pneumonia vaccine and a COVID booster due   Quality Metric Gaps    Foot exam and eye exam due    HPI:  Pt is a 89 y.o. female seen today for medical management of chronic diseases.   Staff also reports she has been coughing and it is making her get choked when it happens. Not al the time. No fevers or chills. Lungs have been clear per staff.  Pt reports cough has been ongoing for months.    Nursing continues to follow wounds - ABIs ordered due to nonhealing areas.   TSH is elevated- not currently on medication  Wheelchair bound due to advanced OA   Past  Medical History:  Diagnosis Date   Acute colitis on CT 03/07/2021   Arthritis 1940   Asthma    Bowel trouble    Cancer ([place]) 2006   DCIS left breast   Hypertension 1940   Incisional hernia 2014   Other primary ovarian failure 09/21/2017   Personal history of radiation therapy    Polymyalgia ([place])    Thyroid disorder    Vitiligo 2012   Past Surgical History:  Procedure Laterality Date   BREAST BIOPSY Left 2006   +   BREAST SURGERY Left 2006   lumpectomy   [place]   COLON SURGERY  2004   COLONOSCOPY  2014   Dr. Candace Cruise   EYE SURGERY  2002   FOOT SURGERY  1996   HERNIA REPAIR  2013   left breast biopsy   2011    Allergies  Allergen Reactions   Aspirin Itching and Other (See Comments)    Dizzy/passing out   Augmentin [Amoxicillin-Pot Clavulanate]    Codeine Other (See Comments)    dizziness   Doxycycline Hyclate Other (See Comments)   Sulfamethoxazole-Trimethoprim Diarrhea   Tape Itching   Vitamin D Analogs    Azithromycin Rash and Hives    Allergies as of 11/10/2022       Reactions   Aspirin Itching, Other (See Comments)   Dizzy/passing out   Augmentin [amoxicillin-pot Clavulanate]    Codeine Other (See Comments)   dizziness  Doxycycline Hyclate Other (See Comments)   Sulfamethoxazole-trimethoprim Diarrhea   Tape Itching   Vitamin D Analogs    Azithromycin Rash, Hives        Medication List        Accurate as of November 10, 2022  9:37 AM. If you have any questions, ask your nurse or doctor.          acetaminophen 500 MG tablet Commonly known as: TYLENOL Take 500 mg by mouth 3 (three) times daily.   amiodarone 100 MG tablet Commonly known as: PACERONE Take 100 mg by mouth daily.   apixaban 2.5 MG Tabs tablet Commonly known as: ELIQUIS Take 1 tablet (2.5 mg total) by mouth 2 (two) times daily.   Dermacloud Oint Apply to right hip wound topically one time daily   diclofenac Sodium 1 % Gel Commonly known as: VOLTAREN Apply 4  g topically 4 (four) times daily.   ferrous sulfate 325 (65 FE) MG EC tablet Take 325 mg by mouth every other day.   gabapentin 100 MG capsule Commonly known as: NEURONTIN Take 2 tabs 3 x day for neuropathy by mouth   hydroxychloroquine 200 MG tablet Commonly known as: PLAQUENIL Take 200 mg by mouth daily.   metoprolol tartrate 25 MG tablet Commonly known as: LOPRESSOR Take 0.5 tablets (12.5 mg total) by mouth 2 (two) times daily.   ondansetron 8 MG tablet Commonly known as: ZOFRAN Take 8 mg by mouth every 8 (eight) hours as needed for nausea or vomiting.   pantoprazole 40 MG tablet Commonly known as: PROTONIX TAKE 1 TABLET TWICE DAILY        Review of Systems  Constitutional:  Negative for activity change, appetite change, fatigue and unexpected weight change.  HENT:  Negative for congestion and hearing loss.   Eyes: Negative.   Respiratory:  Positive for cough. Negative for shortness of breath.   Cardiovascular:  Negative for chest pain, palpitations and leg swelling.  Gastrointestinal:  Negative for abdominal pain, constipation and diarrhea.  Genitourinary:  Negative for difficulty urinating and dysuria.  Musculoskeletal:  Positive for arthralgias, gait problem and myalgias.  Skin:  Positive for wound. Negative for color change.  Neurological:  Positive for weakness. Negative for dizziness.  Psychiatric/Behavioral:  Negative for agitation, behavioral problems and confusion.     Immunization History  Administered Date(s) Administered   Fluad Quad(high Dose 65+) 07/05/2022   Influenza Inj Mdck Quad Pf 09/21/2017, 09/16/2019   PFIZER Comirnaty(Gray Top)Covid-19 Tri-Sucrose Vaccine 03/16/2021   PFIZER(Purple Top)SARS-COV-2 Vaccination 11/01/2019, 11/22/2019   PPD Test 09/22/2022   Pertinent  Health Maintenance Due  Topic Date Due   FOOT EXAM  05/10/2022   OPHTHALMOLOGY EXAM  05/10/2022   HEMOGLOBIN A1C  01/02/2023   INFLUENZA VACCINE  Completed   DEXA SCAN   Completed      07/05/2022    9:00 PM 07/06/2022    8:00 AM 08/30/2022    9:27 AM 09/05/2022    6:07 PM 09/06/2022    6:03 AM  Fall Risk  Falls in the past year?   1    Was there an injury with Fall?   0    Fall Risk Category Calculator   2    Fall Risk Category (Retired)   Moderate    (RETIRED) Patient Fall Risk Level High fall risk High fall risk  Low fall risk Low fall risk   Functional Status Survey:    Vitals:   11/10/22 0927  BP: 104/69  Pulse: 80  Resp: 16  Temp: (!) 96.1 F (35.6 C)  SpO2: 100%  Weight: 165 lb 14.4 oz (75.3 kg)  Height: 5' 4"$  (1.626 m)   Body mass index is 28.48 kg/m. Physical Exam Constitutional:      General: She is not in acute distress.    Appearance: She is well-developed. She is not diaphoretic.  HENT:     Head: Normocephalic and atraumatic.     Mouth/Throat:     Pharynx: No oropharyngeal exudate.  Eyes:     Conjunctiva/sclera: Conjunctivae normal.     Pupils: Pupils are equal, round, and reactive to light.  Cardiovascular:     Rate and Rhythm: Normal rate and regular rhythm.     Heart sounds: Normal heart sounds.  Pulmonary:     Effort: Pulmonary effort is normal.     Breath sounds: Wheezing (to left lower lobe) present.  Abdominal:     General: Bowel sounds are normal.     Palpations: Abdomen is soft.  Musculoskeletal:     Cervical back: Normal range of motion and neck supple.     Right lower leg: No edema.     Left lower leg: No edema.  Skin:    General: Skin is warm and dry.  Neurological:     Mental Status: She is alert. Mental status is at baseline.  Psychiatric:        Mood and Affect: Mood normal.     Labs reviewed: Recent Labs    07/03/22 1456 07/04/22 0557 07/05/22 0413 07/06/22 0451 09/05/22 1809  NA  --  139 138 139 139  K  --  5.2* 4.7 4.2 4.4  CL  --  107 101 107 109  CO2  --  23 27 27 23  $ GLUCOSE  --  138* 131* 79 123*  BUN  --  21 29* 27* 34*  CREATININE  --  1.48* 1.89* 1.80* 1.57*  CALCIUM   --  8.7* 8.9 8.5* 8.2*  MG  --  1.8 1.5* 2.6*  --   PHOS 3.3  --   --   --   --    Recent Labs    09/05/22 1809  AST 16  ALT 9  ALKPHOS 59  BILITOT 0.4  PROT 6.7  ALBUMIN 3.4*   Recent Labs    07/05/22 0413 07/06/22 0451 09/05/22 1809 09/22/22 0000 10/13/22 0000  WBC 11.0* 8.8 6.6 5.2 4.9  NEUTROABS  --   --  4.9 2,720.00 2,372.00  HGB 9.1* 8.6* 8.5* 7.9* 9.0*  HCT 29.8* 27.9* 28.6* 26* 29*  MCV 85.6 86.1 82.4  --   --   PLT 254 226 249 204 230   Lab Results  Component Value Date   TSH 22.10 (A) 10/13/2022   Lab Results  Component Value Date   HGBA1C 5.4 07/03/2022   Lab Results  Component Value Date   CHOL 117 07/03/2022   HDL 38 (L) 07/03/2022   LDLCALC 63 07/03/2022   TRIG 82 07/03/2022   CHOLHDL 3.1 07/03/2022    Significant Diagnostic Results in last 30 days:  No results found.  Assessment/Plan 1. Primary hypertension -Blood pressure well controlled, goal bp <140/90 Continue current medications and dietary modifications follow metabolic panel  2. Congestive heart failure, unspecified HF chronicity, unspecified heart failure type ([place]) -weight has been stable, no LE edema but now with lower lobe wheezing and cough.  -will get chest xray at this time -continues on metoprolol  3. Chronic obstructive pulmonary disease, unspecified  COPD type ([place]) -not on any inhalers, will get chest xray due to wheezing  4. Stage 3a chronic kidney disease (CKD) (HCC) -Chronic and stable Encourage proper hydration Follow metabolic panel Avoid nephrotoxic meds (NSAIDS)  5. Hypothyroidism, unspecified type -TSH elevated, will start synthroid 50 mcg at this time and follow up TSH in 8 weeks.   6. Anemia, unspecified type Will follow up CBC, continues on iron supplement  7. Subacute cough -will start mucinex DM due to cough and congestion.  -will get chest xray, cbc and bmp at this time as well.   Carlos American. [place], [facility] [phone]

## 2022-11-14 LAB — CBC AND DIFFERENTIAL
HCT: 31 — AB (ref 36–46)
Hemoglobin: 10 — AB (ref 12.0–16.0)
Neutrophils Absolute: 2403
WBC: 5.5

## 2022-11-14 LAB — COMPREHENSIVE METABOLIC PANEL
Albumin: 2.2 — AB (ref 3.5–5.0)
Calcium: 7.4 — AB (ref 8.7–10.7)
Globulin: 2.7

## 2022-11-14 LAB — BASIC METABOLIC PANEL
BUN: 18 (ref 4–21)
CO2: 25 — AB (ref 13–22)
Chloride: 104 (ref 99–108)
Creatinine: 1.2 — AB (ref 0.5–1.1)
Glucose: 38
Potassium: 4.1 mEq/L (ref 3.5–5.1)
Sodium: 136 — AB (ref 137–147)

## 2022-11-14 LAB — CBC: RBC: 3.33 — AB (ref 3.87–5.11)

## 2022-11-14 LAB — HEPATIC FUNCTION PANEL
ALT: 4 U/L — AB (ref 7–35)
AST: 12 — AB (ref 13–35)
Alkaline Phosphatase: 80 (ref 25–125)

## 2022-11-15 ENCOUNTER — Non-Acute Institutional Stay (SKILLED_NURSING_FACILITY): Payer: Medicare Other | Admitting: Nurse Practitioner

## 2022-11-15 ENCOUNTER — Encounter: Payer: Self-pay | Admitting: Nurse Practitioner

## 2022-11-15 DIAGNOSIS — R110 Nausea: Secondary | ICD-10-CM | POA: Diagnosis not present

## 2022-11-15 DIAGNOSIS — E440 Moderate protein-calorie malnutrition: Secondary | ICD-10-CM

## 2022-11-15 DIAGNOSIS — L97529 Non-pressure chronic ulcer of other part of left foot with unspecified severity: Secondary | ICD-10-CM

## 2022-11-15 DIAGNOSIS — R059 Cough, unspecified: Secondary | ICD-10-CM | POA: Diagnosis not present

## 2022-11-15 DIAGNOSIS — H1033 Unspecified acute conjunctivitis, bilateral: Secondary | ICD-10-CM | POA: Diagnosis not present

## 2022-11-15 DIAGNOSIS — I4819 Other persistent atrial fibrillation: Secondary | ICD-10-CM | POA: Diagnosis not present

## 2022-11-15 NOTE — Progress Notes (Signed)
Location:  Other [facility] Room Number: [place] of Service:     [name], MD  Patient Care Team: [name], MD as PCP - General (Internal Medicine) Christene Lye, MD as Consulting Physician (General Surgery) Alena Bills, [facility] as Pharmacist (Pharmacist)  Extended Emergency Contact Information Primary Emergency Contact: [name],[name] Address: [address]          [place], [place] [address] Stacy [name] of [place] Phone: [phone] Mobile Phone: [phone] Relation: Son Secondary Emergency Contact: Stacy [name] Address: [address]          [place], [place] [address] Stacy [name] of [place] Mobile Phone: [phone] Relation: Daughter  Goals of care: Advanced Directive information    11/10/2022    9:30 AM  Advanced Directives  Does Patient Have a Medical Advance Directive? Yes  Type of Advance Directive [place]  Does patient want to make changes to medical advance directive? No - Patient declined  Copy of [place] in Chart? Yes - validated most recent copy scanned in chart (See row information)     Chief Complaint  Patient presents with   Acute Visit    Red eyes that are draining, ongoing cough with wheezing    HPI:  Pt is a 89 y.o. female seen today for an acute visit for red eyes with drainage.   Nursing following for wound care and most of her wounds are healing. Eschar has been removed from heel. Left toe still with breakdown and left 5th toe red.  Pt denies increase in pain.   Recent labs reveal significant protein calorie malnutrition- pt reports her stomach hurts when she eats and increase in nausea. She is having regular bowel movements and no abdominal pain.   Continues to have cough and wheezing per nursing despite being treated with lasix. Pt denies shortness of breath or congestion. She has hx of COPD but has not been on any inhalers or nebulizer    Past  Medical History:  Diagnosis Date   Acute colitis on CT 03/07/2021   Arthritis 1940   Asthma    Bowel trouble    Cancer ([place]) 2006   DCIS left breast   Hypertension 1940   Incisional hernia 2014   Other primary ovarian failure 09/21/2017   Personal history of radiation therapy    Polymyalgia ([place])    Thyroid disorder    Vitiligo 2012   Past Surgical History:  Procedure Laterality Date   BREAST BIOPSY Left 2006   +   BREAST SURGERY Left 2006   lumpectomy   [place]   COLON SURGERY  2004   COLONOSCOPY  2014   Dr. Candace Cruise   EYE SURGERY  2002   FOOT SURGERY  1996   HERNIA REPAIR  2013   left breast biopsy   2011    Allergies  Allergen Reactions   Aspirin Itching and Other (See Comments)    Dizzy/passing out   Augmentin [Amoxicillin-Pot Clavulanate]    Codeine Other (See Comments)    dizziness   Doxycycline Hyclate Other (See Comments)   Sulfamethoxazole-Trimethoprim Diarrhea   Tape Itching   Vitamin D Analogs    Azithromycin Rash and Hives    Outpatient Encounter Medications as of 11/15/2022  Medication Sig   acetaminophen (TYLENOL) 500 MG tablet Take 500 mg by mouth 3 (three) times daily.   amiodarone (PACERONE) 100 MG tablet Take 100 mg by mouth daily.  apixaban (ELIQUIS) 2.5 MG TABS tablet Take 1 tablet (2.5 mg total) by mouth 2 (two) times daily.   calcium carbonate (OSCAL) 1500 (600 Ca) MG TABS tablet Take 600 mg of elemental calcium by mouth 2 (two) times daily with a meal.   Dextromethorphan-guaiFENesin 20-400 MG TABS Take 1 tablet by mouth 2 (two) times daily.   diclofenac Sodium (VOLTAREN) 1 % GEL Apply 4 g topically 4 (four) times daily.   ferrous sulfate 325 (65 FE) MG EC tablet Take 325 mg by mouth every other day.   gabapentin (NEURONTIN) 100 MG capsule Take 2 tabs 3 x day for neuropathy by mouth   hydroxychloroquine (PLAQUENIL) 200 MG tablet Take 200 mg by mouth daily.   Infant Care [employer] [facility]) OINT Apply to buttock wound topically  every day and evening shift for skin protection   lactose free nutrition (BOOST) LIQD Take 237 mLs by mouth 3 (three) times daily between meals.   levothyroxine (SYNTHROID) 50 MCG tablet Take 50 mcg by mouth daily before breakfast.   metoprolol tartrate (LOPRESSOR) 25 MG tablet Take 0.5 tablets (12.5 mg total) by mouth 2 (two) times daily.   Multiple Vitamins-Minerals (DECUBI-VITE PO) Take 1 tablet by mouth daily. For wound healing   ondansetron (ZOFRAN) 8 MG tablet Take 8 mg by mouth every 8 (eight) hours as needed for nausea or vomiting.   pantoprazole (PROTONIX) 40 MG tablet TAKE 1 TABLET TWICE DAILY   No facility-administered encounter medications on file as of 11/15/2022.    Review of Systems  Constitutional:  Positive for fatigue. Negative for activity change, appetite change and unexpected weight change.  HENT:  Negative for congestion and hearing loss.   Eyes:  Positive for discharge and redness. Negative for pain and itching.  Respiratory:  Positive for cough. Negative for shortness of breath.   Cardiovascular:  Negative for chest pain, palpitations and leg swelling.  Gastrointestinal:  Positive for nausea. Negative for abdominal pain, constipation and diarrhea.  Genitourinary:  Negative for difficulty urinating and dysuria.  Musculoskeletal:  Negative for arthralgias and myalgias.  Skin:  Positive for wound. Negative for color change.  Neurological:  Negative for dizziness and weakness.  Psychiatric/Behavioral:  Negative for agitation, behavioral problems and confusion.     Immunization History  Administered Date(s) Administered   Fluad Quad(high Dose 65+) 07/05/2022   Influenza Inj Mdck Quad Pf 09/21/2017, 09/16/2019   PFIZER Comirnaty(Gray Top)Covid-19 Tri-Sucrose Vaccine 03/16/2021   PFIZER(Purple Top)SARS-COV-2 Vaccination 11/01/2019, 11/22/2019   PPD Test 09/22/2022   Pertinent  Health Maintenance Due  Topic Date Due   FOOT EXAM  05/10/2022   OPHTHALMOLOGY EXAM   05/10/2022   HEMOGLOBIN A1C  01/02/2023   INFLUENZA VACCINE  Completed   DEXA SCAN  Completed      07/05/2022    9:00 PM 07/06/2022    8:00 AM 08/30/2022    9:27 AM 09/05/2022    6:07 PM 09/06/2022    6:03 AM  Fall Risk  Falls in the past year?   1    Was there an injury with Fall?   0    Fall Risk Category Calculator   2    Fall Risk Category (Retired)   Moderate    (RETIRED) Patient Fall Risk Level High fall risk High fall risk  Low fall risk Low fall risk   Functional Status Survey:    Vitals:   11/15/22 1532  BP: 101/63  Pulse: 68  Resp: 18  Temp: 98 F (36.7 C)  SpO2: 96%   There is no height or weight on file to calculate BMI. Physical Exam Constitutional:      General: She is not in acute distress.    Appearance: She is well-developed. She is not diaphoretic.  HENT:     Head: Normocephalic and atraumatic.     Mouth/Throat:     Pharynx: No oropharyngeal exudate.  Eyes:     Extraocular Movements: Extraocular movements intact.     Conjunctiva/sclera:     Right eye: Right conjunctiva is injected. Exudate present.     Left eye: Left conjunctiva is injected. Exudate present.     Pupils: Pupils are equal, round, and reactive to light.  Cardiovascular:     Rate and Rhythm: Normal rate and regular rhythm.     Heart sounds: Normal heart sounds.  Pulmonary:     Effort: Pulmonary effort is normal.     Breath sounds: Normal breath sounds.  Abdominal:     General: Bowel sounds are normal.     Palpations: Abdomen is soft.  Musculoskeletal:     Cervical back: Normal range of motion and neck supple.     Right lower leg: No edema.     Left lower leg: No edema.  Skin:    General: Skin is warm and dry.  Neurological:     Mental Status: She is alert.  Psychiatric:        Mood and Affect: Mood normal.     Labs reviewed: Recent Labs    07/03/22 1456 07/04/22 0557 07/05/22 0413 07/06/22 0451 09/05/22 1809 11/14/22 0000  NA  --  139 138 139 139 136*  K  --   5.2* 4.7 4.2 4.4 4.1  CL  --  107 101 107 109 104  CO2  --  23 27 27 23 $ 25*  GLUCOSE  --  138* 131* 79 123*  --   BUN  --  21 29* 27* 34* 18  CREATININE  --  1.48* 1.89* 1.80* 1.57* 1.2*  CALCIUM  --  8.7* 8.9 8.5* 8.2* 7.4*  MG  --  1.8 1.5* 2.6*  --   --   PHOS 3.3  --   --   --   --   --    Recent Labs    09/05/22 1809 11/14/22 0000  AST 16 12*  ALT 9 4*  ALKPHOS 59 80  BILITOT 0.4  --   PROT 6.7  --   ALBUMIN 3.4* 2.2*   Recent Labs    07/05/22 0413 07/06/22 0451 09/05/22 1809 09/05/22 1809 09/22/22 0000 10/13/22 0000 10/24/22 0000 11/14/22 0000  WBC 11.0* 8.8 6.6   < > 5.2 4.9 5.9 5.5  NEUTROABS  --   --  4.9   < > 2,720.00 2,372.00 2,207.00 2,403.00  HGB 9.1* 8.6* 8.5*  --  7.9* 9.0* 9.9* 10.0*  HCT 29.8* 27.9* 28.6*  --  26* 29* 31* 31*  MCV 85.6 86.1 82.4  --   --   --   --   --   PLT 254 226 249  --  204 230 208  --    < > = values in this interval not displayed.   Lab Results  Component Value Date   TSH 22.10 (A) 10/13/2022   Lab Results  Component Value Date   HGBA1C 5.4 07/03/2022   Lab Results  Component Value Date   CHOL 117 07/03/2022   HDL 38 (L) 07/03/2022   LDLCALC 63 07/03/2022  TRIG 82 07/03/2022   CHOLHDL 3.1 07/03/2022    Significant Diagnostic Results in last 30 days:  No results found.  Assessment/Plan 1. Acute bacterial conjunctivitis of both eyes - cipro opthalmic solution 0.3% every 4 hours while awake for 5 days   2. Hypocalcemia -noted on labs, will add calcium 600 mg by mouth twice daily  3. Moderate protein-calorie malnutrition ([place]) -will get RD consult. She reports she has stomach upset/nausea when she eats.  Having regular BM. No abdominal pain  4. Ulcer of toe of left foot, unspecified ulcer stage ([place]) -followed by nursing   5. Cough, unspecified type -ongoing, breath sounds with wheezing to bilateral lower lobes and rhonchi- will follow up chest xray due to previous effusions- she has completed 5 days of  lasix. She has a hx of CHF and COPD but has not needed medications/inhalers. -will get BNP  6. Nausea Ongoing, Continues on protonix 40 mg BID -add carafate 10 ml ACHS.  - may need GI referral if persist.    Stacy [name] K. [place], [facility] [phone]

## 2022-11-16 DIAGNOSIS — E118 Type 2 diabetes mellitus with unspecified complications: Secondary | ICD-10-CM | POA: Diagnosis not present

## 2022-11-16 DIAGNOSIS — I350 Nonrheumatic aortic (valve) stenosis: Secondary | ICD-10-CM | POA: Diagnosis not present

## 2022-11-16 DIAGNOSIS — I4819 Other persistent atrial fibrillation: Secondary | ICD-10-CM | POA: Diagnosis not present

## 2022-11-16 DIAGNOSIS — I5022 Chronic systolic (congestive) heart failure: Secondary | ICD-10-CM | POA: Diagnosis not present

## 2022-11-16 DIAGNOSIS — I10 Essential (primary) hypertension: Secondary | ICD-10-CM | POA: Diagnosis not present

## 2022-11-17 LAB — BASIC METABOLIC PANEL
BUN: 23 — AB (ref 4–21)
CO2: 24 — AB (ref 13–22)
Chloride: 105 (ref 99–108)
Creatinine: 1.2 — AB (ref 0.5–1.1)
Glucose: 54
Potassium: 3.9 mEq/L (ref 3.5–5.1)
Sodium: 137 (ref 137–147)

## 2022-11-17 LAB — COMPREHENSIVE METABOLIC PANEL
Calcium: 7.2 — AB (ref 8.7–10.7)
eGFR: 41

## 2022-11-22 ENCOUNTER — Inpatient Hospital Stay
Admission: EM | Admit: 2022-11-22 | Discharge: 2022-12-02 | DRG: 580 | Disposition: A | Discharge status: Skilled Nursing Facility | Payer: Medicare Other | Attending: Internal Medicine | Admitting: Internal Medicine

## 2022-11-22 ENCOUNTER — Other Ambulatory Visit: Payer: Self-pay

## 2022-11-22 ENCOUNTER — Emergency Department: Payer: Medicare Other

## 2022-11-22 ENCOUNTER — Encounter: Payer: Medicare Other | Admitting: Physician Assistant

## 2022-11-22 ENCOUNTER — Inpatient Hospital Stay: Payer: Medicare Other

## 2022-11-22 ENCOUNTER — Encounter: Payer: Self-pay | Admitting: Internal Medicine

## 2022-11-22 DIAGNOSIS — R2689 Other abnormalities of gait and mobility: Secondary | ICD-10-CM | POA: Diagnosis not present

## 2022-11-22 DIAGNOSIS — L089 Local infection of the skin and subcutaneous tissue, unspecified: Secondary | ICD-10-CM | POA: Diagnosis not present

## 2022-11-22 DIAGNOSIS — L309 Dermatitis, unspecified: Secondary | ICD-10-CM | POA: Diagnosis not present

## 2022-11-22 DIAGNOSIS — L97522 Non-pressure chronic ulcer of other part of left foot with fat layer exposed: Secondary | ICD-10-CM | POA: Insufficient documentation

## 2022-11-22 DIAGNOSIS — I5042 Chronic combined systolic (congestive) and diastolic (congestive) heart failure: Secondary | ICD-10-CM | POA: Diagnosis present

## 2022-11-22 DIAGNOSIS — S81809A Unspecified open wound, unspecified lower leg, initial encounter: Secondary | ICD-10-CM | POA: Diagnosis not present

## 2022-11-22 DIAGNOSIS — I714 Abdominal aortic aneurysm, without rupture, unspecified: Secondary | ICD-10-CM | POA: Diagnosis present

## 2022-11-22 DIAGNOSIS — E876 Hypokalemia: Secondary | ICD-10-CM | POA: Diagnosis present

## 2022-11-22 DIAGNOSIS — S91332A Puncture wound without foreign body, left foot, initial encounter: Principal | ICD-10-CM

## 2022-11-22 DIAGNOSIS — Z79899 Other long term (current) drug therapy: Secondary | ICD-10-CM

## 2022-11-22 DIAGNOSIS — J449 Chronic obstructive pulmonary disease, unspecified: Secondary | ICD-10-CM | POA: Insufficient documentation

## 2022-11-22 DIAGNOSIS — Z66 Do not resuscitate: Secondary | ICD-10-CM | POA: Diagnosis present

## 2022-11-22 DIAGNOSIS — B950 Streptococcus, group A, as the cause of diseases classified elsewhere: Secondary | ICD-10-CM | POA: Diagnosis present

## 2022-11-22 DIAGNOSIS — I499 Cardiac arrhythmia, unspecified: Secondary | ICD-10-CM | POA: Diagnosis not present

## 2022-11-22 DIAGNOSIS — I5023 Acute on chronic systolic (congestive) heart failure: Secondary | ICD-10-CM | POA: Diagnosis not present

## 2022-11-22 DIAGNOSIS — Z743 Need for continuous supervision: Secondary | ICD-10-CM | POA: Diagnosis not present

## 2022-11-22 DIAGNOSIS — Z79890 Hormone replacement therapy (postmenopausal): Secondary | ICD-10-CM

## 2022-11-22 DIAGNOSIS — R54 Age-related physical debility: Secondary | ICD-10-CM | POA: Diagnosis present

## 2022-11-22 DIAGNOSIS — L97528 Non-pressure chronic ulcer of other part of left foot with other specified severity: Secondary | ICD-10-CM | POA: Insufficient documentation

## 2022-11-22 DIAGNOSIS — M7989 Other specified soft tissue disorders: Secondary | ICD-10-CM | POA: Diagnosis not present

## 2022-11-22 DIAGNOSIS — I130 Hypertensive heart and chronic kidney disease with heart failure and stage 1 through stage 4 chronic kidney disease, or unspecified chronic kidney disease: Secondary | ICD-10-CM | POA: Insufficient documentation

## 2022-11-22 DIAGNOSIS — I082 Rheumatic disorders of both aortic and tricuspid valves: Secondary | ICD-10-CM | POA: Diagnosis present

## 2022-11-22 DIAGNOSIS — E162 Hypoglycemia, unspecified: Secondary | ICD-10-CM | POA: Diagnosis present

## 2022-11-22 DIAGNOSIS — Z853 Personal history of malignant neoplasm of breast: Secondary | ICD-10-CM | POA: Diagnosis not present

## 2022-11-22 DIAGNOSIS — B955 Unspecified streptococcus as the cause of diseases classified elsewhere: Secondary | ICD-10-CM

## 2022-11-22 DIAGNOSIS — I959 Hypotension, unspecified: Secondary | ICD-10-CM | POA: Diagnosis not present

## 2022-11-22 DIAGNOSIS — M797 Fibromyalgia: Secondary | ICD-10-CM | POA: Diagnosis present

## 2022-11-22 DIAGNOSIS — I4891 Unspecified atrial fibrillation: Secondary | ICD-10-CM | POA: Diagnosis not present

## 2022-11-22 DIAGNOSIS — L97428 Non-pressure chronic ulcer of left heel and midfoot with other specified severity: Secondary | ICD-10-CM | POA: Diagnosis not present

## 2022-11-22 DIAGNOSIS — Z886 Allergy status to analgesic agent status: Secondary | ICD-10-CM

## 2022-11-22 DIAGNOSIS — I10 Essential (primary) hypertension: Secondary | ICD-10-CM | POA: Diagnosis not present

## 2022-11-22 DIAGNOSIS — D638 Anemia in other chronic diseases classified elsewhere: Secondary | ICD-10-CM | POA: Diagnosis present

## 2022-11-22 DIAGNOSIS — I5020 Unspecified systolic (congestive) heart failure: Secondary | ICD-10-CM | POA: Diagnosis not present

## 2022-11-22 DIAGNOSIS — M064 Inflammatory polyarthropathy: Secondary | ICD-10-CM | POA: Diagnosis present

## 2022-11-22 DIAGNOSIS — Z9049 Acquired absence of other specified parts of digestive tract: Secondary | ICD-10-CM

## 2022-11-22 DIAGNOSIS — I7389 Other specified peripheral vascular diseases: Secondary | ICD-10-CM | POA: Diagnosis present

## 2022-11-22 DIAGNOSIS — A491 Streptococcal infection, unspecified site: Secondary | ICD-10-CM | POA: Diagnosis not present

## 2022-11-22 DIAGNOSIS — J9611 Chronic respiratory failure with hypoxia: Secondary | ICD-10-CM | POA: Diagnosis not present

## 2022-11-22 DIAGNOSIS — L97529 Non-pressure chronic ulcer of other part of left foot with unspecified severity: Secondary | ICD-10-CM | POA: Diagnosis not present

## 2022-11-22 DIAGNOSIS — S41109A Unspecified open wound of unspecified upper arm, initial encounter: Secondary | ICD-10-CM | POA: Diagnosis not present

## 2022-11-22 DIAGNOSIS — R0902 Hypoxemia: Secondary | ICD-10-CM | POA: Diagnosis present

## 2022-11-22 DIAGNOSIS — Z515 Encounter for palliative care: Secondary | ICD-10-CM

## 2022-11-22 DIAGNOSIS — R001 Bradycardia, unspecified: Secondary | ICD-10-CM | POA: Diagnosis not present

## 2022-11-22 DIAGNOSIS — N1831 Chronic kidney disease, stage 3a: Secondary | ICD-10-CM | POA: Insufficient documentation

## 2022-11-22 DIAGNOSIS — Z8261 Family history of arthritis: Secondary | ICD-10-CM

## 2022-11-22 DIAGNOSIS — M6281 Muscle weakness (generalized): Secondary | ICD-10-CM | POA: Diagnosis not present

## 2022-11-22 DIAGNOSIS — R278 Other lack of coordination: Secondary | ICD-10-CM | POA: Diagnosis not present

## 2022-11-22 DIAGNOSIS — E871 Hypo-osmolality and hyponatremia: Secondary | ICD-10-CM | POA: Diagnosis present

## 2022-11-22 DIAGNOSIS — L03116 Cellulitis of left lower limb: Secondary | ICD-10-CM | POA: Diagnosis present

## 2022-11-22 DIAGNOSIS — B741 Filariasis due to Brugia malayi: Secondary | ICD-10-CM | POA: Diagnosis not present

## 2022-11-22 DIAGNOSIS — E039 Hypothyroidism, unspecified: Secondary | ICD-10-CM | POA: Diagnosis not present

## 2022-11-22 DIAGNOSIS — Z91048 Other nonmedicinal substance allergy status: Secondary | ICD-10-CM

## 2022-11-22 DIAGNOSIS — I739 Peripheral vascular disease, unspecified: Secondary | ICD-10-CM | POA: Diagnosis not present

## 2022-11-22 DIAGNOSIS — K219 Gastro-esophageal reflux disease without esophagitis: Secondary | ICD-10-CM | POA: Diagnosis not present

## 2022-11-22 DIAGNOSIS — I70201 Unspecified atherosclerosis of native arteries of extremities, right leg: Secondary | ICD-10-CM | POA: Diagnosis not present

## 2022-11-22 DIAGNOSIS — Z7901 Long term (current) use of anticoagulants: Secondary | ICD-10-CM | POA: Insufficient documentation

## 2022-11-22 DIAGNOSIS — Z923 Personal history of irradiation: Secondary | ICD-10-CM

## 2022-11-22 DIAGNOSIS — K5900 Constipation, unspecified: Secondary | ICD-10-CM | POA: Diagnosis not present

## 2022-11-22 DIAGNOSIS — R2681 Unsteadiness on feet: Secondary | ICD-10-CM | POA: Diagnosis present

## 2022-11-22 DIAGNOSIS — H109 Unspecified conjunctivitis: Secondary | ICD-10-CM | POA: Diagnosis present

## 2022-11-22 DIAGNOSIS — Z825 Family history of asthma and other chronic lower respiratory diseases: Secondary | ICD-10-CM

## 2022-11-22 DIAGNOSIS — M353 Polymyalgia rheumatica: Secondary | ICD-10-CM | POA: Diagnosis present

## 2022-11-22 DIAGNOSIS — R0602 Shortness of breath: Secondary | ICD-10-CM | POA: Diagnosis not present

## 2022-11-22 DIAGNOSIS — I70245 Atherosclerosis of native arteries of left leg with ulceration of other part of foot: Secondary | ICD-10-CM | POA: Diagnosis not present

## 2022-11-22 DIAGNOSIS — Z881 Allergy status to other antibiotic agents status: Secondary | ICD-10-CM

## 2022-11-22 DIAGNOSIS — Z880 Allergy status to penicillin: Secondary | ICD-10-CM

## 2022-11-22 DIAGNOSIS — I480 Paroxysmal atrial fibrillation: Secondary | ICD-10-CM | POA: Diagnosis present

## 2022-11-22 DIAGNOSIS — L97429 Non-pressure chronic ulcer of left heel and midfoot with unspecified severity: Secondary | ICD-10-CM | POA: Diagnosis present

## 2022-11-22 DIAGNOSIS — J90 Pleural effusion, not elsewhere classified: Secondary | ICD-10-CM | POA: Diagnosis not present

## 2022-11-22 DIAGNOSIS — J4489 Other specified chronic obstructive pulmonary disease: Secondary | ICD-10-CM | POA: Diagnosis present

## 2022-11-22 DIAGNOSIS — M1990 Unspecified osteoarthritis, unspecified site: Secondary | ICD-10-CM | POA: Diagnosis present

## 2022-11-22 DIAGNOSIS — N179 Acute kidney failure, unspecified: Secondary | ICD-10-CM | POA: Diagnosis not present

## 2022-11-22 DIAGNOSIS — Z8249 Family history of ischemic heart disease and other diseases of the circulatory system: Secondary | ICD-10-CM

## 2022-11-22 DIAGNOSIS — R7881 Bacteremia: Secondary | ICD-10-CM | POA: Diagnosis present

## 2022-11-22 DIAGNOSIS — I4811 Longstanding persistent atrial fibrillation: Secondary | ICD-10-CM | POA: Diagnosis not present

## 2022-11-22 DIAGNOSIS — R4189 Other symptoms and signs involving cognitive functions and awareness: Secondary | ICD-10-CM | POA: Diagnosis not present

## 2022-11-22 DIAGNOSIS — T148XXA Other injury of unspecified body region, initial encounter: Secondary | ICD-10-CM | POA: Diagnosis not present

## 2022-11-22 LAB — CBC
HCT: 37 % (ref 36.0–46.0)
Hemoglobin: 11.3 g/dL — ABNORMAL LOW (ref 12.0–15.0)
MCH: 29 pg (ref 26.0–34.0)
MCHC: 30.5 g/dL (ref 30.0–36.0)
MCV: 95.1 fL (ref 80.0–100.0)
Platelets: 338 10*3/uL (ref 150–400)
RBC: 3.89 MIL/uL (ref 3.87–5.11)
RDW: 22.8 % — ABNORMAL HIGH (ref 11.5–15.5)
WBC: 7.3 10*3/uL (ref 4.0–10.5)
nRBC: 0 % (ref 0.0–0.2)

## 2022-11-22 LAB — COMPREHENSIVE METABOLIC PANEL
ALT: 7 U/L (ref 0–44)
AST: 19 U/L (ref 15–41)
Albumin: 2.3 g/dL — ABNORMAL LOW (ref 3.5–5.0)
Alkaline Phosphatase: 97 U/L (ref 38–126)
Anion gap: 11 (ref 5–15)
BUN: 34 mg/dL — ABNORMAL HIGH (ref 8–23)
CO2: 22 mmol/L (ref 22–32)
Calcium: 8.5 mg/dL — ABNORMAL LOW (ref 8.9–10.3)
Chloride: 104 mmol/L (ref 98–111)
Creatinine, Ser: 1.24 mg/dL — ABNORMAL HIGH (ref 0.44–1.00)
GFR, Estimated: 41 mL/min — ABNORMAL LOW (ref >60)
Glucose, Bld: 74 mg/dL (ref 70–99)
Potassium: 3.8 mmol/L (ref 3.5–5.1)
Sodium: 137 mmol/L (ref 135–145)
Total Bilirubin: 0.5 mg/dL (ref 0.3–1.2)
Total Protein: 6.4 g/dL — ABNORMAL LOW (ref 6.5–8.1)

## 2022-11-22 LAB — PROTIME-INR
INR: 2.3 — ABNORMAL HIGH (ref 0.8–1.2)
Prothrombin Time: 24.8 seconds — ABNORMAL HIGH (ref 11.4–15.2)

## 2022-11-22 LAB — SEDIMENTATION RATE: Sed Rate: 37 mm/hr — ABNORMAL HIGH (ref 0–30)

## 2022-11-22 LAB — APTT: aPTT: 53 seconds — ABNORMAL HIGH (ref 24–36)

## 2022-11-22 LAB — LACTIC ACID, PLASMA: Lactic Acid, Venous: 1.5 mmol/L (ref 0.5–1.9)

## 2022-11-22 MED ORDER — ONDANSETRON HCL 4 MG PO TABS
8.0000 mg | ORAL_TABLET | Freq: Three times a day (TID) | ORAL | Status: DC | PRN
  Administered 2022-11-22 – 2022-11-28 (×4): 8 mg via ORAL
  Filled 2022-11-22 (×4): qty 2

## 2022-11-22 MED ORDER — SODIUM CHLORIDE 0.9 % IV BOLUS
500.0000 mL | Freq: Once | INTRAVENOUS | Status: AC
  Administered 2022-11-22: 500 mL via INTRAVENOUS

## 2022-11-22 MED ORDER — AMIODARONE HCL 200 MG PO TABS
100.0000 mg | ORAL_TABLET | Freq: Every day | ORAL | Status: DC
  Administered 2022-11-24 – 2022-12-02 (×9): 100 mg via ORAL
  Filled 2022-11-22 (×10): qty 1

## 2022-11-22 MED ORDER — METOPROLOL TARTRATE 5 MG/5ML IV SOLN: 2.5000 mg | Freq: Once | INTRAVENOUS | Status: DC

## 2022-11-22 MED ORDER — SODIUM CHLORIDE 0.9 % IV BOLUS
1000.0000 mL | Freq: Once | INTRAVENOUS | Status: AC
  Administered 2022-11-22: 1000 mL via INTRAVENOUS

## 2022-11-22 MED ORDER — ONDANSETRON 4 MG PO TBDP
ORAL_TABLET | ORAL | Status: AC
  Filled 2022-11-22: qty 2

## 2022-11-22 MED ORDER — ACETAMINOPHEN 325 MG PO TABS
650.0000 mg | ORAL_TABLET | Freq: Four times a day (QID) | ORAL | Status: DC | PRN
  Administered 2022-11-24: 650 mg via ORAL
  Filled 2022-11-22: qty 2

## 2022-11-22 MED ORDER — CALCIUM CARBONATE 1250 (500 CA) MG PO TABS
600.0000 mg | ORAL_TABLET | Freq: Two times a day (BID) | ORAL | Status: DC
  Administered 2022-11-23 – 2022-12-02 (×19): 1250 mg via ORAL
  Filled 2022-11-22 (×20): qty 1

## 2022-11-22 MED ORDER — MORPHINE SULFATE (PF) 4 MG/ML IV SOLN
4.0000 mg | Freq: Once | INTRAVENOUS | Status: AC
  Administered 2022-11-22: 4 mg via INTRAVENOUS
  Filled 2022-11-22: qty 1

## 2022-11-22 MED ORDER — VANCOMYCIN HCL 1250 MG/250ML IV SOLN: 1250.0000 mg | INTRAVENOUS | Status: DC

## 2022-11-22 MED ORDER — PANTOPRAZOLE SODIUM 40 MG PO TBEC
40.0000 mg | DELAYED_RELEASE_TABLET | Freq: Two times a day (BID) | ORAL | Status: DC
  Administered 2022-11-22 – 2022-12-02 (×19): 40 mg via ORAL
  Filled 2022-11-22 (×20): qty 1

## 2022-11-22 MED ORDER — LEVOTHYROXINE SODIUM 50 MCG PO TABS
50.0000 ug | ORAL_TABLET | Freq: Every day | ORAL | Status: DC
  Administered 2022-11-23 – 2022-12-02 (×9): 50 ug via ORAL
  Filled 2022-11-22 (×9): qty 1

## 2022-11-22 MED ORDER — ACETAMINOPHEN 650 MG RE SUPP: 650.0000 mg | Freq: Four times a day (QID) | RECTAL | Status: DC | PRN

## 2022-11-22 MED ORDER — GABAPENTIN 300 MG PO CAPS
300.0000 mg | ORAL_CAPSULE | Freq: Three times a day (TID) | ORAL | Status: DC
  Administered 2022-11-22 – 2022-11-26 (×12): 300 mg via ORAL
  Filled 2022-11-22 (×13): qty 1

## 2022-11-22 MED ORDER — ALBUTEROL SULFATE (2.5 MG/3ML) 0.083% IN NEBU: 2.5000 mg | INHALATION_SOLUTION | RESPIRATORY_TRACT | Status: DC | PRN

## 2022-11-22 MED ORDER — HYDROXYCHLOROQUINE SULFATE 200 MG PO TABS
200.0000 mg | ORAL_TABLET | Freq: Every day | ORAL | Status: DC
  Filled 2022-11-22: qty 1

## 2022-11-22 MED ORDER — ACETAMINOPHEN 500 MG PO TABS
500.0000 mg | ORAL_TABLET | Freq: Three times a day (TID) | ORAL | Status: DC
  Administered 2022-11-22 – 2022-12-02 (×27): 500 mg via ORAL
  Filled 2022-11-22 (×29): qty 1

## 2022-11-22 MED ORDER — HEPARIN (PORCINE) 25000 UT/250ML-% IV SOLN
900.0000 [IU]/h | INTRAVENOUS | Status: DC
  Administered 2022-11-22: 900 [IU]/h via INTRAVENOUS
  Filled 2022-11-22: qty 250

## 2022-11-22 MED ORDER — SODIUM CHLORIDE 0.9 % IV SOLN
12.5000 mg | Freq: Once | INTRAVENOUS | Status: DC
  Filled 2022-11-22: qty 0.5

## 2022-11-22 MED ORDER — METOPROLOL TARTRATE 25 MG PO TABS
12.5000 mg | ORAL_TABLET | Freq: Two times a day (BID) | ORAL | Status: DC
  Administered 2022-11-22: 12.5 mg via ORAL
  Filled 2022-11-22 (×2): qty 1

## 2022-11-22 MED ORDER — SODIUM CHLORIDE 0.9 % IV SOLN
2.0000 g | Freq: Once | INTRAVENOUS | Status: AC
  Administered 2022-11-22: 2 g via INTRAVENOUS
  Filled 2022-11-22: qty 12.5

## 2022-11-22 MED ORDER — FERROUS SULFATE 325 (65 FE) MG PO TABS
325.0000 mg | ORAL_TABLET | ORAL | Status: DC
  Administered 2022-11-25 – 2022-12-01 (×4): 325 mg via ORAL
  Filled 2022-11-22 (×5): qty 1

## 2022-11-22 MED ORDER — VANCOMYCIN HCL 1500 MG/300ML IV SOLN
1500.0000 mg | Freq: Once | INTRAVENOUS | Status: AC
  Administered 2022-11-22: 1500 mg via INTRAVENOUS
  Filled 2022-11-22 (×2): qty 300

## 2022-11-22 MED ORDER — HYDROCODONE-ACETAMINOPHEN 5-325 MG PO TABS
1.0000 | ORAL_TABLET | ORAL | Status: DC | PRN
  Administered 2022-11-23 – 2022-11-28 (×3): 1 via ORAL
  Filled 2022-11-22 (×4): qty 1

## 2022-11-22 MED ORDER — MORPHINE SULFATE (PF) 2 MG/ML IV SOLN
2.0000 mg | Freq: Once | INTRAVENOUS | Status: DC
  Filled 2022-11-22: qty 1

## 2022-11-22 MED ORDER — OFLOXACIN 0.3 % OP SOLN
1.0000 [drp] | Freq: Four times a day (QID) | OPHTHALMIC | Status: AC
  Administered 2022-11-22 – 2022-11-24 (×8): 1 [drp] via OPHTHALMIC
  Filled 2022-11-22: qty 5

## 2022-11-22 MED ORDER — METRONIDAZOLE 500 MG/100ML IV SOLN
500.0000 mg | Freq: Two times a day (BID) | INTRAVENOUS | Status: DC
  Administered 2022-11-23: 500 mg via INTRAVENOUS
  Filled 2022-11-22 (×2): qty 100

## 2022-11-22 NOTE — H&P (Addendum)
History and Physical    Patient: Stacy [name] [id number] DOB: Nov 03, 1926 DOA: 11/22/2022 DOS: the patient was seen and examined on 11/22/2022 PCP: Dewayne Shorter, MD  Patient coming from: SNF  Chief Complaint:  Chief Complaint  Patient presents with   Wound Check   HPI: Stacy [name] is a 89 y.o. female with medical history significant of hypertension, fibromyalgia, atrial fibrillation on anticoagulation with Eliquis,AAA history of breast cancer.  She presents to the emergency room with new left lower extremity ulcers on first and fifth digit.  Onset of symptoms few days prior.  She has previously been seen and evaluated in the past for similar lower extremity ulcers with associated cellulitis.  Her last admission was at [facility] in December 2023.  At that time, patient met SIRS criteria and required admission.  She was treated with vancomycin and cefepime.  She has since been following up with wound care clinic as outpatient.  She presented today with new extremity ulcers and associated pain.  No obvious drainage from wound site.  X-rays obtained showed no bony involvement.  Patient was initiated on IV antibiotics with cefepime and vancomycin.  No obvious leukocytosis or left shift.  ED consulted vascular surgeon Dr. Franchot Gallo who recommended initiating patient on heparin protocol pending evaluation in a.m. for possible angiogram.  This explained to patient and family.  They agree with this plan as detailed.   Review of Systems: As mentioned in the history of present illness. All other systems reviewed and are negative. 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 Candace Cruise   EYE SURGERY  2002   FOOT SURGERY  1996   HERNIA REPAIR  2013   left breast biopsy   2011   Social History:  reports that she has never smoked. She has never used smokeless tobacco. She reports that she does not drink alcohol and does not use drugs.  Allergies  Allergen Reactions   Aspirin Itching and Other (See Comments)    Dizzy/passing out   Augmentin [Amoxicillin-Pot Clavulanate]    Codeine Other (See Comments)    dizziness   Doxycycline Hyclate Other (See Comments)   Sulfamethoxazole-Trimethoprim Diarrhea   Tape Itching   Vitamin D Analogs    Azithromycin Rash and Hives    Family History  Problem Relation Age of Onset   Heart disease Mother    Emphysema Mother    Arthritis Father    Cancer Father    Breast cancer Neg Hx     Prior to Admission medications   Medication Sig Start Date End Date Taking? Authorizing Provider  acetaminophen (TYLENOL) 500 MG tablet Take 500 mg by mouth 3 (three) times daily.    [provider]  amiodarone (PACERONE) 100 MG tablet Take 100 mg by mouth daily.    [provider]  apixaban (ELIQUIS) 2.5 MG  TABS tablet Take 1 tablet (2.5 mg total) by mouth 2 (two) times daily. 07/06/22   Wouk, Ailene Rud, MD  calcium carbonate (OSCAL) 1500 (600 Ca) MG TABS tablet Take 600 mg of elemental calcium by mouth 2 (two) times daily with a meal.    [provider]  Dextromethorphan-guaiFENesin 20-400 MG TABS Take 1 tablet by mouth 2 (two) times daily.    [provider]  diclofenac Sodium (VOLTAREN) 1 % GEL Apply 4 g topically 4 (four) times daily.    [provider]  ferrous sulfate 325 (65 FE) MG EC tablet Take 325 mg by mouth every other day.    [provider]  gabapentin (NEURONTIN) 100 MG capsule Take 2 tabs 3 x day for  neuropathy by mouth 11/10/21   [name], MD  hydroxychloroquine (PLAQUENIL) 200 MG tablet Take 200 mg by mouth daily.    [provider]  [place] [facility]) OINT Apply to buttock wound topically every day and evening shift for skin protection    [provider]  lactose free nutrition (BOOST) LIQD Take 237 mLs by mouth 3 (three) times daily between meals.    [provider]  levothyroxine (SYNTHROID) 50 MCG tablet Take 50 mcg by mouth daily before breakfast.    [provider]  metoprolol tartrate (LOPRESSOR) 25 MG tablet Take 0.5 tablets (12.5 mg total) by mouth 2 (two) times daily. 07/06/22   Wouk, Ailene Rud, MD  Multiple Vitamins-Minerals (DECUBI-VITE PO) Take 1 tablet by mouth daily. For wound healing    [provider]  ondansetron (ZOFRAN) 8 MG tablet Take 8 mg by mouth every 8 (eight) hours as needed for nausea or vomiting.    [provider]  pantoprazole (PROTONIX) 40 MG tablet TAKE 1 TABLET TWICE DAILY 06/05/22   Jonetta Osgood, NP    Physical Exam: Vitals:   11/22/22 1646 11/22/22 1700 11/22/22 1730 11/22/22 1830  BP: (!) 104/7 110/86 114/75 (!) 92/51  Pulse: (!) 108  (!) 136 93  Resp: 17 (!) '22 15 12  '$ Temp:      TempSrc:      SpO2: 99%  100% 99%   General: Patient is an elderly and frail female.  Not in any acute distress.  She is alert oriented x 3.  Daughter is at bedside. HEENT: Conjunctiva is red.  Patient apparently was being treated for pinkeye.  Oral mucosa moist. Neck: Supple with no palpable thyromegaly. Chest: Clinically diminished with no added sounds. Cardiovascular: Regular S1-S2 Extremities: Significant for trace lower extremity edema.  No evidence of cyanosis.  Findings of ulcers on left big toe and fifth toe.  There is also a lesion on the left lower extremity shin and heel.  Surrounding erythema on the big toe noted.  No evidence of bony involvement at this time. CNS: Shows no obvious  focal deficit. Data Reviewed:  Sodium is 137, potassium 3.8, chloride 104, bicarb 22, BUN 34, creatinine 1.24 calcium 8.5 albumin 2.3.  AST 19 ALT 7.  WBC 7.3, hemoglobin 11.3, hematocrit 37, platelet count 338.  Sed rate is 37.  X-ray of the lower extremity shows mild soft tissue swelling without any evidence of bone involvement.  Assessment and Plan: 89 year old female with history of polymyalgia, atrial fibrillation on anticoagulation, who presents for the third time in 6 months with left lower extremity ulcers.  Ulcers thought to be unprovoked.  Has been previously treated at [facility] and follows up with wound care clinic.  Patient will be treated for cellulitis and further assessment by vascular team to rule out any peripheral arterial disease  Recurrent left lower extremity cellulitis: New findings of ulcers on toes and on the heel.  There is also a lesion on anterior shin.  Considering recurrence of these ulcers, vascular surgery has been consulted to evaluate for peripheral vascular disease.  Pulses on left foot mildly palpable.  ED did discuss case with on-call vascular surgeon.  Recommendation for patient to be initiated on heparin pending evaluation for procedure in AM.  Atrial fibrillation on anticoagulation with Eliquis.  Rate is currently controlled on current regimen.  Will continue with same due to close stay.  Eliquis on hold as patient has been initiated on heparin.  CKD stage IIIa: Stable renal function.  Aneurysmal suprarenal abdominal aorta (3.2 cm) : chronic.  Hypotension: Patient does not exhibit any signs of sepsis at this time.  Will challenge with IV fluids.  Hold BP medications if systolic blood pressure less than 100  Polymyalgia rheumatic/inflammatory arthritis: Patient is on chronic Plaquenil.  CHF: Combined diastolic and systolic: LVEF of 45 to [id number].  Not in acute exacerbation at this time.  Conjunctivitis: Ofloxacin eyedrops will be offered.  Hypothyroidism: Continue  on Synthroid.     Advance Care Planning:   Code Status: DNR   Consults: Vascular surgery was consulted form the ED  Family Communication: Daughter at bedside was updated  Severity of Illness: The appropriate patient status for this patient is INPATIENT. Inpatient status is judged to be reasonable and necessary in order to provide the required intensity of service to ensure the patient's safety. The patient's presenting symptoms, physical exam findings, and initial radiographic and laboratory data in the context of their chronic comorbidities is felt to place them at high risk for further clinical deterioration. Furthermore, it is not anticipated that the patient will be medically stable for discharge from the hospital within 2 midnights of admission.   * I certify that at the point of admission it is my clinical judgment that the patient will require inpatient hospital care spanning beyond 2 midnights from the point of admission due to high intensity of service, high risk for further deterioration and high frequency of surveillance required.*  Author: Artist Beach, MD 11/22/2022 7:03 PM  For on call review [email or web address].

## 2022-11-22 NOTE — Progress Notes (Signed)
       CROSS COVER NOTE  NAME: Stacy [name] MRN: [id number] DOB : 1927/08/27 ATTENDING PHYSICIAN: Artist Beach, MD    Date of Service   11/22/2022   HPI/Events of Note   Report *** 8/10 leg pain requesting IV medication and antiemetic On Review of chart *** Bedside eval*** HPI***  Interventions   Assessment/Plan: Morphine Phenergan  Atrial Fibrillation with Rapid Ventricular Response - BP 94/67 PO Lopressor - HR 122 at bedside  Hypoxia 2L O2 CXR - pt had episode of emesis. R/O aspiration vs edema from fluid bolus    *** professional thanks      To reach the provider On-Call:   7AM- 7PM see care teams to locate the attending and reach out to them via [email or web address]. Password: [email or web address] 7PM-7AM contact night-coverage If you still have difficulty reaching the appropriate provider, please page the [facility] (Director on Call) for [facility] on amion for assistance  This document was prepared using Systems analyst and may include unintentional dictation errors.  Neomia Glass DNP, MBA, FNP-BC, PMHNP-BC Nurse Practitioner [facility] Pager [phone]

## 2022-11-22 NOTE — Consult Note (Signed)
ANTICOAGULATION CONSULT NOTE - Initial Consult  Pharmacy Consult for heparin infusion Indication:  acute limb ischemia, hx of afib on apixaban PTA  Allergies  Allergen Reactions   Aspirin Itching and Other (See Comments)    Dizzy/passing out   Augmentin [Amoxicillin-Pot Clavulanate]    Codeine Other (See Comments)    dizziness   Doxycycline Hyclate Other (See Comments)   Sulfamethoxazole-Trimethoprim Diarrhea   Tape Itching   Vitamin D Analogs    Azithromycin Rash and Hives    Patient Measurements:   Heparin Dosing Weight: 75.3 kg  Vital Signs: Temp: 98 F (36.7 C) (02/27 1553) Temp Source: Oral (02/27 1553) BP: 114/75 (02/27 1730) Pulse Rate: 136 (02/27 1730)  Labs: Recent Labs    11/22/22 1612  HGB 11.3*  HCT 37.0  PLT 338  APTT 53*  LABPROT 24.8*  INR 2.3*  CREATININE 1.24*    Estimated Creatinine Clearance: 28.1 mL/min (A) (by C-G formula based on SCr of 1.24 mg/dL (H)).   Medical History: Past Medical History:  Diagnosis Date   Acute colitis on CT 03/07/2021   Arthritis 1940   Asthma    Bowel trouble    Cancer ([place]) 2006   DCIS left breast   Hypertension 1940   Incisional hernia 2014   Other primary ovarian failure 09/21/2017   Personal history of radiation therapy    Polymyalgia ([place])    Thyroid disorder    Vitiligo 2012    Medications:  PTA: apixaban 2.5 mg BID - last dose 2/27 AM  Assessment: 89 y.o. female past medical history significant for prior breast cancer, hypertension, atrial fibrillation on Eliquis, who presents to the emergency department  from wound clinic for concern of decreased blood flow to left foot - acute limb ischemia .   Baseline: aPTT 53, INR 2.3  Goal of Therapy:  Heparin level 0.3-0.7 units/ml aPTT 66-102 seconds Monitor platelets by anticoagulation protocol: Yes   Plan:  Start heparin infusion at 900 units/hr Check aPTT level in 8 hours due to recent DOAC exposure Transition to HL once HL therapeutic and  correlating with aPTT Continue to monitor H&H and platelets  Dorothe Pea, PharmD, BCPS Clinical Pharmacist   11/22/2022,6:24 PM

## 2022-11-22 NOTE — ED Triage Notes (Signed)
Pt arrives via EMS from the wound clinic with concerns for decreased blood flow to her L foot. Pt has chronic wounds to foot that are worsening and a new wound that appeared.

## 2022-11-22 NOTE — Consult Note (Signed)
PHARMACY -  BRIEF ANTIBIOTIC NOTE   Pharmacy has received consult(s) for vancomycin and cefepime from an ED provider.  The patient's profile has been reviewed for ht/wt/allergies/indication/available labs.    One time order(s) placed for  Cefepime 2 gram Vancomycin 1500 mg   Further antibiotics/pharmacy consults should be ordered by admitting physician if indicated.                       Thank you, Dorothe Pea, PharmD, BCPS Clinical Pharmacist   11/22/2022  6:34 PM

## 2022-11-22 NOTE — Consult Note (Signed)
Pharmacy Antibiotic Note  Stacy [name] is a 89 y.o. female admitted on 11/22/2022 with  wound infection .  Pharmacy has been consulted for vancomycin dosing.  Plan: Vancomycin '1500mg'$  IV x1 followed by vancomycin '1250mg'$  IV every 48 hours Goal AUC 400-550  Est AUC: 485.8 Est Cmax: 34.6 Est Cmin: 11.0 Calculated with SCr 1.24 mg/dL   Height: '5\' 4"'$  (162.6 cm) Weight: 72.3 kg (159 lb 6.3 oz) IBW/kg (Calculated) : 54.7  Temp (24hrs), Avg:97.7 F (36.5 C), Min:97.5 F (36.4 C), Max:98 F (36.7 C)  Recent Labs  Lab 11/22/22 1612  WBC 7.3  CREATININE 1.24*  LATICACIDVEN 1.5    Estimated Creatinine Clearance: 27.6 mL/min (A) (by C-G formula based on SCr of 1.24 mg/dL (H)).    Allergies  Allergen Reactions   Aspirin Itching and Other (See Comments)    Dizzy/passing out   Augmentin [Amoxicillin-Pot Clavulanate]    Codeine Other (See Comments)    dizziness   Doxycycline Hyclate Other (See Comments)   Sulfamethoxazole-Trimethoprim Diarrhea   Tape Itching   Vitamin D Analogs    Azithromycin Rash and Hives    Antimicrobials this admission: 2/27 vancomycin >>    Microbiology results: 2/27 BCx: sent 2/27 MRSA PCR: sent  Thank you for allowing pharmacy to be a part of this patient's care.  Darrick Penna 11/22/2022 8:30 PM

## 2022-11-22 NOTE — ED Provider Notes (Addendum)
[facility] Provider Note    Event Date/Time   First MD Initiated Contact with Patient 11/22/22 1555     (approximate)   History   Wound Check   HPI  Stacy [name] is a 89 y.o. female past medical history significant for prior breast cancer, hypertension, fibromyalgia, atrial fibrillation on Eliquis, who presents to the emergency department with wounds to her lower extremity.  Patient was evaluated by PA today and was sent to the emergency department given concern for acute ischemic leg.  Worsening pain to the left leg today.  Wounds to the left lower extremity that has been worsening.  Denies any fevers but does state that she has had some chills.  Denies any nausea or vomiting.  No recent antibiotic use.  Believes that she took her Eliquis this morning.     Physical Exam   Triage Vital Signs: ED Triage Vitals  Enc Vitals Group     BP 11/22/22 1554 102/80     Pulse Rate 11/22/22 1557 (!) 128     Resp 11/22/22 1554 19     Temp 11/22/22 1553 98 F (36.7 C)     Temp Source 11/22/22 1553 Oral     SpO2 11/22/22 1554 100 %     Weight --      Height --      Head Circumference --      Peak Flow --      Pain Score 11/22/22 1553 10     Pain Loc --      Pain Edu? --      Excl. in [place]? --     Most recent vital signs: Vitals:   11/22/22 1700 11/22/22 1730  BP: 110/86 114/75  Pulse:  (!) 136  Resp: (!) 22 15  Temp:    SpO2:  100%    Physical Exam Constitutional:      Appearance: She is well-developed.  HENT:     Head: Atraumatic.  Eyes:     Conjunctiva/sclera: Conjunctivae normal.  Cardiovascular:     Rate and Rhythm: Tachycardia present.  Pulmonary:     Effort: No respiratory distress.  Abdominal:     General: There is no distension.  Musculoskeletal:        General: Normal range of motion.     Cervical back: Normal range of motion.  Skin:    General: Skin is warm.     Comments: Wound to the left lower extremity, left lateral foot and  left great toe.  See imaging in media tab and below.  Unable to palpate bilateral DP or PT pulses.  Able to Doppler pulse.  Neurological:     Mental Status: She is alert. Mental status is at baseline.              IMPRESSION / MDM / ASSESSMENT AND PLAN / ED COURSE  I reviewed the triage vital signs and the nursing notes.  Differential diagnosis including osteomyelitis, acute arterial occlusion, chronic venous stasis ulcers, diabetes, infectious process  EKG  I, Nathaniel Man, the attending physician, personally viewed and interpreted this ECG.   Rate: 108  Rhythm atrial fibrillation  Axis: Normal  Intervals: Normal  ST&T Change: Nonspecific ST changes  Atrial fibrillation with a rapid rate while on cardiac telemetry .  RADIOLOGY I independently reviewed imaging, my interpretation of imaging: X-ray of the left foot  X-ray read soft tissue swelling without obvious findings of osteo  LABS (all labs ordered are listed,  but only abnormal results are displayed) Labs interpreted as -  No significant leukocytosis.  Normal lactic acid.  Elevated ESR  Labs Reviewed  CBC - Abnormal; Notable for the following components:      Result Value   Hemoglobin 11.3 (*)    RDW 22.8 (*)    All other components within normal limits  COMPREHENSIVE METABOLIC PANEL - Abnormal; Notable for the following components:   BUN 34 (*)    Creatinine, Ser 1.24 (*)    Calcium 8.5 (*)    Total Protein 6.4 (*)    Albumin 2.3 (*)    GFR, Estimated 41 (*)    All other components within normal limits  SEDIMENTATION RATE - Abnormal; Notable for the following components:   Sed Rate 37 (*)    All other components within normal limits  PROTIME-INR - Abnormal; Notable for the following components:   Prothrombin Time 24.8 (*)    INR 2.3 (*)    All other components within normal limits  APTT - Abnormal; Notable for the following components:   aPTT 53 (*)    All other components within normal limits   CULTURE, BLOOD (ROUTINE X 2)  CULTURE, BLOOD (ROUTINE X 2)  LACTIC ACID, PLASMA  C-REACTIVE PROTEIN    TREATMENT  Heparin, IV vancomycin, cefepime and Flagyl  MDM    Patient last took her Eliquis this morning.  Does have a dopplerable pulse bilaterally but no palpable pulses.  Concern for peripheral arterial disease that is causing ulcers with possible osteomyelitis to the foot.  Does not appear septic at this time.  Given IV pain medication  Consulted and discussed the patient's case with vascular surgery Dr. Delana Meyer, recommended started on heparin infusion and admission to the hospitalist, plan for likely arteriogram and stenting tomorrow.  Consulted hospitalist for admission.  After discussion with the hospitalist we will go ahead and start the patient on antibiotics.  Has a penicillin allergy.  Started on IV vancomycin, cefepime and Flagyl.   PROCEDURES:  Critical Care performed: yes  .Critical Care  Performed by: Nathaniel Man, MD Authorized by: Nathaniel Man, MD   Critical care provider statement:    Critical care time (minutes):  40   Critical care time was exclusive of:  Separately billable procedures and treating other patients   Critical care was necessary to treat or prevent imminent or life-threatening deterioration of the following conditions:  Circulatory failure   Critical care was time spent personally by me on the following activities:  Development of treatment plan with patient or surrogate, discussions with consultants, evaluation of patient's response to treatment, examination of patient, ordering and review of laboratory studies, ordering and review of radiographic studies, ordering and performing treatments and interventions, pulse oximetry, re-evaluation of patient's condition and review of old charts   Patient's presentation is most consistent with acute presentation with potential threat to life or bodily function.   MEDICATIONS ORDERED IN  ED: Medications  sodium chloride 0.9 % bolus 1,000 mL (1,000 mLs Intravenous New Bag/Given 11/22/22 1616)  morphine (PF) 4 MG/ML injection 4 mg (4 mg Intravenous Given 11/22/22 1617)    FINAL CLINICAL IMPRESSION(S) / ED DIAGNOSES   Final diagnoses:  Wound infection  PAD (peripheral artery disease) (HCC)  Penetrating wound of left foot, initial encounter     Rx / DC Orders   ED Discharge Orders     None        Note:  This document was prepared using Dragon voice recognition  software and may include unintentional dictation errors.   Nathaniel Man, MD 11/22/22 Dionicia Abler    Nathaniel Man, MD 11/22/22 [id number]

## 2022-11-23 ENCOUNTER — Inpatient Hospital Stay (HOSPITAL_COMMUNITY)
Admit: 2022-11-23 | Discharge: 2022-11-23 | Disposition: A | Discharge status: Home / Self Care | Payer: Medicare Other | Attending: Student | Admitting: Student

## 2022-11-23 ENCOUNTER — Encounter
Admission: EM | Disposition: A | Discharge status: Skilled Nursing Facility | Payer: Self-pay | Source: Home / Self Care | Attending: Student

## 2022-11-23 DIAGNOSIS — R7881 Bacteremia: Secondary | ICD-10-CM

## 2022-11-23 DIAGNOSIS — A491 Streptococcal infection, unspecified site: Secondary | ICD-10-CM | POA: Diagnosis not present

## 2022-11-23 DIAGNOSIS — S41109A Unspecified open wound of unspecified upper arm, initial encounter: Secondary | ICD-10-CM

## 2022-11-23 DIAGNOSIS — S81809A Unspecified open wound, unspecified lower leg, initial encounter: Secondary | ICD-10-CM

## 2022-11-23 DIAGNOSIS — L03116 Cellulitis of left lower limb: Secondary | ICD-10-CM | POA: Diagnosis not present

## 2022-11-23 LAB — BLOOD CULTURE ID PANEL (REFLEXED) - BCID2

## 2022-11-23 LAB — GLUCOSE, CAPILLARY
Glucose-Capillary: 104 mg/dL — ABNORMAL HIGH (ref 70–99)
Glucose-Capillary: 62 mg/dL — ABNORMAL LOW (ref 70–99)
Glucose-Capillary: 157 mg/dL — ABNORMAL HIGH (ref 70–99)
Glucose-Capillary: 76 mg/dL (ref 70–99)
Glucose-Capillary: 94 mg/dL (ref 70–99)
Glucose-Capillary: 111 mg/dL — ABNORMAL HIGH (ref 70–99)

## 2022-11-23 LAB — BASIC METABOLIC PANEL
Anion gap: 9 (ref 5–15)
BUN: 34 mg/dL — ABNORMAL HIGH (ref 8–23)
CO2: 20 mmol/L — ABNORMAL LOW (ref 22–32)
Calcium: 7.6 mg/dL — ABNORMAL LOW (ref 8.9–10.3)
Chloride: 109 mmol/L (ref 98–111)
Creatinine, Ser: 1.09 mg/dL — ABNORMAL HIGH (ref 0.44–1.00)
GFR, Estimated: 47 mL/min — ABNORMAL LOW (ref >60)
Glucose, Bld: 67 mg/dL — ABNORMAL LOW (ref 70–99)
Potassium: 3.8 mmol/L (ref 3.5–5.1)
Sodium: 138 mmol/L (ref 135–145)

## 2022-11-23 LAB — CBC
HCT: 27.2 % — ABNORMAL LOW (ref 36.0–46.0)
Hemoglobin: 8.9 g/dL — ABNORMAL LOW (ref 12.0–15.0)
MCH: 30.3 pg (ref 26.0–34.0)
MCHC: 32.7 g/dL (ref 30.0–36.0)
MCV: 92.5 fL (ref 80.0–100.0)
Platelets: 238 10*3/uL (ref 150–400)
RBC: 2.94 MIL/uL — ABNORMAL LOW (ref 3.87–5.11)
RDW: 22.3 % — ABNORMAL HIGH (ref 11.5–15.5)
WBC: 6.7 10*3/uL (ref 4.0–10.5)
nRBC: 0 % (ref 0.0–0.2)

## 2022-11-23 LAB — APTT
aPTT: 155 seconds — ABNORMAL HIGH (ref 24–36)
aPTT: 86 seconds — ABNORMAL HIGH (ref 24–36)

## 2022-11-23 LAB — HEPARIN LEVEL (UNFRACTIONATED): Heparin Unfractionated: >1.1 IU/mL — ABNORMAL HIGH (ref 0.30–0.70)

## 2022-11-23 LAB — C-REACTIVE PROTEIN: CRP: 7.2 mg/dL — ABNORMAL HIGH (ref <1.0)

## 2022-11-23 LAB — TSH: TSH: 10.939 u[IU]/mL — ABNORMAL HIGH (ref 0.350–4.500)

## 2022-11-23 LAB — HEMOGLOBIN AND HEMATOCRIT, BLOOD
HCT: 28.7 % — ABNORMAL LOW (ref 36.0–46.0)
Hemoglobin: 9.1 g/dL — ABNORMAL LOW (ref 12.0–15.0)

## 2022-11-23 LAB — MRSA NEXT GEN BY PCR, NASAL: MRSA by PCR Next Gen: NOT DETECTED

## 2022-11-23 SURGERY — LOWER EXTREMITY ANGIOGRAPHY
Anesthesia: Moderate Sedation | Laterality: Left

## 2022-11-23 MED ORDER — HEPARIN (PORCINE) 25000 UT/250ML-% IV SOLN
1000.0000 [IU]/h | INTRAVENOUS | Status: DC
  Administered 2022-11-24 – 2022-11-26 (×2): 600 [IU]/h via INTRAVENOUS
  Administered 2022-11-27: 850 [IU]/h via INTRAVENOUS
  Filled 2022-11-23 (×3): qty 250

## 2022-11-23 MED ORDER — SODIUM CHLORIDE 0.9 % IV BOLUS
500.0000 mL | Freq: Once | INTRAVENOUS | Status: AC
  Administered 2022-11-23: 500 mL via INTRAVENOUS

## 2022-11-23 MED ORDER — METOPROLOL TARTRATE 25 MG PO TABS: 12.5000 mg | ORAL_TABLET | Freq: Two times a day (BID) | ORAL | Status: DC

## 2022-11-23 MED ORDER — DEXTROSE 50 % IV SOLN: 12.5000 g | INTRAVENOUS | Status: AC

## 2022-11-23 MED ORDER — LINEZOLID 600 MG/300ML IV SOLN
600.0000 mg | Freq: Two times a day (BID) | INTRAVENOUS | Status: DC
  Administered 2022-11-23 – 2022-11-27 (×9): 600 mg via INTRAVENOUS
  Filled 2022-11-23 (×11): qty 300

## 2022-11-23 MED ORDER — ZINC SULFATE 220 (50 ZN) MG PO CAPS
220.0000 mg | ORAL_CAPSULE | Freq: Every day | ORAL | Status: DC
  Administered 2022-11-23 – 2022-12-02 (×10): 220 mg via ORAL
  Filled 2022-11-23 (×10): qty 1

## 2022-11-23 MED ORDER — LINEZOLID 600 MG PO TABS
600.0000 mg | ORAL_TABLET | Freq: Two times a day (BID) | ORAL | Status: DC
  Administered 2022-11-23: 600 mg via ORAL
  Filled 2022-11-23: qty 1

## 2022-11-23 MED ORDER — MIDODRINE HCL 5 MG PO TABS
5.0000 mg | ORAL_TABLET | Freq: Three times a day (TID) | ORAL | Status: DC
  Administered 2022-11-23 – 2022-11-30 (×23): 5 mg via ORAL
  Filled 2022-11-23 (×24): qty 1

## 2022-11-23 MED ORDER — METOPROLOL TARTRATE 25 MG PO TABS
12.5000 mg | ORAL_TABLET | Freq: Two times a day (BID) | ORAL | Status: DC
  Filled 2022-11-23: qty 1

## 2022-11-23 MED ORDER — DEXTROSE 50 % IV SOLN
INTRAVENOUS | Status: AC
  Administered 2022-11-23: 12.5 g via INTRAVENOUS
  Filled 2022-11-23: qty 50

## 2022-11-23 MED ORDER — DEXTROSE 50 % IV SOLN
INTRAVENOUS | Status: AC
  Administered 2022-11-23: 50 mL
  Filled 2022-11-23: qty 50

## 2022-11-23 MED ORDER — ADULT MULTIVITAMIN W/MINERALS CH
1.0000 | ORAL_TABLET | Freq: Every day | ORAL | Status: DC
  Administered 2022-11-23 – 2022-12-02 (×10): 1 via ORAL
  Filled 2022-11-23 (×10): qty 1

## 2022-11-23 MED ORDER — VITAMIN C 500 MG PO TABS
500.0000 mg | ORAL_TABLET | Freq: Two times a day (BID) | ORAL | Status: DC
  Administered 2022-11-23 – 2022-12-02 (×18): 500 mg via ORAL
  Filled 2022-11-23 (×18): qty 1

## 2022-11-23 MED ORDER — CEFAZOLIN SODIUM-DEXTROSE 2-4 GM/100ML-% IV SOLN
2.0000 g | Freq: Three times a day (TID) | INTRAVENOUS | Status: DC
  Administered 2022-11-23 – 2022-11-27 (×12): 2 g via INTRAVENOUS
  Filled 2022-11-23 (×12): qty 100

## 2022-11-23 MED ORDER — DEXTROSE 50 % IV SOLN
1.0000 | Freq: Once | INTRAVENOUS | Status: AC
  Administered 2022-11-23: 50 mL via INTRAVENOUS

## 2022-11-23 NOTE — Progress Notes (Signed)
PHARMACY - PHYSICIAN COMMUNICATION CRITICAL VALUE ALERT - BLOOD CULTURE IDENTIFICATION (BCID)  Stacy [name] is an 89 y.o. female who presented to [facility] on 11/22/2022 with a chief complaint of cellulitis.   Assessment:  Strep pyogenes (Group A) in 1 of 4 bottles, anaerobic,  no resistance detected.  (include suspected source if known)  Name of physician (or Provider) Contacted: Neomia Glass, NP  Current antibiotics: metronidazole, Vanc  Changes to prescribed antibiotics recommended:  Penicillins 4 million IV Q4H is preferred .  However , Pt has PCN allergy so will continue Vanc but d/c metronidazole.   Results for orders placed or performed during the hospital encounter of 11/22/22  Blood Culture ID Panel (Reflexed) (Collected: 11/22/2022  4:12 PM)  Result Value Ref Range   Enterococcus faecalis NOT DETECTED NOT DETECTED   Enterococcus Faecium NOT DETECTED NOT DETECTED   Listeria monocytogenes NOT DETECTED NOT DETECTED   Staphylococcus species NOT DETECTED NOT DETECTED   Staphylococcus aureus (BCID) NOT DETECTED NOT DETECTED   Staphylococcus epidermidis NOT DETECTED NOT DETECTED   Staphylococcus lugdunensis NOT DETECTED NOT DETECTED   Streptococcus species DETECTED (A) NOT DETECTED   Streptococcus agalactiae NOT DETECTED NOT DETECTED   Streptococcus pneumoniae NOT DETECTED NOT DETECTED   Streptococcus pyogenes DETECTED (A) NOT DETECTED   A.calcoaceticus-baumannii NOT DETECTED NOT DETECTED   Bacteroides fragilis NOT DETECTED NOT DETECTED   Enterobacterales NOT DETECTED NOT DETECTED   Enterobacter cloacae complex NOT DETECTED NOT DETECTED   Escherichia coli NOT DETECTED NOT DETECTED   Klebsiella aerogenes NOT DETECTED NOT DETECTED   Klebsiella oxytoca NOT DETECTED NOT DETECTED   Klebsiella pneumoniae NOT DETECTED NOT DETECTED   Proteus species NOT DETECTED NOT DETECTED   Salmonella species NOT DETECTED NOT DETECTED   Serratia marcescens NOT DETECTED NOT DETECTED   Haemophilus  influenzae NOT DETECTED NOT DETECTED   Neisseria meningitidis NOT DETECTED NOT DETECTED   Pseudomonas aeruginosa NOT DETECTED NOT DETECTED   Stenotrophomonas maltophilia NOT DETECTED NOT DETECTED   Candida albicans NOT DETECTED NOT DETECTED   Candida auris NOT DETECTED NOT DETECTED   Candida glabrata NOT DETECTED NOT DETECTED   Candida krusei NOT DETECTED NOT DETECTED   Candida parapsilosis NOT DETECTED NOT DETECTED   Candida tropicalis NOT DETECTED NOT DETECTED   Cryptococcus neoformans/gattii NOT DETECTED NOT DETECTED    Ryla Cauthon D 11/23/2022  6:09 AM

## 2022-11-23 NOTE — Progress Notes (Signed)
Initial Nutrition Assessment  DOCUMENTATION CODES:   Not applicable  INTERVENTION:   -Magic cup TID with meals, each supplement provides 290 kcal and 9 grams of protein  -MVI with minerals daily -500 mg vitamin C BID -220 mg zinc sulfate daily x 14 days  NUTRITION DIAGNOSIS:   Increased nutrient needs related to wound healing as evidenced by estimated needs.  GOAL:   Patient will meet greater than or equal to 90% of their needs  MONITOR:   PO intake, Supplement acceptance  REASON FOR ASSESSMENT:   Malnutrition Screening Tool    ASSESSMENT:   Pt with medical history significant of hypertension, fibromyalgia, atrial fibrillation on anticoagulation with Eliquis,AAA history of breast cancer.  She presents with new left lower extremity ulcers on first and fifth digit.  Pt admitted with recurrent lt lower extremity cellulitis.   Reviewed I/O's: +1 L x 24 hours  Case discussed with RN. Pt has improved since this morning. Noted that pt had hypoglycemic episode this morning. Pt was able to eat some food today. Noted plan for NPO for possible angiogram tomorrow.   Spoke with pt and family at bedside. Pt currently resides at [facility]. Pt family reports that she has been staying there since December 2023. Pt has had wounds on her legs and feet PTA to [facility]. Pt receives wound care at the facility, but family unsure if pt was taking supplements or vitamins PTA.   Pt's has had decreased oral intake over the past few weeks. Per son, pt consumes 1-2 meals poer day, but mainly consumes bites of grilled cheese and tomato soup. Today, she consumed a few bites of spaghetti, an ice pop, and ice cream. Pt has dentures and tries to restrict her sodium. Pt family amenable to continuing regular diet to help optimize oral intake.   Reviewed wt hx; wt has been stable over the past 6 months. Pt family suspect she may have lost weight, but unsure. Pt is weighed daily secondary to CHF.    Discussed importance of good meal and supplement intake to promote healing.   Medications reviewed and include calcium carbonate and ferrous sulfate.  Labs reviewed: CBGS: 76-157 (inpatient orders for glycemic control are none).    NUTRITION - FOCUSED PHYSICAL EXAM:  Flowsheet Row Most Recent Value  Orbital Region No depletion  Upper Arm Region Moderate depletion  Thoracic and Lumbar Region No depletion  Buccal Region No depletion  Temple Region Mild depletion  Clavicle Bone Region No depletion  Clavicle and Acromion Bone Region No depletion  Scapular Bone Region No depletion  Dorsal Hand No depletion  Patellar Region Mild depletion  Anterior Thigh Region Mild depletion  Posterior Calf Region Mild depletion  Edema (RD Assessment) Mild  Hair Reviewed  Eyes Reviewed  Mouth Reviewed  Skin Reviewed  Nails Reviewed       Diet Order:   Diet Order             Diet NPO time specified  Diet effective midnight           Diet regular Fluid consistency: Thin  Diet effective now                   EDUCATION NEEDS:   Education needs have been addressed  Skin:  Skin Assessment: Skin Integrity Issues: Skin Integrity Issues:: Other (Comment) Other: nonpressure wounds to lt great toe, lt heel, 5th toe, and lt pretibial; IAD to coccyx  Last BM:  11/22/22  Height:  Ht Readings from Last 1 Encounters:  11/22/22 '5\' 4"'$  (1.626 m)    Weight:   Wt Readings from Last 1 Encounters:  11/22/22 72.3 kg    Ideal Body Weight:  54.5 kg  BMI:  Body mass index is 27.36 kg/m.  Estimated Nutritional Needs:   Kcal:  1950-2150  Protein:  90-105 grams  Fluid:  > 1.9 L    Loistine Chance, RD, LDN, [place] Registered Dietitian II Certified Diabetes Care and Education Specialist Please refer to [facility] for RD and/or RD on-call/weekend/after hours pager

## 2022-11-23 NOTE — Consult Note (Signed)
NAME: Stacy [name]  DOB: 03-10-1927  MRN: [id number]  Date/Time: 11/23/2022 2:38 PM  REQUESTING PROVIDER: Dr: Dwyane Dee Subjective:  REASON FOR CONSULT: Group A streptococcus bacteremia Leg wounds ? Stacy [name] is a 89 y.o. with a history of HTN, Afib om eliquis, AAA, breast ca , PMR on hydroxyplaquenil and prednisone presented to the ED sent from the wound clinic for worsening wounds on her extremites- she has had this for many months now 09/09/22-09/13/22 was in [facility] for cellulitis left leg,sent home on Po doxy/kefles after initial IV vanco/cefepime She is being followed at the wound clinic and was referred here as the the left great toe wound was getting worse As per family at bed side it started as blisters on hte legs  with swelling of the leg and then the blistered ruptured to leave a wound No fever Pt has not been mobile in the past 2 months- before that she would sit in chair  Vitals in the ED  11/22/22  BP 91/66  Temp 98.1 F (36.7 C)  Pulse Rate 110 !  Resp 18  SpO2 96 %  O2 Flow Rate (L/min) 2 L/min  Weight 159 lb 6.3 oz  Height '5\' 4"'$  (1.626 m)    Latest Reference Range & Units 11/22/22  WBC 4.0 - 10.5 K/uL 7.3  Hemoglobin 12.0 - 15.0 g/dL 11.3 (L)  HCT 36.0 - 46.0 % 37.0  Platelets 150 - 400 K/uL 338  Creatinine 0.44 - 1.00 mg/dL 1.24 (H)   Blood culture and started on vanco/cefepime and flagyl which was changed to cefazolin As blood culture came back as Group A strep linezolid was added I am seeing the patient for the infection Past Medical History:  Diagnosis Date   Acute colitis on CT 03/07/2021   Arthritis 1940   Asthma    Bowel trouble    Cancer ([place]) 2006   DCIS left breast   Hypertension 1940   Incisional hernia 2014   Other primary ovarian failure 09/21/2017   Personal history of radiation therapy    Polymyalgia ([place])    Thyroid disorder    Vitiligo 2012    Past Surgical History:  Procedure Laterality Date   BREAST BIOPSY Left 2006   +   BREAST  SURGERY Left 2006   lumpectomy   [place]  2004   COLONOSCOPY  2014   Dr. Candace Cruise   EYE SURGERY  2002   FOOT SURGERY  1996   HERNIA REPAIR  2013   left breast biopsy   2011    Social History   Socioeconomic History   Marital status: Widowed    Spouse name: Not on file   Number of children: Not on file   Years of education: Not on file   Highest education level: Not on file  Occupational History   Not on file  Tobacco Use   Smoking status: Never   Smokeless tobacco: Never  Vaping Use   Vaping Use: Never used  Substance and Sexual Activity   Alcohol use: No   Drug use: No   Sexual activity: Not on file  Other Topics Concern   Not on file  Social History Narrative   Not on file   Social Determinants of Health   Financial Resource Strain: Low Risk  (04/07/2021)   Overall Financial Resource Strain (CARDIA)    Difficulty of Paying Living Expenses: Not very hard  Food Insecurity: No Food Insecurity (11/22/2022)   Hunger  Vital Sign    Worried About Charity fundraiser in the Last Year: Never true    Ran Out of Food in the Last Year: Never true  Transportation Needs: No Transportation Needs (11/22/2022)   PRAPARE - Hydrologist (Medical): No    Lack of Transportation (Non-Medical): No  Physical Activity: Not on file  Stress: Not on file  Social Connections: Not on file  Intimate Partner Violence: Not At Risk (11/22/2022)   Humiliation, Afraid, Rape, and Kick questionnaire    Fear of Current or Ex-Partner: No    Emotionally Abused: No    Physically Abused: No    Sexually Abused: No    Family History  Problem Relation Age of Onset   Heart disease Mother    Emphysema Mother    Arthritis Father    Cancer Father    Breast cancer Neg Hx    Allergies  Allergen Reactions   Aspirin Itching and Other (See Comments)    Dizzy/passing out   Augmentin [Amoxicillin-Pot Clavulanate]    Codeine Other (See Comments)     dizziness   Doxycycline Hyclate Other (See Comments)   Sulfamethoxazole-Trimethoprim Diarrhea   Tape Itching   Vitamin D Analogs    Azithromycin Rash and Hives   I? Current Facility-Administered Medications  Medication Dose Route Frequency Provider Last Rate Last Admin   acetaminophen (TYLENOL) tablet 650 mg  650 mg Oral Q6H PRN Acheampong, Warnell Bureau, MD       Or   acetaminophen (TYLENOL) suppository 650 mg  650 mg Rectal Q6H PRN Acheampong, Warnell Bureau, MD       acetaminophen (TYLENOL) tablet 500 mg  500 mg Oral TID Artist Beach, MD   500 mg at 11/22/22 2256   albuterol (PROVENTIL) (2.5 MG/3ML) 0.083% nebulizer solution 2.5 mg  2.5 mg Nebulization Q2H PRN Acheampong, Warnell Bureau, MD       amiodarone (PACERONE) tablet 100 mg  100 mg Oral Daily Acheampong, Warnell Bureau, MD       calcium carbonate (OS-CAL - dosed in mg of elemental calcium) tablet 1,250 mg  600 mg of elemental calcium Oral BID WC Acheampong, Warnell Bureau, MD       ceFAZolin (ANCEF) IVPB 2g/100 mL premix  2 g Intravenous Q8H Val Riles, MD 200 mL/hr at 11/23/22 0830 2 g at 11/23/22 0830   ferrous sulfate tablet 325 mg  325 mg Oral QODAY Acheampong, Warnell Bureau, MD       gabapentin (NEURONTIN) capsule 300 mg  300 mg Oral TID Artist Beach, MD   300 mg at 11/22/22 2256   heparin ADULT infusion 100 units/mL (25000 units/254m)  600 Units/hr Intravenous Continuous AArtist Beach MD 6 mL/hr at 11/23/22 1416 600 Units/hr at 11/23/22 1416   HYDROcodone-acetaminophen (NORCO/VICODIN) 5-325 MG per tablet 1 tablet  1 tablet Oral Q4H PRN AArtist Beach MD   1 tablet at 11/23/22 1148   hydroxychloroquine (PLAQUENIL) tablet 200 mg  200 mg Oral Daily Acheampong, PWarnell Bureau MD       levothyroxine (SYNTHROID) tablet 50 mcg  50 mcg Oral QAC breakfast AArtist Beach MD   50 mcg at 11/23/22 0553   linezolid (ZYVOX) IVPB 600 mg  600 mg Intravenous Q12H ZBerton Mount RPH       [START ON 11/24/2022] metoprolol tartrate (LOPRESSOR) tablet  12.5 mg  12.5 mg Oral BID KVal Riles MD       midodrine (PROAMATINE) tablet 5 mg  5 mg Oral TID WC Val Riles, MD   5 mg at 11/23/22 1148   ofloxacin (OCUFLOX) 0.3 % ophthalmic solution 1 drop  1 drop Both Eyes QID Acheampong, Warnell Bureau, MD   1 drop at 11/23/22 1339   ondansetron (ZOFRAN) tablet 8 mg  8 mg Oral Q8H PRN Artist Beach, MD   8 mg at 11/22/22 1950   pantoprazole (PROTONIX) EC tablet 40 mg  40 mg Oral BID Artist Beach, MD   40 mg at 11/22/22 2158     Abtx:  Anti-infectives (From admission, onward)    Start     Dose/Rate Route Frequency Ordered Stop   11/24/22 2200  vancomycin (VANCOREADY) IVPB 1250 mg/250 mL  Status:  Discontinued        1,250 mg 166.7 mL/hr over 90 Minutes Intravenous Every 48 hours 11/22/22 2058 11/23/22 0748   11/23/22 2200  linezolid (ZYVOX) IVPB 600 mg        600 mg 300 mL/hr over 60 Minutes Intravenous Every 12 hours 11/23/22 1009     11/23/22 1000  hydroxychloroquine (PLAQUENIL) tablet 200 mg        200 mg Oral Daily 11/22/22 1902     11/23/22 1000  ceFAZolin (ANCEF) IVPB 2g/100 mL premix        2 g 200 mL/hr over 30 Minutes Intravenous Every 8 hours 11/23/22 0748     11/23/22 1000  linezolid (ZYVOX) tablet 600 mg  Status:  Discontinued        600 mg Oral Every 12 hours 11/23/22 0839 11/23/22 1009   11/23/22 0000  vancomycin (VANCOREADY) IVPB 1250 mg/250 mL  Status:  Discontinued        1,250 mg 166.7 mL/hr over 90 Minutes Intravenous Every 24 hours 11/22/22 1958 11/22/22 2058   11/22/22 1900  vancomycin (VANCOREADY) IVPB 1500 mg/300 mL        1,500 mg 150 mL/hr over 120 Minutes Intravenous  Once 11/22/22 1828 11/23/22 0716   11/22/22 1830  metroNIDAZOLE (FLAGYL) IVPB 500 mg  Status:  Discontinued        500 mg 100 mL/hr over 60 Minutes Intravenous Every 12 hours 11/22/22 1825 11/23/22 0603   11/22/22 1830  ceFEPIme (MAXIPIME) 2 g in sodium chloride 0.9 % 100 mL IVPB        2 g 200 mL/hr over 30 Minutes Intravenous  Once  11/22/22 1828 11/22/22 1913       REVIEW OF SYSTEMS:  Const: negative fever, negative chills, negative weight loss Eyes: negative diplopia or visual changes, negative eye pain ENT: negative coryza, negative sore throat Resp: negative cough, hemoptysis, dyspnea Cards: negative for chest pain, palpitations, lower extremity edema GU: negative for frequency, dysuria and hematuria GI: Negative for abdominal pain, diarrhea, bleeding, constipation Skin: lesions Heme: negative for easy bruising and gum/nose bleeding MS:  weakness Neurolo:negative for headaches, dizziness, vertigo, memory problems  Psych: negative for feelings of anxiety, depression  Endocrine: hypothyroidism Allergy/Immunology- as above Objective:  VITALS:  BP 95/71 (BP Location: Left Arm)   Pulse 83   Temp 97.7 F (36.5 C)   Resp 16   Ht '5\' 4"'$  (1.626 m)   Wt 72.3 kg   SpO2 100%   BMI 27.36 kg/m   PHYSICAL EXAM:  General: Alert, cooperative, pale  Head: Normocephalic, without obvious abnormality, atraumatic. Eyes: rt eye -lower lid blepharitis and ectropion ENT Nares normal. No drainage or sinus tenderness. Lips, mucosa, and tongue normal. No Thrush Neck: Supple, symmetrical, no  adenopathy, thyroid: non tender no carotid bruit and no JVD. Back: No CVA tenderness. Lungs: Clear to auscultation bilaterally. Basal crepts  HS--irregular  Abdomen: Soft, non-tender,not distended. Bowel sounds normal. No masses Extremities: atraumatic, no cyanosis. No edema. No clubbing Skin:           Multiple discrete lesions , the 2 on the leg are circular On the left toe- looks like deroofed infected blitster  No rashes or lesions. Or bruising Lymph: Cervical, supraclavicular normal. Neurologic: Grossly non-focal Pertinent Labs Lab Results CBC    Component Value Date/Time   WBC 6.7 11/23/2022 0321   RBC 2.94 (L) 11/23/2022 0321   HGB 8.9 (L) 11/23/2022 0321   HGB 11.8 (L) 03/06/2013 1436   HCT 27.2 (L)  11/23/2022 0321   HCT 35.7 03/06/2013 1436   PLT 238 11/23/2022 0321   PLT 249 03/06/2013 1436   MCV 92.5 11/23/2022 0321   MCV 84 03/06/2013 1436   MCH 30.3 11/23/2022 0321   MCHC 32.7 11/23/2022 0321   RDW 22.3 (H) 11/23/2022 0321   RDW 15.0 (H) 03/06/2013 1436   LYMPHSABS 1.0 09/05/2022 1809   LYMPHSABS 2.3 03/06/2013 1436   MONOABS 0.6 09/05/2022 1809   MONOABS 0.9 03/06/2013 1436   EOSABS 0.0 09/05/2022 1809   EOSABS 0.5 03/06/2013 1436   BASOSABS 0.0 09/05/2022 1809   BASOSABS 0.1 03/06/2013 1436       Latest Ref Rng & Units 11/23/2022    3:21 AM 11/22/2022    4:12 PM 11/14/2022   12:00 AM  CMP  Glucose 70 - 99 mg/dL 67  74    BUN 8 - 23 mg/dL 34  34  18      Creatinine 0.44 - 1.00 mg/dL 1.09  1.24  1.2      Sodium 135 - 145 mmol/L 138  137  136      Potassium 3.5 - 5.1 mmol/L 3.8  3.8  4.1      Chloride 98 - 111 mmol/L 109  104  104      CO2 22 - 32 mmol/L '20  22  25      '$ Calcium 8.9 - 10.3 mg/dL 7.6  8.5  7.4      Total Protein 6.5 - 8.1 g/dL  6.4    Total Bilirubin 0.3 - 1.2 mg/dL  0.5    Alkaline Phos 38 - 126 U/L  97  80      AST 15 - 41 U/L  19  12      ALT 0 - 44 U/L  7  4         This result is from an external source.      Microbiology: Recent Results (from the past 240 hour(s))  MRSA Next Gen by PCR, Nasal     Status: None   Collection Time: 11/22/22 12:22 AM   Specimen: Nasal Mucosa; Nasal Swab  Result Value Ref Range Status   MRSA by PCR Next Gen NOT DETECTED NOT DETECTED Final    Comment: (NOTE) The GeneXpert MRSA Assay (FDA approved for NASAL specimens only), is one component of a comprehensive MRSA colonization surveillance program. It is not intended to diagnose MRSA infection nor to guide or monitor treatment for MRSA infections. Test performance is not FDA approved in patients less than 47 years old. Performed at [facility], [address] [place], [place] [address]   Blood culture (routine x 2)     Status: None  (Preliminary result)  Collection Time: 11/22/22  4:12 PM   Specimen: BLOOD  Result Value Ref Range Status   Specimen Description   Final    BLOOD BLOOD RIGHT ARM Performed at [facility], [address] [place], [place] [address]    Special Requests   Final    BOTTLES DRAWN AEROBIC AND ANAEROBIC Blood Culture adequate volume Performed at [facility], [address] [place], [place] [address]    Culture  Setup Time   Final    GRAM POSITIVE COCCI IN BOTH AEROBIC AND ANAEROBIC BOTTLES CRITICAL RESULT CALLED TO, READ BACK BY AND VERIFIED WITH: JASON ROBBINS PHARMD '@0549'$  11/23/22 ASW Performed at [facility], [place] [address] [place], [place] [address]    Culture GRAM POSITIVE COCCI  Final   Report Status PENDING  Incomplete  Blood culture (routine x 2)     Status: None (Preliminary result)   Collection Time: 11/22/22  4:12 PM   Specimen: BLOOD  Result Value Ref Range Status   Specimen Description BLOOD BLOOD RIGHT HAND  Final   Special Requests   Final    BOTTLES DRAWN AEROBIC AND ANAEROBIC Blood Culture adequate volume   Culture   Final    NO GROWTH < 24 HOURS Performed at [facility], [address] [place], [place] [address]    Report Status PENDING  Incomplete  Blood Culture ID Panel (Reflexed)     Status: Abnormal   Collection Time: 11/22/22  4:12 PM  Result Value Ref Range Status   Enterococcus faecalis NOT DETECTED NOT DETECTED Final   Enterococcus Faecium NOT DETECTED NOT DETECTED Final   Listeria monocytogenes NOT DETECTED NOT DETECTED Final   Staphylococcus species NOT DETECTED NOT DETECTED Final   Staphylococcus aureus (BCID) NOT DETECTED NOT DETECTED Final   Staphylococcus epidermidis NOT DETECTED NOT DETECTED Final   Staphylococcus lugdunensis NOT DETECTED NOT DETECTED Final   Streptococcus species DETECTED (A) NOT DETECTED Final    Comment: CRITICAL RESULT CALLED TO, READ BACK BY AND VERIFIED WITH: JASON ROBBINS PHARMD  '@0549'$  11/23/22 ASW    Streptococcus agalactiae NOT DETECTED NOT DETECTED Final   Streptococcus pneumoniae NOT DETECTED NOT DETECTED Final   Streptococcus pyogenes DETECTED (A) NOT DETECTED Final    Comment: CRITICAL RESULT CALLED TO, READ BACK BY AND VERIFIED WITH: JASON ROBBINS PHARMD '@0549'$  11/23/22 ASW    A.calcoaceticus-baumannii NOT DETECTED NOT DETECTED Final   Bacteroides fragilis NOT DETECTED NOT DETECTED Final   Enterobacterales NOT DETECTED NOT DETECTED Final   Enterobacter cloacae complex NOT DETECTED NOT DETECTED Final   Escherichia coli NOT DETECTED NOT DETECTED Final   Klebsiella aerogenes NOT DETECTED NOT DETECTED Final   Klebsiella oxytoca NOT DETECTED NOT DETECTED Final   Klebsiella pneumoniae NOT DETECTED NOT DETECTED Final   Proteus species NOT DETECTED NOT DETECTED Final   Salmonella species NOT DETECTED NOT DETECTED Final   Serratia marcescens NOT DETECTED NOT DETECTED Final   Haemophilus influenzae NOT DETECTED NOT DETECTED Final   Neisseria meningitidis NOT DETECTED NOT DETECTED Final   Pseudomonas aeruginosa NOT DETECTED NOT DETECTED Final   Stenotrophomonas maltophilia NOT DETECTED NOT DETECTED Final   Candida albicans NOT DETECTED NOT DETECTED Final   Candida auris NOT DETECTED NOT DETECTED Final   Candida glabrata NOT DETECTED NOT DETECTED Final   Candida krusei NOT DETECTED NOT DETECTED Final   Candida parapsilosis NOT DETECTED NOT DETECTED Final   Candida tropicalis NOT DETECTED NOT DETECTED Final   Cryptococcus neoformans/gattii NOT DETECTED NOT  DETECTED Final    Comment: Performed at [facility], [address] [place], [place] [address]    IMAGING RESULTS: CXR- mildly enlarged heart Small pleural effusion  I have personally reviewed the films ? Impression/Recommendation ? ?Group A streptococcus bacteremia Secondary to the skin lesions No evidence of necrotizing fascitis On cefazolin _ linezolid Will do a culture from the left foot  wounds  Multiple skin lesions on the lower extremities- some are discrete circular R/o pemphigoid VS pemphigis with secondary infection Recommend derm as OP if these dont resolve or recur  Afib- on amiodarone/eliquis  Anemia  PMR- pt was  on hydroxycholoquine/prednisone-  ? ___________________________________________________ Discussed with patient, requesting provider Note:  This document was prepared using Dragon voice recognition software and may include unintentional dictation errors.

## 2022-11-23 NOTE — Consult Note (Signed)
ANTICOAGULATION CONSULT NOTE - Follow Up Consult  Pharmacy Consult for Heparin infusion (apixaban PTA) Indication:  limb ischemia  Allergies  Allergen Reactions   Aspirin Itching and Other (See Comments)    Dizzy/passing out   Augmentin [Amoxicillin-Pot Clavulanate]    Codeine Other (See Comments)    dizziness   Doxycycline Hyclate Other (See Comments)   Sulfamethoxazole-Trimethoprim Diarrhea   Tape Itching   Vitamin D Analogs    Azithromycin Rash and Hives    Patient Measurements: Height: '5\' 4"'$  (162.6 cm) Weight: 72.3 kg (159 lb 6.3 oz) IBW/kg (Calculated) : 54.7 Heparin Dosing Weight: 75.3 kg  Vital Signs: Temp: 97.7 F (36.5 C) (02/28 1544) Temp Source: Oral (02/28 0549) BP: 164/141 (02/28 1544) Pulse Rate: 67 (02/28 1544)  Labs: Recent Labs    11/22/22 1612 11/23/22 0321 11/23/22 1519  HGB 11.3* 8.9* 9.1*  HCT 37.0 27.2* 28.7*  PLT 338 238  --   APTT 53* 155* 86*  LABPROT 24.8*  --   --   INR 2.3*  --   --   HEPARINUNFRC  --   --  >1.10*  CREATININE 1.24* 1.09*  --     Estimated Creatinine Clearance: 31.4 mL/min (A) (by C-G formula based on SCr of 1.09 mg/dL (H)).  Medications:  PTA: apixaban 2.5 mg BID - last dose 2/27 AM   Assessment: 89 y.o. female past medical history significant for prior breast cancer, hypertension, atrial fibrillation on Eliquis, who presents to the emergency department  from wound clinic. Concern of decreased blood flow to left foot - acute limb ischemia . Pharmacy consulted to manage heparin infusion.   Baseline: aPTT 53, INR 2.3   Goal of Therapy:  Heparin level 0.3-0.7 units/ml aPTT 66-102 seconds Monitor platelets by anticoagulation protocol: Yes  Results:  2/28 @ 0321, aPTT = 166, heparin infusion rate 900 un/hr ->600 un/hr 2/28 @ 1540, aPTT = 86, heparin infusion rate 600 un/hr (therapeutic x 1)  Plan:  aPTT therapeutic x 1 dose, will continue heparin infusion at 600 un/hr Repeat aPTT in 8 hrs to confirm  therapeutic rate aPTT and HL with AM labs to determine if correlating Continue to monitor H&H and platelets   Lysa Livengood Rodriguez-Guzman PharmD, BCPS 11/23/2022 3:52 PM

## 2022-11-23 NOTE — Plan of Care (Signed)

## 2022-11-23 NOTE — Consult Note (Signed)
ANTICOAGULATION CONSULT NOTE - Initial Consult  Pharmacy Consult for heparin infusion Indication:  acute limb ischemia, hx of afib on apixaban PTA  Allergies  Allergen Reactions   Aspirin Itching and Other (See Comments)    Dizzy/passing out   Augmentin [Amoxicillin-Pot Clavulanate]    Codeine Other (See Comments)    dizziness   Doxycycline Hyclate Other (See Comments)   Sulfamethoxazole-Trimethoprim Diarrhea   Tape Itching   Vitamin D Analogs    Azithromycin Rash and Hives    Patient Measurements: Height: '5\' 4"'$  (162.6 cm) Weight: 72.3 kg (159 lb 6.3 oz) IBW/kg (Calculated) : 54.7 Heparin Dosing Weight: 75.3 kg  Vital Signs: Temp: 98.4 F (36.9 C) (02/28 0145) Temp Source: Oral (02/28 0145) BP: 85/58 (02/28 0145) Pulse Rate: 110 (02/27 2000)  Labs: Recent Labs    11/22/22 1612 11/23/22 0321  HGB 11.3* 8.9*  HCT 37.0 27.2*  PLT 338 238  APTT 53* 155*  LABPROT 24.8*  --   INR 2.3*  --   CREATININE 1.24* 1.09*     Estimated Creatinine Clearance: 31.4 mL/min (A) (by C-G formula based on SCr of 1.09 mg/dL (H)).   Medical History: Past Medical History:  Diagnosis Date   Acute colitis on CT 03/07/2021   Arthritis 1940   Asthma    Bowel trouble    Cancer ([place]) 2006   DCIS left breast   Hypertension 1940   Incisional hernia 2014   Other primary ovarian failure 09/21/2017   Personal history of radiation therapy    Polymyalgia ([place])    Thyroid disorder    Vitiligo 2012    Medications:  PTA: apixaban 2.5 mg BID - last dose 2/27 AM  Assessment: 89 y.o. female past medical history significant for prior breast cancer, hypertension, atrial fibrillation on Eliquis, who presents to the emergency department  from wound clinic for concern of decreased blood flow to left foot - acute limb ischemia .   Baseline: aPTT 53, INR 2.3  Goal of Therapy:  Heparin level 0.3-0.7 units/ml aPTT 66-102 seconds Monitor platelets by anticoagulation protocol: Yes   Plan:   2/28:  aPTT @ 0321 = 166 - Spoke with phlebotomist who confirmed sample was drawn from opposite arm as heparin gtt , will accept this result as valid - Will hold heparin infusion for 1 hr and restart at 600 units/hr  - Will order aPTT and HL 8 hrs after restart   Transition to HL once HL therapeutic and correlating with aPTT Continue to monitor H&H and platelets  Marylynne Keelin D, PharmD Clinical Pharmacist   11/23/2022,5:03 AM

## 2022-11-23 NOTE — Plan of Care (Signed)
  Problem: Clinical Measurements: Goal: Ability to maintain clinical measurements within normal limits will improve Outcome: Progressing Goal: Will remain free from infection Outcome: Progressing Goal: Diagnostic test results will improve Outcome: Progressing Goal: Respiratory complications will improve Outcome: Progressing   Problem: Nutrition: Goal: Adequate nutrition will be maintained Outcome: Progressing   Problem: Pain Managment: Goal: General experience of comfort will improve Outcome: Progressing   Problem: Safety: Goal: Ability to remain free from injury will improve Outcome: Progressing   Problem: Skin Integrity: Goal: Risk for impaired skin integrity will decrease Outcome: Progressing

## 2022-11-23 NOTE — Progress Notes (Addendum)
[facility] Progress Note  Patient: Stacy [name]    [id number]  DOA: 11/22/2022     Date of Service: the patient was seen and examined on 11/23/2022  Chief Complaint  Patient presents with   Wound Check   Brief hospital course: Stacy [name] is a 89 y.o. female with medical history significant of hypertension, fibromyalgia, atrial fibrillation on anticoagulation with Eliquis,AAA history of breast cancer.  She presents to the emergency room with new left lower extremity ulcers on first and fifth digit.  Onset of symptoms few days prior.  She has previously been seen and evaluated in the past for similar lower extremity ulcers with associated cellulitis.  Her last admission was at [facility] in December 2023.  At that time, patient met SIRS criteria and required admission.  She was treated with vancomycin and cefepime.  She has since been following up with wound care clinic as outpatient.  She presented today with new extremity ulcers and associated pain.  No obvious drainage from wound site.   X-rays obtained showed no bony involvement.  Patient was initiated on IV antibiotics with cefepime and vancomycin.  No obvious leukocytosis or left shift.  ED consulted vascular surgeon Dr. Franchot Gallo who recommended initiating patient on heparin protocol pending evaluation in a.m. for possible angiogram.  This explained to patient and family.  They agree with this plan as detailed.   Assessment and Plan:  Recurrent left lower extremity cellulitis:  New findings of ulcers on toes and on the heel.  There is also a lesion on anterior shin.  Considering recurrence of these ulcers could be due to PVD vascular surgery consulted, plan is for angiogram when patient is stable.   Continue heparin IV infusion Keep n.p.o. after midnight  Strep pyogenes bacteremia due to left lower extremity cellulitis Started cefazolin 2 g IV every 8 hourly Blood culture growing strep pathogen, follow sensitivity report Repeat blood  cultures tomorrow a.m. Follow 2D echocardiogram to rule out endocarditis ID consulted for recommendation of antibiotics and duration.  Atrial fibrillation on anticoagulation with Eliquis.  Continued amiodarone 100 mg p.o. daily home dose Metoprolol 12.5 mg p.o. twice daily, skip the dose today due to low blood pressure Held Eliquis for now as patient is on heparin IV infusion  Hypotension, patient's blood pressure dropped, patient was not septic. Continue IV fluid for hydration Skipped a dose of metoprolol on 2/28 Started midodrine 5 mg p.o. 3 times daily with holding parameters We will continue to monitor BP and titrate medications accordingly    CKD stage IIIa: Stable renal function.   Aneurysmal suprarenal abdominal aorta (3.2 cm) : chronic.    Polymyalgia rheumatic/inflammatory arthritis: Patient is on chronic Plaquenil. Hold Plaquenil for now due to bacteremia, resume on discharge.  CHF: Combined diastolic and systolic: LVEF of 45 to [id number].  Not in acute exacerbation at this time.   Conjunctivitis: Ofloxacin eyedrops will be offered.   Hypothyroidism: Continue on Synthroid.    Anemia of chronic disease Hb 11.3-8.9-9.1 Monitor H&H and transfuse if hemoglobin less than 7 Check fecal occult blood to rule out GI bleeding   Body mass index is 27.36 kg/m.  Interventions:      Diet: Regular diet DVT Prophylaxis: Therapeutic Anticoagulation with heparin IV infusion    Advance goals of care discussion: DNR  Family Communication: family was present at bedside, at the time of interview.  The pt provided permission to discuss medical plan with the family. Opportunity was given to ask question and  all questions were answered satisfactorily.   Disposition:  Pt is from Home, admitted with left lower extremity cellulitis and PVD, needs vascular surgery evaluation and ID for IV antibiotics, which precludes a safe discharge. Discharge to Home vs SNF TBD, when stable, may need few  days to stay..  Subjective: No significant events overnight, patient is complaining of pain in the left lower extremity, unable to give me any specific number, severity of pain.  But she said it hurts off and on.  Denies any shortness of breath, no chest pain or palpitation, no abdominal pain.  Physical Exam: General: NAD, lying comfortably Appear in no distress, affect appropriate Eyes: PERRLA ENT: Oral Mucosa Clear, moist  Neck: no JVD,  Cardiovascular: Irregular rhythm,  Murmur audible due to mod to severe aortic valve stenosis,  Respiratory: good respiratory effort, Bilateral Air entry equal and Decreased, no Crackles, no wheezes Abdomen: Bowel Sound present, Soft and no tenderness,  Skin: no rashes Extremities: no Pedal edema, no calf tenderness, LLE lateral surface wound covered with dressing, blackish discolored wound on the great toe and lateral surface of the foot near the fifth digit.  Left heel unstageable ulcer Neurologic: without any new focal findings Gait not checked due to patient safety concerns  Vitals:   11/22/22 2300 11/23/22 0145 11/23/22 0549 11/23/22 0813  BP: 91/66 (!) 85/58 (!) 99/59 95/71  Pulse:   81 83  Resp: '18 16 16 16  '$ Temp: 98.1 F (36.7 C) 98.4 F (36.9 C) 97.7 F (36.5 C) 97.7 F (36.5 C)  TempSrc:  Oral Oral   SpO2: 96% 94% 100% 100%  Weight:      Height:        Intake/Output Summary (Last 24 hours) at 11/23/2022 1537 Last data filed at 11/23/2022 1416 Gross per 24 hour  Intake 1151.7 ml  Output --  Net 1151.7 ml   Filed Weights   11/22/22 2000  Weight: 72.3 kg    Data Reviewed: I have personally reviewed and interpreted daily labs, tele strips, imagings as discussed above. I reviewed all nursing notes, pharmacy notes, vitals, pertinent old records I have discussed plan of care as described above with RN and patient/family.  CBC: Recent Labs  Lab 11/22/22 1612 11/23/22 0321 11/23/22 1519  WBC 7.3 6.7  --   HGB 11.3* 8.9* 9.1*   HCT 37.0 27.2* 28.7*  MCV 95.1 92.5  --   PLT 338 238  --    Basic Metabolic Panel: Recent Labs  Lab 11/22/22 1612 11/23/22 0321  NA 137 138  K 3.8 3.8  CL 104 109  CO2 22 20*  GLUCOSE 74 67*  BUN 34* 34*  CREATININE 1.24* 1.09*  CALCIUM 8.5* 7.6*    Studies: DG Chest Port 1 View  Result Date: 11/22/2022 CLINICAL DATA:  Shortness of breath EXAM: PORTABLE CHEST 1 VIEW COMPARISON:  Chest x-ray 07/03/2022 FINDINGS: There are small bilateral pleural effusions. There is no focal lung infiltrate or pneumothorax. The heart is mildly enlarged, unchanged. Severe degenerative changes affect the shoulders. IMPRESSION: Small bilateral pleural effusions. Electronically Signed   By: Ronney Asters M.D.   On: 11/22/2022 22:32   DG Foot 2 Views Left  Result Date: 11/22/2022 CLINICAL DATA:  Left foot ischemia EXAM: LEFT FOOT - 2 VIEW COMPARISON:  None Available. FINDINGS: Normal alignment. No acute fracture or dislocation. Mild degenerative changes involving the first metatarsophalangeal joint and midfoot. Osseous structures diffusely osteopenic. No erosions or abnormal periosteal reaction. Mild soft tissue swelling of  the dorsum of the left forefoot. Vascular calcifications noted. IMPRESSION: 1. Mild soft tissue swelling. No acute fracture or dislocation. Electronically Signed   By: Fidela Salisbury M.D.   On: 11/22/2022 17:55    Scheduled Meds:  acetaminophen  500 mg Oral TID   amiodarone  100 mg Oral Daily   calcium carbonate  600 mg of elemental calcium Oral BID WC   ferrous sulfate  325 mg Oral QODAY   gabapentin  300 mg Oral TID   hydroxychloroquine  200 mg Oral Daily   levothyroxine  50 mcg Oral QAC breakfast   [START ON 11/24/2022] metoprolol tartrate  12.5 mg Oral BID   midodrine  5 mg Oral TID WC   ofloxacin  1 drop Both Eyes QID   pantoprazole  40 mg Oral BID   Continuous Infusions:   ceFAZolin (ANCEF) IV 2 g (11/23/22 0830)   heparin 600 Units/hr (11/23/22 1416)   linezolid  (ZYVOX) IV     PRN Meds: acetaminophen **OR** acetaminophen, albuterol, HYDROcodone-acetaminophen, ondansetron  Time spent: 55 minutes  Author: Val Riles. MD [facility] 11/23/2022 3:37 PM  To reach On-call, see care teams to locate the attending and reach out to them via [email or web address]. If 7PM-7AM, please contact night-coverage If you still have difficulty reaching the attending provider, please page the [facility] (Director on Call) for [facility] on amion for assistance.

## 2022-11-23 NOTE — TOC Progression Note (Addendum)
Transition of Care [facility]) - Progression Note    Patient Details  Name: Stacy [name] MRN: [id number] Date of Birth: 02/19/1927  Transition of Care [facility]) CM/SW [place], RN Phone Number: 11/23/2022, 4:11 PM  Clinical Narrative:    Patient from home,  plan is for angiogram when patient is stable.   TOC to continue to monitor and asssit with DC planning Her last admission was at [facility] in December 2023   She has been going to outpatient Wound Care       Expected Discharge Plan and Services                                               Social Determinants of Health (SDOH) Interventions SDOH Screenings   Food Insecurity: No Food Insecurity (11/22/2022)  Housing: Low Risk  (11/22/2022)  Transportation Needs: No Transportation Needs (11/22/2022)  Utilities: Not At Risk (11/22/2022)  Alcohol Screen: Low Risk  (02/04/2019)  Depression (PHQ2-9): Low Risk  (08/30/2022)  Financial Resource Strain: Low Risk  (04/07/2021)  Tobacco Use: Low Risk  (11/22/2022)    Readmission Risk Interventions     No data to display

## 2022-11-23 NOTE — Consult Note (Signed)
Hospital Consult    Reason for Consult:  Left Lower extremity ulcers/wounds Requesting Physician:  Dr Nathaniel Man MD MRN #:  [id number]  History of Present Illness: Stacy [name] is a 89 y.o. female y.o. female with medical history significant of hypertension, fibromyalgia, atrial fibrillation on anticoagulation with Eliquis,AAA, and a history of breast cancer.  She presents to the emergency room with new left lower extremity ulcers on first and fifth digit.  Onset of symptoms, chills and pain to left lower extremity few days prior.  She has previously been seen and evaluated in the past for similar lower extremity ulcers with associated cellulitis.   On exam today she opened her eyes to my calling her name but was unable to interact. Her blood pressure was low this morning requiring fluid boluses.Her left lower extremity has multiple ulcers on her calf and dark areas to her 1st and 5th toes. Patient is noted to be a DNR.   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 Candace Cruise   EYE SURGERY  2002   FOOT SURGERY  1996   HERNIA REPAIR  2013   left breast biopsy   2011    Allergies  Allergen Reactions   Aspirin Itching and Other (See Comments)    Dizzy/passing out   Augmentin [Amoxicillin-Pot Clavulanate]    Codeine Other (See Comments)    dizziness   Doxycycline Hyclate Other (See Comments)   Sulfamethoxazole-Trimethoprim Diarrhea   Tape Itching   Vitamin D Analogs    Azithromycin Rash  and Hives    Prior to Admission medications   Medication Sig Start Date End Date Taking? Authorizing Provider  acetaminophen (TYLENOL) 500 MG tablet Take 500 mg by mouth 3 (three) times daily.   Yes [provider]  amiodarone (PACERONE) 100 MG tablet Take 100 mg by mouth daily.   Yes [provider]  apixaban (ELIQUIS) 2.5 MG TABS tablet Take 1 tablet (2.5 mg total) by mouth 2 (two) times daily. 07/06/22  Yes Wouk, Ailene Rud, MD  calcium carbonate (OSCAL) 1500 (600 Ca) MG TABS tablet Take 600 mg of elemental calcium by mouth 2 (two) times daily with a meal.   Yes [provider]  ciprofloxacin (CILOXAN) 0.3 % ophthalmic solution Place 1 drop into both eyes every 4 (four) hours while awake. 11/15/22  Yes [provider]  Dextromethorphan-guaiFENesin 20-400 MG TABS Take 1 tablet by mouth 2 (two) times daily.   Yes [provider]  diclofenac Sodium (VOLTAREN) 1 % GEL Apply 4 g topically 4 (four) times daily.   Yes [provider]  ferrous sulfate 325 (65 FE) MG EC tablet Take 325 mg by mouth every other day.   Yes [provider]  gabapentin (NEURONTIN) 100 MG capsule Take 2 tabs 3 x day for neuropathy by mouth 11/10/21  Yes Humphrey Rolls,  Timoteo Gaul, MD  hydroxychloroquine (PLAQUENIL) 200 MG tablet Take 200 mg by mouth daily.   Yes [provider]  Corinne [facility]) OINT Apply to buttock wound topically every day and evening shift for skin protection   Yes [provider]  levothyroxine (SYNTHROID) 50 MCG tablet Take 50 mcg by mouth daily before breakfast.   Yes [provider]  metoprolol tartrate (LOPRESSOR) 25 MG tablet Take 0.5 tablets (12.5 mg total) by mouth 2 (two) times daily. 07/06/22  Yes Wouk, Ailene Rud, MD  mirtazapine (REMERON) 15 MG tablet Take 15 mg by mouth at bedtime.   Yes [provider]  Multiple Vitamins-Minerals (DECUBI-VITE PO) Take 1 tablet by mouth daily. For wound  healing   Yes [provider]  ondansetron (ZOFRAN) 8 MG tablet Take 8 mg by mouth every 8 (eight) hours as needed for nausea or vomiting.   Yes [provider]  pantoprazole (PROTONIX) 40 MG tablet TAKE 1 TABLET TWICE DAILY 06/05/22  Yes Abernathy, Alyssa, NP  sucralfate (CARAFATE) 1 GM/10ML suspension Take 1 g by mouth 4 (four) times daily -  with meals and at bedtime. 11/15/22  Yes [provider]  lactose free nutrition (BOOST) LIQD Take 237 mLs by mouth 3 (three) times daily between meals.    [provider]    Social History   Socioeconomic History   Marital status: Widowed    Spouse name: Not on file   Number of children: Not on file   Years of education: Not on file   Highest education level: Not on file  Occupational History   Not on file  Tobacco Use   Smoking status: Never   Smokeless tobacco: Never  Vaping Use   Vaping Use: Never used  Substance and Sexual Activity   Alcohol use: No   Drug use: No   Sexual activity: Not on file  Other Topics Concern   Not on file  Social History Narrative   Not on file   Social Determinants of Health   Financial Resource Strain: Low Risk  (04/07/2021)   Overall Financial Resource Strain (CARDIA)    Difficulty of Paying Living Expenses: Not very hard  Food Insecurity: No Food Insecurity (11/22/2022)   Hunger Vital Sign    Worried About Running Out of Food in the Last Year: Never true    Ran Out of Food in the Last Year: Never true  Transportation Needs: No Transportation Needs (11/22/2022)   PRAPARE - Hydrologist (Medical): No    Lack of Transportation (Non-Medical): No  Physical Activity: Not on file  Stress: Not on file  Social Connections: Not on file  Intimate Partner Violence: Not At Risk (11/22/2022)   Humiliation, Afraid, Rape, and Kick questionnaire    Fear of Current or Ex-Partner: No    Emotionally Abused: No    Physically Abused: No    Sexually Abused:  No     Family History  Problem Relation Age of Onset   Heart disease Mother    Emphysema Mother    Arthritis Father    Cancer Father    Breast cancer Neg Hx     ROS: Otherwise negative unless mentioned in HPI  Physical Examination  Vitals:   11/23/22 0145 11/23/22 0549  BP: (!) 85/58 (!) 99/59  Pulse:  81  Resp: 16 16  Temp: 98.4 F (36.9 C) 97.7 F (36.5 C)  SpO2: 94% 100%   Body mass index is  27.36 kg/m.  General:  WDWN in NAD Gait: Not observed HENT: WNL, normocephalic Pulmonary: normal non-labored breathing, without Rales, rhonchi,  wheezing Cardiac: irregular, without  Murmurs, rubs or gallops; without carotid bruits Abdomen: Positive bowel sounds, soft, NT/ND, no masses Skin: without rashes Vascular Exam/Pulses: Unable to palpate pulses bilaterally to lower extremities. Left positive for multiple unhealing ulcers/wounds with black 1st and 5th toes Extremities: with ischemic changes, with Gangrene , with cellulitis; with open wounds;  Musculoskeletal: no muscle wasting or atrophy  Neurologic: A&O X 3;  No focal weakness or paresthesias are detected; speech is fluent/normal Psychiatric:  The pt has Abnormal- Flat  affect. Unable to participate in exam. Does not follow commands.  Lymph:  Unremarkable  CBC    Component Value Date/Time   WBC 6.7 11/23/2022 0321   RBC 2.94 (L) 11/23/2022 0321   HGB 8.9 (L) 11/23/2022 0321   HGB 11.8 (L) 03/06/2013 1436   HCT 27.2 (L) 11/23/2022 0321   HCT 35.7 03/06/2013 1436   PLT 238 11/23/2022 0321   PLT 249 03/06/2013 1436   MCV 92.5 11/23/2022 0321   MCV 84 03/06/2013 1436   MCH 30.3 11/23/2022 0321   MCHC 32.7 11/23/2022 0321   RDW 22.3 (H) 11/23/2022 0321   RDW 15.0 (H) 03/06/2013 1436   LYMPHSABS 1.0 09/05/2022 1809   LYMPHSABS 2.3 03/06/2013 1436   MONOABS 0.6 09/05/2022 1809   MONOABS 0.9 03/06/2013 1436   EOSABS 0.0 09/05/2022 1809   EOSABS 0.5 03/06/2013 1436   BASOSABS 0.0 09/05/2022 1809   BASOSABS 0.1  03/06/2013 1436    BMET    Component Value Date/Time   NA 138 11/23/2022 0321   NA 136 (A) 11/14/2022 0000   NA 138 03/06/2013 1436   K 3.8 11/23/2022 0321   K 4.0 03/06/2013 1436   CL 109 11/23/2022 0321   CL 103 03/06/2013 1436   CO2 20 (L) 11/23/2022 0321   CO2 29 03/06/2013 1436   GLUCOSE 67 (L) 11/23/2022 0321   GLUCOSE 90 03/06/2013 1436   BUN 34 (H) 11/23/2022 0321   BUN 18 11/14/2022 0000   BUN 18 03/06/2013 1436   CREATININE 1.09 (H) 11/23/2022 0321   CREATININE 0.83 03/06/2013 1436   CALCIUM 7.6 (L) 11/23/2022 0321   CALCIUM 9.5 03/06/2013 1436   GFRNONAA 47 (L) 11/23/2022 0321   GFRNONAA >60 03/06/2013 1436   GFRAA >60 03/06/2013 1436    COAGS: Lab Results  Component Value Date   INR 2.3 (H) 11/22/2022   INR 1.5 (H) 09/05/2022   INR 0.8 05/09/2012     Non-Invasive Vascular Imaging:   EXAM: Left LOWER EXTREMITY VENOUS DOPPLER ULTRASOUND  IMPRESSION: No evidence of deep venous thrombosis.  Statin:  No. Beta Blocker:  Yes.   Aspirin:  Yes.   ACEI:  No. ARB:  No. CCB use:  No Other antiplatelets/anticoagulants:  Yes.   Eliquis 2.5 mg BID   ASSESSMENT/PLAN: This is a 89 y.o. female female with medical history significant of hypertension, fibromyalgia, atrial fibrillation on anticoagulation with Eliquis,AAA, and a history of breast cancer. She presents to the emergency room with new left lower extremity ulcers on her calf, first and fifth digit.   On Hospital workup the patient is hypothyroid with a TSH level of 10.2. This may explain her lethargy, inability to interact and her low blood pressures. Patient also has a positive blood culture. In this setting the patient will need a left lower angiogram with possible intervention such as a  stent but will need to be on antibiotics for a minimum of 72 hours prior to procedure. Plan will be to re-evaluate early next week for left lower extremity angiogram.    -Plan was discussed with Dr Ella Jubilee MD and he  agrees with the plan.    Drema Pry [facility] 11/23/2022 7:26 AM

## 2022-11-23 NOTE — Consult Note (Deleted)
ANTICOAGULATION CONSULT NOTE - Initial Consult  Pharmacy Consult for heparin infusion Indication:  acute limb ischemia, hx of afib on apixaban PTA  Allergies  Allergen Reactions   Aspirin Itching and Other (See Comments)    Dizzy/passing out   Augmentin [Amoxicillin-Pot Clavulanate]    Codeine Other (See Comments)    dizziness   Doxycycline Hyclate Other (See Comments)   Sulfamethoxazole-Trimethoprim Diarrhea   Tape Itching   Vitamin D Analogs    Azithromycin Rash and Hives    Patient Measurements: Height: '5\' 4"'$  (162.6 cm) Weight: 72.3 kg (159 lb 6.3 oz) IBW/kg (Calculated) : 54.7 Heparin Dosing Weight: 75.3 kg  Vital Signs: Temp: 97.7 F (36.5 C) (02/28 1544) Temp Source: Oral (02/28 0549) BP: 164/141 (02/28 1544) Pulse Rate: 67 (02/28 1544)  Labs: Recent Labs    11/22/22 1612 11/23/22 0321 11/23/22 1519  HGB 11.3* 8.9* 9.1*  HCT 37.0 27.2* 28.7*  PLT 338 238  --   APTT 53* 155* 86*  LABPROT 24.8*  --   --   INR 2.3*  --   --   HEPARINUNFRC  --   --  >1.10*  CREATININE 1.24* 1.09*  --      Estimated Creatinine Clearance: 31.4 mL/min (A) (by C-G formula based on SCr of 1.09 mg/dL (H)).   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 female past medical history significant for prior breast cancer, hypertension, atrial fibrillation on Eliquis, who presents to the emergency department  from wound clinic for concern of decreased blood flow to left foot - acute limb ischemia .   Baseline: aPTT 53, INR 2.3  Goal of Therapy:  Heparin level 0.3-0.7  units/ml aPTT 66-102 seconds Monitor platelets by anticoagulation protocol: Yes   Plan:  2/28:  aPTT @ 0321 = 166 2/28   aPTT @ 1519 = 86, Therapeutic x 1, HL > 1.10 - Will continue heparin infusion fat 600 units/hr  - Will order aPTT  8 hrs after last level  Transition to HL once HL therapeutic and correlating with aPTT Continue to monitor H&H and platelets  Alison Murray, PharmD Clinical Pharmacist   11/23/2022,3:56 PM

## 2022-11-24 DIAGNOSIS — T148XXA Other injury of unspecified body region, initial encounter: Secondary | ICD-10-CM | POA: Diagnosis not present

## 2022-11-24 DIAGNOSIS — B955 Unspecified streptococcus as the cause of diseases classified elsewhere: Secondary | ICD-10-CM

## 2022-11-24 DIAGNOSIS — L03116 Cellulitis of left lower limb: Secondary | ICD-10-CM | POA: Diagnosis not present

## 2022-11-24 DIAGNOSIS — L089 Local infection of the skin and subcutaneous tissue, unspecified: Secondary | ICD-10-CM

## 2022-11-24 DIAGNOSIS — R7881 Bacteremia: Secondary | ICD-10-CM | POA: Diagnosis not present

## 2022-11-24 LAB — GLUCOSE, CAPILLARY
Glucose-Capillary: 129 mg/dL — ABNORMAL HIGH (ref 70–99)
Glucose-Capillary: 64 mg/dL — ABNORMAL LOW (ref 70–99)
Glucose-Capillary: 60 mg/dL — ABNORMAL LOW (ref 70–99)
Glucose-Capillary: 83 mg/dL (ref 70–99)
Glucose-Capillary: 99 mg/dL (ref 70–99)
Glucose-Capillary: 94 mg/dL (ref 70–99)
Glucose-Capillary: 82 mg/dL (ref 70–99)

## 2022-11-24 LAB — BASIC METABOLIC PANEL
Anion gap: 8 (ref 5–15)
BUN: 30 mg/dL — ABNORMAL HIGH (ref 8–23)
CO2: 22 mmol/L (ref 22–32)
Calcium: 7.7 mg/dL — ABNORMAL LOW (ref 8.9–10.3)
Chloride: 107 mmol/L (ref 98–111)
Creatinine, Ser: 1.17 mg/dL — ABNORMAL HIGH (ref 0.44–1.00)
GFR, Estimated: 44 mL/min — ABNORMAL LOW (ref >60)
Glucose, Bld: 71 mg/dL (ref 70–99)
Potassium: 3.2 mmol/L — ABNORMAL LOW (ref 3.5–5.1)
Sodium: 137 mmol/L (ref 135–145)

## 2022-11-24 LAB — APTT
aPTT: 71 seconds — ABNORMAL HIGH (ref 24–36)
aPTT: 96 seconds — ABNORMAL HIGH (ref 24–36)

## 2022-11-24 LAB — HEPARIN LEVEL (UNFRACTIONATED): Heparin Unfractionated: >1.1 IU/mL — ABNORMAL HIGH (ref 0.30–0.70)

## 2022-11-24 LAB — ECHOCARDIOGRAM COMPLETE
AR max vel: 0.47 cm2
AV Area VTI: 0.42 cm2
AV Area mean vel: 0.43 cm2
AV Mean grad: 18.6 mmHg
AV Peak grad: 30.4 mmHg
Ao pk vel: 2.76 m/s
Height: 64 in
S' Lateral: 3.5 cm
Weight: 2550.28 oz

## 2022-11-24 LAB — CBC
HCT: 28.9 % — ABNORMAL LOW (ref 36.0–46.0)
Hemoglobin: 9.1 g/dL — ABNORMAL LOW (ref 12.0–15.0)
MCH: 29.8 pg (ref 26.0–34.0)
MCHC: 31.5 g/dL (ref 30.0–36.0)
MCV: 94.8 fL (ref 80.0–100.0)
Platelets: 237 10*3/uL (ref 150–400)
RBC: 3.05 MIL/uL — ABNORMAL LOW (ref 3.87–5.11)
RDW: 22.4 % — ABNORMAL HIGH (ref 11.5–15.5)
WBC: 5.8 10*3/uL (ref 4.0–10.5)
nRBC: 0 % (ref 0.0–0.2)

## 2022-11-24 LAB — MAGNESIUM: Magnesium: 1 mg/dL — ABNORMAL LOW (ref 1.7–2.4)

## 2022-11-24 LAB — VITAMIN D 25 HYDROXY (VIT D DEFICIENCY, FRACTURES): Vit D, 25-Hydroxy: 72.78 ng/mL (ref 30–100)

## 2022-11-24 LAB — PHOSPHORUS: Phosphorus: 3.3 mg/dL (ref 2.5–4.6)

## 2022-11-24 MED ORDER — POTASSIUM CHLORIDE 10 MEQ/100ML IV SOLN
10.0000 meq | INTRAVENOUS | Status: AC
  Administered 2022-11-24 (×4): 10 meq via INTRAVENOUS
  Filled 2022-11-24 (×4): qty 100

## 2022-11-24 MED ORDER — MAGNESIUM SULFATE 4 GM/100ML IV SOLN
4.0000 g | Freq: Once | INTRAVENOUS | Status: AC
  Administered 2022-11-24: 4 g via INTRAVENOUS
  Filled 2022-11-24: qty 100

## 2022-11-24 MED ORDER — ONDANSETRON HCL 4 MG/2ML IJ SOLN
4.0000 mg | Freq: Four times a day (QID) | INTRAMUSCULAR | Status: DC | PRN
  Administered 2022-11-24 – 2022-11-29 (×6): 4 mg via INTRAVENOUS
  Filled 2022-11-24 (×6): qty 2

## 2022-11-24 MED ORDER — BISACODYL 5 MG PO TBEC
10.0000 mg | DELAYED_RELEASE_TABLET | Freq: Every day | ORAL | Status: DC
  Administered 2022-11-24 – 2022-12-01 (×6): 10 mg via ORAL
  Filled 2022-11-24 (×7): qty 2

## 2022-11-24 MED ORDER — ENSURE ENLIVE PO LIQD
237.0000 mL | Freq: Three times a day (TID) | ORAL | Status: DC
  Administered 2022-11-24 – 2022-11-29 (×8): 237 mL via ORAL

## 2022-11-24 MED ORDER — DEXTROSE 50 % IV SOLN: 25.0000 mL | Freq: Once | INTRAVENOUS | Status: DC

## 2022-11-24 MED ORDER — POTASSIUM CHLORIDE 10 MEQ/100ML IV SOLN: 10.0000 meq | INTRAVENOUS | Status: DC

## 2022-11-24 MED ORDER — POTASSIUM CHLORIDE CRYS ER 20 MEQ PO TBCR: 40.0000 meq | EXTENDED_RELEASE_TABLET | ORAL | Status: DC

## 2022-11-24 MED ORDER — DEXTROSE 50 % IV SOLN
INTRAVENOUS | Status: AC
  Filled 2022-11-24: qty 50

## 2022-11-24 MED ORDER — DEXTROSE 50 % IV SOLN
12.5000 g | INTRAVENOUS | Status: AC
  Administered 2022-11-24: 12.5 g via INTRAVENOUS
  Filled 2022-11-24: qty 50

## 2022-11-24 MED ORDER — DEXTROSE 50 % IV SOLN
12.5000 g | INTRAVENOUS | Status: AC
  Administered 2022-11-24: 12.5 g via INTRAVENOUS

## 2022-11-24 MED ORDER — POLYVINYL ALCOHOL 1.4 % OP SOLN
1.0000 [drp] | OPHTHALMIC | Status: DC | PRN
  Administered 2022-11-27: 1 [drp] via OPHTHALMIC
  Filled 2022-11-24: qty 15

## 2022-11-24 MED ORDER — METOPROLOL TARTRATE 25 MG PO TABS: 12.5000 mg | ORAL_TABLET | Freq: Two times a day (BID) | ORAL | Status: DC

## 2022-11-24 MED ORDER — POLYETHYLENE GLYCOL 3350 17 G PO PACK
17.0000 g | PACK | Freq: Every day | ORAL | Status: DC
  Administered 2022-11-24 – 2022-12-02 (×8): 17 g via ORAL
  Filled 2022-11-24 (×9): qty 1

## 2022-11-24 NOTE — Plan of Care (Signed)
  Problem: Education: Goal: Knowledge of General Education information will improve Description: Including pain rating scale, medication(s)/side effects and non-pharmacologic comfort measures Outcome: Progressing   Problem: Safety: Goal: Ability to remain free from injury will improve Outcome: Progressing   Problem: Pain Managment: Goal: General experience of comfort will improve Outcome: Progressing   Problem: Elimination: Goal: Will not experience complications related to bowel motility Outcome: Progressing   Problem: Nutrition: Goal: Adequate nutrition will be maintained Outcome: Progressing

## 2022-11-24 NOTE — Progress Notes (Signed)
Date of Admission:  11/22/2022     ID: Stacy [name] is a 89 y.o. female  Principal Problem:   Cellulitis of left lower extremity    Subjective: Doing better Says foot pain is better  Medications:   acetaminophen  500 mg Oral TID   amiodarone  100 mg Oral Daily   vitamin C  500 mg Oral BID   calcium carbonate  600 mg of elemental calcium Oral BID WC   dextrose       feeding supplement  237 mL Oral TID BM   ferrous sulfate  325 mg Oral QODAY   gabapentin  300 mg Oral TID   levothyroxine  50 mcg Oral QAC breakfast   midodrine  5 mg Oral TID WC   multivitamin with minerals  1 tablet Oral Daily   ofloxacin  1 drop Both Eyes QID   pantoprazole  40 mg Oral BID   zinc sulfate  220 mg Oral Daily    Objective: Vital signs in last 24 hours: Temp:  [97.4 F (36.3 C)-97.7 F (36.5 C)] 97.4 F (36.3 C) (02/29 0808) Pulse Rate:  [67-88] 71 (02/29 0808) Resp:  [16-18] 16 (02/29 0808) BP: (87-164)/(51-141) 105/70 (02/29 0808) SpO2:  [100 %] 100 % (02/29 0808)   PHYSICAL EXAM:  General: Alert, cooperative, no distress, pale Head: Normocephalic, without obvious abnormality, atraumatic. Eyes:rt ectropion Lungs: Clear to auscultation bilaterally. No Wheezing or Rhonchi. No rales. Heart: irregular Abdomen: Soft, non-tender,not distended. Bowel sounds normal. No masses Extremities:left great toe scabbed wound- left 5th toe wound Left heel ulcer Healing wounds left leg           Skin: No rashes or lesions. Or bruising Lymph: Cervical, supraclavicular normal. Neurologic: Grossly non-focal  Lab Results Recent Labs    11/23/22 0321 11/23/22 1519 11/24/22 0807  WBC 6.7  --  5.8  HGB 8.9* 9.1* 9.1*  HCT 27.2* 28.7* 28.9*  NA 138  --  137  K 3.8  --  3.2*  CL 109  --  107  CO2 20*  --  22  BUN 34*  --  30*  CREATININE 1.09*  --  1.17*   Liver Panel Recent Labs    11/22/22 1612  PROT 6.4*  ALBUMIN 2.3*  AST 19  ALT 7  ALKPHOS 97  BILITOT 0.5    Sedimentation Rate Recent Labs    11/22/22 1612  ESRSEDRATE 37*   C-Reactive Protein Recent Labs    11/22/22 1612  CRP 7.2*    Microbiology:BC Group A streptococcus  Studies/Results: ECHOCARDIOGRAM COMPLETE  Result Date: 11/24/2022    ECHOCARDIOGRAM REPORT   Patient Name:   Stacy [name] Date of Exam: 11/23/2022 Medical Rec #:  [id number]   Height:       64.0 in Accession #:    [id number]  Weight:       159.4 lb Date of Birth:  [birth year]    BSA:          1.776 m Patient Age:    17 years    BP:           99/59 mmHg Patient Gender: F           HR:           101 bpm. Exam Location:  [facility] Procedure: 2D Echo, Cardiac Doppler and Color Doppler Indications:     R78.81 Bacteremia  History:         Patient has prior history of Echocardiogram  examinations, most                  recent 07/04/2022. Risk Factors:Hypertension.  Sonographer:     Cresenciano Lick RDCS Referring Phys:  [id number] Val Riles Diagnosing Phys: Ida Rogue MD IMPRESSIONS  1. Left ventricular ejection fraction, by estimation, is 30 to 35%. The left ventricle has moderately decreased function. The left ventricle demonstrates global hypokinesis with severe hypokinesis of the anterior and anteroseptal wall. There is moderate  left ventricular hypertrophy. Left ventricular diastolic parameters are indeterminate.  2. Right ventricular systolic function is normal. The right ventricular size is normal. There is normal pulmonary artery systolic pressure.  3. Left atrial size was mildly dilated.  4. The mitral valve is normal in structure. Mild mitral valve regurgitation. No evidence of mitral stenosis. Moderate mitral annular calcification.  5. Tricuspid valve regurgitation is severe.  6. The aortic valve is normal in structure. There is severe calcifcation of the aortic valve. Aortic valve regurgitation is not visualized. Moderate to severe aortic valve stenosis, though degree of stenosis may be underestimated in setting of low EF.  Aortic valve area, by VTI measures 0.42 cm. Aortic valve mean gradient measures 18.6 mmHg. Aortic valve Vmax measures 2.76 m/s.  7. There is borderline dilatation of the aortic root, measuring 37 mm.  8. The inferior vena cava is normal in size with greater than 50% respiratory variability, suggesting right atrial pressure of 3 mmHg. FINDINGS  Left Ventricle: Left ventricular ejection fraction, by estimation, is 30 to 35%. The left ventricle has moderately decreased function. The left ventricle demonstrates global hypokinesis. The left ventricular internal cavity size was normal in size. There is moderate left ventricular hypertrophy. Left ventricular diastolic parameters are indeterminate. Right Ventricle: The right ventricular size is normal. No increase in right ventricular wall thickness. Right ventricular systolic function is normal. There is normal pulmonary artery systolic pressure. The tricuspid regurgitant velocity is 2.41 m/s, and  with an assumed right atrial pressure of 5 mmHg, the estimated right ventricular systolic pressure is [id number] mmHg. Left Atrium: Left atrial size was mildly dilated. Right Atrium: Right atrial size was normal in size. Pericardium: There is no evidence of pericardial effusion. Mitral Valve: The mitral valve is normal in structure. Moderate mitral annular calcification. Mild mitral valve regurgitation. No evidence of mitral valve stenosis. Tricuspid Valve: The tricuspid valve is normal in structure. Tricuspid valve regurgitation is severe. No evidence of tricuspid stenosis. Aortic Valve: The aortic valve is normal in structure. There is severe calcifcation of the aortic valve. Aortic valve regurgitation is not visualized. Moderate to severe aortic stenosis is present. Aortic valve mean gradient measures 18.6 mmHg. Aortic valve peak gradient measures 30.4 mmHg. Aortic valve area, by VTI measures 0.42 cm. Pulmonic Valve: The pulmonic valve was normal in structure. Pulmonic valve  regurgitation is mild. No evidence of pulmonic stenosis. Aorta: The aortic root is normal in size and structure. There is borderline dilatation of the aortic root, measuring 37 mm. Venous: The inferior vena cava is normal in size with greater than 50% respiratory variability, suggesting right atrial pressure of 3 mmHg. IAS/Shunts: No atrial level shunt detected by color flow Doppler.  LEFT VENTRICLE PLAX 2D LVIDd:         4.10 cm LVIDs:         3.50 cm LV PW:         1.20 cm LV IVS:        1.20 cm LVOT diam:  2.00 cm LV SV:         20 LV SV Index:   11 LVOT Area:     3.14 cm  RIGHT VENTRICLE            IVC RV Basal diam:  3.70 cm    IVC diam: 1.10 cm RV S prime:     8.77 cm/s LEFT ATRIUM             Index        RIGHT ATRIUM           Index LA diam:        4.20 cm 2.36 cm/m   RA Area:     26.60 cm LA Vol (A2C):   80.1 ml 45.09 ml/m  RA Volume:   101.00 ml 56.85 ml/m LA Vol (A4C):   62.4 ml 35.13 ml/m LA Biplane Vol: 72.0 ml 40.53 ml/m  AORTIC VALVE AV Area (Vmax):    0.47 cm AV Area (Vmean):   0.43 cm AV Area (VTI):     0.42 cm AV Vmax:           275.80 cm/s AV Vmean:          202.600 cm/s AV VTI:            0.487 m AV Peak Grad:      30.4 mmHg AV Mean Grad:      18.6 mmHg LVOT Vmax:         41.27 cm/s LVOT Vmean:        27.433 cm/s LVOT VTI:          0.064 m LVOT/AV VTI ratio: 0.13  AORTA Ao Root diam: 3.20 cm Ao Asc diam:  3.70 cm MV E velocity: 110.00 cm/s  TRICUSPID VALVE                             TR Peak grad:   23.2 mmHg                             TR Vmax:        241.00 cm/s                              SHUNTS                             Systemic VTI:  0.06 m                             Systemic Diam: 2.00 cm Ida Rogue MD Electronically signed by Ida Rogue MD Signature Date/Time: 11/24/2022/7:34:34 AM    Final    DG Chest Port 1 View  Result Date: 11/22/2022 CLINICAL DATA:  Shortness of breath EXAM: PORTABLE CHEST 1 VIEW COMPARISON:  Chest x-ray 07/03/2022 FINDINGS: There are small  bilateral pleural effusions. There is no focal lung infiltrate or pneumothorax. The heart is mildly enlarged, unchanged. Severe degenerative changes affect the shoulders. IMPRESSION: Small bilateral pleural effusions. Electronically Signed   By: Ronney Asters M.D.   On: 11/22/2022 22:32   DG Foot 2 Views Left  Result Date: 11/22/2022 CLINICAL DATA:  Left foot ischemia EXAM: LEFT FOOT - 2 VIEW COMPARISON:  None Available. FINDINGS: Normal alignment. No acute fracture  or dislocation. Mild degenerative changes involving the first metatarsophalangeal joint and midfoot. Osseous structures diffusely osteopenic. No erosions or abnormal periosteal reaction. Mild soft tissue swelling of the dorsum of the left forefoot. Vascular calcifications noted. IMPRESSION: 1. Mild soft tissue swelling. No acute fracture or dislocation. Electronically Signed   By: Fidela Salisbury M.D.   On: 11/22/2022 17:55     Assessment/Plan: Group A streptococcus bacteremia Secondary to the skin lesions No evidence of necrotizing fascitis On cefazolin+ linezolid  culture from the left foot wound sent   Multiple skin lesions on the lower extremities- some are discrete circular R/o pemphigoid VS pemphigis with secondary infection Recommend derm as OP if these dont resolve or recur   Afib- on amiodarone/eliquis   Anemia   PMR- pt was  on hydroxycholoquine/prednisone- not any more ?  Discussed the management with care team

## 2022-11-24 NOTE — Consult Note (Signed)
ANTICOAGULATION CONSULT NOTE - Follow Up Consult  Pharmacy Consult for Heparin infusion (apixaban PTA) Indication:  limb ischemia  Allergies  Allergen Reactions   Aspirin Itching and Other (See Comments)    Dizzy/passing out   Augmentin [Amoxicillin-Pot Clavulanate]    Codeine Other (See Comments)    dizziness   Doxycycline Hyclate Other (See Comments)   Sulfamethoxazole-Trimethoprim Diarrhea   Tape Itching   Vitamin D Analogs    Azithromycin Rash and Hives    Patient Measurements: Height: '5\' 4"'$  (162.6 cm) Weight: 72.3 kg (159 lb 6.3 oz) IBW/kg (Calculated) : 54.7 Heparin Dosing Weight: 75.3 kg  Vital Signs: Temp: 97.4 F (36.3 C) (02/29 0808) BP: 105/70 (02/29 0808) Pulse Rate: 71 (02/29 0808)  Labs: Recent Labs    11/22/22 1612 11/23/22 0321 11/23/22 1519 11/23/22 2341 11/24/22 0807  HGB 11.3* 8.9* 9.1*  --  9.1*  HCT 37.0 27.2* 28.7*  --  28.9*  PLT 338 238  --   --  237  APTT 53* 155* 86* 71* 96*  LABPROT 24.8*  --   --   --   --   INR 2.3*  --   --   --   --   HEPARINUNFRC  --   --  >1.10*  --  >1.10*  CREATININE 1.24* 1.09*  --   --  1.17*     Estimated Creatinine Clearance: 29.3 mL/min (A) (by C-G formula based on SCr of 1.17 mg/dL (H)).  Medications:  PTA: apixaban 2.5 mg BID - last dose 2/27 AM   Assessment: 89 y.o. female past medical history significant for prior breast cancer, hypertension, atrial fibrillation on Eliquis, who presents to the emergency department  from wound clinic. Concern of decreased blood flow to left foot - acute limb ischemia . Pharmacy consulted to manage heparin infusion.   Baseline: aPTT 53, INR 2.3   Goal of Therapy:  Heparin level 0.3-0.7 units/ml aPTT 66-102 seconds Monitor platelets by anticoagulation protocol: Yes  Results:  2/28 @ 0321, aPTT = 166, heparin infusion rate 900 un/hr ->600 un/hr 2/28 @ 1540, aPTT = 86, heparin infusion rate 600 un/hr (therapeutic x 1) 2/28 @ 2341 aPTT = 71, therapeutic X 2   2/29@ 0837 aPTT=96, therapeutic   Plan:  aPTT remains therapeutic, will continue heparin infusion at 600 un/hr aPTT and HL with AM labs  Will use aPTT to guide dosing until correlating with HL  Continue to monitor H&H and platelets   Pernell Dupre, PharmD, BCPS Clinical Pharmacist 11/24/2022 8:49 AM

## 2022-11-24 NOTE — Plan of Care (Signed)
  Problem: Clinical Measurements: Goal: Ability to maintain clinical measurements within normal limits will improve Outcome: Progressing Goal: Will remain free from infection Outcome: Progressing Goal: Diagnostic test results will improve Outcome: Progressing   Problem: Activity: Goal: Risk for activity intolerance will decrease Outcome: Progressing   Problem: Coping: Goal: Level of anxiety will decrease Outcome: Progressing   Problem: Pain Managment: Goal: General experience of comfort will improve Outcome: Progressing   Problem: Safety: Goal: Ability to remain free from injury will improve Outcome: Progressing   Problem: Skin Integrity: Goal: Risk for impaired skin integrity will decrease Outcome: Progressing

## 2022-11-24 NOTE — Consult Note (Signed)
ANTICOAGULATION CONSULT NOTE - Follow Up Consult  Pharmacy Consult for Heparin infusion (apixaban PTA) Indication:  limb ischemia  Allergies  Allergen Reactions   Aspirin Itching and Other (See Comments)    Dizzy/passing out   Augmentin [Amoxicillin-Pot Clavulanate]    Codeine Other (See Comments)    dizziness   Doxycycline Hyclate Other (See Comments)   Sulfamethoxazole-Trimethoprim Diarrhea   Tape Itching   Vitamin D Analogs    Azithromycin Rash and Hives    Patient Measurements: Height: '5\' 4"'$  (162.6 cm) Weight: 72.3 kg (159 lb 6.3 oz) IBW/kg (Calculated) : 54.7 Heparin Dosing Weight: 75.3 kg  Vital Signs: Temp: 97.4 F (36.3 C) (02/29 0014) BP: 92/51 (02/29 0014) Pulse Rate: 77 (02/29 0014)  Labs: Recent Labs    11/22/22 1612 11/23/22 0321 11/23/22 1519 11/23/22 2341  HGB 11.3* 8.9* 9.1*  --   HCT 37.0 27.2* 28.7*  --   PLT 338 238  --   --   APTT 53* 155* 86* 71*  LABPROT 24.8*  --   --   --   INR 2.3*  --   --   --   HEPARINUNFRC  --   --  >1.10*  --   CREATININE 1.24* 1.09*  --   --      Estimated Creatinine Clearance: 31.4 mL/min (A) (by C-G formula based on SCr of 1.09 mg/dL (H)).  Medications:  PTA: apixaban 2.5 mg BID - last dose 2/27 AM   Assessment: 89 y.o. female past medical history significant for prior breast cancer, hypertension, atrial fibrillation on Eliquis, who presents to the emergency department  from wound clinic. Concern of decreased blood flow to left foot - acute limb ischemia . Pharmacy consulted to manage heparin infusion.   Baseline: aPTT 53, INR 2.3   Goal of Therapy:  Heparin level 0.3-0.7 units/ml aPTT 66-102 seconds Monitor platelets by anticoagulation protocol: Yes  Results:  2/28 @ 0321, aPTT = 166, heparin infusion rate 900 un/hr ->600 un/hr 2/28 @ 1540, aPTT = 86, heparin infusion rate 600 un/hr (therapeutic x 1) 2/28 @ 2341 aPTT = 71, therapeutic X 2   Plan:  aPTT therapeutic x 2, will continue heparin  infusion at 600 un/hr aPTT and HL with AM labs  Will use aPTT to guide dosing until correlating with HL  Continue to monitor H&H and platelets   Youa Deloney D 11/24/2022 12:18 AM

## 2022-11-24 NOTE — Progress Notes (Signed)
KATELY, ACERRA B ([id number]) [phone] [id number] Page 1 of 5 Visit Report for 11/22/2022 Abuse Risk Screen Details Patient Name: Date of Service: [name], [name] 11/22/2022 1:30 PM Medical Record Number: [id number] Patient Account Number: [id number] Date of Birth/Sex: Treating RN: 24-Aug-1927 (89 y.o. Orvan Falconer Primary Care [place] Mefferd: Dewayne Shorter Other Clinician: Referring Isaah Furry: Treating Maizie Garno/Extender: Alfonzo Beers Weeks in Treatment: 0 Abuse Risk Screen Items Answer ABUSE RISK SCREEN: Has anyone close to you tried to hurt or harm you recentlyo No Do you feel uncomfortable with anyone in your familyo No Has anyone forced you do things that you didnt want to doo No Electronic Signature(s) Signed: 11/23/2022 11:54:24 AM By: Carlene Coria RN Entered By: Carlene Coria on 11/22/2022 13:53:27 -------------------------------------------------------------------------------- Activities of Daily Living Details Patient Name: Date of Service: [name], [name] 11/22/2022 1:30 PM Medical Record Number: [id number] Patient Account Number: [id number] Date of Birth/Sex: Treating RN: Dec 28, 1926 (89 y.o. Orvan Falconer Primary Care [place] Perry: Dewayne Shorter Other Clinician: Referring Jagjit Riner: Treating Ottilie Wigglesworth/Extender: Alfonzo Beers Weeks in Treatment: 0 Activities of Daily Living Items Answer Activities of Daily Living (Please select one for each item) Drive Automobile Not Able T Medications ake Not Able Use T elephone Not Able Care for Appearance Not Able Use T oilet Not Able Manus Rudd / Shower Not Able Dress Self Not Able Feed Self Not Able Walk Not Able Get In / Out Bed Not [place], [place] ([id number]) [id number] [id number] Page 2 of 5 Prepare Meals Not Able Handle Money Not Able Shop for Self Not Able Electronic Signature(s) Signed: 11/23/2022 11:54:24 AM By: Carlene Coria  RN Entered By: Carlene Coria on 11/22/2022 13:54:02 -------------------------------------------------------------------------------- Education Screening Details Patient Name: Date of Service: [name], [name] 11/22/2022 1:30 PM Medical Record Number: [id number] Patient Account Number: [id number] Date of Birth/Sex: Treating RN: 07/30/1927 (89 y.o. Orvan Falconer Primary Care Daleiza Bacchi: Dewayne Shorter Other Clinician: Referring Aleni Andrus: Treating Chaylee Ehrsam/Extender: [name] in Treatment: 0 Primary Learner Assessed: Patient Learning Preferences/Education Level/Primary Language Learning Preference: Explanation Highest Education Level: High School Preferred Language: English Cognitive Barrier Language Barrier: No Translator Needed: No Memory Deficit: No Emotional Barrier: No Cultural/Religious Beliefs Affecting Medical Care: No Physical Barrier Impaired Vision: No Impaired Hearing: No Decreased Hand dexterity: No Knowledge/Comprehension Knowledge Level: Medium Comprehension Level: Medium Ability to understand written instructions: Medium Ability to understand verbal instructions: Medium Motivation Anxiety Level: Anxious Cooperation: Cooperative Education Importance: Acknowledges Need Interest in Health Problems: Asks Questions Perception: Coherent Willingness to Engage in Self-Management High Activities: Readiness to Engage in Self-Management High Activities: Electronic Signature(s) Signed: 11/23/2022 11:54:24 AM By: Carlene Coria RN Entered By: Carlene Coria on 11/22/2022 13:54:47 Marquard, Natori B ([id number]) [id number] Page 3 of 5 -------------------------------------------------------------------------------- Fall Risk Assessment Details Patient Name: Date of Service: [name], [name] 11/22/2022 1:30 PM Medical Record Number: [id number] Patient Account Number: [id number] Date of Birth/Sex: Treating RN: 03-03-1927 (89  y.o. Orvan Falconer Primary Care Delance Weide: Dewayne Shorter Other Clinician: Referring Brelynn Wheller: Treating Shahil Speegle/Extender: Alfonzo Beers Weeks in Treatment: 0 Fall Risk Assessment Items Have you had 2 or more falls in the last 12 monthso 0 No Have you had any fall that resulted in injury in the last 12 monthso 0 No FALLS RISK SCREEN History of falling - immediate or within 3 months 0 No Secondary diagnosis (Do you have 2 or more medical diagnoseso) 0 No Ambulatory aid None/bed rest/wheelchair/nurse 0  Yes Crutches/cane/walker 0 No Furniture 0 No Intravenous therapy Access/Saline/Heparin Lock 0 No Gait/Transferring Normal/ bed rest/ wheelchair 0 Yes Weak (short steps with or without shuffle, stooped but able to lift head while walking, may seek 0 No support from furniture) Impaired (short steps with shuffle, may have difficulty arising from chair, head down, impaired 0 No balance) Mental Status Oriented to own ability 0 Yes Electronic Signature(s) Signed: 11/23/2022 11:54:24 AM By: Carlene Coria RN Entered By: Carlene Coria on 11/22/2022 13:54:57 -------------------------------------------------------------------------------- Foot Assessment Details Patient Name: Date of Service: [name], [name] 11/22/2022 1:30 PM Medical Record Number: [id number] Patient Account Number: [id number] Date of Birth/Sex: Treating RN: May 24, 1927 (89 y.o. Orvan Falconer Primary Care Theseus Birnie: Dewayne Shorter Other Clinician: Referring Hawke Villalpando: Treating Latroy Gaymon/Extender: [name] in Treatment: 0 Foot Assessment Items Site Locations [place], [place] ([id number]) [phone] [id number] Page 4 of 5 + = Sensation present, - = Sensation absent, C = Callus, U = Ulcer R = Redness, W = Warmth, M = Maceration, PU = Pre-ulcerative lesion F = Fissure, S = Swelling, D = Dryness Assessment Right: Left: Other Deformity: No No Prior Foot Ulcer: No  No Prior Amputation: No No Charcot Joint: No No Ambulatory Status: Ambulatory Without Help Gait: Unsteady Electronic Signature(s) Signed: 11/23/2022 11:54:24 AM By: Carlene Coria RN Entered By: Carlene Coria on 11/22/2022 14:09:29 -------------------------------------------------------------------------------- Nutrition Risk Screening Details Patient Name: Date of Service: [name], [name] 11/22/2022 1:30 PM Medical Record Number: [id number] Patient Account Number: [id number] Date of Birth/Sex: Treating RN: 1926/11/06 (89 y.o. Orvan Falconer Primary Care Patty Lopezgarcia: Dewayne Shorter Other Clinician: Referring Abbagayle Zaragoza: Treating Eugenio Dollins/Extender: Temple Pacini, Jordan Hawks Weeks in Treatment: 0 Height (in): 64 Weight (lbs): 150 Body Mass Index (BMI): 25.7 Nutrition Risk Screening Items Score Screening NUTRITION RISK SCREEN: I have an illness or condition that made me change the kind and/or amount of food I eat 0 No I eat fewer than two meals per day 0 No I eat few fruits and vegetables, or milk products 0 No I have three or more drinks of beer, liquor or wine almost every day 0 No I have tooth or mouth problems that make it hard for me to eat 0 No I don't always have enough money to buy the food I need 0 No CARICIA, GILFORD B ([id number]) [id number] Page 5 of 5 I eat alone most of the time 0 No I take three or more different prescribed or over-the-counter drugs a day 1 Yes Without wanting to, I have lost or gained 10 pounds in the last six months 0 No I am not always physically able to shop, cook and/or feed myself 2 Yes Nutrition Protocols Good Risk Protocol Moderate Risk Protocol 0 Provide education on nutrition High Risk Proctocol Risk Level: Moderate Risk Score: 3 Electronic Signature(s) Signed: 11/23/2022 11:54:24 AM By: Carlene Coria RN Entered By: Carlene Coria on 11/22/2022 13:55:21

## 2022-11-24 NOTE — Progress Notes (Addendum)
[name], [name] [name] ([id number]) [id number] Page 1 of 9 Visit Report for 11/22/2022 Allergy List Details Patient Name: Date of Service: [name], [place] IS [name]. 11/22/2022 1:30 PM Medical Record Number: [id number] Patient Account Number: [id number] Date of Birth/Sex: Treating RN: [birth year] (89 y.o. Stacy [name] Primary Care Maddalynn Barnard: Stacy [name] Other Clinician: Referring Mylinh Cragg: Treating Saurabh Hettich/Extender: Temple Pacini, Jordan Hawks Weeks in Treatment: 0 Allergies Active Allergies aspirin Augmentin codeine doxycycline HCl Sulfa (Sulfonamide Antibiotics) Paper Tape azithromycin Vitamin D2 Allergy Notes Electronic Signature(s) Signed: 11/23/2022 11:54:24 AM By: Carlene Coria RN Entered By: Carlene Coria on 11/22/2022 13:48:20 -------------------------------------------------------------------------------- Arrival Information Details Patient Name: Date of Service: [name], LO IS [name]. 11/22/2022 1:30 PM Medical Record Number: [id number] Patient Account Number: [id number] Date of Birth/Sex: Treating RN: 04/29/1927 (89 y.o. Stacy [name] Primary Care [name]: Stacy [name] Other Clinician: Referring Tinisha Etzkorn: Treating [name]/Extender: [name] in Treatment: 0 Visit Information Patient Arrived: [name] Arrival Time: 13:44 Accompanied By: daughter Transfer Assistance: None Patient Identification Verified: Yes Secondary Verification Process Completed: Yes Patient Requires Transmission-Based Precautions: No Patient Has Alerts: Yes Patient Alerts: ABI unable to obtain Electronic Signature(s) Signed: 11/22/2022 2:30:10 PM By: Carlene Coria RN Entered By: Carlene Coria on 11/22/2022 14:30:09 -------------------------------------------------------------------------------- Clinic Level of Care Assessment Details Patient Name: Date of Service: [name], LO IS [name]. 11/22/2022 1:30 PM [name], [name] [name] ([id number]) [id number]  Page 2 of 9 Medical Record Number: [id number] Patient Account Number: [id number] Date of Birth/Sex: Treating RN: 1927/02/16 (89 y.o. Stacy [name] Primary Care [name]: Stacy [name] Other Clinician: Referring Chayne Baumgart: Treating Hernando Reali/Extender: Alfonzo Beers Weeks in Treatment: 0 Clinic Level of Care Assessment Items TOOL 2 Quantity Score X- 1 0 Use when only an EandM is performed on the INITIAL visit ASSESSMENTS - Nursing Assessment / Reassessment X- 1 20 General Physical Exam (combine w/ comprehensive assessment (listed just below) when performed on new pt. evals) X- 1 25 Comprehensive Assessment (HX, ROS, Risk Assessments, Wounds Hx, etc.) ASSESSMENTS - Wound and Skin A ssessment / Reassessment []  - 0 Simple Wound Assessment / Reassessment - one wound X- 3 5 Complex Wound Assessment / Reassessment - multiple wounds []  - 0 Dermatologic / Skin Assessment (not related to wound area) ASSESSMENTS - Ostomy and/or Continence Assessment and Care []  - 0 Incontinence Assessment and Management []  - 0 Ostomy Care Assessment and Management (repouching, etc.) PROCESS - Coordination of Care []  - 0 Simple Patient / Family Education for ongoing care X- 1 20 Complex (extensive) Patient / Family Education for ongoing care []  - 0 Staff obtains Programmer, systems, Records, T Results / Process Orders est X- 1 10 Staff telephones HHA, Nursing Homes / Clarify orders / etc []  - 0 Routine Transfer to another Facility (non-emergent condition) []  - 0 Routine Hospital Admission (non-emergent condition) X- 1 15 New Admissions / Biomedical engineer / Ordering NPWT Apligraf, etc. , X- 1 20 Emergency Hospital Admission (emergent condition) X- 1 10 Simple Discharge Coordination []  - 0 Complex (extensive) Discharge Coordination PROCESS - Special Needs []  - 0 Pediatric / Minor Patient Management []  - 0 Isolation Patient Management []  - 0 Hearing / Language / Visual special  needs []  - 0 Assessment of Community assistance (transportation, D/C planning, etc.) []  - 0 Additional assistance / Altered mentation []  - 0 Support Surface(s) Assessment (bed, cushion, seat, etc.) INTERVENTIONS - Wound Cleansing / Measurement X- 1 5 Wound Imaging (photographs - any number of wounds) []  - 0 Wound Tracing (instead of photographs) []  -  0 Simple Wound Measurement - one wound X- 3 5 Complex Wound Measurement - multiple wounds []  - 0 Simple Wound Cleansing - one wound X- 3 5 Complex Wound Cleansing - multiple wounds INTERVENTIONS - Wound Dressings X - Small Wound Dressing one or multiple wounds 3 10 []  - 0 Medium Wound Dressing one or multiple wounds []  - 0 Large Wound Dressing one or multiple wounds TASHAYLA, GRODEN [name] ([id number]) [phone].pdf Page 3 of 9 []  - 0 Application of Medications - injection INTERVENTIONS - Miscellaneous []  - 0 External ear exam []  - 0 Specimen Collection (cultures, biopsies, blood, body fluids, etc.) []  - 0 Specimen(s) / Culture(s) sent or taken to Lab for analysis []  - 0 Patient Transfer (multiple staff / Harrel Lemon Lift / Similar devices) []  - 0 Simple Staple / Suture removal (25 or less) []  - 0 Complex Staple / Suture removal (26 or more) []  - 0 Hypo / Hyperglycemic Management (close monitor of Blood Glucose) []  - 0 Ankle / Brachial Index (ABI) - do not check if billed separately Has the patient been seen at the hospital within the last three years: Yes Total Score: 200 Level Of Care: New/Established - Level 5 Electronic Signature(s) Signed: 11/23/2022 11:54:24 AM By: Carlene Coria RN Entered By: Carlene Coria on 11/22/2022 14:52:32 -------------------------------------------------------------------------------- Encounter Discharge Information Details Patient Name: Date of Service: [name], LO IS [name]. 11/22/2022 1:30 PM Medical Record Number: [id number] Patient Account Number: [id number] Date of Birth/Sex:  Treating RN: [birth year] (89 y.o. Stacy [name] Primary Care [name]: Stacy [name] Other Clinician: Referring Asenath Balash: Treating Sadarius Norman/Extender: [name] in Treatment: 0 Encounter Discharge Information Items Discharge Condition: Stable Ambulatory Status: Wheelchair Discharge Destination: Home Transportation: Private Auto Accompanied By: daughter Schedule Follow-up Appointment: Yes Clinical Summary of Care: Electronic Signature(s) Signed: 11/23/2022 11:54:24 AM By: Carlene Coria RN Entered By: Carlene Coria on 11/22/2022 14:54:20 -------------------------------------------------------------------------------- Lower Extremity Assessment Details Patient Name: Date of Service: [name], LO IS [name]. 11/22/2022 1:30 PM Medical Record Number: [id number] Patient Account Number: [id number] Date of Birth/Sex: Treating RN: 08-11-1927 (89 y.o. Stacy [name] Primary Care Reita Shindler: Stacy [name] Other Clinician: Referring Anaiza Behrens: Treating Sheril Hammond/Extender: Alfonzo Beers Weeks in Treatment: 0 Edema Assessment Assessed: [Left: No] [Right: No] Edema: [Left: Ye] [Right: s] Calf Left: Right: Point of Measurement: 30 cm From Medial Instep 32 cm Ankle Left: Right: ADHYA, DLUGOSZ ([id number]) [id number] Page 4 of 9 Point of Measurement: 10 cm From Medial Instep 21 cm Knee To Floor Left: Right: From Medial Instep 40 cm Vascular Assessment Pulses: Dorsalis Pedis Palpable: [Left:Yes Yes] Notes unable to obtain Electronic Signature(s) Signed: 11/22/2022 2:29:49 PM By: Carlene Coria RN Entered By: Carlene Coria on 11/22/2022 14:29:48 -------------------------------------------------------------------------------- Multi Wound Chart Details Patient Name: Date of Service: [name], LO IS [name]. 11/22/2022 1:30 PM Medical Record Number: [id number] Patient Account Number: [id number] Date of Birth/Sex: Treating RN: 04-Apr-1927 (89 y.o. Stacy [name] Primary Care Sylvanus Telford: Stacy [name] Other Clinician: Referring Glendal Cassaday: Treating Priyah Schmuck/Extender: Temple Pacini, Jordan Hawks Weeks in Treatment: 0 Vital Signs Height(in): 64 Pulse(bpm): 76 Weight(lbs): 150 Blood Pressure(mmHg): 131/78 Body Mass Index(BMI): 25.7 Temperature(F): 98.1 Respiratory Rate(breaths/min): 18 [Treatment Notes:Wound Assessments Treatment Notes] Electronic Signature(s) Signed: 11/23/2022 11:54:24 AM By: Carlene Coria RN Entered By: Carlene Coria on 11/22/2022 14:09:34 -------------------------------------------------------------------------------- Multi-Disciplinary Care Plan Details Patient Name: Date of Service: [name], LO IS [name]. 11/22/2022 1:30 PM Medical Record Number: [id number] Patient Account Number: [id number] Date of Birth/Sex: Treating RN: 08-10-1927 (89 y.o. F) Epps,  Morey Hummingbird Primary Care Alyria Krack: Stacy [name] Other Clinician: Referring Darryl Blumenstein: Treating Ledford Goodson/Extender: [name] in Treatment: 0 Active Inactive Electronic Signature(s) Signed: 12/13/2022 5:50:39 PM By: Gretta Cool, BSN, RN, CWS, Kim RN, BSN Signed: 12/20/2022 8:43:14 AM By: Carlene Coria RN Previous Signature: 11/23/2022 11:54:24 AM Version By: Carlene Coria RN Entered By: Gretta Cool, BSN, RN, CWS, Kim on 12/13/2022 17:50:39 Ames Dura ([id number]) [phone].pdf Page 5 of 9 -------------------------------------------------------------------------------- Pain Assessment Details Patient Name: Date of Service: [name], LO IS [name]. 11/22/2022 1:30 PM Medical Record Number: [id number] Patient Account Number: [id number] Date of Birth/Sex: Treating RN: 11-10-1926 (89 y.o. Stacy [name] Primary Care Eldor Conaway: Stacy [name] Other Clinician: Referring Chaeli Judy: Treating Lamesha Tibbits/Extender: Alfonzo Beers Weeks in Treatment: 0 Active Problems Location of Pain Severity and Description of Pain Patient Has Paino  Yes Site Locations With Dressing Change: Yes Duration of the Pain. Constant / Intermittento Intermittent How Long Does it Lasto Hours: Minutes: 15 Rate the pain. Current Pain Level: 8 Worst Pain Level: 10 Least Pain Level: 0 Tolerable Pain Level: 0 Character of Pain Describe the Pain: Burning Pain Management and Medication Current Pain Management: Medication: Yes Cold Application: No Rest: Yes Massage: No Activity: No T.E.N.S.: No Heat Application: No Leg drop or elevation: No Is the Current Pain Management Adequate: Inadequate How does your wound impact your activities of daily livingo Sleep: Yes Bathing: No Appetite: No Relationship With Others: No Bladder Continence: No Emotions: No Bowel Continence: No Work: No Toileting: No Drive: No Dressing: No Hobbies: No Electronic Signature(s) Signed: 11/23/2022 11:54:24 AM By: Carlene Coria RN Entered By: Carlene Coria on 11/22/2022 13:45:47 -------------------------------------------------------------------------------- Patient/Caregiver Education Details Patient Name: Date of Service: [name], LO IS [name]. 2/27/2024andnbsp1:30 PM Medical Record Number: [id number] Patient Account Number: [id number] Date of Birth/Gender: Treating RN: Aug 22, 1927 (89 y.o. Stacy [name] Primary Care Physician: Stacy [name] Other Clinician: Referring Physician: Treating Physician/Extender: [name] in Treatment: 0 Education Assessment Education Provided To: Patient CLARETTA, REMUS ([id number]) [id number] Page 6 of 9 Education Topics Provided Welcome T [facility]New [facility] Packet: o Methods: Explain/Verbal Responses: State content correctly Electronic Signature(s) Signed: 11/23/2022 11:54:24 AM By: Carlene Coria RN Entered By: Carlene Coria on 11/22/2022 14:14:10 -------------------------------------------------------------------------------- Wound Assessment Details Patient  Name: Date of Service: [name], LO IS [name]. 11/22/2022 1:30 PM Medical Record Number: [id number] Patient Account Number: [id number] Date of Birth/Sex: Treating RN: [birth year] (89 y.o. Stacy [name] Primary Care Waverly Tarquinio: Stacy [name] Other Clinician: Referring Dazaria Macneill: Treating Alixandra Alfieri/Extender: Alfonzo Beers Weeks in Treatment: 0 Wound Status Wound Number: 1 Primary Arterial Insufficiency Ulcer Etiology: Wound Location: Left T [place] oe Wound Open Wounding Event: Gradually Appeared Status: Date Acquired: 10/27/2022 Comorbid Chronic Obstructive Pulmonary Disease (COPD), Arrhythmia, Weeks Of Treatment: 0 History: Congestive Heart Failure, Hypertension, Received Radiation Clustered Wound: No Pending Amputation On Presentation Photos Wound Measurements Length: (cm) 3 Width: (cm) 3 Depth: (cm) 0.4 Area: (cm) 7.069 Volume: (cm) 2.827 % Reduction in Area: % Reduction in Volume: Epithelialization: None Tunneling: No Undermining: No Wound Description Classification: Full Thickness Without Exposed Suppor Exudate Amount: Medium Exudate Type: Serosanguineous Exudate Color: red, brown t Structures Foul Odor After Cleansing: No Slough/Fibrino Yes Wound Bed Granulation Amount: Small (1-33%) Exposed Structure Granulation Quality: Red Fascia Exposed: No Necrotic Amount: Large (67-100%) Fat Layer (Subcutaneous Tissue) Exposed: No Necrotic Quality: Adherent Slough Tendon Exposed: No Muscle Exposed: No Joint Exposed: No Bone Exposed: No Electronic Signature(s) Signed: 11/23/2022 11:54:24 AM By: Carlene Coria  RN Previous Signature: 11/22/2022 2:25:47 PM Version By: Carlene Coria RN Entered By: Carlene Coria on 11/22/2022 14:44:04 Ames Dura ([id number] Page 7 of 9 -------------------------------------------------------------------------------- Wound Assessment Details Patient Name: Date of Service: [name], LO IS [name]. 11/22/2022  1:30 PM Medical Record Number: [id number] Patient Account Number: [id number] Date of Birth/Sex: Treating RN: 01-Jan-1927 (89 y.o. Stacy [name] Primary Care Wilfredo Canterbury: Stacy [name] Other Clinician: Referring Jessy Calixte: Treating Breella Vanostrand/Extender: Alfonzo Beers Weeks in Treatment: 0 Wound Status Wound Number: 2 Primary Arterial Insufficiency Ulcer Etiology: Wound Location: Left Calcaneus Wound Open Wounding Event: Gradually Appeared Status: Date Acquired: 10/27/2022 Comorbid Chronic Obstructive Pulmonary Disease (COPD), Arrhythmia, Weeks Of Treatment: 0 History: Congestive Heart Failure, Hypertension, Received Radiation Clustered Wound: No Pending Amputation On Presentation Photos Wound Measurements Length: (cm) 3 Width: (cm) 3.5 Depth: (cm) 0.3 Area: (cm) 8.247 Volume: (cm) 2.474 % Reduction in Area: % Reduction in Volume: Epithelialization: None Tunneling: No Undermining: No Wound Description Classification: Full Thickness Without Exposed Suppor Exudate Amount: Medium Exudate Type: Serosanguineous Exudate Color: red, brown t Structures Foul Odor After Cleansing: No Slough/Fibrino Yes Wound Bed Granulation Amount: Small (1-33%) Exposed Structure Granulation Quality: Red Fascia Exposed: No Necrotic Amount: Large (67-100%) Fat Layer (Subcutaneous Tissue) Exposed: Yes Necrotic Quality: Eschar, Adherent Slough Tendon Exposed: No Muscle Exposed: No Joint Exposed: No Bone Exposed: No Electronic Signature(s) Signed: 11/23/2022 11:54:24 AM By: Carlene Coria RN Previous Signature: 11/22/2022 2:27:31 PM Version By: Carlene Coria RN Entered By: Carlene Coria on 11/22/2022 14:44:23 -------------------------------------------------------------------------------- Wound Assessment Details Patient Name: Date of Service: [name], LO IS [name]. 11/22/2022 1:30 PM Medical Record Number: [id number] Patient Account Number: [id number] Date of Birth/Sex: Treating RN: September 22, 1927  (89 y.o. Stacy [name] Primary Care Deontrae Drinkard: Stacy [name] Other Clinician: Referring Ashyr Hedgepath: Treating Aerie Donica/Extender: [name] in Treatment: [address] [place] ([id number]) [id number] Page 8 of 9 Wound Status Wound Number: 3 Primary Arterial Insufficiency Ulcer Etiology: Wound Location: Left Metatarsal head fifth Wound Open Wounding Event: Gradually Appeared Status: Date Acquired: 11/15/2022 Comorbid Chronic Obstructive Pulmonary Disease (COPD), Arrhythmia, Weeks Of Treatment: 0 History: Congestive Heart Failure, Hypertension, Received Radiation Clustered Wound: No Pending Amputation On Presentation Photos Wound Measurements Length: (cm) 2.2 Width: (cm) 3.5 Depth: (cm) 0.2 Area: (cm) 6.048 Volume: (cm) 1.21 % Reduction in Area: % Reduction in Volume: Epithelialization: None Tunneling: No Undermining: No Wound Description Classification: Full Thickness Without Exposed Support Structures Exudate Amount: Medium Exudate Type: Serosanguineous Exudate Color: red, brown Foul Odor After Cleansing: No Slough/Fibrino Yes Wound Bed Granulation Amount: None Present (0%) Exposed Structure Necrotic Amount: Large (67-100%) Fascia Exposed: No Necrotic Quality: Eschar, Adherent Slough Fat Layer (Subcutaneous Tissue) Exposed: No Tendon Exposed: No Muscle Exposed: No Joint Exposed: No Bone Exposed: No Electronic Signature(s) Signed: 11/23/2022 11:54:24 AM By: Carlene Coria RN Previous Signature: 11/22/2022 2:28:52 PM Version By: Carlene Coria RN Entered By: Carlene Coria on 11/22/2022 14:44:39 -------------------------------------------------------------------------------- Vitals Details Patient Name: Date of Service: [name], LO IS [name]. 11/22/2022 1:30 PM Medical Record Number: [id number] Patient Account Number: [id number] Date of Birth/Sex: Treating RN: 1926/09/29 (89 y.o. Stacy [name] Primary Care Isabellarose Kope: Stacy [name] Other Clinician: Referring Terrance Lanahan: Treating Gerrad Welker/Extender: Alfonzo Beers Weeks in Treatment: 0 Vital Signs Time Taken: 13:45 Temperature (F): 98.1 Height (in): 64 Pulse (bpm): 76 Source: Stated Respiratory Rate (breaths/min): 18 Weight (lbs): 150 Blood Pressure (mmHg): 131/78 Source: Stated Reference Range: 80 - 120 mg / dl Body Mass Index (BMI): 25.7 Electronic Signature(s) Addair, Jolissa  [name] ([id number].pdf Page 9 of 9 Signed: 11/23/2022 11:54:24 AM By: Carlene Coria RN Entered By: Carlene Coria on 11/22/2022 13:46:30

## 2022-11-24 NOTE — Progress Notes (Signed)
[facility] Progress Note  Patient: Stacy [name]    [id number]  DOA: 11/22/2022     Date of Service: the patient was seen and examined on 11/24/2022  Chief Complaint  Patient presents with   Wound Check   Brief hospital course: Stacy [name] is a 89 y.o. female with medical history significant of hypertension, fibromyalgia, atrial fibrillation on anticoagulation with Eliquis,AAA history of breast cancer.  She presents to the emergency room with new left lower extremity ulcers on first and fifth digit.  Onset of symptoms few days prior.  She has previously been seen and evaluated in the past for similar lower extremity ulcers with associated cellulitis.  Her last admission was at [facility] in December 2023.  At that time, patient met SIRS criteria and required admission.  She was treated with vancomycin and cefepime.  She has since been following up with wound care clinic as outpatient.  She presented today with new extremity ulcers and associated pain.  No obvious drainage from wound site.   X-rays obtained showed no bony involvement.  Patient was initiated on IV antibiotics with cefepime and vancomycin.  No obvious leukocytosis or left shift.  ED consulted vascular surgeon Dr. Franchot Gallo who recommended initiating patient on heparin protocol pending evaluation in a.m. for possible angiogram.  This explained to patient and family.  They agree with this plan as detailed.   Assessment and Plan:  # Recurrent left lower extremity cellulitis:  New findings of ulcers on toes and on the heel.  There is also a lesion on anterior shin.  Considering recurrence of these ulcers could be due to PVD vascular surgery consulted, plan is for angiogram when patient is stable.   Continue heparin IV infusion Due to bacteremia vascular surgery will do angiogram most likely on Monday   # Strep pyogenes bacteremia due to left lower extremity cellulitis Started cefazolin 2 g IV every 8 hourly and started linezolid  600 mg IV every 12 hourly Blood culture growing strep pathogen, follow sensitivity report 2/29 Repeat blood cultures collected today a.m. TTE shows LVEF 30 to 35%, moderately decreased function, global hypokinesis with severe hypokinesis of anterior and anterolateral wall.  Severe tricuspid regurgitation, and moderate to severe aortic valve stenosis.  No mention of endocarditis ID consulted for recommendation of antibiotics and duration.   # Atrial fibrillation on anticoagulation with Eliquis.  Continued amiodarone 100 mg p.o. daily home dose Metoprolol 12.5 mg p.o. twice daily, skip the dose today due to low blood pressure Held Eliquis for now as patient is on heparin IV infusion   # Hypotension, patient's blood pressure dropped, patient was not septic. Continue IV fluid for hydration Skipped a dose of metoprolol on 2/28, discontinued metoprolol on 2/29 due to persistent low blood pressure Started midodrine 5 mg p.o. 3 times daily with holding parameters We will continue to monitor BP and titrate medications accordingly  # Hypokalemia, potassium repleted. # Hypomagnesemia, mag repleted. # Hypoglycemia when she was n.p.o.  Hypoglycemia protocol was followed.  Monitor CBG. Monitor electrolytes and replete as needed.   CKD stage IIIa: Stable renal function.   Aneurysmal suprarenal abdominal aorta (3.2 cm) : chronic.    Polymyalgia rheumatic/inflammatory arthritis: Patient is on chronic Plaquenil. Hold Plaquenil for now due to bacteremia, resume on discharge.  CHF: Combined diastolic and systolic: LVEF of 45 to [id number].  Not in acute exacerbation at this time. 2/28 TTE LVEF 30 to 25%, complete report as above.  Conjunctivitis: Ofloxacin eyedrops ordered, use  to fresh tear as needed.   Hypothyroidism: Continue on Synthroid.    Anemia of chronic disease Hb 11.3-8.9-9.1 Monitor H&H and transfuse if hemoglobin less than 7 Check fecal occult blood to rule out GI bleeding   Body mass  index is 27.36 kg/m.  Interventions:      Diet: Regular diet DVT Prophylaxis: Therapeutic Anticoagulation with heparin IV infusion    Advance goals of care discussion: DNR  Family Communication: family was present at bedside, at the time of interview.  The pt provided permission to discuss medical plan with the family. Opportunity was given to ask question and all questions were answered satisfactorily.   Disposition:  Pt is from Home, admitted with left lower extremity cellulitis and PVD, needs vascular surgery evaluation and ID for IV antibiotics, which precludes a safe discharge. Discharge to Home vs SNF TBD, when stable, may need few days to stay..  Subjective: No significant events overnight, in the morning time patient ate a lot with breakfast and she had an episode of vomiting.  Antiemetic were given.  Lower extremity pain is under control.  Patient denies any chest pain or palpitation, no shortness of breath. Patient blood sugar was low in the morning, D50 was given, RN is monitoring blood sugar closely.   Physical Exam: General: NAD, lying comfortably Appear in no distress, affect appropriate Eyes: PERRLA ENT: Oral Mucosa Clear, moist  Neck: no JVD,  Cardiovascular: Irregular rhythm,  Murmur audible due to mod to severe aortic valve stenosis,  Respiratory: good respiratory effort, Bilateral Air entry equal and Decreased, no Crackles, no wheezes Abdomen: Bowel Sound present, Soft and no tenderness,  Skin: no rashes Extremities: no Pedal edema, no calf tenderness, LLE lateral surface wound covered with dressing, blackish discolored wound on the great toe, bluish discoloration of great toe, and lateral surface of the foot near the fifth digit.  Left heel unstageable ulcer Neurologic: without any new focal findings Gait not checked due to patient safety concerns  Vitals:   11/23/22 1615 11/23/22 2100 11/24/22 0014 11/24/22 0808  BP: (!) 87/63 97/64 (!) 92/51 105/70  Pulse:  74 88 77 71  Resp:   16 16  Temp:   (!) 97.4 F (36.3 C) (!) 97.4 F (36.3 C)  TempSrc:      SpO2:   100% 100%  Weight:      Height:        Intake/Output Summary (Last 24 hours) at 11/24/2022 1603 Last data filed at 11/24/2022 1437 Gross per 24 hour  Intake 232.2 ml  Output 250 ml  Net -17.8 ml   Filed Weights   11/22/22 2000  Weight: 72.3 kg    Data Reviewed: I have personally reviewed and interpreted daily labs, tele strips, imagings as discussed above. I reviewed all nursing notes, pharmacy notes, vitals, pertinent old records I have discussed plan of care as described above with RN and patient/family.  CBC: Recent Labs  Lab 11/22/22 1612 11/23/22 0321 11/23/22 1519 11/24/22 0807  WBC 7.3 6.7  --  5.8  HGB 11.3* 8.9* 9.1* 9.1*  HCT 37.0 27.2* 28.7* 28.9*  MCV 95.1 92.5  --  94.8  PLT 338 238  --  [id number]   Basic Metabolic Panel: Recent Labs  Lab 11/22/22 1612 11/23/22 0321 11/24/22 0807  NA 137 138 137  K 3.8 3.8 3.2*  CL 104 109 107  CO2 22 20* 22  GLUCOSE 74 67* 71  BUN 34* 34* 30*  CREATININE 1.24* 1.09* 1.17*  CALCIUM 8.5* 7.6* 7.7*  MG  --   --  1.0*  PHOS  --   --  3.3    Studies: ECHOCARDIOGRAM COMPLETE  Result Date: 11/24/2022    ECHOCARDIOGRAM REPORT   Patient Name:   KITTI DOORNBOS Date of Exam: 11/23/2022 Medical Rec #:  [id number]   Height:       64.0 in Accession #:    [id number]  Weight:       159.4 lb Date of Birth:  1927-01-05    BSA:          1.776 m Patient Age:    45 years    BP:           99/59 mmHg Patient Gender: F           HR:           101 bpm. Exam Location:  [facility] Procedure: 2D Echo, Cardiac Doppler and Color Doppler Indications:     R78.81 Bacteremia  History:         Patient has prior history of Echocardiogram examinations, most                  recent 07/04/2022. Risk Factors:Hypertension.  Sonographer:     Cresenciano Lick RDCS Referring Phys:  [id number] Val Riles Diagnosing Phys: Ida Rogue MD IMPRESSIONS  1. Left  ventricular ejection fraction, by estimation, is 30 to 35%. The left ventricle has moderately decreased function. The left ventricle demonstrates global hypokinesis with severe hypokinesis of the anterior and anteroseptal wall. There is moderate  left ventricular hypertrophy. Left ventricular diastolic parameters are indeterminate.  2. Right ventricular systolic function is normal. The right ventricular size is normal. There is normal pulmonary artery systolic pressure.  3. Left atrial size was mildly dilated.  4. The mitral valve is normal in structure. Mild mitral valve regurgitation. No evidence of mitral stenosis. Moderate mitral annular calcification.  5. Tricuspid valve regurgitation is severe.  6. The aortic valve is normal in structure. There is severe calcifcation of the aortic valve. Aortic valve regurgitation is not visualized. Moderate to severe aortic valve stenosis, though degree of stenosis may be underestimated in setting of low EF. Aortic valve area, by VTI measures 0.42 cm. Aortic valve mean gradient measures 18.6 mmHg. Aortic valve Vmax measures 2.76 m/s.  7. There is borderline dilatation of the aortic root, measuring 37 mm.  8. The inferior vena cava is normal in size with greater than 50% respiratory variability, suggesting right atrial pressure of 3 mmHg. FINDINGS  Left Ventricle: Left ventricular ejection fraction, by estimation, is 30 to 35%. The left ventricle has moderately decreased function. The left ventricle demonstrates global hypokinesis. The left ventricular internal cavity size was normal in size. There is moderate left ventricular hypertrophy. Left ventricular diastolic parameters are indeterminate. Right Ventricle: The right ventricular size is normal. No increase in right ventricular wall thickness. Right ventricular systolic function is normal. There is normal pulmonary artery systolic pressure. The tricuspid regurgitant velocity is 2.41 m/s, and  with an assumed right atrial  pressure of 5 mmHg, the estimated right ventricular systolic pressure is [id number] mmHg. Left Atrium: Left atrial size was mildly dilated. Right Atrium: Right atrial size was normal in size. Pericardium: There is no evidence of pericardial effusion. Mitral Valve: The mitral valve is normal in structure. Moderate mitral annular calcification. Mild mitral valve regurgitation. No evidence of mitral valve stenosis. Tricuspid Valve: The tricuspid valve is normal in structure. Tricuspid valve regurgitation is  severe. No evidence of tricuspid stenosis. Aortic Valve: The aortic valve is normal in structure. There is severe calcifcation of the aortic valve. Aortic valve regurgitation is not visualized. Moderate to severe aortic stenosis is present. Aortic valve mean gradient measures 18.6 mmHg. Aortic valve peak gradient measures 30.4 mmHg. Aortic valve area, by VTI measures 0.42 cm. Pulmonic Valve: The pulmonic valve was normal in structure. Pulmonic valve regurgitation is mild. No evidence of pulmonic stenosis. Aorta: The aortic root is normal in size and structure. There is borderline dilatation of the aortic root, measuring 37 mm. Venous: The inferior vena cava is normal in size with greater than 50% respiratory variability, suggesting right atrial pressure of 3 mmHg. IAS/Shunts: No atrial level shunt detected by color flow Doppler.  LEFT VENTRICLE PLAX 2D LVIDd:         4.10 cm LVIDs:         3.50 cm LV PW:         1.20 cm LV IVS:        1.20 cm LVOT diam:     2.00 cm LV SV:         20 LV SV Index:   11 LVOT Area:     3.14 cm  RIGHT VENTRICLE            IVC RV Basal diam:  3.70 cm    IVC diam: 1.10 cm RV S prime:     8.77 cm/s LEFT ATRIUM             Index        RIGHT ATRIUM           Index LA diam:        4.20 cm 2.36 cm/m   RA Area:     26.60 cm LA Vol (A2C):   80.1 ml 45.09 ml/m  RA Volume:   101.00 ml 56.85 ml/m LA Vol (A4C):   62.4 ml 35.13 ml/m LA Biplane Vol: 72.0 ml 40.53 ml/m  AORTIC VALVE AV Area (Vmax):     0.47 cm AV Area (Vmean):   0.43 cm AV Area (VTI):     0.42 cm AV Vmax:           275.80 cm/s AV Vmean:          202.600 cm/s AV VTI:            0.487 m AV Peak Grad:      30.4 mmHg AV Mean Grad:      18.6 mmHg LVOT Vmax:         41.27 cm/s LVOT Vmean:        27.433 cm/s LVOT VTI:          0.064 m LVOT/AV VTI ratio: 0.13  AORTA Ao Root diam: 3.20 cm Ao Asc diam:  3.70 cm MV E velocity: 110.00 cm/s  TRICUSPID VALVE                             TR Peak grad:   23.2 mmHg                             TR Vmax:        241.00 cm/s                              SHUNTS  Systemic VTI:  0.06 m                             Systemic Diam: 2.00 cm Ida Rogue MD Electronically signed by Ida Rogue MD Signature Date/Time: 11/24/2022/7:34:34 AM    Final     Scheduled Meds:  acetaminophen  500 mg Oral TID   amiodarone  100 mg Oral Daily   vitamin C  500 mg Oral BID   calcium carbonate  600 mg of elemental calcium Oral BID WC   feeding supplement  237 mL Oral TID BM   ferrous sulfate  325 mg Oral QODAY   gabapentin  300 mg Oral TID   levothyroxine  50 mcg Oral QAC breakfast   midodrine  5 mg Oral TID WC   multivitamin with minerals  1 tablet Oral Daily   ofloxacin  1 drop Both Eyes QID   pantoprazole  40 mg Oral BID   zinc sulfate  220 mg Oral Daily   Continuous Infusions:   ceFAZolin (ANCEF) IV 2 g (11/24/22 1425)   heparin 600 Units/hr (11/24/22 0726)   linezolid (ZYVOX) IV 600 mg (11/24/22 1029)   potassium chloride 10 mEq (11/24/22 1526)   PRN Meds: acetaminophen **OR** acetaminophen, albuterol, HYDROcodone-acetaminophen, ondansetron (ZOFRAN) IV, ondansetron  Time spent: 50 minutes  Author: Val Riles. MD [facility] 11/24/2022 4:03 PM  To reach On-call, see care teams to locate the attending and reach out to them via [email or web address]. If 7PM-7AM, please contact night-coverage If you still have difficulty reaching the attending provider, please page the [facility]  (Director on Call) for [facility] on amion for assistance.

## 2022-11-24 NOTE — TOC Progression Note (Signed)
Transition of Care [facility]) - Progression Note    Patient Details  Name: Stacy [name] MRN: [id number] Date of Birth: 1927/03/04  Transition of Care [facility]) CM/SW [place], RN Phone Number: 11/24/2022, 12:10 PM  Clinical Narrative:    TOC to continue to follow for DC planning and needs   Expected Discharge Plan: [place] Barriers to Discharge: Continued Medical Work up  Expected Discharge Plan and [place] arrangements for the past 2 months: Single Family Home                                       Social Determinants of Health (SDOH) Interventions SDOH Screenings   Food Insecurity: No Food Insecurity (11/22/2022)  Housing: Low Risk  (11/22/2022)  Transportation Needs: No Transportation Needs (11/22/2022)  Utilities: Not At Risk (11/22/2022)  Alcohol Screen: Low Risk  (02/04/2019)  Depression (PHQ2-9): Low Risk  (08/30/2022)  Financial Resource Strain: Low Risk  (04/07/2021)  Tobacco Use: Low Risk  (11/22/2022)    Readmission Risk Interventions     No data to display

## 2022-11-24 NOTE — Progress Notes (Signed)
Progress Note    11/24/2022 11:50 AM * No surgery date entered *  Subjective:   Stacy [name] is a 89 y.o. female y.o. female with medical history significant of hypertension, fibromyalgia, atrial fibrillation on anticoagulation with Eliquis,AAA, and a history of breast cancer.  She presents to the emergency room with new left lower extremity ulcers on first and fifth digit. She has previously been seen and evaluated in the past for similar lower extremity ulcers with associated cellulitis.    On exam today the patient was sitting up in bed resting comfortably eating breakfast.  He is much more alert this morning.  I was able to have a full conversation with the patient.  We discussed her left lower extremity in detail.  Patient states she feels much better today.  She denies any chest pain or shortness of breath.  She does admit to some resting pain in her left lower extremity.  She remains on antibiotics at this time.  No complaints overnight.  Vitals all remained stable.   Vitals:   11/24/22 0014 11/24/22 0808  BP: (!) 92/51 105/70  Pulse: 77 71  Resp: 16 16  Temp: (!) 97.4 F (36.3 C) (!) 97.4 F (36.3 C)  SpO2: 100% 100%   Physical Exam: Cardiac: Irregular rhythm history of A-fib.  Murmur on auscultation related to severe aortic valve stenosis Lungs: Lungs clear throughout on auscultation.  Diminished in the bases.  Normal respiratory effort this morning. Incisions:  N/A Extremities: Left lower extremity has noted great toe and fifth digit with black eschar.  2 noted open ulcerations/sores on the left calf.  Left heel unstageable ulcer. Abdomen: Positive bowel sounds, soft, flat, nontender, nondistended. Neurologic: AAOx3, neuro intact today, all questions appropriately.  CBC    Component Value Date/Time   WBC 5.8 11/24/2022 0807   RBC 3.05 (L) 11/24/2022 0807   HGB 9.1 (L) 11/24/2022 0807   HGB 11.8 (L) 03/06/2013 1436   HCT 28.9 (L) 11/24/2022 0807   HCT 35.7 03/06/2013  1436   PLT 237 11/24/2022 0807   PLT 249 03/06/2013 1436   MCV 94.8 11/24/2022 0807   MCV 84 03/06/2013 1436   MCH 29.8 11/24/2022 0807   MCHC 31.5 11/24/2022 0807   RDW 22.4 (H) 11/24/2022 0807   RDW 15.0 (H) 03/06/2013 1436   LYMPHSABS 1.0 09/05/2022 1809   LYMPHSABS 2.3 03/06/2013 1436   MONOABS 0.6 09/05/2022 1809   MONOABS 0.9 03/06/2013 1436   EOSABS 0.0 09/05/2022 1809   EOSABS 0.5 03/06/2013 1436   BASOSABS 0.0 09/05/2022 1809   BASOSABS 0.1 03/06/2013 1436    BMET    Component Value Date/Time   NA 137 11/24/2022 0807   NA 136 (A) 11/14/2022 0000   NA 138 03/06/2013 1436   K 3.2 (L) 11/24/2022 0807   K 4.0 03/06/2013 1436   CL 107 11/24/2022 0807   CL 103 03/06/2013 1436   CO2 22 11/24/2022 0807   CO2 29 03/06/2013 1436   GLUCOSE 71 11/24/2022 0807   GLUCOSE 90 03/06/2013 1436   BUN 30 (H) 11/24/2022 0807   BUN 18 11/14/2022 0000   BUN 18 03/06/2013 1436   CREATININE 1.17 (H) 11/24/2022 0807   CREATININE 0.83 03/06/2013 1436   CALCIUM 7.7 (L) 11/24/2022 0807   CALCIUM 9.5 03/06/2013 1436   GFRNONAA 44 (L) 11/24/2022 0807   GFRNONAA >60 03/06/2013 1436   GFRAA >60 03/06/2013 1436    INR    Component Value Date/Time  INR 2.3 (H) 11/22/2022 1612   INR 0.8 05/09/2012 2317     Intake/Output Summary (Last 24 hours) at 11/24/2022 1150 Last data filed at 11/24/2022 0810 Gross per 24 hour  Intake 163.9 ml  Output 250 ml  Net -86.1 ml     Assessment/Plan:  89 y.o. female is admitted for left lower extremity cellulitis with PVD with eschar to fifth digit and great toe, open left calf ulcers, and unstageable left heel ulcer.* No surgery date entered *  Patient is much more alert this morning.  She is able to hold conversation.  We discussed in detail the condition of her left lower extremity.  She fully understands and acknowledges that is why she has been admitted to the hospital.  I discussed in detail with her the procedure, benefits, risks, and  complications.  She verbalizes her understanding.  Answered all the patient's questions this morning.  He would like to proceed with a left lower extremity angiogram with possible intervention as soon as possible.  Patient was made aware that she did have positive blood cultures yesterday.  Therefore we would wait until Monday in order to do the left lower extremity angiogram, giving her 3 to 4 days of IV antibiotics prior to possible stent placement.  She verbalizes her understanding.  Plan is for vascular surgery take the patient to the vascular lab for left lower extremity angiogram with possible intervention such as stent placement with angioplasty.  She is in agreement with this plan.  Patient will stay on heparin infusion until then.  Patient will be made n.p.o. Sunday at midnight for procedure Monday.  Discussed the plan with Dr. Leotis Pain he is in agreement with the plan.  DVT prophylaxis:  Heparin Infusion   Drema Pry [facility] 11/24/2022 11:50 AM

## 2022-11-25 DIAGNOSIS — R7881 Bacteremia: Secondary | ICD-10-CM | POA: Diagnosis not present

## 2022-11-25 DIAGNOSIS — L089 Local infection of the skin and subcutaneous tissue, unspecified: Secondary | ICD-10-CM | POA: Diagnosis not present

## 2022-11-25 DIAGNOSIS — L03116 Cellulitis of left lower limb: Secondary | ICD-10-CM | POA: Diagnosis not present

## 2022-11-25 DIAGNOSIS — T148XXA Other injury of unspecified body region, initial encounter: Secondary | ICD-10-CM | POA: Diagnosis not present

## 2022-11-25 LAB — CBC
HCT: 29.9 % — ABNORMAL LOW (ref 36.0–46.0)
Hemoglobin: 9.6 g/dL — ABNORMAL LOW (ref 12.0–15.0)
MCH: 30.2 pg (ref 26.0–34.0)
MCHC: 32.1 g/dL (ref 30.0–36.0)
MCV: 94 fL (ref 80.0–100.0)
Platelets: 247 10*3/uL (ref 150–400)
RBC: 3.18 MIL/uL — ABNORMAL LOW (ref 3.87–5.11)
RDW: 22.2 % — ABNORMAL HIGH (ref 11.5–15.5)
WBC: 6.9 10*3/uL (ref 4.0–10.5)
nRBC: 0 % (ref 0.0–0.2)

## 2022-11-25 LAB — BASIC METABOLIC PANEL
Anion gap: 8 (ref 5–15)
BUN: 28 mg/dL — ABNORMAL HIGH (ref 8–23)
CO2: 20 mmol/L — ABNORMAL LOW (ref 22–32)
Calcium: 8 mg/dL — ABNORMAL LOW (ref 8.9–10.3)
Chloride: 105 mmol/L (ref 98–111)
Creatinine, Ser: 1.12 mg/dL — ABNORMAL HIGH (ref 0.44–1.00)
GFR, Estimated: 46 mL/min — ABNORMAL LOW (ref >60)
Glucose, Bld: 92 mg/dL (ref 70–99)
Potassium: 3.7 mmol/L (ref 3.5–5.1)
Sodium: 133 mmol/L — ABNORMAL LOW (ref 135–145)

## 2022-11-25 LAB — GLUCOSE, CAPILLARY
Glucose-Capillary: 88 mg/dL (ref 70–99)
Glucose-Capillary: 99 mg/dL (ref 70–99)
Glucose-Capillary: 81 mg/dL (ref 70–99)
Glucose-Capillary: 89 mg/dL (ref 70–99)
Glucose-Capillary: 79 mg/dL (ref 70–99)
Glucose-Capillary: 64 mg/dL — ABNORMAL LOW (ref 70–99)

## 2022-11-25 LAB — PHOSPHORUS: Phosphorus: 3.1 mg/dL (ref 2.5–4.6)

## 2022-11-25 LAB — CULTURE, BLOOD (ROUTINE X 2): Special Requests: ADEQUATE

## 2022-11-25 LAB — APTT: aPTT: 98 seconds — ABNORMAL HIGH (ref 24–36)

## 2022-11-25 LAB — MAGNESIUM: Magnesium: 1.7 mg/dL (ref 1.7–2.4)

## 2022-11-25 LAB — HEPARIN LEVEL (UNFRACTIONATED): Heparin Unfractionated: 1 IU/mL — ABNORMAL HIGH (ref 0.30–0.70)

## 2022-11-25 NOTE — Progress Notes (Signed)
[facility] Progress Note  Patient: Stacy [name]    [id number]  DOA: 11/22/2022     Date of Service: the patient was seen and examined on 11/25/2022  Chief Complaint  Patient presents with   Wound Check   Brief hospital course: Stacy [name] is a 89 y.o. female with medical history significant of hypertension, fibromyalgia, atrial fibrillation on anticoagulation with Eliquis,AAA history of breast cancer.  She presents to the emergency room with new left lower extremity ulcers on first and fifth digit.  Onset of symptoms few days prior.  She has previously been seen and evaluated in the past for similar lower extremity ulcers with associated cellulitis.  Her last admission was at [facility] in December 2023.  At that time, patient met SIRS criteria and required admission.  She was treated with vancomycin and cefepime.  She has since been following up with wound care clinic as outpatient.  She presented today with new extremity ulcers and associated pain.  No obvious drainage from wound site.   X-rays obtained showed no bony involvement.  Patient was initiated on IV antibiotics with cefepime and vancomycin.  No obvious leukocytosis or left shift.  ED consulted vascular surgeon Dr. Franchot Gallo who recommended initiating patient on heparin protocol pending evaluation in a.m. for possible angiogram.  This explained to patient and family.  They agree with this plan as detailed.   Assessment and Plan:  # Recurrent left lower extremity cellulitis:  New findings of ulcers on toes and on the heel.  There is also a lesion on anterior shin.  Considering recurrence of these ulcers could be due to PVD vascular surgery consulted, plan is for angiogram when patient is stable.   Continue heparin IV infusion Due to bacteremia vascular surgery will do angiogram most likely on Monday   # Strep pyogenes bacteremia due to left lower extremity cellulitis Started cefazolin 2 g IV every 8 hourly and started linezolid 600  mg IV every 12 hourly Blood culture growing strep pathogen, follow sensitivity report 2/29 Repeat blood cultures collected today a.m. TTE shows LVEF 30 to 35%, moderately decreased function, global hypokinesis with severe hypokinesis of anterior and anterolateral wall.  Severe tricuspid regurgitation, and moderate to severe aortic valve stenosis.  No mention of endocarditis ID consulted for recommendation of antibiotics and duration.   # Atrial fibrillation on anticoagulation with Eliquis.  Continued amiodarone 100 mg p.o. daily home dose Discontinued metoprolol due to low blood pressure Held Eliquis for now as patient is on heparin IV infusion   # Hypotension, patient's blood pressure dropped, patient was not septic. Continue IV fluid for hydration Skipped a dose of metoprolol on 2/28, discontinued metoprolol on 2/29 due to persistent low blood pressure Started midodrine 5 mg p.o. 3 times daily with holding parameters We will continue to monitor BP and titrate medications accordingly  # Hypokalemia, potassium repleted. # Hypomagnesemia, mag repleted. # Hypoglycemia when she was n.p.o.  Hypoglycemia protocol was followed.  Monitor CBG. Monitor electrolytes and replete as needed.   CKD stage IIIa: Stable renal function.   Aneurysmal suprarenal abdominal aorta (3.2 cm) : chronic.    Polymyalgia rheumatic/inflammatory arthritis: Patient is on chronic Plaquenil. Hold Plaquenil for now due to bacteremia, resume on discharge.  CHF: Combined diastolic and systolic: LVEF of 45 to [id number].  Not in acute exacerbation at this time. 2/28 TTE LVEF 30 to 25%, complete report as above.  Conjunctivitis: Ofloxacin eyedrops ordered, use to fresh tear as needed.   Hypothyroidism:  Continue on Synthroid.    Anemia of chronic disease Hb 11.3-8.9-9.1--9.6 Monitor H&H and transfuse if hemoglobin less than 7 Check fecal occult blood to rule out GI bleeding   Body mass index is 27.36 kg/m.   Interventions:      Diet: Regular diet DVT Prophylaxis: Therapeutic Anticoagulation with heparin IV infusion    Advance goals of care discussion: DNR  Family Communication: family was present at bedside, at the time of interview.  The pt provided permission to discuss medical plan with the family. Opportunity was given to ask question and all questions were answered satisfactorily.   Disposition:  Pt is from Home, admitted with left lower extremity cellulitis and PVD, needs vascular surgery evaluation and ID for IV antibiotics, which precludes a safe discharge. Discharge to Home vs SNF TBD, when stable, may need few days to stay..  Subjective: No significant events overnight, pain in the left lower extremity is well-controlled, denies any new complaints, no chest pain or palpitation no shortness of breath.  Patient slept well last night.  Physical Exam: General: NAD, lying comfortably Appear in no distress, affect appropriate Eyes: PERRLA ENT: Oral Mucosa Clear, moist  Neck: no JVD,  Cardiovascular: Irregular rhythm,  Murmur audible due to mod to severe aortic valve stenosis,  Respiratory: good respiratory effort, Bilateral Air entry equal and Decreased, no Crackles, no wheezes Abdomen: Bowel Sound present, Soft and no tenderness,  Skin: no rashes Extremities: no Pedal edema, no calf tenderness, LLE lateral surface wound covered with dressing, blackish discolored wound on the great toe, bluish discoloration of great toe, and lateral surface of the foot near the fifth digit.  Left heel unstageable ulcer Neurologic: without any new focal findings Gait not checked due to patient safety concerns  Vitals:   11/24/22 0808 11/24/22 1612 11/24/22 2314 11/25/22 0845  BP: 105/70 90/65 (!) 101/56 108/65  Pulse: 71 70 69 62  Resp: '16 16 16 14  '$ Temp: (!) 97.4 F (36.3 C) 97.6 F (36.4 C) 98.7 F (37.1 C)   TempSrc:      SpO2: 100% (!) 78% 100% 100%  Weight:      Height:         Intake/Output Summary (Last 24 hours) at 11/25/2022 1544 Last data filed at 11/24/2022 1604 Gross per 24 hour  Intake 1404.17 ml  Output --  Net 1404.17 ml   Filed Weights   11/22/22 2000  Weight: 72.3 kg    Data Reviewed: I have personally reviewed and interpreted daily labs, tele strips, imagings as discussed above. I reviewed all nursing notes, pharmacy notes, vitals, pertinent old records I have discussed plan of care as described above with RN and patient/family.  CBC: Recent Labs  Lab 11/22/22 1612 11/23/22 0321 11/23/22 1519 11/24/22 0807 11/25/22 0313  WBC 7.3 6.7  --  5.8 6.9  HGB 11.3* 8.9* 9.1* 9.1* 9.6*  HCT 37.0 27.2* 28.7* 28.9* 29.9*  MCV 95.1 92.5  --  94.8 94.0  PLT 338 238  --  237 [id number]   Basic Metabolic Panel: Recent Labs  Lab 11/22/22 1612 11/23/22 0321 11/24/22 0807 11/25/22 0313  NA 137 138 137 133*  K 3.8 3.8 3.2* 3.7  CL 104 109 107 105  CO2 22 20* 22 20*  GLUCOSE 74 67* 71 92  BUN 34* 34* 30* 28*  CREATININE 1.24* 1.09* 1.17* 1.12*  CALCIUM 8.5* 7.6* 7.7* 8.0*  MG  --   --  1.0* 1.7  PHOS  --   --  3.3 3.1    Studies: No results found.  Scheduled Meds:  acetaminophen  500 mg Oral TID   amiodarone  100 mg Oral Daily   vitamin C  500 mg Oral BID   bisacodyl  10 mg Oral QHS   calcium carbonate  600 mg of elemental calcium Oral BID WC   feeding supplement  237 mL Oral TID BM   ferrous sulfate  325 mg Oral QODAY   gabapentin  300 mg Oral TID   levothyroxine  50 mcg Oral QAC breakfast   midodrine  5 mg Oral TID WC   multivitamin with minerals  1 tablet Oral Daily   pantoprazole  40 mg Oral BID   polyethylene glycol  17 g Oral Daily   zinc sulfate  220 mg Oral Daily   Continuous Infusions:   ceFAZolin (ANCEF) IV 2 g (11/25/22 0518)   heparin 600 Units/hr (11/24/22 0726)   linezolid (ZYVOX) IV 600 mg (11/25/22 1028)   PRN Meds: acetaminophen **OR** acetaminophen, albuterol, HYDROcodone-acetaminophen, ondansetron (ZOFRAN) IV,  ondansetron, polyvinyl alcohol  Time spent: 35 minutes  Author: Val Riles. MD [facility] 11/25/2022 3:44 PM  To reach On-call, see care teams to locate the attending and reach out to them via [email or web address]. If 7PM-7AM, please contact night-coverage If you still have difficulty reaching the attending provider, please page the [facility] (Director on Call) for [facility] on amion for assistance.

## 2022-11-25 NOTE — Progress Notes (Signed)
Date of Admission:  11/22/2022     ID: Stacy [name] is a 89 y.o. female  Principal Problem:   Cellulitis of left lower extremity Active Problems:   Bacteremia due to Streptococcus   Wound infection    Subjective: Doing better   Medications:   acetaminophen  500 mg Oral TID   amiodarone  100 mg Oral Daily   vitamin C  500 mg Oral BID   bisacodyl  10 mg Oral QHS   calcium carbonate  600 mg of elemental calcium Oral BID WC   feeding supplement  237 mL Oral TID BM   ferrous sulfate  325 mg Oral QODAY   gabapentin  300 mg Oral TID   levothyroxine  50 mcg Oral QAC breakfast   midodrine  5 mg Oral TID WC   multivitamin with minerals  1 tablet Oral Daily   pantoprazole  40 mg Oral BID   polyethylene glycol  17 g Oral Daily   zinc sulfate  220 mg Oral Daily    Objective: Patient Vitals for the past 24 hrs:  BP Temp Pulse Resp SpO2  11/24/22 2314 (!) 101/56 98.7 F (37.1 C) 69 16 100 %  11/24/22 1612 90/65 97.6 F (36.4 C) 70 16 (!) 78 %      PHYSICAL EXAM:  General: Alert, cooperative, no distress, pale Head: Normocephalic, without obvious abnormality, atraumatic. Eyes:rt ectropion Lungs: Clear to auscultation bilaterally. No Wheezing or Rhonchi. No rales. Heart: irregular Abdomen: Soft, non-tender,not distended. Bowel sounds normal. No masses Extremities:left great toe ulcer with eschar- surrounding erythema has reoslved 5th toe ulcer covered with eschar Swelling better 11/25/22   11/25/22    11/23/22         11/23/22   Skin: No rashes or lesions. Or bruising Lymph: Cervical, supraclavicular normal. Neurologic: Grossly non-focal  Lab Results    Latest Ref Rng & Units 11/25/2022    3:13 AM 11/24/2022    8:07 AM 11/23/2022    3:19 PM  CBC  WBC 4.0 - 10.5 K/uL 6.9  5.8    Hemoglobin 12.0 - 15.0 g/dL 9.6  9.1  9.1   Hematocrit 36.0 - 46.0 % 29.9  28.9  28.7   Platelets 150 - 400 K/uL 247  237       Liver Panel    Latest Ref Rng & Units 11/25/2022     3:13 AM 11/24/2022    8:07 AM 11/23/2022    3:21 AM  CMP  Glucose 70 - 99 mg/dL 92  71  67   BUN 8 - 23 mg/dL 28  30  34   Creatinine 0.44 - 1.00 mg/dL 1.12  1.17  1.09   Sodium 135 - 145 mmol/L 133  137  138   Potassium 3.5 - 5.1 mmol/L 3.7  3.2  3.8   Chloride 98 - 111 mmol/L 105  107  109   CO2 22 - 32 mmol/L '20  22  20   '$ Calcium 8.9 - 10.3 mg/dL 8.0  7.7  7.6      Sedimentation Rate Recent Labs    11/22/22 1612  ESRSEDRATE 37*   C-Reactive Protein Recent Labs    11/22/22 1612  CRP 7.2*    Microbiology: 2/28 BC Group A streptococcus WC- staph aureus , gram neg rods 2/29 BC- NG so far Studies/Results: ECHOCARDIOGRAM COMPLETE  Result Date: 11/24/2022    ECHOCARDIOGRAM REPORT   Patient Name:   Stacy [name] Date of Exam: 11/23/2022 Medical  Rec #:  [id number]   Height:       64.0 in Accession #:    [id number]  Weight:       159.4 lb Date of Birth:  07-Jul-1927    BSA:          1.776 m Patient Age:    52 years    BP:           99/59 mmHg Patient Gender: F           HR:           101 bpm. Exam Location:  [facility] Procedure: 2D Echo, Cardiac Doppler and Color Doppler Indications:     R78.81 Bacteremia  History:         Patient has prior history of Echocardiogram examinations, most                  recent 07/04/2022. Risk Factors:Hypertension.  Sonographer:     Cresenciano Lick RDCS Referring Phys:  [id number] Val Riles Diagnosing Phys: Ida Rogue MD IMPRESSIONS  1. Left ventricular ejection fraction, by estimation, is 30 to 35%. The left ventricle has moderately decreased function. The left ventricle demonstrates global hypokinesis with severe hypokinesis of the anterior and anteroseptal wall. There is moderate  left ventricular hypertrophy. Left ventricular diastolic parameters are indeterminate.  2. Right ventricular systolic function is normal. The right ventricular size is normal. There is normal pulmonary artery systolic pressure.  3. Left atrial size was mildly dilated.  4. The  mitral valve is normal in structure. Mild mitral valve regurgitation. No evidence of mitral stenosis. Moderate mitral annular calcification.  5. Tricuspid valve regurgitation is severe.  6. The aortic valve is normal in structure. There is severe calcifcation of the aortic valve. Aortic valve regurgitation is not visualized. Moderate to severe aortic valve stenosis, though degree of stenosis may be underestimated in setting of low EF. Aortic valve area, by VTI measures 0.42 cm. Aortic valve mean gradient measures 18.6 mmHg. Aortic valve Vmax measures 2.76 m/s.  7. There is borderline dilatation of the aortic root, measuring 37 mm.  8. The inferior vena cava is normal in size with greater than 50% respiratory variability, suggesting right atrial pressure of 3 mmHg. FINDINGS  Left Ventricle: Left ventricular ejection fraction, by estimation, is 30 to 35%. The left ventricle has moderately decreased function. The left ventricle demonstrates global hypokinesis. The left ventricular internal cavity size was normal in size. There is moderate left ventricular hypertrophy. Left ventricular diastolic parameters are indeterminate. Right Ventricle: The right ventricular size is normal. No increase in right ventricular wall thickness. Right ventricular systolic function is normal. There is normal pulmonary artery systolic pressure. The tricuspid regurgitant velocity is 2.41 m/s, and  with an assumed right atrial pressure of 5 mmHg, the estimated right ventricular systolic pressure is [id number] mmHg. Left Atrium: Left atrial size was mildly dilated. Right Atrium: Right atrial size was normal in size. Pericardium: There is no evidence of pericardial effusion. Mitral Valve: The mitral valve is normal in structure. Moderate mitral annular calcification. Mild mitral valve regurgitation. No evidence of mitral valve stenosis. Tricuspid Valve: The tricuspid valve is normal in structure. Tricuspid valve regurgitation is severe. No evidence  of tricuspid stenosis. Aortic Valve: The aortic valve is normal in structure. There is severe calcifcation of the aortic valve. Aortic valve regurgitation is not visualized. Moderate to severe aortic stenosis is present. Aortic valve mean gradient measures 18.6 mmHg. Aortic valve peak gradient measures 30.4 mmHg. Aortic  valve area, by VTI measures 0.42 cm. Pulmonic Valve: The pulmonic valve was normal in structure. Pulmonic valve regurgitation is mild. No evidence of pulmonic stenosis. Aorta: The aortic root is normal in size and structure. There is borderline dilatation of the aortic root, measuring 37 mm. Venous: The inferior vena cava is normal in size with greater than 50% respiratory variability, suggesting right atrial pressure of 3 mmHg. IAS/Shunts: No atrial level shunt detected by color flow Doppler.  LEFT VENTRICLE PLAX 2D LVIDd:         4.10 cm LVIDs:         3.50 cm LV PW:         1.20 cm LV IVS:        1.20 cm LVOT diam:     2.00 cm LV SV:         20 LV SV Index:   11 LVOT Area:     3.14 cm  RIGHT VENTRICLE            IVC RV Basal diam:  3.70 cm    IVC diam: 1.10 cm RV S prime:     8.77 cm/s LEFT ATRIUM             Index        RIGHT ATRIUM           Index LA diam:        4.20 cm 2.36 cm/m   RA Area:     26.60 cm LA Vol (A2C):   80.1 ml 45.09 ml/m  RA Volume:   101.00 ml 56.85 ml/m LA Vol (A4C):   62.4 ml 35.13 ml/m LA Biplane Vol: 72.0 ml 40.53 ml/m  AORTIC VALVE AV Area (Vmax):    0.47 cm AV Area (Vmean):   0.43 cm AV Area (VTI):     0.42 cm AV Vmax:           275.80 cm/s AV Vmean:          202.600 cm/s AV VTI:            0.487 m AV Peak Grad:      30.4 mmHg AV Mean Grad:      18.6 mmHg LVOT Vmax:         41.27 cm/s LVOT Vmean:        27.433 cm/s LVOT VTI:          0.064 m LVOT/AV VTI ratio: 0.13  AORTA Ao Root diam: 3.20 cm Ao Asc diam:  3.70 cm MV E velocity: 110.00 cm/s  TRICUSPID VALVE                             TR Peak grad:   23.2 mmHg                             TR Vmax:         241.00 cm/s                              SHUNTS                             Systemic VTI:  0.06 m  Systemic Diam: 2.00 cm Ida Rogue MD Electronically signed by Ida Rogue MD Signature Date/Time: 11/24/2022/7:34:34 AM    Final      Assessment/Plan: Group A streptococcus bacteremia Multiple leg wounds No evidence of necrotizing fascitis On cefazolin+ linezolid  culture from the left foot wound so far staph aureus and gram neg rods- await susceptibility She will need minimum 2 weeks of antibiotic    Multiple skin lesions on the lower extremities- some are discrete circular R/o pemphigoid VS pemphigis with secondary infection Recommend derm as OP if these dont resolve or recur   Afib- on amiodarone/eliquis   Anemia   PMR- pt was  on hydroxycholoquine/prednisone- not any more ? ID will follow her peripherally this weekend- [facility] on call if needed by phone  Discussed the management with care team

## 2022-11-25 NOTE — Plan of Care (Signed)
  Problem: Clinical Measurements: Goal: Diagnostic test results will improve Outcome: Progressing   Problem: Activity: Goal: Risk for activity intolerance will decrease Outcome: Progressing   Problem: Nutrition: Goal: Adequate nutrition will be maintained Outcome: Progressing   Problem: Coping: Goal: Level of anxiety will decrease Outcome: Progressing   Problem: Pain Managment: Goal: General experience of comfort will improve Outcome: Progressing   Problem: Safety: Goal: Ability to remain free from injury will improve Outcome: Progressing   Problem: Skin Integrity: Goal: Risk for impaired skin integrity will decrease Outcome: Progressing

## 2022-11-25 NOTE — Progress Notes (Signed)
Progress Note    11/25/2022 1:34 PM * No surgery date entered *  Subjective:  Stacy [name] is a 89 y.o. female y.o. female with medical history significant of hypertension, fibromyalgia, atrial fibrillation on anticoagulation with Eliquis,AAA, and a history of breast cancer.  She presents to the emergency room with new left lower extremity ulcers on first and fifth digit. She has previously been seen and evaluated in the past for similar lower extremity ulcers with associated cellulitis.     On exam today the patient was sitting up in bed resting comfortably eating breakfast.  She remains very alert this morning.  I was able to have a full conversation with the patient.  Patient family (Son and Daughter) were at the bedside this morning. We discussed her left lower extremity in detail. I answered all questions. We discussed the plan of care. They are in agreement with moving forward with the Angiogram of the left lower extremity next week.  Patient states she feels much better today.  She denies any chest pain or shortness of breath.  She does admit to some resting pain in her left lower extremity.  She remains on antibiotics at this time.  No complaints overnight.  Vitals all remained stable.   Vitals:   11/24/22 2314 11/25/22 0845  BP: (!) 101/56 108/65  Pulse: 69 62  Resp: 16 14  Temp: 98.7 F (37.1 C)   SpO2: 100% 100%   Physical Exam: Cardiac:  Irregular rhythm history of A-fib. Murmur on auscultation related to severe aortic valve stenosis  Lungs:  Lungs clear throughout on auscultation. Diminished in the bases. Normal respiratory effort this morning.  Incisions:  N/A Extremities:  Left lower extremity has noted great toe and fifth digit with black eschar. 2 noted open ulcerations/sores on the left calf. Left heel unstageable ulcer.  Abdomen:  Positive bowel sounds, soft, flat, nontender, nondistended.  Neurologic: AAOx3, neuro intact today, all questions appropriately.   CBC     Component Value Date/Time   WBC 6.9 11/25/2022 0313   RBC 3.18 (L) 11/25/2022 0313   HGB 9.6 (L) 11/25/2022 0313   HGB 11.8 (L) 03/06/2013 1436   HCT 29.9 (L) 11/25/2022 0313   HCT 35.7 03/06/2013 1436   PLT 247 11/25/2022 0313   PLT 249 03/06/2013 1436   MCV 94.0 11/25/2022 0313   MCV 84 03/06/2013 1436   MCH 30.2 11/25/2022 0313   MCHC 32.1 11/25/2022 0313   RDW 22.2 (H) 11/25/2022 0313   RDW 15.0 (H) 03/06/2013 1436   LYMPHSABS 1.0 09/05/2022 1809   LYMPHSABS 2.3 03/06/2013 1436   MONOABS 0.6 09/05/2022 1809   MONOABS 0.9 03/06/2013 1436   EOSABS 0.0 09/05/2022 1809   EOSABS 0.5 03/06/2013 1436   BASOSABS 0.0 09/05/2022 1809   BASOSABS 0.1 03/06/2013 1436    BMET    Component Value Date/Time   NA 133 (L) 11/25/2022 0313   NA 136 (A) 11/14/2022 0000   NA 138 03/06/2013 1436   K 3.7 11/25/2022 0313   K 4.0 03/06/2013 1436   CL 105 11/25/2022 0313   CL 103 03/06/2013 1436   CO2 20 (L) 11/25/2022 0313   CO2 29 03/06/2013 1436   GLUCOSE 92 11/25/2022 0313   GLUCOSE 90 03/06/2013 1436   BUN 28 (H) 11/25/2022 0313   BUN 18 11/14/2022 0000   BUN 18 03/06/2013 1436   CREATININE 1.12 (H) 11/25/2022 0313   CREATININE 0.83 03/06/2013 1436   CALCIUM 8.0 (L) 11/25/2022 [id number]  CALCIUM 9.5 03/06/2013 1436   GFRNONAA 46 (L) 11/25/2022 0313   GFRNONAA >60 03/06/2013 1436   GFRAA >60 03/06/2013 1436    INR    Component Value Date/Time   INR 2.3 (H) 11/22/2022 1612   INR 0.8 05/09/2012 2317     Intake/Output Summary (Last 24 hours) at 11/25/2022 1334 Last data filed at 11/24/2022 1604 Gross per 24 hour  Intake 1524.17 ml  Output --  Net 1524.17 ml     Assessment/Plan:  89 y.o. female  admitted for left lower extremity cellulitis with PVD with eschar to fifth digit and great toe, open left calf ulcers, and unstageable left heel ulcer.   Plan is for vascular surgery take the patient to the vascular lab for left lower extremity angiogram with possible intervention such  as stent placement with angioplasty.  She is in agreement with this plan.  Patient will stay on heparin infusion until then.  Patient will be made n.p.o. Sunday at midnight for procedure Monday.   Discussed the plan with Dr. Leotis Pain he is in agreement with the plan.   DVT prophylaxis:  Heparin Infusion    Drema [facility] 11/25/2022 1:34 PM

## 2022-11-25 NOTE — Plan of Care (Signed)
  Problem: Pain Managment: Goal: General experience of comfort will improve Outcome: Progressing   Problem: Elimination: Goal: Will not experience complications related to bowel motility Outcome: Progressing   Problem: Coping: Goal: Level of anxiety will decrease Outcome: Progressing   Problem: Safety: Goal: Ability to remain free from injury will improve Outcome: Progressing   Problem: Skin Integrity: Goal: Risk for impaired skin integrity will decrease Outcome: Progressing

## 2022-11-25 NOTE — Consult Note (Signed)
ANTICOAGULATION CONSULT NOTE - Follow Up Consult  Pharmacy Consult for Heparin infusion (apixaban PTA) Indication:  limb ischemia  Allergies  Allergen Reactions   Aspirin Itching and Other (See Comments)    Dizzy/passing out   Augmentin [Amoxicillin-Pot Clavulanate]    Codeine Other (See Comments)    dizziness   Doxycycline Hyclate Other (See Comments)   Sulfamethoxazole-Trimethoprim Diarrhea   Tape Itching   Vitamin D Analogs    Azithromycin Rash and Hives    Patient Measurements: Height: '5\' 4"'$  (162.6 cm) Weight: 72.3 kg (159 lb 6.3 oz) IBW/kg (Calculated) : 54.7 Heparin Dosing Weight: 75.3 kg  Vital Signs: Temp: 98.7 F (37.1 C) (02/29 2314) BP: 101/56 (02/29 2314) Pulse Rate: 69 (02/29 2314)  Labs: Recent Labs    11/22/22 1612 11/23/22 0321 11/23/22 1519 11/23/22 2341 11/24/22 0807 11/25/22 0313  HGB 11.3* 8.9* 9.1*  --  9.1* 9.6*  HCT 37.0 27.2* 28.7*  --  28.9* 29.9*  PLT 338 238  --   --  237 247  APTT 53* 155* 86* 71* 96* 98*  LABPROT 24.8*  --   --   --   --   --   INR 2.3*  --   --   --   --   --   HEPARINUNFRC  --   --  >1.10*  --  >1.10* 1.00*  CREATININE 1.24* 1.09*  --   --  1.17* 1.12*     Estimated Creatinine Clearance: 30.6 mL/min (A) (by C-G formula based on SCr of 1.12 mg/dL (H)).  Medications:  PTA: apixaban 2.5 mg BID - last dose 2/27 AM   Assessment: 89 y.o. female past medical history significant for prior breast cancer, hypertension, atrial fibrillation on Eliquis, who presents to the emergency department  from wound clinic. Concern of decreased blood flow to left foot - acute limb ischemia . Pharmacy consulted to manage heparin infusion.   Baseline: aPTT 53, INR 2.3   Goal of Therapy:  Heparin level 0.3-0.7 units/ml aPTT 66-102 seconds Monitor platelets by anticoagulation protocol: Yes  Results:  2/28 @ 0321, aPTT = 166, heparin infusion rate 900 un/hr ->600 un/hr 2/28 @ 1540, aPTT = 86, heparin infusion rate 600 un/hr  (therapeutic x 1) 2/28 @ 2341 aPTT = 71, therapeutic X 2  2/29@ 0837 aPTT=96, therapeutic  3/01@ 0313    aPTT=98,  HL = 1.0  Plan:  3/01 @ 0313:  aPTT = 98,   HL = 1.0 - aPTT is therapeutic X 4 but HL still elevated from Eliquis PTA - will continue pt on current rate and recheck HL and aPTT on     3/02 with AM labs Will use aPTT to guide dosing until correlating with HL  Continue to monitor H&H and platelets   Nixon Sparr D, PharmD Clinical Pharmacist 11/25/2022 4:04 AM

## 2022-11-25 NOTE — TOC Progression Note (Signed)
Transition of Care [facility], [place]) - Progression Note    Patient Details  Name: Stacy [name] MRN: [id number] Date of Birth: [birth year]  Transition of Care [facility]) CM/SW [place], RN Phone Number: 11/25/2022, 2:43 PM  Clinical Narrative:    Patient is scheduled to have Angiogram on Monday, TOC to continue to follow for needs   Expected Discharge Plan: Skilled Nursing Facility Barriers to Discharge: Continued Medical Work up  Expected Discharge Plan and Services       Living arrangements for the past 2 months: Single Family Home                                       Social Determinants of Health (SDOH) Interventions SDOH Screenings   Food Insecurity: No Food Insecurity (11/22/2022)  Housing: Low Risk  (11/22/2022)  Transportation Needs: No Transportation Needs (11/22/2022)  Utilities: Not At Risk (11/22/2022)  Alcohol Screen: Low Risk  (02/04/2019)  Depression (PHQ2-9): Low Risk  (08/30/2022)  Financial Resource Strain: Low Risk  (04/07/2021)  Tobacco Use: Low Risk  (11/22/2022)    Readmission Risk Interventions     No data to display

## 2022-11-26 ENCOUNTER — Inpatient Hospital Stay: Payer: Medicare Other

## 2022-11-26 DIAGNOSIS — L03116 Cellulitis of left lower limb: Secondary | ICD-10-CM | POA: Diagnosis not present

## 2022-11-26 LAB — CBC
HCT: 27.5 % — ABNORMAL LOW (ref 36.0–46.0)
Hemoglobin: 8.8 g/dL — ABNORMAL LOW (ref 12.0–15.0)
MCH: 30.2 pg (ref 26.0–34.0)
MCHC: 32 g/dL (ref 30.0–36.0)
MCV: 94.5 fL (ref 80.0–100.0)
Platelets: 250 10*3/uL (ref 150–400)
RBC: 2.91 MIL/uL — ABNORMAL LOW (ref 3.87–5.11)
RDW: 22.5 % — ABNORMAL HIGH (ref 11.5–15.5)
WBC: 7 10*3/uL (ref 4.0–10.5)
nRBC: 0 % (ref 0.0–0.2)

## 2022-11-26 LAB — PHOSPHORUS: Phosphorus: 2.5 mg/dL (ref 2.5–4.6)

## 2022-11-26 LAB — MAGNESIUM: Magnesium: 1.7 mg/dL (ref 1.7–2.4)

## 2022-11-26 LAB — BASIC METABOLIC PANEL
Anion gap: 8 (ref 5–15)
BUN: 27 mg/dL — ABNORMAL HIGH (ref 8–23)
CO2: 17 mmol/L — ABNORMAL LOW (ref 22–32)
Calcium: 8 mg/dL — ABNORMAL LOW (ref 8.9–10.3)
Chloride: 105 mmol/L (ref 98–111)
Creatinine, Ser: 1.05 mg/dL — ABNORMAL HIGH (ref 0.44–1.00)
GFR, Estimated: 50 mL/min — ABNORMAL LOW (ref >60)
Glucose, Bld: 93 mg/dL (ref 70–99)
Potassium: 3.6 mmol/L (ref 3.5–5.1)
Sodium: 130 mmol/L — ABNORMAL LOW (ref 135–145)

## 2022-11-26 LAB — AEROBIC CULTURE W GRAM STAIN (SUPERFICIAL SPECIMEN)

## 2022-11-26 LAB — GLUCOSE, CAPILLARY
Glucose-Capillary: 103 mg/dL — ABNORMAL HIGH (ref 70–99)
Glucose-Capillary: 106 mg/dL — ABNORMAL HIGH (ref 70–99)
Glucose-Capillary: 128 mg/dL — ABNORMAL HIGH (ref 70–99)
Glucose-Capillary: 71 mg/dL (ref 70–99)
Glucose-Capillary: 75 mg/dL (ref 70–99)
Glucose-Capillary: 84 mg/dL (ref 70–99)

## 2022-11-26 LAB — OSMOLALITY: Osmolality: 283 mOsm/kg (ref 275–295)

## 2022-11-26 LAB — HEPARIN LEVEL (UNFRACTIONATED): Heparin Unfractionated: 0.65 IU/mL (ref 0.30–0.70)

## 2022-11-26 LAB — APTT: aPTT: 102 seconds — ABNORMAL HIGH (ref 24–36)

## 2022-11-26 MED ORDER — SODIUM BICARBONATE 650 MG PO TABS
650.0000 mg | ORAL_TABLET | Freq: Three times a day (TID) | ORAL | Status: AC
  Administered 2022-11-26 (×3): 650 mg via ORAL
  Filled 2022-11-26 (×3): qty 1

## 2022-11-26 MED ORDER — SODIUM CHLORIDE 1 G PO TABS
1.0000 g | ORAL_TABLET | Freq: Three times a day (TID) | ORAL | Status: DC
  Administered 2022-11-26 – 2022-11-27 (×3): 1 g via ORAL
  Filled 2022-11-26 (×3): qty 1

## 2022-11-26 MED ORDER — METOCLOPRAMIDE HCL 5 MG/ML IJ SOLN
5.0000 mg | Freq: Once | INTRAMUSCULAR | Status: AC
  Administered 2022-11-26: 5 mg via INTRAVENOUS
  Filled 2022-11-26: qty 2

## 2022-11-26 NOTE — Progress Notes (Signed)
      Daily Progress Note   Assessment/Planning:   LLE CLI Angio scheduled for Monday Questions answered for patient   Subjective  - * No surgery date entered *   No events overnight   Objective   Vitals:   11/25/22 1720 11/25/22 2357 11/26/22 0740 11/26/22 0856  BP: (!) 101/56 (!) 102/50 97/61 127/75  Pulse: (!) 59 67 68 92  Resp: '16 15 16 16  '$ Temp: (!) 97.4 F (36.3 C) 97.7 F (36.5 C) 97.6 F (36.4 C) 98.2 F (36.8 C)  TempSrc:      SpO2: 97% 96% 100% 97%  Weight:      Height:         Intake/Output Summary (Last 24 hours) at 11/26/2022 0923 Last data filed at 11/26/2022 0719 Gross per 24 hour  Intake 1661.6 ml  Output 200 ml  Net 1461.6 ml    VASC LLE: wounds bandaged    Laboratory   CBC    Latest Ref Rng & Units 11/26/2022    4:57 AM 11/25/2022    3:13 AM 11/24/2022    8:07 AM  CBC  WBC 4.0 - 10.5 K/uL 7.0  6.9  5.8   Hemoglobin 12.0 - 15.0 g/dL 8.8  9.6  9.1   Hematocrit 36.0 - 46.0 % 27.5  29.9  28.9   Platelets 150 - 400 K/uL 250  247  237     BMET    Component Value Date/Time   NA 130 (L) 11/26/2022 0457   NA 136 (A) 11/14/2022 0000   NA 138 03/06/2013 1436   K 3.6 11/26/2022 0457   K 4.0 03/06/2013 1436   CL 105 11/26/2022 0457   CL 103 03/06/2013 1436   CO2 17 (L) 11/26/2022 0457   CO2 29 03/06/2013 1436   GLUCOSE 93 11/26/2022 0457   GLUCOSE 90 03/06/2013 1436   BUN 27 (H) 11/26/2022 0457   BUN 18 11/14/2022 0000   BUN 18 03/06/2013 1436   CREATININE 1.05 (H) 11/26/2022 0457   CREATININE 0.83 03/06/2013 1436   CALCIUM 8.0 (L) 11/26/2022 0457   CALCIUM 9.5 03/06/2013 1436   GFRNONAA 50 (L) 11/26/2022 0457   GFRNONAA >60 03/06/2013 1436   GFRAA >60 03/06/2013 1436     Adele Barthel, MD, FACS, FSVS Covering for  Vascular and Vein Surgery: [phone]  11/26/2022, 9:23 AM

## 2022-11-26 NOTE — H&P (View-Only) (Signed)
      Daily Progress Note   Assessment/Planning:   LLE CLI Angio scheduled for Monday Questions answered for patient   Subjective  - * No surgery date entered *   No events overnight   Objective   Vitals:   11/25/22 1720 11/25/22 2357 11/26/22 0740 11/26/22 0856  BP: (!) 101/56 (!) 102/50 97/61 127/75  Pulse: (!) 59 67 68 92  Resp: '16 15 16 16  '$ Temp: (!) 97.4 F (36.3 C) 97.7 F (36.5 C) 97.6 F (36.4 C) 98.2 F (36.8 C)  TempSrc:      SpO2: 97% 96% 100% 97%  Weight:      Height:         Intake/Output Summary (Last 24 hours) at 11/26/2022 0923 Last data filed at 11/26/2022 0719 Gross per 24 hour  Intake 1661.6 ml  Output 200 ml  Net 1461.6 ml    VASC LLE: wounds bandaged    Laboratory   CBC    Latest Ref Rng & Units 11/26/2022    4:57 AM 11/25/2022    3:13 AM 11/24/2022    8:07 AM  CBC  WBC 4.0 - 10.5 K/uL 7.0  6.9  5.8   Hemoglobin 12.0 - 15.0 g/dL 8.8  9.6  9.1   Hematocrit 36.0 - 46.0 % 27.5  29.9  28.9   Platelets 150 - 400 K/uL 250  247  237     BMET    Component Value Date/Time   NA 130 (L) 11/26/2022 0457   NA 136 (A) 11/14/2022 0000   NA 138 03/06/2013 1436   K 3.6 11/26/2022 0457   K 4.0 03/06/2013 1436   CL 105 11/26/2022 0457   CL 103 03/06/2013 1436   CO2 17 (L) 11/26/2022 0457   CO2 29 03/06/2013 1436   GLUCOSE 93 11/26/2022 0457   GLUCOSE 90 03/06/2013 1436   BUN 27 (H) 11/26/2022 0457   BUN 18 11/14/2022 0000   BUN 18 03/06/2013 1436   CREATININE 1.05 (H) 11/26/2022 0457   CREATININE 0.83 03/06/2013 1436   CALCIUM 8.0 (L) 11/26/2022 0457   CALCIUM 9.5 03/06/2013 1436   GFRNONAA 50 (L) 11/26/2022 0457   GFRNONAA >60 03/06/2013 1436   GFRAA >60 03/06/2013 1436     Adele Barthel, MD, FACS, FSVS Covering for [place] [facility]: [phone]  11/26/2022, 9:23 AM

## 2022-11-26 NOTE — Plan of Care (Signed)

## 2022-11-26 NOTE — Consult Note (Signed)
ANTICOAGULATION CONSULT NOTE - Follow Up Consult  Pharmacy Consult for Heparin infusion (apixaban PTA) Indication:  limb ischemia  Allergies  Allergen Reactions   Aspirin Itching and Other (See Comments)    Dizzy/passing out   Augmentin [Amoxicillin-Pot Clavulanate]    Codeine Other (See Comments)    dizziness   Doxycycline Hyclate Other (See Comments)   Sulfamethoxazole-Trimethoprim Diarrhea   Tape Itching   Vitamin D Analogs    Azithromycin Rash and Hives    Patient Measurements: Height: '5\' 4"'$  (162.6 cm) Weight: 72.3 kg (159 lb 6.3 oz) IBW/kg (Calculated) : 54.7 Heparin Dosing Weight: 75.3 kg  Vital Signs: Temp: 97.7 F (36.5 C) (03/01 2357) BP: 102/50 (03/01 2357) Pulse Rate: 67 (03/01 2357)  Labs: Recent Labs    11/24/22 0807 11/25/22 0313 11/26/22 0457  HGB 9.1* 9.6* 8.8*  HCT 28.9* 29.9* 27.5*  PLT 237 247 250  APTT 96* 98* 102*  HEPARINUNFRC >1.10* 1.00* 0.65  CREATININE 1.17* 1.12* 1.05*     Estimated Creatinine Clearance: 32.6 mL/min (A) (by C-G formula based on SCr of 1.05 mg/dL (H)).  Medications:  PTA: apixaban 2.5 mg BID - last dose 2/27 AM   Assessment: 89 y.o. female past medical history significant for prior breast cancer, hypertension, atrial fibrillation on Eliquis, who presents to the emergency department  from wound clinic. Concern of decreased blood flow to left foot - acute limb ischemia . Pharmacy consulted to manage heparin infusion.   Baseline: aPTT 53, INR 2.3   Goal of Therapy:  Heparin level 0.3-0.7 units/ml aPTT 66-102 seconds Monitor platelets by anticoagulation protocol: Yes  Results:  2/28 @ 0321, aPTT = 166, heparin infusion rate 900 un/hr ->600 un/hr 2/28 @ 1540, aPTT = 86, heparin infusion rate 600 un/hr (therapeutic x 1) 2/28 @ 2341 aPTT = 71, therapeutic X 2  2/29@ 0837 aPTT=96, therapeutic  3/01@ 0313    aPTT=98,  HL = 1.0 3/02@ 0457    aPTT=102, HL = 0.65  Plan:  3/02 @ [id number]:  aPTT = 102, HL = 0.65 - aPTT  therapeutic X 5, now correlating with HL; will use HL to guide dosing from now on.  - Will continue pt on current rate and recheck HL on 3/03 with AM labs.  Continue to monitor H&H and platelets   Stacy [name] D, PharmD Clinical Pharmacist 11/26/2022 6:15 AM

## 2022-11-26 NOTE — Progress Notes (Signed)
[facility] Progress Note  Patient: Stacy [name]    [id number]  DOA: 11/22/2022     Date of Service: the patient was seen and examined on 11/26/2022  Chief Complaint  Patient presents with   Wound Check   Brief hospital course: Stacy [name] is a 89 y.o. female with medical history significant of hypertension, fibromyalgia, atrial fibrillation on anticoagulation with Eliquis,AAA history of breast cancer.  She presents to the emergency room with new left lower extremity ulcers on first and fifth digit.  Onset of symptoms few days prior.  She has previously been seen and evaluated in the past for similar lower extremity ulcers with associated cellulitis.  Her last admission was at [facility] in December 2023.  At that time, patient met SIRS criteria and required admission.  She was treated with vancomycin and cefepime.  She has since been following up with wound care clinic as outpatient.  She presented today with new extremity ulcers and associated pain.  No obvious drainage from wound site.   X-rays obtained showed no bony involvement.  Patient was initiated on IV antibiotics with cefepime and vancomycin.  No obvious leukocytosis or left shift.  ED consulted vascular surgeon Dr. Franchot Gallo who recommended initiating patient on heparin protocol pending evaluation in a.m. for possible angiogram.  This explained to patient and family.  They agree with this plan as detailed.   Assessment and Plan:  # Recurrent left lower extremity cellulitis due to severe PVD New findings of ulcers on toes and on the heel.  There is also a lesion on anterior shin.  Considering recurrence of these ulcers could be due to PVD vascular surgery consulted, plan is for angiogram when patient is stable.   Continue heparin IV infusion Due to bacteremia vascular surgery will do angiogram most likely on Monday   # Strep pyogenes bacteremia due to left lower extremity cellulitis Started cefazolin 2 g IV every 8 hourly and  started linezolid 600 mg IV every 12 hourly Blood culture growing strep pathogen, follow sensitivity report 2/29 Repeat blood cultures collected today a.m. TTE shows LVEF 30 to 35%, moderately decreased function, global hypokinesis with severe hypokinesis of anterior and anterolateral wall.  Severe tricuspid regurgitation, and moderate to severe aortic valve stenosis.  No mention of endocarditis ID consulted for recommendation of antibiotics and duration.   # Atrial fibrillation on anticoagulation with Eliquis.  Continued amiodarone 100 mg p.o. daily home dose Discontinued metoprolol due to low blood pressure Held Eliquis for now as patient is on heparin IV infusion   # Hypotension, patient's blood pressure dropped, patient was not septic. Continue IV fluid for hydration Skipped a dose of metoprolol on 2/28, discontinued metoprolol on 2/29 due to persistent low blood pressure Started midodrine 5 mg p.o. 3 times daily with holding parameters We will continue to monitor BP and titrate medications accordingly  # Hypokalemia, potassium repleted. # Hypomagnesemia, mag repleted. # Hypoglycemia when she was n.p.o.  Hypoglycemia protocol was followed.  Monitor CBG. Monitor electrolytes and replete as needed.   CKD stage IIIa: Stable renal function.   Aneurysmal suprarenal abdominal aorta (3.2 cm) : chronic.    Polymyalgia rheumatic/inflammatory arthritis: Patient is on chronic Plaquenil. Hold Plaquenil for now due to bacteremia, resume on discharge.  CHF: Combined diastolic and systolic: LVEF of 45 to [id number].  Not in acute exacerbation at this time. 2/28 TTE LVEF 30 to 25%, complete report as above.  Conjunctivitis: Ofloxacin eyedrops ordered, use to fresh tear as needed.  Hypothyroidism: Continue on Synthroid.    Anemia of chronic disease Hb 11.3-8.9-9.1--9.6 Monitor H&H and transfuse if hemoglobin less than 7 Check fecal occult blood to rule out GI bleeding   Body mass index is  27.36 kg/m.  Interventions:      Diet: Regular diet DVT Prophylaxis: Therapeutic Anticoagulation with heparin IV infusion    Advance goals of care discussion: DNR  Family Communication: family was present at bedside, at the time of interview.  The pt provided permission to discuss medical plan with the family. Opportunity was given to ask question and all questions were answered satisfactorily.   Disposition:  Pt is from Home, admitted with left lower extremity cellulitis and PVD, vascular surgery recommended angiogram on Monday and ID rec IV antibiotics, which precludes a safe discharge. Discharge to Home vs SNF TBD, when stable, may need few days to stay. Palliative care consulted for goals of care discussion with family.   Subjective: No significant events overnight, complaining of developing blisters on the left toe which is very painful.  Patient was also having vomiting most likely after taking the medications.  Patient is tolerating diet well otherwise.  Patient is moving bowels 2-3 episodes yesterday.  Denies any abdominal pain.  No chest pain or palpitation, no shortness of breath.   Physical Exam: General: NAD, lying comfortably Appear in no distress, affect appropriate Eyes: PERRLA ENT: Oral Mucosa Clear, moist  Neck: no JVD,  Cardiovascular: Irregular rhythm,  Murmur audible due to mod to severe aortic valve stenosis,  Respiratory: good respiratory effort, Bilateral Air entry equal and Decreased, no Crackles, no wheezes Abdomen: Bowel Sound present, Soft and no tenderness,  Skin: no rashes Extremities: no Pedal edema, no calf tenderness, LLE lateral surface wound covered with dressing, blackish discolored wound on the great toe, bluish discoloration of great toe, and lateral surface of the foot near the fifth digit.  Left heel unstageable ulcer, blister on the left great toe which is new on 3/2 Neurologic: without any new focal findings Gait not checked due to patient  safety concerns  Vitals:   11/25/22 1720 11/25/22 2357 11/26/22 0740 11/26/22 0856  BP: (!) 101/56 (!) 102/50 97/61 127/75  Pulse: (!) 59 67 68 92  Resp: '16 15 16 16  '$ Temp: (!) 97.4 F (36.3 C) 97.7 F (36.5 C) 97.6 F (36.4 C) 98.2 F (36.8 C)  TempSrc:      SpO2: 97% 96% 100% 97%  Weight:      Height:        Intake/Output Summary (Last 24 hours) at 11/26/2022 1202 Last data filed at 11/26/2022 0719 Gross per 24 hour  Intake 1424.6 ml  Output 200 ml  Net 1224.6 ml   Filed Weights   11/22/22 2000  Weight: 72.3 kg    Data Reviewed: I have personally reviewed and interpreted daily labs, tele strips, imagings as discussed above. I reviewed all nursing notes, pharmacy notes, vitals, pertinent old records I have discussed plan of care as described above with RN and patient/family.  CBC: Recent Labs  Lab 11/22/22 1612 11/23/22 0321 11/23/22 1519 11/24/22 0807 11/25/22 0313 11/26/22 0457  WBC 7.3 6.7  --  5.8 6.9 7.0  HGB 11.3* 8.9* 9.1* 9.1* 9.6* 8.8*  HCT 37.0 27.2* 28.7* 28.9* 29.9* 27.5*  MCV 95.1 92.5  --  94.8 94.0 94.5  PLT 338 238  --  237 247 [id number]   Basic Metabolic Panel: Recent Labs  Lab 11/22/22 1612 11/23/22 0321 11/24/22 [id number] 11/25/22 [id number]  11/26/22 0457  NA 137 138 137 133* 130*  K 3.8 3.8 3.2* 3.7 3.6  CL 104 109 107 105 105  CO2 22 20* 22 20* 17*  GLUCOSE 74 67* 71 92 93  BUN 34* 34* 30* 28* 27*  CREATININE 1.24* 1.09* 1.17* 1.12* 1.05*  CALCIUM 8.5* 7.6* 7.7* 8.0* 8.0*  MG  --   --  1.0* 1.7 1.7  PHOS  --   --  3.3 3.1 2.5    Studies: DG Abd 1 View  Result Date: 11/26/2022 CLINICAL DATA:  Small-bowel obstruction, patient reports bowel movement EXAM: ABDOMEN - 1 VIEW COMPARISON:  None Available. FINDINGS: Nonobstructive pattern of bowel gas scattered gas present to the descending colon. No significant burden of stool. No free air in the abdomen on supine radiographs. IMPRESSION: Nonobstructive pattern of bowel gas with scattered gas present  to the descending colon. No significant burden of stool. Electronically Signed   By: Delanna Ahmadi M.D.   On: 11/26/2022 10:44    Scheduled Meds:  acetaminophen  500 mg Oral TID   amiodarone  100 mg Oral Daily   vitamin C  500 mg Oral BID   bisacodyl  10 mg Oral QHS   calcium carbonate  600 mg of elemental calcium Oral BID WC   feeding supplement  237 mL Oral TID BM   ferrous sulfate  325 mg Oral QODAY   gabapentin  300 mg Oral TID   levothyroxine  50 mcg Oral QAC breakfast   midodrine  5 mg Oral TID WC   multivitamin with minerals  1 tablet Oral Daily   pantoprazole  40 mg Oral BID   polyethylene glycol  17 g Oral Daily   sodium bicarbonate  650 mg Oral TID   sodium chloride  1 g Oral TID WC   zinc sulfate  220 mg Oral Daily   Continuous Infusions:   ceFAZolin (ANCEF) IV 2 g (11/26/22 0541)   heparin 600 Units/hr (11/26/22 0354)   linezolid (ZYVOX) IV 600 mg (11/26/22 0909)   PRN Meds: acetaminophen **OR** acetaminophen, albuterol, HYDROcodone-acetaminophen, ondansetron (ZOFRAN) IV, ondansetron, polyvinyl alcohol  Time spent: 35 minutes  Author: Val Riles. MD [facility] 11/26/2022 12:02 PM  To reach On-call, see care teams to locate the attending and reach out to them via [email or web address]. If 7PM-7AM, please contact night-coverage If you still have difficulty reaching the attending provider, please page the [facility] (Director on Call) for [facility] on amion for assistance.

## 2022-11-27 DIAGNOSIS — L03116 Cellulitis of left lower limb: Secondary | ICD-10-CM | POA: Diagnosis not present

## 2022-11-27 LAB — GLUCOSE, CAPILLARY
Glucose-Capillary: 75 mg/dL (ref 70–99)
Glucose-Capillary: 76 mg/dL (ref 70–99)
Glucose-Capillary: 66 mg/dL — ABNORMAL LOW (ref 70–99)
Glucose-Capillary: 74 mg/dL (ref 70–99)
Glucose-Capillary: 91 mg/dL (ref 70–99)
Glucose-Capillary: 89 mg/dL (ref 70–99)
Glucose-Capillary: 83 mg/dL (ref 70–99)

## 2022-11-27 LAB — CBC
HCT: 27.6 % — ABNORMAL LOW (ref 36.0–46.0)
Hemoglobin: 9 g/dL — ABNORMAL LOW (ref 12.0–15.0)
MCH: 30.1 pg (ref 26.0–34.0)
MCHC: 32.6 g/dL (ref 30.0–36.0)
MCV: 92.3 fL (ref 80.0–100.0)
Platelets: 228 10*3/uL (ref 150–400)
RBC: 2.99 MIL/uL — ABNORMAL LOW (ref 3.87–5.11)
RDW: 22.3 % — ABNORMAL HIGH (ref 11.5–15.5)
WBC: 7.3 10*3/uL (ref 4.0–10.5)
nRBC: 0 % (ref 0.0–0.2)

## 2022-11-27 LAB — BASIC METABOLIC PANEL
Anion gap: 8 (ref 5–15)
BUN: 23 mg/dL (ref 8–23)
CO2: 21 mmol/L — ABNORMAL LOW (ref 22–32)
Calcium: 8 mg/dL — ABNORMAL LOW (ref 8.9–10.3)
Chloride: 99 mmol/L (ref 98–111)
Creatinine, Ser: 1.24 mg/dL — ABNORMAL HIGH (ref 0.44–1.00)
GFR, Estimated: 41 mL/min — ABNORMAL LOW (ref >60)
Glucose, Bld: 159 mg/dL — ABNORMAL HIGH (ref 70–99)
Potassium: 3.6 mmol/L (ref 3.5–5.1)
Sodium: 128 mmol/L — ABNORMAL LOW (ref 135–145)

## 2022-11-27 LAB — CULTURE, BLOOD (ROUTINE X 2)
Culture: NO GROWTH
Special Requests: ADEQUATE

## 2022-11-27 LAB — MAGNESIUM: Magnesium: 1.5 mg/dL — ABNORMAL LOW (ref 1.7–2.4)

## 2022-11-27 LAB — HEPARIN LEVEL (UNFRACTIONATED)
Heparin Unfractionated: 0.22 IU/mL — ABNORMAL LOW (ref 0.30–0.70)
Heparin Unfractionated: 0.26 IU/mL — ABNORMAL LOW (ref 0.30–0.70)

## 2022-11-27 LAB — BRAIN NATRIURETIC PEPTIDE: B Natriuretic Peptide: 562.7 pg/mL — ABNORMAL HIGH (ref 0.0–100.0)

## 2022-11-27 LAB — PHOSPHORUS: Phosphorus: 2.6 mg/dL (ref 2.5–4.6)

## 2022-11-27 MED ORDER — SODIUM BICARBONATE 650 MG PO TABS
650.0000 mg | ORAL_TABLET | Freq: Three times a day (TID) | ORAL | Status: AC
  Administered 2022-11-27 (×2): 650 mg via ORAL
  Filled 2022-11-27 (×2): qty 1

## 2022-11-27 MED ORDER — HEPARIN BOLUS VIA INFUSION
1000.0000 [IU] | Freq: Once | INTRAVENOUS | Status: AC
  Administered 2022-11-27: 1000 [IU] via INTRAVENOUS
  Filled 2022-11-27: qty 1000

## 2022-11-27 MED ORDER — SODIUM CHLORIDE 1 G PO TABS
2.0000 g | ORAL_TABLET | Freq: Three times a day (TID) | ORAL | Status: AC
  Administered 2022-11-27 – 2022-11-30 (×8): 2 g via ORAL
  Filled 2022-11-27 (×8): qty 2

## 2022-11-27 MED ORDER — MAGNESIUM SULFATE 4 GM/100ML IV SOLN
4.0000 g | Freq: Once | INTRAVENOUS | Status: AC
  Administered 2022-11-27: 4 g via INTRAVENOUS
  Filled 2022-11-27: qty 100

## 2022-11-27 MED ORDER — SODIUM CHLORIDE 0.9 % IV SOLN: INTRAVENOUS | Status: DC

## 2022-11-27 MED ORDER — BACITRACIN-POLYMYXIN B 500-10000 UNIT/GM OP OINT
TOPICAL_OINTMENT | Freq: Two times a day (BID) | OPHTHALMIC | Status: DC
  Filled 2022-11-27 (×2): qty 3.5

## 2022-11-27 MED ORDER — CEFAZOLIN SODIUM-DEXTROSE 2-4 GM/100ML-% IV SOLN
2.0000 g | Freq: Two times a day (BID) | INTRAVENOUS | Status: DC
  Administered 2022-11-27 – 2022-11-28 (×2): 2 g via INTRAVENOUS
  Filled 2022-11-27 (×2): qty 100

## 2022-11-27 MED ORDER — GABAPENTIN 100 MG PO CAPS
200.0000 mg | ORAL_CAPSULE | Freq: Three times a day (TID) | ORAL | Status: DC
  Administered 2022-11-27 – 2022-12-02 (×17): 200 mg via ORAL
  Filled 2022-11-27 (×17): qty 2

## 2022-11-27 NOTE — Progress Notes (Signed)
RAKYA, GIAMBATTISTA B ([id number]) [id number] Page 1 of 7 Visit Report for 11/22/2022 Chief Complaint Document Details Patient Name: Date of Service: [name], [name] 11/22/2022 1:30 PM Medical Record Number: [id number] Patient Account Number: [id number] Date of Birth/Sex: Treating RN: 1927-01-08 (89 y.o. Orvan Falconer Primary Care Provider: Dewayne Shorter Other Clinician: Referring Provider: Treating Provider/Extender: Alfonzo Beers Weeks in Treatment: 0 Information Obtained from: Patient Chief Complaint Left foot ulcers Electronic Signature(s) Signed: 11/22/2022 2:19:45 PM By: Worthy Keeler PA-C Entered By: Worthy Keeler on 11/22/2022 14:19:45 -------------------------------------------------------------------------------- HPI Details Patient Name: Date of Service: [name], [name] 11/22/2022 1:30 PM Medical Record Number: [id number] Patient Account Number: [id number] Date of Birth/Sex: Treating RN: June 09, 1927 (89 y.o. Orvan Falconer Primary Care Provider: Dewayne Shorter Other Clinician: Referring Provider: Treating Provider/Extender: Alfonzo Beers Weeks in Treatment: 0 History of Present Illness HPI Description: 11-22-2022 upon evaluation today patient appears to be doing poorly upon initial evaluation here in the clinic unfortunately. She has an issue here that appears to be a limb threatening ischemia to the left lower extremity. She has 3 wounds noted 1 on the heel 1 on the lateral portion of her foot and 1 on the great toe on the dorsal surface. All 3 look to be necrotic the dorsal surface of the toe does seem to probe down to bone and I am concerned that she may not keep the toe in a matter of the situation. Nonetheless I think her arterial follow-up is severely compromised her toes are cool and purple with in appearance. She also has very slow capillary refill. Subsequently I think that she needs to go to the ER for further  evaluation and treatment. Electronic Signature(s) Signed: 11/22/2022 2:55:01 PM By: Worthy Keeler PA-C Entered By: Worthy Keeler on 11/22/2022 14:55:01 Tarrant, Sibley B ([id number]) [id number] Page 2 of 7 -------------------------------------------------------------------------------- Physical Exam Details Patient Name: Date of Service: [name], [name] 11/22/2022 1:30 PM Medical Record Number: [id number] Patient Account Number: [id number] Date of Birth/Sex: Treating RN: 29-Dec-1926 (89 y.o. Orvan Falconer Primary Care Provider: Dewayne Shorter Other Clinician: Referring Provider: Treating Provider/Extender: Alfonzo Beers Weeks in Treatment: 0 Constitutional sitting or standing blood pressure is within target range for patient.. pulse regular and within target range for patient.Marland Kitchen respirations regular, non-labored and within target range for patient.Marland Kitchen temperature within target range for patient.. Well-nourished and well-hydrated in no acute distress. Eyes conjunctiva clear no eyelid edema noted. pupils equal round and reactive to light and accommodation. Ears, Nose, Mouth, and Throat no gross abnormality of ear auricles or external auditory canals. normal hearing noted during conversation. mucus membranes moist. Respiratory normal breathing without difficulty. Cardiovascular Absent posterior tibial and dorsalis pedis pulses bilateral lower extremities. 1+ pitting edema of the bilateral lower extremities. Musculoskeletal normal gait and posture. no significant deformity or arthritic changes, no loss or range of motion, no clubbing. Psychiatric this patient is able to make decisions and demonstrates good insight into disease process. Alert and Oriented x 3. pleasant and cooperative. Notes Upon inspection patient's wound again appears to be fairly significant she has a lot of necrotic eschar noted on the lateral foot as well as the heel. She  does also have some eschar and necrotic necrosis noted on the dorsal surface of the great toe but again the main issue here simply is that she also probes down to bone there is probably a significant chance of infection on top of  everything else. I think that she needs to get to the ER as soon as possible for further evaluation and treatment. Will get a see about getting her transported through the transport with [facility]. Electronic Signature(s) Signed: 11/22/2022 2:56:15 PM By: Worthy Keeler PA-C Entered By: Worthy Keeler on 11/22/2022 14:56:14 -------------------------------------------------------------------------------- Physician Orders Details Patient Name: Date of Service: [name], [name] 11/22/2022 1:30 PM Medical Record Number: [id number] Patient Account Number: [id number] Date of Birth/Sex: Treating RN: 16-Aug-1927 (89 y.o. Orvan Falconer Primary Care Provider: Dewayne Shorter Other Clinician: Referring Provider: Treating Provider/Extender: [name] in Treatment: 0 Verbal / Phone Orders: No LETOYA, VILLALVA ([id number]) [id number] Page 3 of 7 Diagnosis Coding ICD-10 Coding Code Description I73.89 Other specified peripheral vascular diseases L97.522 Non-pressure chronic ulcer of other part of left foot with fat layer exposed L97.528 Non-pressure chronic ulcer of other part of left foot with other specified severity I48.0 Paroxysmal atrial fibrillation Z79.01 Long term (current) use of anticoagulants N18.31 Chronic kidney disease, stage 3a I10 Essential (primary) hypertension I50.42 Chronic combined systolic (congestive) and diastolic (congestive) heart failure I71.40 Abdominal aortic aneurysm, without rupture, unspecified J44.9 Chronic obstructive pulmonary disease, unspecified Follow-up Appointments Other: - send to ED for eval, wrap left foot with gauze Wound Treatment Notes patient to go to emergency room for eval  for life threating ischemia of left lower leg Electronic Signature(s) Signed: 11/22/2022 4:06:13 PM By: Carlene Coria RN Signed: 11/24/2022 5:15:32 PM By: Worthy Keeler PA-C Entered By: Carlene Coria on 11/22/2022 16:06:13 -------------------------------------------------------------------------------- Problem List Details Patient Name: Date of Service: [name], [name] 11/22/2022 1:30 PM Medical Record Number: [id number] Patient Account Number: [id number] Date of Birth/Sex: Treating RN: [birth year] (89 y.o. Orvan Falconer Primary Care Provider: Dewayne Shorter Other Clinician: Referring Provider: Treating Provider/Extender: Alfonzo Beers Weeks in Treatment: 0 Active Problems ICD-10 Encounter Code Description Active Date MDM Diagnosis I73.89 Other specified peripheral vascular diseases 11/22/2022 No Yes L97.522 Non-pressure chronic ulcer of other part of left foot with fat layer exposed 11/22/2022 No Yes L97.528 Non-pressure chronic ulcer of other part of left foot with other specified 11/22/2022 No Yes severity I48.0 Paroxysmal atrial fibrillation 11/22/2022 No Yes ANGELETTE, ROTHWELL B ([id number]) [id number] Page 4 of 7 Z79.01 Long term (current) use of anticoagulants 11/22/2022 No Yes N18.31 Chronic kidney disease, stage 3a 11/22/2022 No Yes I10 Essential (primary) hypertension 11/22/2022 No Yes I50.42 Chronic combined systolic (congestive) and diastolic (congestive) heart failure 11/22/2022 No Yes I71.40 Abdominal aortic aneurysm, without rupture, unspecified 11/22/2022 No Yes J44.9 Chronic obstructive pulmonary disease, unspecified 11/22/2022 No Yes Inactive Problems Resolved Problems Electronic Signature(s) Signed: 11/22/2022 2:19:34 PM By: Worthy Keeler PA-C Entered By: Worthy Keeler on 11/22/2022 14:19:34 -------------------------------------------------------------------------------- Progress Note Details Patient Name: Date of Service: [name],  [name] 11/22/2022 1:30 PM Medical Record Number: [id number] Patient Account Number: [id number] Date of Birth/Sex: Treating RN: 01/06/1927 (89 y.o. Orvan Falconer Primary Care Provider: Dewayne Shorter Other Clinician: Referring Provider: Treating Provider/Extender: Alfonzo Beers Weeks in Treatment: 0 Subjective Chief Complaint Information obtained from Patient Left foot ulcers History of Present Illness (HPI) 11-22-2022 upon evaluation today patient appears to be doing poorly upon initial evaluation here in the clinic unfortunately. She has an issue here that appears to be a limb threatening ischemia to the left lower extremity. She has 3 wounds noted 1 on the heel 1 on the lateral portion of her foot and 1 on the  great toe on the dorsal surface. All 3 look to be necrotic the dorsal surface of the toe does seem to probe down to bone and I am concerned that she may not keep the toe in a matter of the situation. Nonetheless I think her arterial follow-up is severely compromised her toes are cool and purple with in appearance. She also has very slow capillary refill. Subsequently I think that she needs to go to the ER for further evaluation and treatment. Patient History Allergies aspirin, Augmentin, codeine, doxycycline HCl, Sulfa (Sulfonamide Antibiotics), Paper Tape, azithromycin, Vitamin D2 TERRYE, UHL B ([id number]) [id number] Page 5 of 7 Social History Never smoker, Marital Status - Widowed, Alcohol Use - Never, Drug Use - No History, Caffeine Use - Rarely. Medical History Respiratory Patient has history of Chronic Obstructive Pulmonary Disease (COPD) Cardiovascular Patient has history of Arrhythmia, Congestive Heart Failure, Hypertension Oncologic Patient has history of Received Radiation - 10 years ago Review of Systems (ROS) Oncologic breast ca Objective Constitutional sitting or standing blood pressure is within target range for  patient.. pulse regular and within target range for patient.Marland Kitchen respirations regular, non-labored and within target range for patient.Marland Kitchen temperature within target range for patient.. Well-nourished and well-hydrated in no acute distress. Vitals Time Taken: 1:45 PM, Height: 64 in, Source: Stated, Weight: 150 lbs, Source: Stated, BMI: 25.7, Temperature: 98.1 F, Pulse: 76 bpm, Respiratory Rate: 18 breaths/min, Blood Pressure: 131/78 mmHg. Eyes conjunctiva clear no eyelid edema noted. pupils equal round and reactive to light and accommodation. Ears, Nose, Mouth, and Throat no gross abnormality of ear auricles or external auditory canals. normal hearing noted during conversation. mucus membranes moist. Respiratory normal breathing without difficulty. Cardiovascular Absent posterior tibial and dorsalis pedis pulses bilateral lower extremities. 1+ pitting edema of the bilateral lower extremities. Musculoskeletal normal gait and posture. no significant deformity or arthritic changes, no loss or range of motion, no clubbing. Psychiatric this patient is able to make decisions and demonstrates good insight into disease process. Alert and Oriented x 3. pleasant and cooperative. General Notes: Upon inspection patient's wound again appears to be fairly significant she has a lot of necrotic eschar noted on the lateral foot as well as the heel. She does also have some eschar and necrotic necrosis noted on the dorsal surface of the great toe but again the main issue here simply is that she also probes down to bone there is probably a significant chance of infection on top of everything else. I think that she needs to get to the ER as soon as possible for further evaluation and treatment. Will get a see about getting her transported through the transport with [facility]. Integumentary (Hair, Skin) Wound #1 status is Open. Original cause of wound was Gradually Appeared. The date acquired was: 10/27/2022. The wound is  located on the Left T Great. The oe wound measures 3cm length x 3cm width x 0.4cm depth; 7.069cm^2 area and 2.827cm^3 volume. There is no tunneling or undermining noted. There is a medium amount of serosanguineous drainage noted. There is small (1-33%) red granulation within the wound bed. There is a large (67-100%) amount of necrotic tissue within the wound bed including Adherent Slough. Wound #2 status is Open. Original cause of wound was Gradually Appeared. The date acquired was: 10/27/2022. The wound is located on the Left Calcaneus. The wound measures 3cm length x 3.5cm width x 0.3cm depth; 8.247cm^2 area and 2.474cm^3 volume. There is Fat Layer (Subcutaneous Tissue) exposed. There is no tunneling or undermining  noted. There is a medium amount of serosanguineous drainage noted. There is small (1-33%) red granulation within the wound bed. There is a large (67-100%) amount of necrotic tissue within the wound bed including Eschar and Adherent Slough. Wound #3 status is Open. Original cause of wound was Gradually Appeared. The date acquired was: 11/15/2022. The wound is located on the Left Metatarsal head fifth. The wound measures 2.2cm length x 3.5cm width x 0.2cm depth; 6.048cm^2 area and 1.21cm^3 volume. There is no tunneling or undermining noted. There is a medium amount of serosanguineous drainage noted. There is no granulation within the wound bed. There is a large (67-100%) amount of necrotic tissue within the wound bed including Eschar and Adherent Slough. Assessment Active Problems ICD-10 Other specified peripheral vascular diseases Non-pressure chronic ulcer of other part of left foot with fat layer exposed Non-pressure chronic ulcer of other part of left foot with other specified severity Paroxysmal atrial fibrillation Long term (current) use of anticoagulants Chronic kidney disease, stage 3a Essential (primary) hypertension Chronic combined systolic (congestive) and diastolic  (congestive) heart failure Abdominal aortic aneurysm, without rupture, unspecified Nowling, Kamalei B ([id number]) [id number] Page 6 of 7 Chronic obstructive pulmonary disease, unspecified Plan General Notes: patient to go to emergency room for eval for life threating ischemia of left lower leg 1. I am going to recommend currently that the patient go to the ER for further evaluation and treatment. This needs to happen as quickly as possible. I am in a go ahead and send a copy of the note as well and I will also call the triage nurse and try to get in touch with them. I have alerted the nurse at [facility] as well of my concerns about the patient and what needs to be done. 2. What is going to put a dressing on to get her to the ER for further evaluation and treatment nothing specific at this point. 3. I am also can recommend that we will see her back for evaluation when she gets out of the hospital depending on what occurs in the direction that this course takes. I do believe she is at high risk for them loss and I think limb salvage is going to be the way things are going here at this point. We will see the patient back for follow-up visit as needed. Electronic Signature(s) Signed: 11/22/2022 2:57:18 PM By: Worthy Keeler PA-C Entered By: Worthy Keeler on 11/22/2022 14:57:17 -------------------------------------------------------------------------------- ROS/PFSH Details Patient Name: Date of Service: [name], [name] 11/22/2022 1:30 PM Medical Record Number: [id number] Patient Account Number: [id number] Date of Birth/Sex: Treating RN: 30-Jul-1927 (89 y.o. Orvan Falconer Primary Care Provider: Dewayne Shorter Other Clinician: Referring Provider: Treating Provider/Extender: [name] in Treatment: 0 Respiratory Medical History: Positive for: Chronic Obstructive Pulmonary Disease (COPD) Cardiovascular Medical History: Positive for:  Arrhythmia; Congestive Heart Failure; Hypertension Oncologic Complaints and Symptoms: Review of System Notes: breast ca Medical History: Positive for: Received Radiation - 10 years ago Immunizations Pneumococcal Vaccine: Received Pneumococcal Vaccination: No Implantable Devices None DELORISE, ISENHOWER B ([id number]) [id number] Page 7 of 7 Family and Social History Never smoker; Marital Status - Widowed; Alcohol Use: Never; Drug Use: No History; Caffeine Use: Rarely Electronic Signature(s) Signed: 11/23/2022 11:54:24 AM By: Carlene Coria RN Signed: 11/24/2022 5:15:32 PM By: Worthy Keeler PA-C Entered By: Carlene Coria on 11/22/2022 13:53:21 -------------------------------------------------------------------------------- SuperBill Details Patient Name: Date of Service: [name], [name] 11/22/2022 Medical Record Number: [id number] Patient  Account Number: [id number] Date of Birth/Sex: Treating RN: [birth year] (89 y.o. Orvan Falconer Primary Care Provider: Dewayne Shorter Other Clinician: Referring Provider: Treating Provider/Extender: Temple Pacini, Jordan Hawks Weeks in Treatment: 0 Diagnosis Coding ICD-10 Codes Code Description I73.89 Other specified peripheral vascular diseases L97.522 Non-pressure chronic ulcer of other part of left foot with fat layer exposed L97.528 Non-pressure chronic ulcer of other part of left foot with other specified severity I48.0 Paroxysmal atrial fibrillation Z79.01 Long term (current) use of anticoagulants N18.31 Chronic kidney disease, stage 3a I10 Essential (primary) hypertension I50.42 Chronic combined systolic (congestive) and diastolic (congestive) heart failure I71.40 Abdominal aortic aneurysm, without rupture, unspecified J44.9 Chronic obstructive pulmonary disease, unspecified Facility Procedures : CPT4 Code: [id number] Description: [id number] - WOUND CARE VISIT-LEV 5 EST PT Modifier: Quantity: 1 Physician Procedures :  CPT4 Code Description Modifier [id number] - WC PHYS LEVEL 5 - EST PT ICD-10 Diagnosis Description I73.89 Other specified peripheral vascular diseases L97.522 Non-pressure chronic ulcer of other part of left foot with fat layer exposed L97.528  Non-pressure chronic ulcer of other part of left foot with other specified severity I48.0 Paroxysmal atrial fibrillation Quantity: 1 Electronic Signature(s) Signed: 11/22/2022 2:57:39 PM By: Worthy Keeler PA-C Entered By: Worthy Keeler on 11/22/2022 14:57:39

## 2022-11-27 NOTE — Consult Note (Signed)
ANTICOAGULATION CONSULT NOTE - Follow Up Consult  Pharmacy Consult for Heparin infusion (apixaban PTA) Indication:  limb ischemia  Allergies  Allergen Reactions   Aspirin Itching and Other (See Comments)    Dizzy/passing out   Augmentin [Amoxicillin-Pot Clavulanate]    Codeine Other (See Comments)    dizziness   Doxycycline Hyclate Other (See Comments)   Sulfamethoxazole-Trimethoprim Diarrhea   Tape Itching   Vitamin D Analogs    Azithromycin Rash and Hives    Patient Measurements: Height: '5\' 4"'$  (162.6 cm) Weight: 72.3 kg (159 lb 6.3 oz) IBW/kg (Calculated) : 54.7 Heparin Dosing Weight: 75.3 kg  Vital Signs: Temp: 98 F (36.7 C) (03/02 2100) Temp Source: Oral (03/02 2100) BP: 116/66 (03/02 2100) Pulse Rate: 66 (03/02 2100)  Labs: Recent Labs    11/24/22 0807 11/25/22 0313 11/26/22 0457 11/27/22 0621  HGB 9.1* 9.6* 8.8* 9.0*  HCT 28.9* 29.9* 27.5* 27.6*  PLT 237 247 250 228  APTT 96* 98* 102*  --   HEPARINUNFRC >1.10* 1.00* 0.65 0.22*  CREATININE 1.17* 1.12* 1.05* 1.24*     Estimated Creatinine Clearance: 27.6 mL/min (A) (by C-G formula based on SCr of 1.24 mg/dL (H)).  Medications:  PTA: apixaban 2.5 mg BID - last dose 2/27 AM   Assessment: 89 y.o. female past medical history significant for prior breast cancer, hypertension, atrial fibrillation on Eliquis, who presents to the emergency department  from wound clinic. Concern of decreased blood flow to left foot - acute limb ischemia . Pharmacy consulted to manage heparin infusion. Heparin level no longer falsely elevated.     Goal of Therapy:  Heparin level 0.3-0.7 units/ml Monitor platelets by anticoagulation protocol: Yes  Results:  2/28 @ 0321, aPTT = 166, heparin infusion rate 900 un/hr ->600 un/hr 2/28 @ 1540, aPTT = 86, heparin infusion rate 600 un/hr (therapeutic x 1) 2/28 @ 2341 aPTT = 71, therapeutic X 2  2/29@ 0837 aPTT=96, therapeutic  3/01@ 0313    aPTT=98,  HL = 1.0 3/02@ 0457     aPTT=102, HL = 0.65 3/03@ 0621 HL = 0.22.    Plan:  Heparin level is subtherapeutic. Heparin was held for lab to draw labs, so doubt that would be the reason for the subtherapeutic level. Will give heparin bolus of 1000 units x 1 and increase the heparin infusion to 700 units/hr. Recheck heparin level in 8 hours CBC daily while on heparin.   Oswald Hillock, PharmD, BCPS Clinical Pharmacist 11/27/2022 7:28 AM

## 2022-11-27 NOTE — Consult Note (Signed)
ANTICOAGULATION CONSULT NOTE - Follow Up Consult  Pharmacy Consult for Heparin infusion (apixaban PTA) Indication:  limb ischemia  Allergies  Allergen Reactions   Aspirin Itching and Other (See Comments)    Dizzy/passing out   Augmentin [Amoxicillin-Pot Clavulanate]    Codeine Other (See Comments)    dizziness   Doxycycline Hyclate Other (See Comments)   Sulfamethoxazole-Trimethoprim Diarrhea   Tape Itching   Vitamin D Analogs    Azithromycin Rash and Hives    Patient Measurements: Height: '5\' 4"'$  (162.6 cm) Weight: 72.3 kg (159 lb 6.3 oz) IBW/kg (Calculated) : 54.7 Heparin Dosing Weight: 75.3 kg  Vital Signs: Temp: 98.1 F (36.7 C) (03/03 1505) BP: 97/64 (03/03 1505) Pulse Rate: 76 (03/03 1505)  Labs: Recent Labs    11/25/22 0313 11/26/22 0457 11/27/22 0621 11/27/22 1556  HGB 9.6* 8.8* 9.0*  --   HCT 29.9* 27.5* 27.6*  --   PLT 247 250 228  --   APTT 98* 102*  --   --   HEPARINUNFRC 1.00* 0.65 0.22* 0.26*  CREATININE 1.12* 1.05* 1.24*  --      Estimated Creatinine Clearance: 27.6 mL/min (A) (by C-G formula based on SCr of 1.24 mg/dL (H)).  Medications:  PTA: apixaban 2.5 mg BID - last dose 2/27 AM   Assessment: 89 y.o. female past medical history significant for prior breast cancer, hypertension, atrial fibrillation on Eliquis, who presents to the emergency department  from wound clinic. Concern of decreased blood flow to left foot - acute limb ischemia . Pharmacy consulted to manage heparin infusion. Heparin level no longer falsely elevated.     Goal of Therapy:  Heparin level 0.3-0.7 units/ml Monitor platelets by anticoagulation protocol: Yes  Results:  2/28 @ 0321, aPTT = 166, heparin infusion rate 900 un/hr ->600 un/hr 2/28 @ 1540, aPTT = 86, heparin infusion rate 600 un/hr (therapeutic x 1) 2/28 @ 2341 aPTT = 71, therapeutic X 2  2/29@ 0837 aPTT=96, therapeutic  3/01@ 0313    aPTT=98,  HL = 1.0 3/02@ 0457    aPTT=102, HL = 0.65 3/03@ 0621 HL =  0.22 3/03 '@1556'$  HL = 0.26   Plan:  Heparin level is subtherapeutic again. Will give heparin bolus of 1000 units x 1 and increase the heparin infusion to 850 units/hr. Recheck heparin level in 8 hours CBC daily while on heparin.   Wynelle Cleveland, PharmD, BCPS Clinical Pharmacist 11/27/2022 4:36 PM

## 2022-11-27 NOTE — Progress Notes (Signed)
[facility] Progress Note  Patient: Stacy [name]    [id number]  DOA: 11/22/2022     Date of Service: the patient was seen and examined on 11/27/2022  Chief Complaint  Patient presents with   Wound Check   Brief hospital course: BENICIA BIRMAN is a 89 y.o. female with medical history significant of hypertension, fibromyalgia, atrial fibrillation on anticoagulation with Eliquis,AAA history of breast cancer.  She presents to the emergency room with new left lower extremity ulcers on first and fifth digit.  Onset of symptoms few days prior.  She has previously been seen and evaluated in the past for similar lower extremity ulcers with associated cellulitis.  Her last admission was at [facility] in December 2023.  At that time, patient met SIRS criteria and required admission.  She was treated with vancomycin and cefepime.  She has since been following up with wound care clinic as outpatient.  She presented today with new extremity ulcers and associated pain.  No obvious drainage from wound site.   X-rays obtained showed no bony involvement.  Patient was initiated on IV antibiotics with cefepime and vancomycin.  No obvious leukocytosis or left shift.  ED consulted vascular surgeon Dr. Franchot Gallo who recommended initiating patient on heparin protocol pending evaluation in a.m. for possible angiogram.  This explained to patient and family.  They agree with this plan as detailed.   Assessment and Plan:  # Recurrent left lower extremity cellulitis due to severe PVD New findings of ulcers on toes and on the heel.  There is also a lesion on anterior shin.  Considering recurrence of these ulcers could be due to PVD vascular surgery consulted, plan is for angiogram when patient is stable.   Continue heparin IV infusion Due to bacteremia vascular surgery will do angiogram most likely on Monday   # Strep pyogenes bacteremia due to left lower extremity cellulitis Started cefazolin 2 g IV every 8 hourly and  started linezolid 600 mg IV every 12 hourly Blood culture growing strep pathogen, follow sensitivity report 2/29 Repeat blood cultures collected today a.m. TTE shows LVEF 30 to 35%, moderately decreased function, global hypokinesis with severe hypokinesis of anterior and anterolateral wall.  Severe tricuspid regurgitation, and moderate to severe aortic valve stenosis.  No mention of endocarditis ID consulted for recommendation of antibiotics and duration.   # Atrial fibrillation on anticoagulation with Eliquis.  Continued amiodarone 100 mg p.o. daily home dose Discontinued metoprolol due to low blood pressure Held Eliquis for now as patient is on heparin IV infusion   # Hypotension, patient's blood pressure dropped, patient was not septic. S/p IV fluid for hydration Skipped a dose of metoprolol on 2/28, discontinued metoprolol on 2/29 due to persistent low blood pressure Started midodrine 5 mg p.o. 3 times daily with holding parameters We will continue to monitor BP and titrate medications accordingly  #Isotonic hyponatremia, unknown cause Na 138--128 Serum osmolality 283 WNL Started sodium chloride tablets  Monitor sodium level daily   # Hypokalemia, potassium repleted. # Hypomagnesemia, mag repleted. # Hypoglycemia when she was n.p.o.  Hypoglycemia protocol was followed.  Monitor CBG. Monitor electrolytes and replete as needed.   CKD stage IIIa: Stable renal function.   Aneurysmal suprarenal abdominal aorta (3.2 cm) : chronic.    Polymyalgia rheumatic/inflammatory arthritis: Patient is on chronic Plaquenil. Hold Plaquenil for now due to bacteremia, resume on discharge.  CHF: Combined diastolic and systolic: LVEF of 45 to [id number].  Not in acute exacerbation at this time.  2/28 TTE LVEF 30 to 25%, complete report as above. BNP 562 elevated  Conjunctivitis: s/p Ofloxacin eyedrops, completed course.   Continue fresh tear as needed. 3/3 still right eye is red, started Neosporin  ointment twice daily for 7 days  Hypothyroidism: Continue on Synthroid.    Anemia of chronic disease Hb 11.3-8.9-9.1--9.6 Monitor H&H and transfuse if hemoglobin less than 7 Check fecal occult blood to rule out GI bleeding   Body mass index is 27.36 kg/m.  Interventions:      Diet: Regular diet DVT Prophylaxis: Therapeutic Anticoagulation with heparin IV infusion    Advance goals of care discussion: DNR  Family Communication: family was present at bedside, at the time of interview.  The pt provided permission to discuss medical plan with the family. Opportunity was given to ask question and all questions were answered satisfactorily.   Disposition:  Pt is from Home, admitted with left lower extremity cellulitis and PVD, vascular surgery recommended angiogram on Monday and ID rec IV antibiotics, which precludes a safe discharge. Discharge to Home vs SNF TBD, when stable, may need few days to stay. Palliative care consulted for goals of care discussion with family.   Subjective: No significant events overnight, patient still has pain in the left foot, right eye redness.  Denies any nausea vomiting or diarrhea today, no abdominal pain.  Denied chest pain or palpitations.  Overall she said that she is feeling good   Physical Exam: General: NAD, lying comfortably Appear in no distress, affect appropriate Eyes: PERRLA ENT: Oral Mucosa Clear, moist  Neck: no JVD,  Cardiovascular: Irregular rhythm,  Murmur audible due to mod to severe aortic valve stenosis,  Respiratory: good respiratory effort, Bilateral Air entry equal and Decreased, no Crackles, no wheezes Abdomen: Bowel Sound present, Soft and no tenderness,  Skin: no rashes Extremities: no Pedal edema, no calf tenderness, LLE lateral surface wound covered with dressing, blackish discolored wound on the great toe, bluish discoloration of great toe, and lateral surface of the foot near the fifth digit.  Left heel unstageable  ulcer, blister on the left great toe which is new on 3/2 Neurologic: without any new focal findings Gait not checked due to patient safety concerns  Vitals:   11/26/22 0856 11/26/22 1812 11/26/22 2100 11/27/22 0736  BP: 127/75 (!) 89/49 116/66 (!) 106/53  Pulse: 92 72 66 72  Resp: '16 18  17  '$ Temp: 98.2 F (36.8 C) 98.7 F (37.1 C) 98 F (36.7 C) 98 F (36.7 C)  TempSrc:  Oral Oral   SpO2: 97% 98% 94% 100%  Weight:      Height:        Intake/Output Summary (Last 24 hours) at 11/27/2022 1317 Last data filed at 11/27/2022 [id number] Gross per 24 hour  Intake 1844.62 ml  Output 650 ml  Net 1194.62 ml   Filed Weights   11/22/22 2000  Weight: 72.3 kg    Data Reviewed: I have personally reviewed and interpreted daily labs, tele strips, imagings as discussed above. I reviewed all nursing notes, pharmacy notes, vitals, pertinent old records I have discussed plan of care as described above with RN and patient/family.  CBC: Recent Labs  Lab 11/23/22 0321 11/23/22 1519 11/24/22 0807 11/25/22 0313 11/26/22 0457 11/27/22 0621  WBC 6.7  --  5.8 6.9 7.0 7.3  HGB 8.9* 9.1* 9.1* 9.6* 8.8* 9.0*  HCT 27.2* 28.7* 28.9* 29.9* 27.5* 27.6*  MCV 92.5  --  94.8 94.0 94.5 92.3  PLT 238  --  237 247 250 [id number]   Basic Metabolic Panel: Recent Labs  Lab 11/23/22 0321 11/24/22 0807 11/25/22 0313 11/26/22 0457 11/27/22 0621  NA 138 137 133* 130* 128*  K 3.8 3.2* 3.7 3.6 3.6  CL 109 107 105 105 99  CO2 20* 22 20* 17* 21*  GLUCOSE 67* 71 92 93 159*  BUN 34* 30* 28* 27* 23  CREATININE 1.09* 1.17* 1.12* 1.05* 1.24*  CALCIUM 7.6* 7.7* 8.0* 8.0* 8.0*  MG  --  1.0* 1.7 1.7 1.5*  PHOS  --  3.3 3.1 2.5 2.6    Studies: No results found.  Scheduled Meds:  acetaminophen  500 mg Oral TID   amiodarone  100 mg Oral Daily   vitamin C  500 mg Oral BID   bacitracin-polymyxin b   Both Eyes BID   bisacodyl  10 mg Oral QHS   calcium carbonate  600 mg of elemental calcium Oral BID WC   feeding  supplement  237 mL Oral TID BM   ferrous sulfate  325 mg Oral QODAY   gabapentin  200 mg Oral TID   levothyroxine  50 mcg Oral QAC breakfast   midodrine  5 mg Oral TID WC   multivitamin with minerals  1 tablet Oral Daily   pantoprazole  40 mg Oral BID   polyethylene glycol  17 g Oral Daily   sodium bicarbonate  650 mg Oral TID   sodium chloride  2 g Oral TID WC   zinc sulfate  220 mg Oral Daily   Continuous Infusions:  sodium chloride 50 mL/hr at 11/27/22 1115    ceFAZolin (ANCEF) IV     heparin 700 Units/hr (11/27/22 0740)   linezolid (ZYVOX) IV 600 mg (11/27/22 1117)   magnesium sulfate bolus IVPB 4 g (11/27/22 1248)   PRN Meds: acetaminophen **OR** acetaminophen, albuterol, HYDROcodone-acetaminophen, ondansetron (ZOFRAN) IV, ondansetron, polyvinyl alcohol  Time spent: 35 minutes  Author: Val Riles. MD [facility] 11/27/2022 1:17 PM  To reach On-call, see care teams to locate the attending and reach out to them via [email or web address]. If 7PM-7AM, please contact night-coverage If you still have difficulty reaching the attending provider, please page the [facility] (Director on Call) for [facility] on amion for assistance.

## 2022-11-28 ENCOUNTER — Encounter: Payer: Self-pay | Admitting: Vascular Surgery

## 2022-11-28 ENCOUNTER — Encounter
Admission: EM | Disposition: A | Discharge status: Skilled Nursing Facility | Payer: Self-pay | Source: Home / Self Care | Attending: Student

## 2022-11-28 DIAGNOSIS — L089 Local infection of the skin and subcutaneous tissue, unspecified: Secondary | ICD-10-CM

## 2022-11-28 DIAGNOSIS — T148XXA Other injury of unspecified body region, initial encounter: Secondary | ICD-10-CM

## 2022-11-28 DIAGNOSIS — I70245 Atherosclerosis of native arteries of left leg with ulceration of other part of foot: Secondary | ICD-10-CM

## 2022-11-28 DIAGNOSIS — L97529 Non-pressure chronic ulcer of other part of left foot with unspecified severity: Secondary | ICD-10-CM

## 2022-11-28 DIAGNOSIS — I739 Peripheral vascular disease, unspecified: Secondary | ICD-10-CM | POA: Diagnosis not present

## 2022-11-28 DIAGNOSIS — R7881 Bacteremia: Secondary | ICD-10-CM | POA: Diagnosis not present

## 2022-11-28 DIAGNOSIS — Z66 Do not resuscitate: Secondary | ICD-10-CM

## 2022-11-28 DIAGNOSIS — S91332A Puncture wound without foreign body, left foot, initial encounter: Secondary | ICD-10-CM | POA: Diagnosis not present

## 2022-11-28 DIAGNOSIS — I70201 Unspecified atherosclerosis of native arteries of extremities, right leg: Secondary | ICD-10-CM

## 2022-11-28 DIAGNOSIS — B955 Unspecified streptococcus as the cause of diseases classified elsewhere: Secondary | ICD-10-CM

## 2022-11-28 DIAGNOSIS — Z515 Encounter for palliative care: Secondary | ICD-10-CM

## 2022-11-28 DIAGNOSIS — L03116 Cellulitis of left lower limb: Secondary | ICD-10-CM | POA: Diagnosis not present

## 2022-11-28 HISTORY — PX: LOWER EXTREMITY ANGIOGRAPHY: CATH118251

## 2022-11-28 LAB — BASIC METABOLIC PANEL
Anion gap: 6 (ref 5–15)
BUN: 22 mg/dL (ref 8–23)
CO2: 19 mmol/L — ABNORMAL LOW (ref 22–32)
Calcium: 7.5 mg/dL — ABNORMAL LOW (ref 8.9–10.3)
Chloride: 108 mmol/L (ref 98–111)
Creatinine, Ser: 1.02 mg/dL — ABNORMAL HIGH (ref 0.44–1.00)
GFR, Estimated: 51 mL/min — ABNORMAL LOW (ref >60)
Glucose, Bld: 67 mg/dL — ABNORMAL LOW (ref 70–99)
Potassium: 3.6 mmol/L (ref 3.5–5.1)
Sodium: 133 mmol/L — ABNORMAL LOW (ref 135–145)

## 2022-11-28 LAB — GLUCOSE, CAPILLARY
Glucose-Capillary: 121 mg/dL — ABNORMAL HIGH (ref 70–99)
Glucose-Capillary: 65 mg/dL — ABNORMAL LOW (ref 70–99)
Glucose-Capillary: 67 mg/dL — ABNORMAL LOW (ref 70–99)
Glucose-Capillary: 67 mg/dL — ABNORMAL LOW (ref 70–99)
Glucose-Capillary: 70 mg/dL (ref 70–99)
Glucose-Capillary: 74 mg/dL (ref 70–99)
Glucose-Capillary: 75 mg/dL (ref 70–99)
Glucose-Capillary: 78 mg/dL (ref 70–99)
Glucose-Capillary: 83 mg/dL (ref 70–99)
Glucose-Capillary: 84 mg/dL (ref 70–99)

## 2022-11-28 LAB — MAGNESIUM: Magnesium: 2 mg/dL (ref 1.7–2.4)

## 2022-11-28 LAB — CBC
HCT: 27.6 % — ABNORMAL LOW (ref 36.0–46.0)
Hemoglobin: 9 g/dL — ABNORMAL LOW (ref 12.0–15.0)
MCH: 30.1 pg (ref 26.0–34.0)
MCHC: 32.6 g/dL (ref 30.0–36.0)
MCV: 92.3 fL (ref 80.0–100.0)
Platelets: 218 10*3/uL (ref 150–400)
RBC: 2.99 MIL/uL — ABNORMAL LOW (ref 3.87–5.11)
RDW: 22.4 % — ABNORMAL HIGH (ref 11.5–15.5)
WBC: 7 10*3/uL (ref 4.0–10.5)
nRBC: 0 % (ref 0.0–0.2)

## 2022-11-28 LAB — PHOSPHORUS: Phosphorus: 2.6 mg/dL (ref 2.5–4.6)

## 2022-11-28 LAB — HEPARIN LEVEL (UNFRACTIONATED)
Heparin Unfractionated: 0.34 IU/mL (ref 0.30–0.70)
Heparin Unfractionated: 0.28 IU/mL — ABNORMAL LOW (ref 0.30–0.70)

## 2022-11-28 SURGERY — LOWER EXTREMITY ANGIOGRAPHY
Anesthesia: Moderate Sedation | Laterality: Left

## 2022-11-28 MED ORDER — APIXABAN 2.5 MG PO TABS
2.5000 mg | ORAL_TABLET | Freq: Two times a day (BID) | ORAL | Status: DC
  Administered 2022-11-29 – 2022-12-02 (×7): 2.5 mg via ORAL
  Filled 2022-11-28 (×8): qty 1

## 2022-11-28 MED ORDER — IODIXANOL 320 MG/ML IV SOLN
INTRAVENOUS | Status: DC | PRN
  Administered 2022-11-28: 65 mL

## 2022-11-28 MED ORDER — FAMOTIDINE 20 MG PO TABS: 40.0000 mg | ORAL_TABLET | Freq: Once | ORAL | Status: DC | PRN

## 2022-11-28 MED ORDER — HEPARIN BOLUS VIA INFUSION
1000.0000 [IU] | Freq: Once | INTRAVENOUS | Status: AC
  Administered 2022-11-28: 1000 [IU] via INTRAVENOUS
  Filled 2022-11-28: qty 1000

## 2022-11-28 MED ORDER — DIPHENHYDRAMINE HCL 50 MG/ML IJ SOLN: 50.0000 mg | Freq: Once | INTRAMUSCULAR | Status: DC | PRN

## 2022-11-28 MED ORDER — VANCOMYCIN HCL IN DEXTROSE 1-5 GM/200ML-% IV SOLN: 1000.0000 mg | INTRAVENOUS | Status: DC

## 2022-11-28 MED ORDER — DEXTROSE 50 % IV SOLN
12.5000 g | INTRAVENOUS | Status: AC
  Administered 2022-11-28: 12.5 g via INTRAVENOUS
  Filled 2022-11-28: qty 50

## 2022-11-28 MED ORDER — FENTANYL CITRATE PF 50 MCG/ML IJ SOSY: 12.5000 ug | PREFILLED_SYRINGE | Freq: Once | INTRAMUSCULAR | Status: DC | PRN

## 2022-11-28 MED ORDER — CEFAZOLIN SODIUM-DEXTROSE 2-4 GM/100ML-% IV SOLN
2.0000 g | Freq: Three times a day (TID) | INTRAVENOUS | Status: DC
  Administered 2022-11-28 – 2022-11-29 (×2): 2 g via INTRAVENOUS
  Filled 2022-11-28 (×2): qty 100

## 2022-11-28 MED ORDER — METHYLPREDNISOLONE SODIUM SUCC 125 MG IJ SOLR: 125.0000 mg | Freq: Once | INTRAMUSCULAR | Status: DC | PRN

## 2022-11-28 MED ORDER — HEPARIN SODIUM (PORCINE) 1000 UNIT/ML IJ SOLN
INTRAMUSCULAR | Status: AC
  Filled 2022-11-28: qty 10

## 2022-11-28 MED ORDER — SODIUM CHLORIDE 0.9 % IV SOLN: INTRAVENOUS | Status: DC

## 2022-11-28 MED ORDER — FENTANYL CITRATE (PF) 100 MCG/2ML IJ SOLN
INTRAMUSCULAR | Status: AC
  Filled 2022-11-28: qty 2

## 2022-11-28 MED ORDER — MIDAZOLAM HCL 2 MG/2ML IJ SOLN
INTRAMUSCULAR | Status: DC | PRN
  Administered 2022-11-28: 1 mg via INTRAVENOUS

## 2022-11-28 MED ORDER — FENTANYL CITRATE (PF) 100 MCG/2ML IJ SOLN
INTRAMUSCULAR | Status: DC | PRN
  Administered 2022-11-28 (×2): 25 ug via INTRAVENOUS

## 2022-11-28 MED ORDER — HYDROMORPHONE HCL 1 MG/ML IJ SOLN: 1.0000 mg | Freq: Once | INTRAMUSCULAR | Status: DC | PRN

## 2022-11-28 MED ORDER — HEPARIN SODIUM (PORCINE) 1000 UNIT/ML IJ SOLN
INTRAMUSCULAR | Status: DC | PRN
  Administered 2022-11-28: 4000 [IU] via INTRAVENOUS

## 2022-11-28 MED ORDER — CHLORHEXIDINE GLUCONATE CLOTH 2 % EX PADS: 6.0000 | MEDICATED_PAD | Freq: Once | CUTANEOUS | Status: DC

## 2022-11-28 MED ORDER — CLOPIDOGREL BISULFATE 75 MG PO TABS
75.0000 mg | ORAL_TABLET | Freq: Every day | ORAL | Status: DC
  Administered 2022-11-28 – 2022-12-02 (×5): 75 mg via ORAL
  Filled 2022-11-28 (×5): qty 1

## 2022-11-28 MED ORDER — MIDAZOLAM HCL 2 MG/ML PO SYRP: 8.0000 mg | ORAL_SOLUTION | Freq: Once | ORAL | Status: DC | PRN

## 2022-11-28 MED ORDER — POVIDONE-IODINE 10 % EX SOLN
Freq: Every day | CUTANEOUS | Status: DC
  Filled 2022-11-28: qty 473

## 2022-11-28 MED ORDER — MIDAZOLAM HCL 5 MG/5ML IJ SOLN
INTRAMUSCULAR | Status: AC
  Filled 2022-11-28: qty 5

## 2022-11-28 MED ORDER — ONDANSETRON HCL 4 MG/2ML IJ SOLN: 4.0000 mg | Freq: Four times a day (QID) | INTRAMUSCULAR | Status: DC | PRN

## 2022-11-28 SURGICAL SUPPLY — 19 items
BALLN ARMADA 2.5X100X150 (BALLOONS) ×1
BALLN LUTONIX 018 4X150X130 (BALLOONS) ×1
BALLOON ARMADA 2.5X100X150 (BALLOONS) IMPLANT
BALLOON LUTONIX 018 4X150X130 (BALLOONS) IMPLANT
CATH ANGIO 5F PIGTAIL 65CM (CATHETERS) IMPLANT
CATH VERT 5X100 (CATHETERS) IMPLANT
COVER PROBE ULTRASOUND 5X96 (MISCELLANEOUS) IMPLANT
DEVICE STARCLOSE SE CLOSURE (Vascular Products) IMPLANT
GLIDESHEATH SLENDER 7FR .021G (SHEATH) IMPLANT
GLIDEWIRE ADV .035X260CM (WIRE) IMPLANT
KIT ENCORE 26 ADVANTAGE (KITS) IMPLANT
PACK ANGIOGRAPHY (CUSTOM PROCEDURE TRAY) ×1 IMPLANT
SHEATH BRITE TIP 5FRX11 (SHEATH) IMPLANT
SHEATH RAABE 6FRX70 (SHEATH) IMPLANT
STENT LIFESTREAM 9X38X80 (Permanent Stent) IMPLANT
SYR MEDRAD MARK 7 150ML (SYRINGE) IMPLANT
TUBING CONTRAST HIGH PRESS 72 (TUBING) IMPLANT
WIRE G V18X300CM (WIRE) IMPLANT
WIRE GUIDERIGHT .035X150 (WIRE) IMPLANT

## 2022-11-28 NOTE — Op Note (Signed)
[facility]  Percutaneous Study/Intervention Procedural Note   Date of Surgery: 11/28/2022  Surgeon(s):Awesome Jared    Assistants:none  Pre-operative Diagnosis: PAD with ulceration left lower extremity  Post-operative diagnosis:  Same  Procedure(s) Performed:             1.  Ultrasound guidance for vascular access right femoral artery             2.  Catheter placement into left common femoral artery from right femoral approach             3.  Aortogram and selective left lower extremity angiogram             4.  Percutaneous transluminal angioplasty of left peroneal artery with 2.5 mm diameter by 10 cm length angioplasty balloon             5.  Percutaneous transluminal angioplasty of left popliteal artery and tibioperoneal trunk with 4 mm diameter by 15 cm length Lutonix drug-coated angioplasty balloon  6.  Lifestream stent placement to the right common iliac artery with 9 mm diameter by 38 mm length Lifestream stent             7.  StarClose closure device right femoral artery  EBL: 5 cc  Contrast: 65 cc  Fluoro Time: 3.4 minutes  Moderate Conscious Sedation Time: approximately 33 minutes using 1 mg of Versed and 50 mcg of Fentanyl              Indications:  Patient is a 89 y.o.female with nonhealing ulcerations of the left foot with nonpalpable pedal pulses. The patient is brought in for angiography for further evaluation and potential treatment.  Due to the limb threatening nature of the situation, angiogram was performed for attempted limb salvage. The patient is aware that if the procedure fails, amputation would be expected.  The patient also understands that even with successful revascularization, amputation may still be required due to the severity of the situation.  Risks and benefits are discussed and informed consent is obtained.   Procedure:  The patient was identified and appropriate procedural time out was performed.  The patient was then placed  supine on the table and prepped and draped in the usual sterile fashion. Moderate conscious sedation was administered during a face to face encounter with the patient throughout the procedure with my supervision of the RN administering medicines and monitoring the patient's vital signs, pulse oximetry, telemetry and mental status throughout from the start of the procedure until the patient was taken to the recovery room. Ultrasound was used to evaluate the right common femoral artery.  It was patent .  A digital ultrasound image was acquired.  A Seldinger needle was used to access the right common femoral artery under direct ultrasound guidance and a permanent image was performed.  A 0.035 J wire was advanced without resistance and a 5Fr sheath was placed.  Pigtail catheter was placed into the aorta and an AP aortogram was performed. This demonstrated that the right renal artery appeared to be fairly heavily diseased with greater than 75% stenosis.  The left renal artery appeared to be widely patent.  The aorta itself was patent and mildly ectatic.  The left common and external iliac arteries were patent without significant stenosis.  The right common iliac artery had about a 75% stenosis.  The right external iliac artery was fairly normal. I then crossed the aortic bifurcation and advanced to the left femoral head. Selective  left lower extremity angiogram was then performed. This demonstrated calcific but not stenotic left common femoral artery, profunda femoris artery, and superficial femoral artery.  The popliteal artery had mild disease proximally but in the distal popliteal artery and tibioperoneal trunk there was diffuse disease in the 60 to 70% range.  The anterior tibial and posterior tibial arteries were chronically occluded with no distal reconstitution seen in the posterior tibial artery and poor distal reconstitution seen in the anterior tibial artery.  The peroneal artery was the only continuous runoff  and it had moderate stenosis in the 60 to 70% range in the proximal and proximal to mid segment. It was felt that it was in the patient's best interest to proceed with intervention after these images to avoid a second procedure and a larger amount of contrast and fluoroscopy based off of the findings from the initial angiogram. The patient was systemically heparinized and a 6 [place] Ansell sheath was then placed over the [employer] wire. I then used a Kumpe catheter and the advantage wire to get down to the tibioperoneal trunk and then exchanged with a Kumpe catheter for a V18 wire which I then used to cross the peroneal artery stenosis and parked the wire at the ankle.  I then proceeded with treatment.  A 2.5 mm diameter by 10 cm length angioplasty balloon were used to treat the peroneal artery inflating this up to 14 atm for 1 minute.  A 4 mm diameter by 15 cm length Lutonix drug-coated angioplasty balloon was then used for the below-knee popliteal artery and tibioperoneal trunk and inflated to 8 atm for 1 minute.  Completion imaging showed marked improvement with less than 10% residual stenosis in the peroneal artery and less than 20% residual stenosis in the popliteal artery and tibioperoneal trunk.  Her left lower extremity perfusion was markedly improved.  I then upsized to a 7 French sheath to address the right common iliac artery lesion.  After exchanging back for the 0.035 wire a 9 mm diameter by 38 mm length Lifestream stent was selected for the right common iliac artery.  This was deployed just below the aortic bifurcation and just above the internal iliac artery origin encompassing the lesion.  This was inflated to 12 atm.  Completion imaging showed no significant residual stenosis after stent placement.  I elected to terminate the procedure. The sheath was removed and StarClose closure device was deployed in the right femoral artery with excellent hemostatic result. The patient was taken to the  recovery room in stable condition having tolerated the procedure well.  Findings:               Aortogram:  This demonstrated that the right renal artery appeared to be fairly heavily diseased with greater than 75% stenosis.  The left renal artery appeared to be widely patent.  The aorta itself was patent and mildly ectatic.  The left common and external iliac arteries were patent without significant stenosis.  The right common iliac artery had about a 75% stenosis.  The right external iliac artery was fairly normal             Left lower Extremity:  This demonstrated calcific but not stenotic left common femoral artery, profunda femoris artery, and superficial femoral artery.  The popliteal artery had mild disease proximally but in the distal popliteal artery and tibioperoneal trunk there was diffuse disease in the 60 to 70% range.  The anterior tibial and posterior tibial arteries were chronically  occluded with no distal reconstitution seen in the posterior tibial artery and poor distal reconstitution seen in the anterior tibial artery.  The peroneal artery was the only continuous runoff and it had moderate stenosis in the 60 to 70% range in the proximal and proximal to mid segment.   Disposition: Patient was taken to the recovery room in stable condition having tolerated the procedure well.  Complications: None  Leotis Pain 11/28/2022 12:43 PM   This note was created with Dragon Medical transcription system. Any errors in dictation are purely unintentional.

## 2022-11-28 NOTE — Consult Note (Signed)
ANTICOAGULATION CONSULT NOTE - Follow Up Consult  Pharmacy Consult for Heparin infusion (apixaban PTA) Indication:  limb ischemia  Allergies  Allergen Reactions   Aspirin Itching and Other (See Comments)    Dizzy/passing out   Augmentin [Amoxicillin-Pot Clavulanate]    Codeine Other (See Comments)    dizziness   Doxycycline Hyclate Other (See Comments)   Sulfamethoxazole-Trimethoprim Diarrhea   Tape Itching   Vitamin D Analogs    Azithromycin Rash and Hives    Patient Measurements: Height: '5\' 4"'$  (162.6 cm) Weight: 72.3 kg (159 lb 6.3 oz) IBW/kg (Calculated) : 54.7 Heparin Dosing Weight: 75.3 kg  Vital Signs: Temp: 97.7 F (36.5 C) (03/04 0751) BP: 102/55 (03/04 0751) Pulse Rate: 72 (03/04 0751)  Labs: Recent Labs    11/26/22 0457 11/27/22 0621 11/27/22 1556 11/28/22 0117 11/28/22 0505 11/28/22 0900  HGB 8.8* 9.0*  --   --  9.0*  --   HCT 27.5* 27.6*  --   --  27.6*  --   PLT 250 228  --   --  218  --   APTT 102*  --   --   --   --   --   HEPARINUNFRC 0.65 0.22* 0.26* 0.34  --  0.28*  CREATININE 1.05* 1.24*  --   --  1.02*  --      Estimated Creatinine Clearance: 33.6 mL/min (A) (by C-G formula based on SCr of 1.02 mg/dL (H)).  Medications:  PTA: apixaban 2.5 mg BID - last dose 2/27 AM   Assessment: 89 y.o. female past medical history significant for prior breast cancer, hypertension, atrial fibrillation on Eliquis, who presents to the emergency department  from wound clinic. Concern of decreased blood flow to left foot - acute limb ischemia . Pharmacy consulted to manage heparin infusion. Heparin level no longer falsely elevated.     Goal of Therapy:  Heparin level 0.3-0.7 units/ml Monitor platelets by anticoagulation protocol: Yes  Results:  2/28 @ 0321, aPTT = 166, heparin infusion rate 900 un/hr ->600 un/hr 2/28 @ 1540, aPTT = 86, heparin infusion rate 600 un/hr (therapeutic x 1) 2/28 @ 2341 aPTT = 71, therapeutic X 2  2/29@ 0837 aPTT=96,  therapeutic  3/01@ 0313    aPTT=98,  HL = 1.0 3/02@ 0457    aPTT=102, HL = 0.65 3/03@ 0621 HL = 0.22 3/03 '@1556'$  HL = 0.26 3/04 '@0117'$     HL = 0.34, therapeutic X 1 3/14 '@0900'$  HL = 0.28     Plan:   -Heparin level is subtherapeutic. Will give heparin bolus of 1000 units x 1 and increase the heparin infusion to 1000 units/hr. Recheck heparin level in 8 hours - CBC daily while on heparin.    Alison Murray, PharmD Clinical Pharmacist 11/28/2022 10:00 AM

## 2022-11-28 NOTE — Consult Note (Addendum)
Consultation Note Date: 11/28/2022 at 1015  Patient Name: Stacy [name]  DOB: October 25, 1926  MRN: [id number]  Age / Sex: 89 y.o., female  PCP: Dewayne Shorter, MD Referring Physician: Val Riles, MD  Reason for Consultation: Establishing goals of care  HPI/Patient Profile: 89 y.o. female  with past medical history of HTN, fibromyalgia, A-fib (Eliquis), AAA, history of breast cancer, and recent hospitalization at [facility] (December 2023 admitted on 11/22/2022 for lower extremity ulcers with associated cellulitis new left lower extremity ulcers on first and fifth digit.   Patient is being treated for group A strep bacteremia with cefazolin and linezolid.  3/4 ptient underwent angiogram -angioplasty of left peroneal artery, angioplasty of left popliteal artery and tibioperoneal trunk, and placement of Lifestream stent to right common iliac artery.  PMT was consulted to discuss goals of care.  Clinical Assessment and Goals of Care: I have reviewed medical records including EPIC notes, labs and imaging, assessed the patient and then met with patient, her sons Stacy [name] and Stacy [name] and daughter Stacy [name] at bedside to discuss diagnosis prognosis, GOC, EOL wishes, disposition and options.  I introduced Palliative Medicine as specialized medical care for people living with serious illness. It focuses on providing relief from the symptoms and stress of a serious illness. The goal is to improve quality of life for both the patient and the family.  Patient was taken to angiogram during her visit.  I made plan with family to return bedside once patient had completed angiogram.  1415 - I returned to bedside after RN notified me that patient had returned from angiogram. Patient's son was at bedside. All other family has left.  We discussed a brief life review of the patient.  Patient has been separated from her husband for over 44 years.   Son endorses she works several jobs to pay the bills.    Patient is participatory in our discussions.  She is alert to self and place but not clear on date and reason for hospitalization.  She is aware that she is just returned from some procedure to help with her legs..  However, she is able to make her wishes known.  She request her dentures prior to eating and is able to feed herself during our discussion.  We discussed patient's current illness and what it means in the larger context of patient's on-going co-morbidities.  PVD and advanced age discussed his contributing factors to patient's overall functional status and prognosis.  I attempted to elicit values and goals of care important to the patient.  Patient states she wants to go home.  Patient's son shares that he the plan is for her to return to LTC at [facility] when medically stable.  Advance directives, concepts specific to code status, artificial feeding and hydration, and rehospitalization were considered and discussed. Pt has HCPOA in place naming 3/4 children as joint decision makers in the even she is unable to speak for herself.  MOST form introduced and copy given for patient and family's review.  Discussed importance  of advance care planning documentation.  Patient in agreement with DNR and to never have a feeding tube.  Patient was unable to provide responses clearly for her use of antibiotics, level of care if she was to return to the hospital, or use of artificial hydration.  Ongoing discussions and support to continue.  Discussed with patient/family the importance of continued conversation with family and the medical providers regarding overall plan of care and treatment options, ensuring decisions are within the context of the patient's values and GOCs.    The difference between palliative and hospice services discussed. Palliative Care services outpatient were explained and offered. Patient and son accepting. TOC referral  placed.   Questions and concerns were addressed. The family was encouraged to call with questions or concerns.   PMT will continue to follow. Copies of Hard Choices book X2 left for family to review.   Primary Decision Maker HCPOA  Physical Exam Vitals reviewed.  Constitutional:      General: She is not in acute distress.    Appearance: She is normal weight. She is not ill-appearing.  HENT:     Head: Normocephalic.     Nose: Nose normal.     Mouth/Throat:     Mouth: Mucous membranes are moist.  Eyes:     Pupils: Pupils are equal, round, and reactive to light.  Cardiovascular:     Rate and Rhythm: Normal rate. Rhythm irregular.     Pulses: Normal pulses.  Pulmonary:     Effort: Pulmonary effort is normal.  Abdominal:     Palpations: Abdomen is soft.  Musculoskeletal:     Comments: Generalized weakness, able to [facility]  Skin:    General: Skin is warm and dry.     Comments: Ulcers of LEs  Neurological:     Mental Status: She is alert.     Comments: Oriented to self and place  Psychiatric:        Mood and Affect: Mood normal.        Behavior: Behavior normal.        Thought Content: Thought content normal.        Judgment: Judgment normal.     Palliative Assessment/Data: 40%     Thank you for this consult. Palliative medicine will continue to follow and assist holistically.   Time Total: 75 minutes Greater than 50%  of this time was spent counseling and coordinating care related to the above assessment and plan.  Signed by: Jordan Hawks, DNP, FNP-BC Palliative Medicine    Please contact Palliative Medicine Team phone at [phone] for questions and concerns.  For individual provider: See Shea Evans

## 2022-11-28 NOTE — Consult Note (Signed)
ANTICOAGULATION CONSULT NOTE - Follow Up Consult  Pharmacy Consult for Heparin infusion (apixaban PTA) Indication:  limb ischemia  Allergies  Allergen Reactions   Aspirin Itching and Other (See Comments)    Dizzy/passing out   Augmentin [Amoxicillin-Pot Clavulanate]    Codeine Other (See Comments)    dizziness   Doxycycline Hyclate Other (See Comments)   Sulfamethoxazole-Trimethoprim Diarrhea   Tape Itching   Vitamin D Analogs    Azithromycin Rash and Hives    Patient Measurements: Height: '5\' 4"'$  (162.6 cm) Weight: 72.3 kg (159 lb 6.3 oz) IBW/kg (Calculated) : 54.7 Heparin Dosing Weight: 75.3 kg  Vital Signs: Temp: 97.8 F (36.6 C) (03/03 2349) BP: 96/64 (03/03 2349) Pulse Rate: 64 (03/03 2349)  Labs: Recent Labs    11/25/22 0313 11/26/22 0457 11/27/22 0621 11/27/22 1556 11/28/22 0117  HGB 9.6* 8.8* 9.0*  --   --   HCT 29.9* 27.5* 27.6*  --   --   PLT 247 250 228  --   --   APTT 98* 102*  --   --   --   HEPARINUNFRC 1.00* 0.65 0.22* 0.26* 0.34  CREATININE 1.12* 1.05* 1.24*  --   --      Estimated Creatinine Clearance: 27.6 mL/min (A) (by C-G formula based on SCr of 1.24 mg/dL (H)).  Medications:  PTA: apixaban 2.5 mg BID - last dose 2/27 AM   Assessment: 89 y.o. female past medical history significant for prior breast cancer, hypertension, atrial fibrillation on Eliquis, who presents to the emergency department  from wound clinic. Concern of decreased blood flow to left foot - acute limb ischemia . Pharmacy consulted to manage heparin infusion. Heparin level no longer falsely elevated.     Goal of Therapy:  Heparin level 0.3-0.7 units/ml Monitor platelets by anticoagulation protocol: Yes  Results:  2/28 @ 0321, aPTT = 166, heparin infusion rate 900 un/hr ->600 un/hr 2/28 @ 1540, aPTT = 86, heparin infusion rate 600 un/hr (therapeutic x 1) 2/28 @ 2341 aPTT = 71, therapeutic X 2  2/29@ 0837 aPTT=96, therapeutic  3/01@ 0313    aPTT=98,  HL = 1.0 3/02@  0457    aPTT=102, HL = 0.65 3/03@ 0621 HL = 0.22 3/03 '@1556'$  HL = 0.26 3/04 '@0117'$     HL = 0.34, therapeutic X 1    Plan:  3/4:  HL @ 0117 = 0.34, therapeutic X 1  - Will continue pt on current rate and recheck HL in 8 hrs.  - CBC daily   Fanta Wimberley D, PharmD Clinical Pharmacist 11/28/2022 1:59 AM

## 2022-11-28 NOTE — Interval H&P Note (Signed)
History and Physical Interval Note:  11/28/2022 10:59 AM  Stacy [name]  has presented today for surgery, with the diagnosis of PAD.  The various methods of treatment have been discussed with the patient and family. After consideration of risks, benefits and other options for treatment, the patient has consented to  Procedure(s): Lower Extremity Angiography (Left) as a surgical intervention.  The patient's history has been reviewed, patient examined, no change in status, stable for surgery.  I have reviewed the patient's chart and labs.  Questions were answered to the patient's satisfaction.     Leotis Pain

## 2022-11-28 NOTE — TOC Progression Note (Signed)
Transition of Care [facility]) - Progression Note    Patient Details  Name: Stacy [name] MRN: [id number] Date of Birth: Nov 29, 1926  Transition of Care [facility]) CM/SW [place], RN Phone Number: 11/28/2022, 10:24 AM  Clinical Narrative:    Plan to do Angio possibly today, TOC to continue to follow and assist with needs   Expected Discharge Plan: [place] Barriers to Discharge: Continued Medical Work up  Expected Discharge Plan and [place] arrangements for the past 2 months: Single Family Home                                       Social Determinants of Health (SDOH) Interventions SDOH Screenings   Food Insecurity: No Food Insecurity (11/22/2022)  Housing: Low Risk  (11/22/2022)  Transportation Needs: No Transportation Needs (11/22/2022)  Utilities: Not At Risk (11/22/2022)  Alcohol Screen: Low Risk  (02/04/2019)  Depression (PHQ2-9): Low Risk  (08/30/2022)  Financial Resource Strain: Low Risk  (04/07/2021)  Tobacco Use: Low Risk  (11/22/2022)    Readmission Risk Interventions    11/28/2022   10:20 AM  Readmission Risk Prevention Plan  Transportation Screening Complete  PCP or Specialist Appt within 3-5 Days Complete  Social Work Consult for [place] Planning/Counseling [place] Not Applicable  Medication Review Press photographer) Referral to Pharmacy

## 2022-11-28 NOTE — Progress Notes (Signed)
Date of Admission:  11/22/2022     ID: Stacy [name] is a 89 y.o. female  Principal Problem:   Cellulitis of left lower extremity Active Problems:   Bacteremia due to Streptococcus   Wound infection  Son at bed side  Subjective: Had angio today Doing okay   Medications:   [MAR Hold] acetaminophen  500 mg Oral TID   [MAR Hold] amiodarone  100 mg Oral Daily   [MAR Hold] vitamin C  500 mg Oral BID   [MAR Hold] bacitracin-polymyxin b   Both Eyes BID   [MAR Hold] bisacodyl  10 mg Oral QHS   [MAR Hold] calcium carbonate  600 mg of elemental calcium Oral BID WC   Chlorhexidine Gluconate Cloth  6 each Topical Once   [MAR Hold] feeding supplement  237 mL Oral TID BM   fentaNYL       [MAR Hold] ferrous sulfate  325 mg Oral QODAY   [MAR Hold] gabapentin  200 mg Oral TID   heparin sodium (porcine)       [MAR Hold] levothyroxine  50 mcg Oral QAC breakfast   midazolam       [MAR Hold] midodrine  5 mg Oral TID WC   [MAR Hold] multivitamin with minerals  1 tablet Oral Daily   [MAR Hold] pantoprazole  40 mg Oral BID   [MAR Hold] polyethylene glycol  17 g Oral Daily   [MAR Hold] sodium chloride  2 g Oral TID WC   [MAR Hold] zinc sulfate  220 mg Oral Daily    Objective: Patient Vitals for the past 24 hrs:  BP Temp Temp src Pulse Resp SpO2  11/28/22 1128 (!) 99/42 (!) 97.4 F (36.3 C) Oral 78 12 100 %  11/28/22 0751 (!) 102/55 97.7 F (36.5 C) -- 72 17 100 %  11/27/22 2349 96/64 97.8 F (36.6 C) -- 64 16 100 %  11/27/22 1505 97/64 98.1 F (36.7 C) -- 76 17 92 %      PHYSICAL EXAM:  General: Alert, cooperative, no distress, pale Head: Normocephalic, without obvious abnormality, atraumatic. Eyes:rt ectropion Lungs: Clear to auscultation bilaterally. No Wheezing or Rhonchi. No rales. Heart: irregular Abdomen: Soft, non-tender,not distended. Bowel sounds normal. No masses Extremities:left great toe ulcer with eschar- surrounding erythema has reoslved. Bluish discoloration  tip 3/4  5th toe ulcer covered with eschar 3/4   Left heel ulcer-superficial  11/23/22         11/23/22   Neurologic: Grossly non-focal  Lab Results    Latest Ref Rng & Units 11/28/2022    5:05 AM 11/27/2022    6:21 AM 11/26/2022    4:57 AM  CBC  WBC 4.0 - 10.5 K/uL 7.0  7.3  7.0   Hemoglobin 12.0 - 15.0 g/dL 9.0  9.0  8.8   Hematocrit 36.0 - 46.0 % 27.6  27.6  27.5   Platelets 150 - 400 K/uL 218  228  250      Liver Panel    Latest Ref Rng & Units 11/28/2022    5:05 AM 11/27/2022    6:21 AM 11/26/2022    4:57 AM  CMP  Glucose 70 - 99 mg/dL 67  159  93   BUN 8 - 23 mg/dL '22  23  27   '$ Creatinine 0.44 - 1.00 mg/dL 1.02  1.24  1.05   Sodium 135 - 145 mmol/L 133  128  130   Potassium 3.5 - 5.1 mmol/L 3.6  3.6  3.6   Chloride 98 - 111 mmol/L 108  99  105   CO2 22 - 32 mmol/L '19  21  17   '$ Calcium 8.9 - 10.3 mg/dL 7.5  8.0  8.0      Microbiology: 2/28 BC Group A streptococcus WC- staph aureus , gram neg rods 2/29 BC- NG so far    Assessment/Plan: Group A streptococcus bacteremia Multiple leg wounds No evidence of necrotizing fascitis On cefazolin+ linezolid  culture from the left foot wound so far staph aureus and Klebsiella- DC linezolid She has PO option- keflex on DC She will need  2 weeks of antibiotic total    Multiple skin lesions on the lower extremities- some are discrete circular R/o pemphigoid VS pemphigis with secondary infection Recommend derm as OP if these dont resolve or recur PAD stent placed in Rt common iliac artery Angioplasty to left popliteal and [place]   Afib- on amiodarone/eliquis   Anemia   PMR- pt was  on hydroxycholoquine/prednisone- not any more ?  Discussed the management with patient and son

## 2022-11-28 NOTE — Care Management Important Message (Signed)
Important Message  Patient Details  Name: Stacy [name] MRN: [id number] Date of Birth: 1927-08-04   Medicare Important Message Given:  Yes     Loann Quill 11/28/2022, 3:40 PM

## 2022-11-28 NOTE — Progress Notes (Signed)
[facility] Progress Note  Patient: Stacy [name]    [id number]  DOA: 11/22/2022     Date of Service: the patient was seen and examined on 11/28/2022  Chief Complaint  Patient presents with   Wound Check   Brief hospital course: Stacy [name] is a 89 y.o. female with medical history significant of hypertension, fibromyalgia, atrial fibrillation on anticoagulation with Eliquis,AAA history of breast cancer.  She presents to the emergency room with new left lower extremity ulcers on first and fifth digit.  Onset of symptoms few days prior.  She has previously been seen and evaluated in the past for similar lower extremity ulcers with associated cellulitis.  Her last admission was at [facility] in December 2023.  At that time, patient met SIRS criteria and required admission.  She was treated with vancomycin and cefepime.  She has since been following up with wound care clinic as outpatient.  She presented today with new extremity ulcers and associated pain.  No obvious drainage from wound site.   X-rays obtained showed no bony involvement.  Patient was initiated on IV antibiotics with cefepime and vancomycin.  No obvious leukocytosis or left shift.  ED consulted vascular surgeon Dr. Franchot Gallo who recommended initiating patient on heparin protocol pending evaluation in a.m. for possible angiogram.  This explained to patient and family.  They agree with this plan as detailed.   Assessment and Plan:  # Recurrent left lower extremity cellulitis due to severe PVD New findings of ulcers on toes and on the heel.  There is also a lesion on anterior shin.  Considering recurrence of these ulcers could be due to PVD vascular surgery consulted, plan is for angiogram when patient is stable.   Continue heparin IV infusion Due to bacteremia vascular surgery delayed angiogram mtill 3/4 Monday   # Strep pyogenes bacteremia due to left lower extremity cellulitis Started cefazolin 2 g IV every 8 hourly and started  linezolid 600 mg IV every 12 hourly Blood culture growing strep pathogen, follow sensitivity report 2/29 Repeat blood cultures collected today a.m. TTE shows LVEF 30 to 35%, moderately decreased function, global hypokinesis with severe hypokinesis of anterior and anterolateral wall.  Severe tricuspid regurgitation, and moderate to severe aortic valve stenosis.  No mention of endocarditis ID consulted for recommendation of antibiotics and duration.   # Atrial fibrillation on anticoagulation with Eliquis.  Continued amiodarone 100 mg p.o. daily home dose Discontinued metoprolol due to low blood pressure Held Eliquis for now as patient is on heparin IV infusion   # Hypotension, patient's blood pressure dropped, patient was not septic. S/p IV fluid for hydration Skipped a dose of metoprolol on 2/28, discontinued metoprolol on 2/29 due to persistent low blood pressure Started midodrine 5 mg p.o. 3 times daily with holding parameters We will continue to monitor BP and titrate medications accordingly  #Isotonic hyponatremia, unknown cause Na 138--128 Serum osmolality 283 WNL Started sodium chloride tablets  Monitor sodium level daily   # Hypokalemia, potassium repleted. # Hypomagnesemia, mag repleted. # Hypoglycemia when she was n.p.o.  Hypoglycemia protocol was followed.  Monitor CBG. Monitor electrolytes and replete as needed.   CKD stage IIIa: Stable renal function.   Aneurysmal suprarenal abdominal aorta (3.2 cm) : chronic.    Polymyalgia rheumatic/inflammatory arthritis: Patient is on chronic Plaquenil. Hold Plaquenil for now due to bacteremia, resume on discharge.  CHF: Combined diastolic and systolic: LVEF of 45 to [id number].  Not in acute exacerbation at this time. 2/28 TTE  LVEF 30 to 25%, complete report as above. BNP 562 elevated  Conjunctivitis: s/p Ofloxacin eyedrops, completed course.   Continue fresh tear as needed. 3/3 still right eye is red, started Neosporin ointment  twice daily for 7 days  Hypothyroidism: Continue on Synthroid.    Anemia of chronic disease Hb 11.3-8.9-9.1--9.6--9.0 Monitor H&H and transfuse if hemoglobin less than 7   Body mass index is 27.36 kg/m.  Interventions:      Diet: Regular diet DVT Prophylaxis: Therapeutic Anticoagulation with heparin IV infusion    Advance goals of care discussion: DNR  Family Communication: family was present at bedside, at the time of interview.  The pt provided permission to discuss medical plan with the family. Opportunity was given to ask question and all questions were answered satisfactorily.   Disposition:  Pt is from Home, admitted with left lower extremity cellulitis and PVD, vascular surgery recommended angiogram on Monday and ID rec IV antibiotics, which precludes a safe discharge. Discharge to Home vs SNF TBD, when stable, may need few days to stay. Palliative care consulted for goals of care discussion with family.   Subjective: No significant events overnight, patient was complaining of having tenderness in the left foot area, no pain at rest.  Denies any other active issues, no chest pain or palpitation no shortness of breath, no nausea vomiting, no abdominal pain.   Physical Exam: General: NAD, lying comfortably Appear in no distress, affect appropriate Eyes: PERRLA ENT: Oral Mucosa Clear, moist  Neck: no JVD,  Cardiovascular: Irregular rhythm,  Murmur audible due to mod to severe aortic valve stenosis,  Respiratory: good respiratory effort, Bilateral Air entry equal and Decreased, no Crackles, no wheezes Abdomen: Bowel Sound present, Soft and no tenderness,  Skin: no rashes Extremities: no Pedal edema, no calf tenderness, LLE lateral surface wound heaking well, ischemic, black ulceration on the great toe and lateral surface below fifth toe. Left heel unstageable ulcer, blister on the left great toe which is new since 3/2 Neurologic: without any new focal findings Gait not  checked due to patient safety concerns  Vitals:   11/27/22 1505 11/27/22 2349 11/28/22 0751 11/28/22 1128  BP: 97/64 96/64 (!) 102/55 (!) 99/42  Pulse: 76 64 72 78  Resp: '17 16 17 12  '$ Temp: 98.1 F (36.7 C) 97.8 F (36.6 C) 97.7 F (36.5 C) (!) 97.4 F (36.3 C)  TempSrc:    Oral  SpO2: 92% 100% 100% 100%  Weight:      Height:        Intake/Output Summary (Last 24 hours) at 11/28/2022 1157 Last data filed at 11/28/2022 0540 Gross per 24 hour  Intake 725.49 ml  Output 350 ml  Net 375.49 ml   Filed Weights   11/22/22 2000  Weight: 72.3 kg    Data Reviewed: I have personally reviewed and interpreted daily labs, tele strips, imagings as discussed above. I reviewed all nursing notes, pharmacy notes, vitals, pertinent old records I have discussed plan of care as described above with RN and patient/family.  CBC: Recent Labs  Lab 11/24/22 0807 11/25/22 0313 11/26/22 0457 11/27/22 0621 11/28/22 0505  WBC 5.8 6.9 7.0 7.3 7.0  HGB 9.1* 9.6* 8.8* 9.0* 9.0*  HCT 28.9* 29.9* 27.5* 27.6* 27.6*  MCV 94.8 94.0 94.5 92.3 92.3  PLT 237 247 250 228 [id number]   Basic Metabolic Panel: Recent Labs  Lab 11/24/22 0807 11/25/22 0313 11/26/22 0457 11/27/22 0621 11/28/22 0505  NA 137 133* 130* 128* 133*  K 3.2*  3.7 3.6 3.6 3.6  CL 107 105 105 99 108  CO2 22 20* 17* 21* 19*  GLUCOSE 71 92 93 159* 67*  BUN 30* 28* 27* 23 22  CREATININE 1.17* 1.12* 1.05* 1.24* 1.02*  CALCIUM 7.7* 8.0* 8.0* 8.0* 7.5*  MG 1.0* 1.7 1.7 1.5* 2.0  PHOS 3.3 3.1 2.5 2.6 2.6    Studies: No results found.  Scheduled Meds:  [MAR Hold] acetaminophen  500 mg Oral TID   [MAR Hold] amiodarone  100 mg Oral Daily   [MAR Hold] vitamin C  500 mg Oral BID   [MAR Hold] bacitracin-polymyxin b   Both Eyes BID   [MAR Hold] bisacodyl  10 mg Oral QHS   [MAR Hold] calcium carbonate  600 mg of elemental calcium Oral BID WC   Chlorhexidine Gluconate Cloth  6 each Topical Once   [MAR Hold] feeding supplement  237 mL Oral TID  BM   fentaNYL       [MAR Hold] ferrous sulfate  325 mg Oral QODAY   [MAR Hold] gabapentin  200 mg Oral TID   heparin sodium (porcine)       [MAR Hold] levothyroxine  50 mcg Oral QAC breakfast   midazolam       [MAR Hold] midodrine  5 mg Oral TID WC   [MAR Hold] multivitamin with minerals  1 tablet Oral Daily   [MAR Hold] pantoprazole  40 mg Oral BID   [MAR Hold] polyethylene glycol  17 g Oral Daily   [MAR Hold] sodium chloride  2 g Oral TID WC   [MAR Hold] zinc sulfate  220 mg Oral Daily   Continuous Infusions:  sodium chloride     [MAR Hold]  ceFAZolin (ANCEF) IV 2 g (11/28/22 0508)   heparin 1,000 Units/hr (11/28/22 1015)   [MAR Hold] linezolid (ZYVOX) IV 600 mg (11/27/22 2134)   vancomycin     PRN Meds: [MAR Hold] acetaminophen **OR** [MAR Hold] acetaminophen, [MAR Hold] albuterol, diphenhydrAMINE, famotidine, fentaNYL, [MAR Hold] fentaNYL (SUBLIMAZE) injection, fentaNYL, heparin sodium (porcine), [MAR Hold] HYDROcodone-acetaminophen, [MAR Hold]  HYDROmorphone (DILAUDID) injection, methylPREDNISolone (SOLU-MEDROL) injection, midazolam, midazolam, midazolam, [MAR Hold] ondansetron (ZOFRAN) IV, [MAR Hold] ondansetron, [MAR Hold] polyvinyl alcohol  Time spent: 35 minutes  Author: Val Riles. MD [facility] 11/28/2022 11:57 AM  To reach On-call, see care teams to locate the attending and reach out to them via [email or web address]. If 7PM-7AM, please contact night-coverage If you still have difficulty reaching the attending provider, please page the [facility] (Director on Call) for [facility] on amion for assistance.

## 2022-11-29 DIAGNOSIS — R7881 Bacteremia: Secondary | ICD-10-CM | POA: Diagnosis not present

## 2022-11-29 DIAGNOSIS — I739 Peripheral vascular disease, unspecified: Secondary | ICD-10-CM | POA: Diagnosis not present

## 2022-11-29 DIAGNOSIS — T148XXA Other injury of unspecified body region, initial encounter: Secondary | ICD-10-CM | POA: Diagnosis not present

## 2022-11-29 DIAGNOSIS — L03116 Cellulitis of left lower limb: Secondary | ICD-10-CM | POA: Diagnosis not present

## 2022-11-29 DIAGNOSIS — S91332A Puncture wound without foreign body, left foot, initial encounter: Secondary | ICD-10-CM | POA: Diagnosis not present

## 2022-11-29 LAB — CBC
HCT: 28.2 % — ABNORMAL LOW (ref 36.0–46.0)
Hemoglobin: 9.1 g/dL — ABNORMAL LOW (ref 12.0–15.0)
MCH: 29.5 pg (ref 26.0–34.0)
MCHC: 32.3 g/dL (ref 30.0–36.0)
MCV: 91.6 fL (ref 80.0–100.0)
Platelets: 201 10*3/uL (ref 150–400)
RBC: 3.08 MIL/uL — ABNORMAL LOW (ref 3.87–5.11)
RDW: 22.2 % — ABNORMAL HIGH (ref 11.5–15.5)
WBC: 7.5 10*3/uL (ref 4.0–10.5)
nRBC: 0 % (ref 0.0–0.2)

## 2022-11-29 LAB — BASIC METABOLIC PANEL
Anion gap: 8 (ref 5–15)
BUN: 25 mg/dL — ABNORMAL HIGH (ref 8–23)
CO2: 20 mmol/L — ABNORMAL LOW (ref 22–32)
Calcium: 8.4 mg/dL — ABNORMAL LOW (ref 8.9–10.3)
Chloride: 106 mmol/L (ref 98–111)
Creatinine, Ser: 1.19 mg/dL — ABNORMAL HIGH (ref 0.44–1.00)
GFR, Estimated: 43 mL/min — ABNORMAL LOW (ref >60)
Glucose, Bld: 66 mg/dL — ABNORMAL LOW (ref 70–99)
Potassium: 4.2 mmol/L (ref 3.5–5.1)
Sodium: 134 mmol/L — ABNORMAL LOW (ref 135–145)

## 2022-11-29 LAB — CULTURE, BLOOD (ROUTINE X 2)
Culture: NO GROWTH
Culture: NO GROWTH
Special Requests: ADEQUATE
Special Requests: ADEQUATE

## 2022-11-29 LAB — CORTISOL-AM, BLOOD: Cortisol - AM: 13.4 ug/dL (ref 6.7–22.6)

## 2022-11-29 LAB — GLUCOSE, CAPILLARY
Glucose-Capillary: 67 mg/dL — ABNORMAL LOW (ref 70–99)
Glucose-Capillary: 77 mg/dL (ref 70–99)
Glucose-Capillary: 65 mg/dL — ABNORMAL LOW (ref 70–99)
Glucose-Capillary: 81 mg/dL (ref 70–99)
Glucose-Capillary: 82 mg/dL (ref 70–99)
Glucose-Capillary: 79 mg/dL (ref 70–99)
Glucose-Capillary: 96 mg/dL (ref 70–99)

## 2022-11-29 LAB — MAGNESIUM: Magnesium: 1.9 mg/dL (ref 1.7–2.4)

## 2022-11-29 LAB — CORTISOL-PM, BLOOD: Cortisol - PM: 10.8 ug/dL — ABNORMAL HIGH (ref <10.0)

## 2022-11-29 LAB — PHOSPHORUS: Phosphorus: 3 mg/dL (ref 2.5–4.6)

## 2022-11-29 MED ORDER — HYDROCORTISONE 5 MG PO TABS
5.0000 mg | ORAL_TABLET | Freq: Every evening | ORAL | Status: DC
  Administered 2022-11-29 – 2022-12-02 (×3): 5 mg via ORAL
  Filled 2022-11-29 (×4): qty 1

## 2022-11-29 MED ORDER — HYDROCORTISONE 10 MG PO TABS
10.0000 mg | ORAL_TABLET | Freq: Every day | ORAL | Status: DC
  Administered 2022-11-30 – 2022-12-02 (×3): 10 mg via ORAL
  Filled 2022-11-29 (×3): qty 1

## 2022-11-29 MED ORDER — CEFAZOLIN SODIUM-DEXTROSE 2-4 GM/100ML-% IV SOLN
2.0000 g | Freq: Two times a day (BID) | INTRAVENOUS | Status: DC
  Administered 2022-11-29 – 2022-12-02 (×6): 2 g via INTRAVENOUS
  Filled 2022-11-29 (×6): qty 100

## 2022-11-29 MED ORDER — KCL IN DEXTROSE-NACL 20-5-0.9 MEQ/L-%-% IV SOLN
INTRAVENOUS | Status: DC
  Filled 2022-11-29 (×3): qty 1000

## 2022-11-29 MED ORDER — METOCLOPRAMIDE HCL 5 MG/ML IJ SOLN
5.0000 mg | Freq: Three times a day (TID) | INTRAMUSCULAR | Status: AC
  Administered 2022-11-29 – 2022-11-30 (×3): 5 mg via INTRAVENOUS
  Filled 2022-11-29 (×3): qty 2

## 2022-11-29 NOTE — Progress Notes (Signed)
Hypoglycemic Event  CBG: 67  Treatment: 4 oz juice/soda  Symptoms: None  Follow-up CBG: [id number] CBG Result:77  Possible Reasons for Event: Inadequate meal intake  Comments/MD notified: 4oz OJ given, re-took blood sugar 15 minutes later, CBG now 77    Balinda Quails

## 2022-11-29 NOTE — Progress Notes (Signed)
Date of Admission:  11/22/2022     ID: Stacy [name] is a 89 y.o. female  Principal Problem:   Cellulitis of left lower extremity Active Problems:   Bacteremia due to Streptococcus   Wound infection   PAD (peripheral artery disease) (HCC)  Son at bed side  Subjective: Pt doing better Pain legs much better Ate well   Medications:   acetaminophen  500 mg Oral TID   amiodarone  100 mg Oral Daily   apixaban  2.5 mg Oral BID   vitamin C  500 mg Oral BID   bacitracin-polymyxin b   Both Eyes BID   bisacodyl  10 mg Oral QHS   calcium carbonate  600 mg of elemental calcium Oral BID WC   clopidogrel  75 mg Oral Daily   feeding supplement  237 mL Oral TID BM   ferrous sulfate  325 mg Oral QODAY   gabapentin  200 mg Oral TID   [START ON 11/30/2022] hydrocortisone  10 mg Oral Daily   And   hydrocortisone  5 mg Oral QPM   levothyroxine  50 mcg Oral QAC breakfast   metoCLOPramide (REGLAN) injection  5 mg Intravenous Q8H   midodrine  5 mg Oral TID WC   multivitamin with minerals  1 tablet Oral Daily   pantoprazole  40 mg Oral BID   polyethylene glycol  17 g Oral Daily   povidone-iodine   Topical Daily   sodium chloride  2 g Oral TID WC   zinc sulfate  220 mg Oral Daily    Objective: Patient Vitals for the past 24 hrs:  BP Temp Pulse Resp SpO2  11/29/22 1645 (!) 97/59 98.2 F (36.8 C) 93 16 97 %  11/29/22 0735 (!) 91/58 98.3 F (36.8 C) 67 16 100 %  11/28/22 2342 108/62 97.9 F (36.6 C) 72 16 100 %      PHYSICAL EXAM:  General: Alert, cooperative, no distress, pale Head: Normocephalic, without obvious abnormality, atraumatic. Eyes:rt ectropion, lower lid blepharitis Lungs: Clear to auscultation bilaterally. No Wheezing or Rhonchi. No rales. Heart: irregular Abdomen: Soft, non-tender,not distended. Bowel sounds normal. No masses Extremities:left great toe ulcer with eschar- surrounding erythema has resolved. Bluish discoloration tip- stable Heel ulcer dry and  healing 3/4  5th toe ulcer covered with eschar 3/4   Neurologic: Grossly non-focal  Lab Results    Latest Ref Rng & Units 11/29/2022    3:34 AM 11/28/2022    5:05 AM 11/27/2022    6:21 AM  CBC  WBC 4.0 - 10.5 K/uL 7.5  7.0  7.3   Hemoglobin 12.0 - 15.0 g/dL 9.1  9.0  9.0   Hematocrit 36.0 - 46.0 % 28.2  27.6  27.6   Platelets 150 - 400 K/uL 201  218  228      Liver Panel    Latest Ref Rng & Units 11/29/2022    3:34 AM 11/28/2022    5:05 AM 11/27/2022    6:21 AM  CMP  Glucose 70 - 99 mg/dL 66  67  159   BUN 8 - 23 mg/dL '25  22  23   '$ Creatinine 0.44 - 1.00 mg/dL 1.19  1.02  1.24   Sodium 135 - 145 mmol/L 134  133  128   Potassium 3.5 - 5.1 mmol/L 4.2  3.6  3.6   Chloride 98 - 111 mmol/L 106  108  99   CO2 22 - 32 mmol/L 20  19  21  Calcium 8.9 - 10.3 mg/dL 8.4  7.5  8.0      Microbiology: 2/28 BC Group A streptococcus WC- staph aureus , gram neg rods 2/29 BC- NG so far    Assessment/Plan: Group A streptococcus bacteremia Multiple leg wounds No evidence of necrotizing fascitis On cefazolin+ linezolid  culture from the left foot wound so far staph aureus and Klebsiella- DC linezolid She has PO option- keflex adjusted to crcl  on DC She will need   antibiotic l until 12/08/22    Multiple skin lesions on the lower extremities- some are discrete circular R/o pemphigoid VS pemphigis with secondary infection Recommend derm as OP if these dont resolve or recur   PAD stent placed in Rt common iliac artery Angioplasty to left popliteal and TPtrunk   Afib- on amiodarone/eliquis   Anemia   PMR- pt was  on hydroxycholoquine/prednisone- not any more ?  Discussed the management with patient and son  ID will sign off- call if needed

## 2022-11-29 NOTE — Progress Notes (Signed)
Palliative Care Progress Note, Assessment & Plan   Patient Name: Stacy [name]       Date: 11/29/2022 DOB: May 01, 1927  Age: 89 y.o. MRN#: [id number] Attending Physician: Val Riles, MD Primary Care Physician: Dewayne Shorter, MD Admit Date: 11/22/2022  Subjective: Patient is sitting up in bed.  She acknowledges my presence and is able to make her wishes known.  She is requesting more ice chips.  Her son [name] is at bedside.  HPI: 89 y.o. female  with past medical history of HTN, fibromyalgia, A-fib (Eliquis), AAA, history of breast cancer, and recent hospitalization at [facility] (December 2023 admitted on 11/22/2022 for lower extremity ulcers with associated cellulitis new left lower extremity ulcers on first and fifth digit.    Patient is being treated for group A strep bacteremia with cefazolin and linezolid.  3/4 ptient underwent angiogram -angioplasty of left peroneal artery, angioplasty of left popliteal artery and tibioperoneal trunk, and placement of Lifestream stent to right common iliac artery.   PMT was consulted to discuss goals of care.  Summary of counseling/coordination of care: After reviewing the patient's chart and assessing the patient at bedside, I discussed symptom management and plan of care with patient and her son [name].  Symptoms assessed.  Patient endorses no pain today.  She denies discomfort.  She shares she had a "great night".  Son at bedside shares patient stated earlier that this is the first time she has not experienced pain in her legs that she can remember.  No adjustments to [facility] needed at this time.  Tylenol and hydrocodone remain available as needed for pain management.  We discussed patient's hypoglycemic events.  Son says this is very normal for patient to have a low blood  sugar in the morning and quickly recover after having breakfast.  He is requesting patient be given medications with pudding, ice cream, or [place] ice.  Ascitic food items like orange juice and apple softest tend to make the patient feel nauseated.  We again discussed goals of care.  Patient wishes to go home - though understands that home is now her [place]. We again reviewed that MOST form is applicable when patient transfers to an outside nursing facility. Patient and son's wishes are as follows:   Cardiopulmonary Resuscitation: Do Not Attempt Resuscitation (DNR/No CPR)  Medical Interventions: Limited Additional Interventions: Use medical treatment, IV fluids and cardiac monitoring as indicated, DO NOT USE intubation or mechanical ventilation. May consider use of less invasive airway support such as BiPAP or CPAP. Also provide comfort measures. Transfer to the hospital if indicated. Avoid intensive care.   Antibiotics: Antibiotics if indicated  IV Fluids: IV fluids for a defined trial period  Feeding Tube: No feeding tube     Therapeutic silence and active listening provided for patient and son to share their thoughts and emotions regarding patient's current medical status.  They are both happy that patient seems to be making progress and her pain is minimized.  PMT will continue to follow.  Physical Exam Vitals reviewed.  Constitutional:      General: She is not in acute distress.    Appearance: She is normal weight. She is not ill-appearing.  HENT:     Head: Normocephalic.     Mouth/Throat:     Mouth: Mucous membranes are moist.  Eyes:     Pupils: Pupils are equal, round, and reactive to light.  Cardiovascular:     Rate and Rhythm: Normal rate.     Pulses: Normal pulses.  Pulmonary:     Effort: Pulmonary effort is normal.  Abdominal:     Palpations: Abdomen is soft.  Musculoskeletal:     Comments: MAETC, generalized weakness  Skin:    General: Skin is warm  and dry.     Comments: LE wounds  Neurological:     Mental Status: She is alert and oriented to person, place, and time.  Psychiatric:        Mood and Affect: Mood normal.        Behavior: Behavior normal.        Thought Content: Thought content normal.        Judgment: Judgment normal.             Total Time 35 minutes   Veeda Virgo L. Ilsa Iha, FNP-BC Palliative Medicine Team Team Phone # [phone]

## 2022-11-29 NOTE — Progress Notes (Signed)
Progress Note    11/29/2022 8:41 AM 1 Day Post-Op  Subjective:  Stacy [name] is a 89 y.o. female with medical history significant of hypertension, fibromyalgia, atrial fibrillation on anticoagulation with Eliquis,AAA history of breast cancer.  She is now POD #1 from  Aortogram and selective left lower extremity angiogram with Angioplasty to left peroneal artery, left popliteal artery with tibioperoneal trunk, and stent placement to right common iliac artery.   On exam this morning patient is resting comfortably in bed. She states "this is the first time in a long time I have been able to sleep through the night without leg pain." She feels better. No complaints overnight and vitals all remain stable.     Vitals:   11/28/22 2342 11/29/22 0735  BP: 108/62 (!) 91/58  Pulse: 72 67  Resp: 16 16  Temp: 97.9 F (36.6 C) 98.3 F (36.8 C)  SpO2: 100% 100%   Physical Exam: Cardiac:  Irregular Rhythm, HX AFIB, positive Murmur due to mod to severe aortic Stenosis.  Lungs:  Clear throughout but diminished in the bases. Incisions:  Right groin with dressing clean, dry and intact. No hematoma or seroma to note.  Extremities:  Left Lower extremity warm to touch. Palpable PT pulse. No edema or tenderness. Continues to have ischemic black ulceration to great toe and lateral 5th digit area. Left heel unstageable ulcer.  Abdomen:  Positive Bowel sounds, soft, non tender non distended.  Neurologic: AAOX3, Neuro intact and follows all commands. Answers all questions appropriately.   CBC    Component Value Date/Time   WBC 7.5 11/29/2022 0334   RBC 3.08 (L) 11/29/2022 0334   HGB 9.1 (L) 11/29/2022 0334   HGB 11.8 (L) 03/06/2013 1436   HCT 28.2 (L) 11/29/2022 0334   HCT 35.7 03/06/2013 1436   PLT 201 11/29/2022 0334   PLT 249 03/06/2013 1436   MCV 91.6 11/29/2022 0334   MCV 84 03/06/2013 1436   MCH 29.5 11/29/2022 0334   MCHC 32.3 11/29/2022 0334   RDW 22.2 (H) 11/29/2022 0334   RDW 15.0 (H)  03/06/2013 1436   LYMPHSABS 1.0 09/05/2022 1809   LYMPHSABS 2.3 03/06/2013 1436   MONOABS 0.6 09/05/2022 1809   MONOABS 0.9 03/06/2013 1436   EOSABS 0.0 09/05/2022 1809   EOSABS 0.5 03/06/2013 1436   BASOSABS 0.0 09/05/2022 1809   BASOSABS 0.1 03/06/2013 1436    BMET    Component Value Date/Time   NA 134 (L) 11/29/2022 0334   NA 136 (A) 11/14/2022 0000   NA 138 03/06/2013 1436   K 4.2 11/29/2022 0334   K 4.0 03/06/2013 1436   CL 106 11/29/2022 0334   CL 103 03/06/2013 1436   CO2 20 (L) 11/29/2022 0334   CO2 29 03/06/2013 1436   GLUCOSE 66 (L) 11/29/2022 0334   GLUCOSE 90 03/06/2013 1436   BUN 25 (H) 11/29/2022 0334   BUN 18 11/14/2022 0000   BUN 18 03/06/2013 1436   CREATININE 1.19 (H) 11/29/2022 0334   CREATININE 0.83 03/06/2013 1436   CALCIUM 8.4 (L) 11/29/2022 0334   CALCIUM 9.5 03/06/2013 1436   GFRNONAA 43 (L) 11/29/2022 0334   GFRNONAA >60 03/06/2013 1436   GFRAA >60 03/06/2013 1436    INR    Component Value Date/Time   INR 2.3 (H) 11/22/2022 1612   INR 0.8 05/09/2012 2317     Intake/Output Summary (Last 24 hours) at 11/29/2022 0841 Last data filed at 11/29/2022 0400 Gross per 24 hour  Intake  200 ml  Output --  Net 200 ml     Assessment/Plan:  89 y.o. female is s/p Aortogram and selective left lower extremity angiogram with Angioplasty to left peroneal artery, left popliteal artery with tibioperoneal trunk, and stent placement to right common iliac artery.  1 Day Post-Op Patient is recovering as expected. Reports no pain in left lower extremity this morning.    PLAN:  Convert Heparin Drip back to patients prior anticoagulation Plavix 75 mg Daily and Eliquis 2.5 mg BID.  OOB to chair today Wound care evaluation to left lower extremity now that blood flow restored.   DVT prophylaxis:  Plavix 75 mg Daily and Eliquis 2.5 mg BID   Drema Pry [facility] 11/29/2022 8:41 AM

## 2022-11-29 NOTE — Progress Notes (Addendum)
[facility] Progress Note  Patient: Stacy [name]    [id number]  DOA: 11/22/2022     Date of Service: the patient was seen and examined on 11/29/2022  Chief Complaint  Patient presents with   Wound Check   Brief hospital course: RAMEEN CHRYST is a 89 y.o. female with medical history significant of hypertension, fibromyalgia, atrial fibrillation on anticoagulation with Eliquis,AAA history of breast cancer.  She presents to the emergency room with new left lower extremity ulcers on first and fifth digit.  Onset of symptoms few days prior.  She has previously been seen and evaluated in the past for similar lower extremity ulcers with associated cellulitis.  Her last admission was at [facility] in December 2023.  At that time, patient met SIRS criteria and required admission.  She was treated with vancomycin and cefepime.  She has since been following up with wound care clinic as outpatient.  She presented today with new extremity ulcers and associated pain.  No obvious drainage from wound site.   X-rays obtained showed no bony involvement.  Patient was initiated on IV antibiotics with cefepime and vancomycin.  No obvious leukocytosis or left shift.  ED consulted vascular surgeon Dr. Franchot Gallo who recommended initiating patient on heparin protocol pending evaluation in a.m. for possible angiogram.  This explained to patient and family.  They agree with this plan as detailed.   Assessment and Plan:  # Recurrent left lower extremity cellulitis due to severe PVD New findings of necrotic ulcers on toes and on the heel.  There is also a lesion on anterior shin.  Considering recurrence of these ulcers could be due to PVD vascular surgery consulted, s/p right common iliac artery stent placement and left lower extremity angioplasty left popliteal and TPtrunk was done on 11/28/2022.  Started Plavix 75 mg p.o. daily and continued Eliquis 2.5 mg p.o. twice daily home dose.  Patient was cleared from vascular surgery  point of view to discharge and follow-up as an outpatient in 1 to 2 weeks.  # Strep pyogenes bacteremia due to left lower extremity cellulitis Started cefazolin 2 g IV every 8 hourly and s/p linezolid 600 mg IV q12, d/c'd on  2/27 blood culture grew strep pyogenes, pansensitive. 2/28 left foot wound culture MSSA and Klebsiella 2/29 Repeat blood cultures NGTD TTE shows LVEF 30 to 35%, moderately decreased function, global hypokinesis with severe hypokinesis of anterior and anterolateral wall.  Severe tricuspid regurgitation, and moderate to severe aortic valve stenosis.  No mention of endocarditis ID consulted, recommended p.o. cefazolin on discharge for total 2 weeks course.  We will continue antibiotics till 12/08/2022   # Hypoglycemia when she was n.p.o.  Hypoglycemia protocol was followed.  Monitor CBG. 3/5 hypoglycemia episodes, could be cortisol deficiency. 3/5 started hydrocortisone 10 mg in the a.m. and 5 mg in the p.m. Follow a.m. and p.m. cortisol level Decreased appetite and nausea/vomiting.  Started Reglan 5 mg IV 3 times daily x 3 doses.  # Hypotension, patient's blood pressure dropped, patient was not septic. S/p IV fluid for hydration Skipped a dose of metoprolol on 2/28, discontinued metoprolol on 2/29 due to persistent low blood pressure Started midodrine 5 mg p.o. 3 times daily with holding parameters We will continue to monitor BP and titrate medications accordingly   # Atrial fibrillation on anticoagulation with Eliquis.  Continued amiodarone 100 mg p.o. daily home dose Discontinued metoprolol due to low blood pressure S/p heparin IV infusion, resumed Eliquis on 3/5    #  Isotonic hyponatremia, unknown cause Na 138--128--134 Serum osmolality 283 WNL Started sodium chloride tablets  Monitor sodium level daily  # Hypokalemia, potassium repleted. # Hypomagnesemia, mag repleted. Monitor electrolytes and replete as needed.   CKD stage IIIa: Stable renal function.    Aneurysmal suprarenal abdominal aorta (3.2 cm) : chronic.    Polymyalgia rheumatic/inflammatory arthritis: Patient is on chronic Plaquenil. Hold Plaquenil for now due to bacteremia, resume on discharge.  CHF: Combined diastolic and systolic: LVEF of 45 to [id number].  Not in acute exacerbation at this time. 2/28 TTE LVEF 30 to 25%, complete report as above. BNP 562 elevated  Conjunctivitis: s/p Ofloxacin eyedrops, completed course.   Continue fresh tear as needed. 3/3 still right eye is red, started Neosporin ointment twice daily for 7 days  Hypothyroidism: Continue on Synthroid.    Anemia of chronic disease Hb 11.3-8.9-9.1--9.6--9.1 Monitor H&H and transfuse if hemoglobin less than 7   Body mass index is 27.36 kg/m.  Interventions:      Diet: Regular diet DVT Prophylaxis: Therapeutic Anticoagulation with heparin IV infusion    Advance goals of care discussion: DNR  Family Communication: family was present at bedside, at the time of interview.  The pt provided permission to discuss medical plan with the family. Opportunity was given to ask question and all questions were answered satisfactorily.   Disposition:  Pt is from Home, admitted with left lower extremity cellulitis and PVD, vascular surgery recommended angiogram on Monday and ID rec IV antibiotics, which precludes a safe discharge. Discharge to Home vs SNF TBD, when stable, may need few days to stay. Palliative care consulted for goals of care discussion with family.   Subjective: No significant events overnight, patient stated that she is feeling a lot better, slept well last night and has significantly less pain in the left foot.  Only heel is hurting a little bit more.  Denies any chest pain or palpitation, no shortness of breath, no any other active issues. Patient was sleepy, still having hypoglycemic episodes, cortisol level ordered. Patient still has poor appetite and more prone to develop nausea and vomiting  after oral ingestion as per patient's family at bedside.  Could be due to more gastric acid.   Physical Exam: General: NAD, lying comfortably Appear in no distress, affect appropriate Eyes: PERRLA ENT: Oral Mucosa Clear, moist  Neck: no JVD,  Cardiovascular: Irregular rhythm,  Murmur audible due to mod to severe aortic valve stenosis,  Respiratory: good respiratory effort, Bilateral Air entry equal and Decreased, no Crackles, no wheezes Abdomen: Bowel Sound present, Soft and no tenderness,  Skin: no rashes Extremities: no Pedal edema, no calf tenderness, LLE lateral surface wound heaking well, ischemic, black ulceration on the great toe and lateral surface below fifth toe. Left heel unstageable ulcer, blister on the left great toe which is new since 3/2 Neurologic: without any new focal findings Gait not checked due to patient safety concerns  Vitals:   11/28/22 1410 11/28/22 1559 11/28/22 2342 11/29/22 0735  BP: 103/69 102/68 108/62 (!) 91/58  Pulse: 72 82 72 67  Resp: '20 17 16 16  '$ Temp: 97.7 F (36.5 C) (!) 97.5 F (36.4 C) 97.9 F (36.6 C) 98.3 F (36.8 C)  TempSrc:      SpO2: 100% 100% 100% 100%  Weight:      Height:        Intake/Output Summary (Last 24 hours) at 11/29/2022 1352 Last data filed at 11/29/2022 1055 Gross per 24 hour  Intake 300  ml  Output --  Net 300 ml   Filed Weights   11/22/22 2000  Weight: 72.3 kg    Data Reviewed: I have personally reviewed and interpreted daily labs, tele strips, imagings as discussed above. I reviewed all nursing notes, pharmacy notes, vitals, pertinent old records I have discussed plan of care as described above with RN and patient/family.  CBC: Recent Labs  Lab 11/25/22 0313 11/26/22 0457 11/27/22 0621 11/28/22 0505 11/29/22 0334  WBC 6.9 7.0 7.3 7.0 7.5  HGB 9.6* 8.8* 9.0* 9.0* 9.1*  HCT 29.9* 27.5* 27.6* 27.6* 28.2*  MCV 94.0 94.5 92.3 92.3 91.6  PLT 247 250 228 [id number]   Basic Metabolic Panel: Recent Labs   Lab 11/25/22 0313 11/26/22 0457 11/27/22 0621 11/28/22 0505 11/29/22 0334  NA 133* 130* 128* 133* 134*  K 3.7 3.6 3.6 3.6 4.2  CL 105 105 99 108 106  CO2 20* 17* 21* 19* 20*  GLUCOSE 92 93 159* 67* 66*  BUN 28* 27* 23 22 25*  CREATININE 1.12* 1.05* 1.24* 1.02* 1.19*  CALCIUM 8.0* 8.0* 8.0* 7.5* 8.4*  MG 1.7 1.7 1.5* 2.0 1.9  PHOS 3.1 2.5 2.6 2.6 3.0    Studies: No results found.  Scheduled Meds:  acetaminophen  500 mg Oral TID   amiodarone  100 mg Oral Daily   apixaban  2.5 mg Oral BID   vitamin C  500 mg Oral BID   bacitracin-polymyxin b   Both Eyes BID   bisacodyl  10 mg Oral QHS   calcium carbonate  600 mg of elemental calcium Oral BID WC   clopidogrel  75 mg Oral Daily   feeding supplement  237 mL Oral TID BM   ferrous sulfate  325 mg Oral QODAY   gabapentin  200 mg Oral TID   levothyroxine  50 mcg Oral QAC breakfast   midodrine  5 mg Oral TID WC   multivitamin with minerals  1 tablet Oral Daily   pantoprazole  40 mg Oral BID   polyethylene glycol  17 g Oral Daily   povidone-iodine   Topical Daily   sodium chloride  2 g Oral TID WC   zinc sulfate  220 mg Oral Daily   Continuous Infusions:   ceFAZolin (ANCEF) IV 2 g (11/29/22 1309)   dextrose 5 % and 0.9 % NaCl with KCl 20 mEq/L 50 mL/hr at 11/29/22 1301   PRN Meds: acetaminophen **OR** acetaminophen, albuterol, fentaNYL (SUBLIMAZE) injection, HYDROcodone-acetaminophen, HYDROmorphone (DILAUDID) injection, ondansetron (ZOFRAN) IV, ondansetron, polyvinyl alcohol  Time spent: 55 minutes  Author: Val Riles. MD [facility] 11/29/2022 1:52 PM  To reach On-call, see care teams to locate the attending and reach out to them via [email or web address]. If 7PM-7AM, please contact night-coverage If you still have difficulty reaching the attending provider, please page the [facility] (Director on Call) for [facility] on amion for assistance.

## 2022-11-29 NOTE — Progress Notes (Signed)
Hypoglycemic Event  CBG: 65  Treatment: 4 oz juice/soda  Symptoms: None  Follow-up CBG: Time: 2015 CBG Result:67  Possible Reasons for Event: Inadequate meal intake  Comments/MD notified: 4oz OJ given after 2nd results. Re-took glucose levels 15 minutes after. CBG is now 83.    Stacy [name]

## 2022-11-30 DIAGNOSIS — T148XXA Other injury of unspecified body region, initial encounter: Secondary | ICD-10-CM | POA: Diagnosis not present

## 2022-11-30 DIAGNOSIS — L03116 Cellulitis of left lower limb: Secondary | ICD-10-CM | POA: Diagnosis not present

## 2022-11-30 DIAGNOSIS — S91332A Puncture wound without foreign body, left foot, initial encounter: Secondary | ICD-10-CM | POA: Diagnosis not present

## 2022-11-30 DIAGNOSIS — I739 Peripheral vascular disease, unspecified: Secondary | ICD-10-CM | POA: Diagnosis not present

## 2022-11-30 LAB — GLUCOSE, CAPILLARY
Glucose-Capillary: 101 mg/dL — ABNORMAL HIGH (ref 70–99)
Glucose-Capillary: 74 mg/dL (ref 70–99)
Glucose-Capillary: 82 mg/dL (ref 70–99)
Glucose-Capillary: 86 mg/dL (ref 70–99)
Glucose-Capillary: 98 mg/dL (ref 70–99)

## 2022-11-30 LAB — CBC
HCT: 27.7 % — ABNORMAL LOW (ref 36.0–46.0)
Hemoglobin: 8.9 g/dL — ABNORMAL LOW (ref 12.0–15.0)
MCH: 30 pg (ref 26.0–34.0)
MCHC: 32.1 g/dL (ref 30.0–36.0)
MCV: 93.3 fL (ref 80.0–100.0)
Platelets: 178 10*3/uL (ref 150–400)
RBC: 2.97 MIL/uL — ABNORMAL LOW (ref 3.87–5.11)
RDW: 22.5 % — ABNORMAL HIGH (ref 11.5–15.5)
WBC: 8.6 10*3/uL (ref 4.0–10.5)
nRBC: 0 % (ref 0.0–0.2)

## 2022-11-30 LAB — BASIC METABOLIC PANEL
Anion gap: 6 (ref 5–15)
BUN: 28 mg/dL — ABNORMAL HIGH (ref 8–23)
CO2: 20 mmol/L — ABNORMAL LOW (ref 22–32)
Calcium: 8.4 mg/dL — ABNORMAL LOW (ref 8.9–10.3)
Chloride: 108 mmol/L (ref 98–111)
Creatinine, Ser: 1.23 mg/dL — ABNORMAL HIGH (ref 0.44–1.00)
GFR, Estimated: 41 mL/min — ABNORMAL LOW (ref >60)
Glucose, Bld: 77 mg/dL (ref 70–99)
Potassium: 4.6 mmol/L (ref 3.5–5.1)
Sodium: 134 mmol/L — ABNORMAL LOW (ref 135–145)

## 2022-11-30 LAB — PHOSPHORUS: Phosphorus: 3.1 mg/dL (ref 2.5–4.6)

## 2022-11-30 LAB — MAGNESIUM: Magnesium: 1.9 mg/dL (ref 1.7–2.4)

## 2022-11-30 MED ORDER — HYDROCORTISONE 1 % EX CREA
TOPICAL_CREAM | Freq: Two times a day (BID) | CUTANEOUS | Status: DC
  Filled 2022-11-30: qty 28

## 2022-11-30 MED ORDER — MAGIC MOUTHWASH W/LIDOCAINE: 5.0000 mL | Freq: Two times a day (BID) | ORAL | Status: DC | PRN

## 2022-11-30 MED ORDER — DEXTROSE-NACL 5-0.9 % IV SOLN: INTRAVENOUS | Status: DC

## 2022-11-30 MED ORDER — MAGIC MOUTHWASH W/LIDOCAINE: 5.0000 mL | Freq: Three times a day (TID) | ORAL | Status: DC | PRN

## 2022-11-30 MED ORDER — METOCLOPRAMIDE HCL 5 MG PO TABS
5.0000 mg | ORAL_TABLET | Freq: Three times a day (TID) | ORAL | Status: DC
  Administered 2022-11-30 – 2022-12-02 (×7): 5 mg via ORAL
  Filled 2022-11-30 (×7): qty 1

## 2022-11-30 NOTE — Progress Notes (Signed)
Palliative Care Progress Note, Assessment & Plan   Patient Name: Stacy [name]       Date: 11/30/2022 DOB: May 11, 1927  Age: 89 y.o. MRN#: [id number] Attending Physician: Val Riles, MD Primary Care Physician: Dewayne Shorter, MD Admit Date: 11/22/2022  Subjective: Patient is sitting up in bed.  She is able to acknowledge my presence and make her wishes known.  She has no acute complaints of pain.  She endorses she had a "great night".  [name] is at bedside.  HPI: 89 y.o. female  with past medical history of HTN, fibromyalgia, A-fib (Eliquis), AAA, history of breast cancer, and recent hospitalization at [facility] (December 2023 admitted on 11/22/2022 for lower extremity ulcers with associated cellulitis new left lower extremity ulcers on first and fifth digit.    Patient is being treated for group A strep bacteremia with cefazolin and linezolid.  3/4 ptient underwent angiogram -angioplasty of left peroneal artery, angioplasty of left popliteal artery and tibioperoneal trunk, and placement of Lifestream stent to right common iliac artery.   PMT was consulted to discuss goals of care.  Summary of counseling/coordination of care: After reviewing the patient's chart and assessing the patient at bedside, I discussed plan and goals of care with patient and her son.  Son shares that he has counseled with his other siblings in regards to MOST form documentation.  Family plans to be at the hospital later today and is hopeful to complete MOST form at this time.  Questions and concerns were addressed.  Offered to be present for completion of MOST if appropriate.  Symptom burden is low.  Patient did not utilize any as needed pain medications overnight.  Her pain appears to be well-controlled with scheduled medications.   No adjustment to [facility] needed at this time. Plan remains for patient to return to LTC.  Outpatient palliative services discussed.  Patient and family in agreement with outpatient palliative to continue to follow her at discharge.  DNR remains.  PMT will continue to follow.  Physical Exam Vitals reviewed.  Constitutional:      General: She is not in acute distress.    Appearance: She is normal weight. She is not ill-appearing.  HENT:     Head: Normocephalic.     Mouth/Throat:     Mouth: Mucous membranes are moist.  Eyes:     Pupils: Pupils are equal, round, and reactive to light.  Cardiovascular:     Rate and Rhythm: Normal rate. Rhythm irregular.     Pulses: Normal pulses.  Pulmonary:     Effort: Pulmonary effort is normal.  Abdominal:     Palpations: Abdomen is soft.  Musculoskeletal:     Comments: Generalized weakness  Skin:    General: Skin is warm and dry.     Coloration: Skin is pale.  Neurological:     Mental Status: She is alert and oriented to person, place, and time.  Psychiatric:        Mood and Affect: Mood normal.        Behavior: Behavior normal.        Thought Content: Thought content normal.        Judgment: Judgment normal.  Total Time 25 minutes   Josian Lanese L. Ilsa Iha, FNP-BC Palliative Medicine Team Team Phone # [phone]

## 2022-11-30 NOTE — Progress Notes (Signed)
Progress Note    11/30/2022 10:04 AM 2 Days Post-Op  Subjective:   Stacy [name] is a 89 y.o. female with medical history significant of hypertension, fibromyalgia, atrial fibrillation on anticoagulation with Eliquis,AAA history of breast cancer.  She is now POD #2 from  Aortogram and selective left lower extremity angiogram with Angioplasty to left peroneal artery, left popliteal artery with tibioperoneal trunk, and stent placement to right common iliac artery.   On exam this morning patient is resting comfortably in bed.  Patient is recovering as expected.  Lower extremities remain pain-free and patient has been able to sleep.   Vitals:   11/30/22 0916 11/30/22 0918  BP: 92/71 92/71  Pulse: 94 94  Resp: 16 16  Temp: 98.1 F (36.7 C) 98.1 F (36.7 C)  SpO2: (!) 88% (!) 88%   Physical Exam: Cardiac:   Irregular Rhythm, HX AFIB, positive Murmur due to mod to severe aortic Stenosis.  Lungs:  Clear throughout but diminished in the bases.  Incisions:   Right groin with dressing clean, dry and intact. No hematoma or seroma to note.   Extremities:  Left Lower extremity warm to touch. Palpable PT pulse. No edema or tenderness. Continues to have ischemic black ulceration to great toe and lateral 5th digit area. Left heel unstageable ulcer.   Abdomen:   Positive Bowel sounds, soft, non tender non distended.   Neurologic:  AAOX3, Neuro intact and follows all commands. Answers all questions appropriately.   CBC    Component Value Date/Time   WBC 8.6 11/30/2022 0518   RBC 2.97 (L) 11/30/2022 0518   HGB 8.9 (L) 11/30/2022 0518   HGB 11.8 (L) 03/06/2013 1436   HCT 27.7 (L) 11/30/2022 0518   HCT 35.7 03/06/2013 1436   PLT 178 11/30/2022 0518   PLT 249 03/06/2013 1436   MCV 93.3 11/30/2022 0518   MCV 84 03/06/2013 1436   MCH 30.0 11/30/2022 0518   MCHC 32.1 11/30/2022 0518   RDW 22.5 (H) 11/30/2022 0518   RDW 15.0 (H) 03/06/2013 1436   LYMPHSABS 1.0 09/05/2022 1809   LYMPHSABS 2.3  03/06/2013 1436   MONOABS 0.6 09/05/2022 1809   MONOABS 0.9 03/06/2013 1436   EOSABS 0.0 09/05/2022 1809   EOSABS 0.5 03/06/2013 1436   BASOSABS 0.0 09/05/2022 1809   BASOSABS 0.1 03/06/2013 1436    BMET    Component Value Date/Time   NA 134 (L) 11/30/2022 0518   NA 136 (A) 11/14/2022 0000   NA 138 03/06/2013 1436   K 4.6 11/30/2022 0518   K 4.0 03/06/2013 1436   CL 108 11/30/2022 0518   CL 103 03/06/2013 1436   CO2 20 (L) 11/30/2022 0518   CO2 29 03/06/2013 1436   GLUCOSE 77 11/30/2022 0518   GLUCOSE 90 03/06/2013 1436   BUN 28 (H) 11/30/2022 0518   BUN 18 11/14/2022 0000   BUN 18 03/06/2013 1436   CREATININE 1.23 (H) 11/30/2022 0518   CREATININE 0.83 03/06/2013 1436   CALCIUM 8.4 (L) 11/30/2022 0518   CALCIUM 9.5 03/06/2013 1436   GFRNONAA 41 (L) 11/30/2022 0518   GFRNONAA >60 03/06/2013 1436   GFRAA >60 03/06/2013 1436    INR    Component Value Date/Time   INR 2.3 (H) 11/22/2022 1612   INR 0.8 05/09/2012 2317     Intake/Output Summary (Last 24 hours) at 11/30/2022 1004 Last data filed at 11/29/2022 1800 Gross per 24 hour  Intake 433.96 ml  Output --  Net 433.96 ml  Assessment/Plan:  89 y.o. female is s/p Aortogram and selective left lower extremity angiogram with Angioplasty to left peroneal artery, left popliteal artery with tibioperoneal trunk, and stent placement to right common iliac artery.   2 Days Post-Op   PLAN: OOB to chair today Wound care evaluation to left lower extremity now that blood flow restored.  Vascular Surgery is okay with patient discharge back to [facility]. No other interventions planned at this time.   DVT prophylaxis:  Eliquis 2.5 mg BID   Drema Pry [facility] 11/30/2022 10:04 AM

## 2022-11-30 NOTE — Progress Notes (Signed)
[facility] Progress Note  Patient: Stacy [name]    [id number]  DOA: 11/22/2022     Date of Service: the patient was seen and examined on 11/30/2022  Chief Complaint  Patient presents with   Wound Check   Brief hospital course: Stacy [name] is a 89 y.o. female with medical history significant of hypertension, fibromyalgia, atrial fibrillation on anticoagulation with Eliquis,AAA history of breast cancer.  She presents to the emergency room with new left lower extremity ulcers on first and fifth digit.  Onset of symptoms few days prior.  She has previously been seen and evaluated in the past for similar lower extremity ulcers with associated cellulitis.  Her last admission was at [facility] in December 2023.  At that time, patient met SIRS criteria and required admission.  She was treated with vancomycin and cefepime.  She has since been following up with wound care clinic as outpatient.  She presented today with new extremity ulcers and associated pain.  No obvious drainage from wound site.   X-rays obtained showed no bony involvement.  Patient was initiated on IV antibiotics with cefepime and vancomycin.  No obvious leukocytosis or left shift.  ED consulted vascular surgeon Dr. Franchot Gallo who recommended initiating patient on heparin protocol pending evaluation in a.m. for possible angiogram.  This explained to patient and family.  They agree with this plan as detailed.   Assessment and Plan:  # Recurrent left lower extremity cellulitis due to severe PVD New findings of necrotic ulcers on toes and on the heel.  There is also a lesion on anterior shin.  Considering recurrence of these ulcers could be due to PVD vascular surgery consulted, s/p right common iliac artery stent placement and left lower extremity angioplasty left popliteal and TPtrunk was done on 11/28/2022.  Started Plavix 75 mg p.o. daily and continued Eliquis 2.5 mg p.o. twice daily home dose.  Patient was cleared from vascular surgery  point of view to discharge and follow-up as an outpatient in 1 to 2 weeks.  # Strep pyogenes bacteremia due to left lower extremity cellulitis Started cefazolin 2 g IV every 8 hourly and s/p linezolid 600 mg IV q12, d/c'd on  2/27 blood culture grew strep pyogenes, pansensitive. 2/28 left foot wound culture MSSA and Klebsiella 2/29 Repeat blood cultures NGTD TTE shows LVEF 30 to 35%, moderately decreased function, global hypokinesis with severe hypokinesis of anterior and anterolateral wall.  Severe tricuspid regurgitation, and moderate to severe aortic valve stenosis.  No mention of endocarditis ID consulted, recommended p.o. Keflex on discharge for total 2 weeks course.  We will continue antibiotics till 12/08/2022   # Hypoglycemia when she was n.p.o.  Hypoglycemia protocol was followed.  Monitor CBG. 3/5 hypoglycemia episodes, most likely due to poor oral intake 3/5 started hydrocortisone 10 mg in the a.m. and 5 mg in the p.m. Cortisol level 13.4 at 9:48 AM and 10.8 at 3:28 pm, wnl Decreased appetite and nausea/vomiting.  Started Reglan 5 mg IV 3 times daily x 3 doses. 3/6 started Reglan 5 mg po TID, skip the dose if no N/V  # Hypotension, patient's blood pressure dropped, patient was not septic. S/p IV fluid for hydration Skipped a dose of metoprolol on 2/28, discontinued metoprolol on 2/29 due to persistent low blood pressure Started midodrine 5 mg p.o. 3 times daily with holding parameters We will continue to monitor BP and titrate medications accordingly Continue IVF for gentle hydration  # Atrial fibrillation on anticoagulation with Eliquis.  Continued amiodarone 100 mg p.o. daily home dose Discontinued metoprolol due to low blood pressure S/p heparin IV infusion, resumed Eliquis on 3/5    #Isotonic hyponatremia, unknown cause Na 138--128--134 Serum osmolality 283 WNL Started sodium chloride tablets  Monitor sodium level daily  # Hypokalemia, potassium repleted. #  Hypomagnesemia, mag repleted. Monitor electrolytes and replete as needed.   CKD stage IIIa: Stable renal function.   Aneurysmal suprarenal abdominal aorta (3.2 cm) : chronic.    Polymyalgia rheumatic/inflammatory arthritis: Patient is on chronic Plaquenil. Hold Plaquenil for now due to bacteremia, resume on discharge.  CHF: Combined diastolic and systolic: LVEF of 45 to [id number].  Not in acute exacerbation at this time. 2/28 TTE LVEF 30 to 25%, complete report as above. BNP 562 elevated  Conjunctivitis: s/p Ofloxacin eyedrops, completed course.   Continue fresh tear as needed. 3/3 still right eye is red, started Neosporin ointment twice daily for 7 days 3/6 conjunctivitis is improving   Dermatitis, Right forehead noticed on 3/6 Apply topical corticosteroid cream twice daily for few days  Hypothyroidism: Continue on Synthroid.    Anemia of chronic disease Hb 11.3-8.9-9.1--9.6--9.1 Monitor H&H and transfuse if hemoglobin less than 7   Body mass index is 27.36 kg/m.  Interventions:     Diet: Regular diet DVT Prophylaxis: Therapeutic Anticoagulation with heparin IV infusion    Advance goals of care discussion: DNR  Family Communication: family was present at bedside, at the time of interview.  The pt provided permission to discuss medical plan with the family. Opportunity was given to ask question and all questions were answered satisfactorily.   Disposition:  Pt is from Home, admitted with left lower extremity cellulitis and PVD, s/p angioplasty and right common iliac stent, on IV antibiotics for bacteremia, still has low blood pressure and poor appetite, we will watch 1 more day.  Plan is to discharge to SNF in 1 to 2 days.     Subjective: No significant events overnight, patient's pain is getting better, denies any active issues.  Patient still has some feeling of nausea but she felt improvement after getting Reglan yesterday as per patient's son.  Patient's appetite is  getting better she did eat in the morning.   Physical Exam: General: NAD, lying comfortably Appear in no distress, affect appropriate Eyes: PERRLA ENT: Oral Mucosa Clear, moist  Neck: no JVD,  Cardiovascular: Irregular rhythm,  Murmur audible due to mod to severe aortic valve stenosis,  Respiratory: good respiratory effort, Bilateral Air entry equal and Decreased, no Crackles, no wheezes Abdomen: Bowel Sound present, Soft and no tenderness,  Skin: no rashes Extremities: no Pedal edema, no calf tenderness, LLE lateral surface wound heaking well, ischemic, black ulceration on the great toe and lateral surface below fifth toe. Left heel unstageable ulcer, blister on the left great toe which is new since 3/2 Neurologic: without any new focal findings Gait not checked due to patient safety concerns  Vitals:   11/29/22 1645 11/29/22 2219 11/30/22 0916 11/30/22 0918  BP: (!) 97/59 1'08/73 92/71 92/71 '$  Pulse: 93 81 94 94  Resp: '16 15 16 16  '$ Temp: 98.2 F (36.8 C) 97.8 F (36.6 C) 98.1 F (36.7 C) 98.1 F (36.7 C)  TempSrc:      SpO2: 97% 100% (!) 88% (!) 88%  Weight:      Height:        Intake/Output Summary (Last 24 hours) at 11/30/2022 1324 Last data filed at 11/30/2022 1049 Gross per 24 hour  Intake  573.96 ml  Output --  Net 573.96 ml   Filed Weights   11/22/22 2000  Weight: 72.3 kg    Data Reviewed: I have personally reviewed and interpreted daily labs, tele strips, imagings as discussed above. I reviewed all nursing notes, pharmacy notes, vitals, pertinent old records I have discussed plan of care as described above with RN and patient/family.  CBC: Recent Labs  Lab 11/26/22 0457 11/27/22 0621 11/28/22 0505 11/29/22 0334 11/30/22 0518  WBC 7.0 7.3 7.0 7.5 8.6  HGB 8.8* 9.0* 9.0* 9.1* 8.9*  HCT 27.5* 27.6* 27.6* 28.2* 27.7*  MCV 94.5 92.3 92.3 91.6 93.3  PLT 250 228 218 201 0000000   Basic Metabolic Panel: Recent Labs  Lab 11/26/22 0457 11/27/22 0621  11/28/22 0505 11/29/22 0334 11/30/22 0518  NA 130* 128* 133* 134* 134*  K 3.6 3.6 3.6 4.2 4.6  CL 105 99 108 106 108  CO2 17* 21* 19* 20* 20*  GLUCOSE 93 159* 67* 66* 77  BUN 27* 23 22 25* 28*  CREATININE 1.05* 1.24* 1.02* 1.19* 1.23*  CALCIUM 8.0* 8.0* 7.5* 8.4* 8.4*  MG 1.7 1.5* 2.0 1.9 1.9  PHOS 2.5 2.6 2.6 3.0 3.1    Studies: No results found.  Scheduled Meds:  acetaminophen  500 mg Oral TID   amiodarone  100 mg Oral Daily   apixaban  2.5 mg Oral BID   vitamin C  500 mg Oral BID   bacitracin-polymyxin b   Both Eyes BID   bisacodyl  10 mg Oral QHS   calcium carbonate  600 mg of elemental calcium Oral BID WC   clopidogrel  75 mg Oral Daily   ferrous sulfate  325 mg Oral QODAY   gabapentin  200 mg Oral TID   hydrocortisone  10 mg Oral Daily   And   hydrocortisone  5 mg Oral QPM   hydrocortisone cream   Topical BID   levothyroxine  50 mcg Oral QAC breakfast   metoCLOPramide  5 mg Oral TID AC   midodrine  5 mg Oral TID WC   multivitamin with minerals  1 tablet Oral Daily   pantoprazole  40 mg Oral BID   polyethylene glycol  17 g Oral Daily   povidone-iodine   Topical Daily   zinc sulfate  220 mg Oral Daily   Continuous Infusions:   ceFAZolin (ANCEF) IV 2 g (11/30/22 0226)   dextrose 5 % and 0.9% NaCl 75 mL/hr at 11/30/22 1206   PRN Meds: acetaminophen **OR** acetaminophen, albuterol, fentaNYL (SUBLIMAZE) injection, HYDROcodone-acetaminophen, HYDROmorphone (DILAUDID) injection, ondansetron (ZOFRAN) IV, ondansetron, polyvinyl alcohol  Time spent: 50 minutes  Author: Val Riles. MD [facility] 11/30/2022 1:24 PM  To reach On-call, see care teams to locate the attending and reach out to them via [email or web address]. If 7PM-7AM, please contact night-coverage If you still have difficulty reaching the attending provider, please page the [facility] (Director on Call) for [facility] on amion for assistance.

## 2022-11-30 NOTE — Progress Notes (Signed)
Nutrition Follow-up  DOCUMENTATION CODES:   Not applicable  INTERVENTION:   -Continue Magic cup TID with meals, each supplement provides 290 kcal and 9 grams of protein  -Continue MVI with minerals daily -Continue 500 mg vitamin C BID -Continue 220 mg zinc sulfate daily x 14 days -Liberalize diet to regular for wider variety of meal selections -D/c Ensure Enlive po TID, each supplement provides 350 kcal and 20 grams of protein -Obtain new wt  NUTRITION DIAGNOSIS:   Increased nutrient needs related to wound healing as evidenced by estimated needs.  Ongoing  GOAL:   Patient will meet greater than or equal to 90% of their needs  Progressing   MONITOR:   PO intake, Supplement acceptance  REASON FOR ASSESSMENT:   Malnutrition Screening Tool    ASSESSMENT:   Pt with medical history significant of hypertension, fibromyalgia, atrial fibrillation on anticoagulation with Eliquis,AAA history of breast cancer.  She presents with new left lower extremity ulcers on first and fifth digit.  3/4- s/p aortogram  Reviewed I/O's: +434 ml x 24 hours and +6.2 L since admission   Pt with improved oral intake. Noted meal completions 60-100%. Pt is varied acceptance of Ensure supplements; per discussion with family pt also did not drink supplements much PTA, even when they were offered. Diet was changed to carb modified. Noted that pt continues to have good glycemic control and is not on any DM medications currently. RD will liberalize diet to allow for wider variety of meal selections.   No new wt since admission. RD will order another weight to assess changes.   Medications reviewed and include vitamin C, dulcolax, calcium carbonate, ferrous sulfate, miralax, and zinc sulfate.   Palliative care following for goals of care discussions.   Labs reviewed: Na: 134, CBGS: 74-86 (inpatient orders for glycemic control are none).    Diet Order:   Diet Order             Diet Carb Modified  Fluid consistency: Thin; Room service appropriate? Yes  Diet effective now                   EDUCATION NEEDS:   Education needs have been addressed  Skin:  Skin Assessment: Skin Integrity Issues: Skin Integrity Issues:: Other (Comment) Other: nonpressure wounds to lt great toe, lt heel, 5th toe, and lt pretibial; IAD to coccyx  Last BM:  11/22/22  Height:   Ht Readings from Last 1 Encounters:  11/22/22 '5\' 4"'$  (1.626 m)    Weight:   Wt Readings from Last 1 Encounters:  11/22/22 72.3 kg    Ideal Body Weight:  54.5 kg  BMI:  Body mass index is 27.36 kg/m.  Estimated Nutritional Needs:   Kcal:  1950-2150  Protein:  90-105 grams  Fluid:  > 1.9 L    Loistine Chance, RD, LDN, [place] Registered Dietitian II Certified Diabetes Care and Education Specialist Please refer to [facility] for RD and/or RD on-call/weekend/after hours pager

## 2022-12-01 DIAGNOSIS — I739 Peripheral vascular disease, unspecified: Secondary | ICD-10-CM | POA: Diagnosis not present

## 2022-12-01 DIAGNOSIS — L03116 Cellulitis of left lower limb: Secondary | ICD-10-CM | POA: Diagnosis not present

## 2022-12-01 DIAGNOSIS — R7881 Bacteremia: Secondary | ICD-10-CM | POA: Diagnosis not present

## 2022-12-01 DIAGNOSIS — S91332A Puncture wound without foreign body, left foot, initial encounter: Secondary | ICD-10-CM | POA: Diagnosis not present

## 2022-12-01 LAB — CBC
HCT: 27.3 % — ABNORMAL LOW (ref 36.0–46.0)
Hemoglobin: 9 g/dL — ABNORMAL LOW (ref 12.0–15.0)
MCH: 30.9 pg (ref 26.0–34.0)
MCHC: 33 g/dL (ref 30.0–36.0)
MCV: 93.8 fL (ref 80.0–100.0)
Platelets: 164 10*3/uL (ref 150–400)
RBC: 2.91 MIL/uL — ABNORMAL LOW (ref 3.87–5.11)
RDW: 22.2 % — ABNORMAL HIGH (ref 11.5–15.5)
WBC: 7.6 10*3/uL (ref 4.0–10.5)
nRBC: 0 % (ref 0.0–0.2)

## 2022-12-01 LAB — GLUCOSE, CAPILLARY
Glucose-Capillary: 101 mg/dL — ABNORMAL HIGH (ref 70–99)
Glucose-Capillary: 74 mg/dL (ref 70–99)
Glucose-Capillary: 78 mg/dL (ref 70–99)
Glucose-Capillary: 81 mg/dL (ref 70–99)
Glucose-Capillary: 82 mg/dL (ref 70–99)
Glucose-Capillary: 90 mg/dL (ref 70–99)

## 2022-12-01 LAB — BASIC METABOLIC PANEL
Anion gap: 6 (ref 5–15)
BUN: 29 mg/dL — ABNORMAL HIGH (ref 8–23)
CO2: 20 mmol/L — ABNORMAL LOW (ref 22–32)
Calcium: 8.3 mg/dL — ABNORMAL LOW (ref 8.9–10.3)
Chloride: 110 mmol/L (ref 98–111)
Creatinine, Ser: 1.22 mg/dL — ABNORMAL HIGH (ref 0.44–1.00)
GFR, Estimated: 41 mL/min — ABNORMAL LOW (ref >60)
Glucose, Bld: 85 mg/dL (ref 70–99)
Potassium: 4.2 mmol/L (ref 3.5–5.1)
Sodium: 136 mmol/L (ref 135–145)

## 2022-12-01 MED ORDER — NYSTATIN 100000 UNIT/ML MT SUSP
5.0000 mL | Freq: Four times a day (QID) | OROMUCOSAL | Status: DC
  Administered 2022-12-01 – 2022-12-02 (×5): 500000 [IU] via ORAL
  Filled 2022-12-01 (×7): qty 5

## 2022-12-01 MED ORDER — MIDODRINE HCL 5 MG PO TABS
10.0000 mg | ORAL_TABLET | Freq: Three times a day (TID) | ORAL | Status: DC
  Administered 2022-12-01 – 2022-12-02 (×6): 10 mg via ORAL
  Filled 2022-12-01 (×6): qty 2

## 2022-12-01 NOTE — Progress Notes (Signed)
[facility] Progress Note  Patient: Stacy [name]    [id number]  DOA: 11/22/2022     Date of Service: the patient was seen and examined on 12/01/2022  Chief Complaint  Patient presents with   Wound Check   Brief hospital course: Stacy [name] is a 89 y.o. female with medical history significant of hypertension, fibromyalgia, atrial fibrillation on anticoagulation with Eliquis,AAA history of breast cancer.  She presents to the emergency room with new left lower extremity ulcers on first and fifth digit.  Onset of symptoms few days prior.  She has previously been seen and evaluated in the past for similar lower extremity ulcers with associated cellulitis.  Her last admission was at [facility] in December 2023.  At that time, patient met SIRS criteria and required admission.  She was treated with vancomycin and cefepime.  She has since been following up with wound care clinic as outpatient.  She presented today with new extremity ulcers and associated pain.  No obvious drainage from wound site.   X-rays obtained showed no bony involvement.  Patient was initiated on IV antibiotics with cefepime and vancomycin.  No obvious leukocytosis or left shift.  ED consulted vascular surgeon Dr. Franchot Gallo who recommended initiating patient on heparin protocol pending evaluation in a.m. for possible angiogram.  This explained to patient and family.  They agree with this plan as detailed.   Assessment and Plan:  # Recurrent left lower extremity cellulitis due to severe PVD New findings of necrotic ulcers on toes and on the heel.  There is also a lesion on anterior shin.  Considering recurrence of these ulcers could be due to PVD vascular surgery consulted, s/p right common iliac artery stent placement and left lower extremity angioplasty left popliteal and T[place]unk was done on 11/28/2022.  Started Plavix 75 mg p.o. daily and continued Eliquis 2.5 mg p.o. twice daily home dose.  Patient was cleared from vascular surgery  point of view to discharge and follow-up as an outpatient in 1 to 2 weeks.  # Strep pyogenes bacteremia due to left lower extremity cellulitis Started cefazolin 2 g IV every 8 hourly and s/p linezolid 600 mg IV q12, d/c'd on  2/27 blood culture grew strep pyogenes, pansensitive. 2/28 left foot wound culture MSSA and Klebsiella 2/29 Repeat blood cultures NGTD TTE shows LVEF 30 to 35%, moderately decreased function, global hypokinesis with severe hypokinesis of anterior and anterolateral wall.  Severe tricuspid regurgitation, and moderate to severe aortic valve stenosis.  No mention of endocarditis ID consulted, recommended p.o. Keflex on discharge for total 2 weeks course.  We will continue antibiotics till 12/08/2022   # Hypoglycemia when she was n.p.o.  Hypoglycemia protocol was followed.  Monitor CBG. 3/5 hypoglycemia episodes, most likely due to poor oral intake 3/5 started hydrocortisone 10 mg in the a.m. and 5 mg in the p.m. Cortisol level 13.4 at 9:48 AM and 10.8 at 3:28 pm, wnl Decreased appetite and nausea/vomiting.  Started Reglan 5 mg IV 3 times daily x 3 doses. 3/6 started Reglan 5 mg po TID, skip the dose if no N/V 3/7 discontinue IV fluids today and monitor CBG and BP before disposition plan  # Hypotension, patient's blood pressure dropped, patient was not septic. S/p IV fluid for hydration Skipped a dose of metoprolol on 2/28, discontinued metoprolol on 2/29 due to persistent low blood pressure 3/7 increased midodrine 10 mg p.o. 3 times daily with holding parameters We will continue to monitor BP and titrate medications accordingly  Continue IVF for gentle hydration  # Atrial fibrillation on anticoagulation with Eliquis.  Continued amiodarone 100 mg p.o. daily home dose Discontinued metoprolol due to low blood pressure S/p heparin IV infusion, resumed Eliquis on 3/5    #Isotonic hyponatremia, unknown cause Na 138--128--134--136 resolved Serum osmolality 283 WNL Started  sodium chloride tablets  Monitor sodium level daily  # Hypokalemia, potassium repleted. # Hypomagnesemia, mag repleted. Monitor electrolytes and replete as needed.   CKD stage IIIa: Stable renal function. 1.22, slightly elevated could be due to dehydration S/p gentle hydration, encourage oral fluid intake  Aneurysmal suprarenal abdominal aorta (3.2 cm) : chronic.    Polymyalgia rheumatic/inflammatory arthritis: Patient is on chronic Plaquenil. Hold Plaquenil for now due to bacteremia, resume on discharge.  CHF: Combined diastolic and systolic: LVEF of 45 to [id number].  Not in acute exacerbation at this time. 2/28 TTE LVEF 30 to 35%, complete report as above. BNP 562 elevated  Conjunctivitis: s/p Ofloxacin eyedrops, completed course.   Continue fresh tear as needed. 3/3 still right eye is red, started Neosporin ointment twice daily for 7 days 3/6 conjunctivitis is improving  Mouth sore could be oral thrush, started nystatin oral swish and swallow and Magic mouthwash.   Dermatitis, Right forehead noticed on 3/6 Apply topical corticosteroid cream twice daily for few days  Hypothyroidism: Continue on Synthroid.    Anemia of chronic disease Hb 11.3-8.9-9.1--9.6--9.0 Monitor H&H and transfuse if hemoglobin less than 7   Body mass index is 27.36 kg/m.  Interventions:     Diet: Regular diet DVT Prophylaxis: Therapeutic Anticoagulation with heparin IV infusion    Advance goals of care discussion: DNR  Family Communication: family was present at bedside, at the time of interview.  The pt provided permission to discuss medical plan with the family. Opportunity was given to ask question and all questions were answered satisfactorily.   Disposition:  Pt is from Home, admitted with left lower extremity cellulitis and PVD, s/p angioplasty and right common iliac stent, on IV antibiotics for bacteremia, still has low blood pressure, low sugar and poor appetite, discontinued IV fluids  today, we will monitor BP and CBG without IV fluids and then plan for discharge to SNF most likely tomorrow a.m.     Subjective: No significant events overnight, patient slept well overnight, pain is under control.  Still has poor appetite, dermatitis on the forehead is getting better.  Patient was complaining of burning of mouth which may be causing her decreased food intake.  Started on nystatin swish and swallow for possible oral thrush. Discontinued IV fluid, we will monitor BP and CBG today without fluids and plan for disposition tomorrow a.m.   Physical Exam: General: NAD, lying comfortably Appear in no distress, affect appropriate Eyes: PERRLA ENT: Oral Mucosa Clear, moist  Neck: no JVD,  Cardiovascular: Irregular rhythm,  Murmur audible due to mod to severe aortic valve stenosis,  Respiratory: good respiratory effort, Bilateral Air entry equal and Decreased, no Crackles, no wheezes Abdomen: Bowel Sound present, Soft and no tenderness,  Skin: no rashes Extremities: no Pedal edema, no calf tenderness, LLE lateral surface wound heaking well, ischemic, black ulceration on the great toe and lateral surface below fifth toe. Left heel unstageable ulcer, blister on the left great toe which is new since 3/2 Neurologic: without any new focal findings Gait not checked due to patient safety concerns  Vitals:   11/30/22 1552 11/30/22 2311 12/01/22 0107 12/01/22 0749  BP: (!) 83/55 (!) 87/51 (!) 99/58 99/73  Pulse: 80 83 78 79  Resp: '16 15 15 18  '$ Temp: 98.2 F (36.8 C) 98.1 F (36.7 C) 98 F (36.7 C) 97.9 F (36.6 C)  TempSrc:      SpO2: 95% 95% 100% 100%  Weight:      Height:        Intake/Output Summary (Last 24 hours) at 12/01/2022 1409 Last data filed at 11/30/2022 1929 Gross per 24 hour  Intake 240 ml  Output 200 ml  Net 40 ml   Filed Weights   11/22/22 2000  Weight: 72.3 kg    Data Reviewed: I have personally reviewed and interpreted daily labs, tele strips, imagings as  discussed above. I reviewed all nursing notes, pharmacy notes, vitals, pertinent old records I have discussed plan of care as described above with RN and patient/family.  CBC: Recent Labs  Lab 11/27/22 0621 11/28/22 0505 11/29/22 0334 11/30/22 0518 12/01/22 0309  WBC 7.3 7.0 7.5 8.6 7.6  HGB 9.0* 9.0* 9.1* 8.9* 9.0*  HCT 27.6* 27.6* 28.2* 27.7* 27.3*  MCV 92.3 92.3 91.6 93.3 93.8  PLT 228 218 201 178 [id number]   Basic Metabolic Panel: Recent Labs  Lab 11/26/22 0457 11/27/22 0621 11/28/22 0505 11/29/22 0334 11/30/22 0518 12/01/22 0309  NA 130* 128* 133* 134* 134* 136  K 3.6 3.6 3.6 4.2 4.6 4.2  CL 105 99 108 106 108 110  CO2 17* 21* 19* 20* 20* 20*  GLUCOSE 93 159* 67* 66* 77 85  BUN 27* 23 22 25* 28* 29*  CREATININE 1.05* 1.24* 1.02* 1.19* 1.23* 1.22*  CALCIUM 8.0* 8.0* 7.5* 8.4* 8.4* 8.3*  MG 1.7 1.5* 2.0 1.9 1.9  --   PHOS 2.5 2.6 2.6 3.0 3.1  --     Studies: No results found.  Scheduled Meds:  acetaminophen  500 mg Oral TID   amiodarone  100 mg Oral Daily   apixaban  2.5 mg Oral BID   vitamin C  500 mg Oral BID   bacitracin-polymyxin b   Both Eyes BID   bisacodyl  10 mg Oral QHS   calcium carbonate  600 mg of elemental calcium Oral BID WC   clopidogrel  75 mg Oral Daily   ferrous sulfate  325 mg Oral QODAY   gabapentin  200 mg Oral TID   hydrocortisone  10 mg Oral Daily   And   hydrocortisone  5 mg Oral QPM   hydrocortisone cream   Topical BID   levothyroxine  50 mcg Oral QAC breakfast   metoCLOPramide  5 mg Oral TID AC   midodrine  10 mg Oral TID WC   multivitamin with minerals  1 tablet Oral Daily   nystatin  5 mL Oral QID   pantoprazole  40 mg Oral BID   polyethylene glycol  17 g Oral Daily   povidone-iodine   Topical Daily   zinc sulfate  220 mg Oral Daily   Continuous Infusions:   ceFAZolin (ANCEF) IV 2 g (12/01/22 0215)   PRN Meds: acetaminophen **OR** acetaminophen, albuterol, fentaNYL (SUBLIMAZE) injection, HYDROcodone-acetaminophen,  HYDROmorphone (DILAUDID) injection, magic mouthwash w/lidocaine, ondansetron (ZOFRAN) IV, ondansetron, polyvinyl alcohol  Time spent: 50 minutes  Author: Val Riles. MD [facility] 12/01/2022 2:09 PM  To reach On-call, see care teams to locate the attending and reach out to them via [email or web address]. If 7PM-7AM, please contact night-coverage If you still have difficulty reaching the attending provider, please page the [facility] (Director on Call) for [facility] on amion for assistance.

## 2022-12-01 NOTE — Progress Notes (Signed)
Palliative Care Progress Note, Assessment & Plan   Patient Name: Stacy [name]       Date: 12/01/2022 DOB: 09-17-1927  Age: 89 y.o. MRN#: [id number] Attending Physician: Stacy Riles, MD Primary Care Physician: Stacy Shorter, MD Admit Date: 11/22/2022  Subjective: Patient is sitting up in bed in no apparent distress.  She acknowledges my presence and is able to make her wishes known.  She denies pain or discomfort at this time.  She is saying she is hungry and breakfast tray is at bedside.  [name] is at bedside  HPI: 89 y.o. female  with past medical history of HTN, fibromyalgia, A-fib (Eliquis), AAA, history of breast cancer, and recent hospitalization at [facility] (December 2023 admitted on 11/22/2022 for lower extremity ulcers with associated cellulitis new left lower extremity ulcers on first and fifth digit.    Patient is being treated for group A strep bacteremia with cefazolin and linezolid.  3/4 patient underwent angiogram -angioplasty of left peroneal artery, angioplasty of left popliteal artery and tibioperoneal trunk, and placement of Lifestream stent to right common iliac artery.   PMT was consulted to discuss goals of care.  Summary of counseling/coordination of care: After reviewing the patient's chart and assessing the patient at bedside, I discussed plan and goals of care with patient and son.  Stacy [name] shares that he, his brother Stacy [name], and his Sister Stacy [name] discussed MOST form yesterday afternoon.  MOST form is completed at bedside.  I reviewed MOST form choices with patient and Stacy [name].  Patient and family's wishes are as follows:   Cardiopulmonary Resuscitation: Do Not Attempt Resuscitation (DNR/No CPR)  Medical Interventions: Limited Additional Interventions: Use medical treatment, IV  fluids and cardiac monitoring as indicated, DO NOT USE intubation or mechanical ventilation. May consider use of less invasive airway support such as BiPAP or CPAP. Also provide comfort measures. Transfer to the hospital if indicated. Avoid intensive care.   Antibiotics: Antibiotics if indicated  IV Fluids: IV fluids if indicated  Feeding Tube: No feeding tube     MOST form signed and hard copy placed in shadow chart.   Stacy [name] shares plan is for patient to be monitored for one more day and then for discharge back to LTC at [facility].  Goals are clear.  Symptom burden is low.  Current regimen appears to be appropriately managing patient's symptoms.  No adjustments to [facility] needed at this time.  PMT will remain available to patient and family.  PMT will shadow the patient's chart and monitor her peripherally.    Please re-engage with PMT if goals change, if at patient/family's request, or if patient's health deteriorates during hospitalization.  Patient and son have PMT contact information and were encouraged to call with any future palliative needs.  Physical Exam Vitals reviewed.  Constitutional:      General: She is not in acute distress.    Appearance: She is not ill-appearing.  HENT:     Head: Normocephalic.     Mouth/Throat:     Mouth: Mucous membranes are moist.  Eyes:     Pupils: Pupils are equal, round, and reactive to light.  Cardiovascular:     Rate and Rhythm: Normal rate.  Rhythm irregular.     Pulses: Normal pulses.  Pulmonary:     Effort: Pulmonary effort is normal.  Abdominal:     Palpations: Abdomen is soft.  Musculoskeletal:     Comments: Generalized weakness  Skin:    General: Skin is warm and dry.     Coloration: Skin is pale.  Neurological:     Mental Status: She is alert and oriented to person, place, and time.  Psychiatric:        Mood and Affect: Mood normal.        Behavior: Behavior normal.        Thought Content: Thought content normal.         Judgment: Judgment normal.             Total Time 50 minutes   Stacy [name] L. Ilsa Iha, FNP-BC Palliative Medicine Team Team Phone # [phone]

## 2022-12-01 NOTE — Care Management Important Message (Signed)
Important Message  Patient Details  Name: Stacy [name] MRN: [id number] Date of Birth: 1927-02-17   Medicare Important Message Given:  Yes  Patient and family member asleep in room upon time of visit.  Copy of Medicare IM left in room on counter for reference.    Dannette Barbara 12/01/2022, 2:25 PM

## 2022-12-02 DIAGNOSIS — B955 Unspecified streptococcus as the cause of diseases classified elsewhere: Secondary | ICD-10-CM | POA: Diagnosis not present

## 2022-12-02 DIAGNOSIS — J9611 Chronic respiratory failure with hypoxia: Secondary | ICD-10-CM | POA: Diagnosis not present

## 2022-12-02 DIAGNOSIS — N1831 Chronic kidney disease, stage 3a: Secondary | ICD-10-CM | POA: Diagnosis not present

## 2022-12-02 DIAGNOSIS — K219 Gastro-esophageal reflux disease without esophagitis: Secondary | ICD-10-CM | POA: Diagnosis not present

## 2022-12-02 DIAGNOSIS — M6281 Muscle weakness (generalized): Secondary | ICD-10-CM | POA: Diagnosis not present

## 2022-12-02 DIAGNOSIS — M797 Fibromyalgia: Secondary | ICD-10-CM | POA: Diagnosis not present

## 2022-12-02 DIAGNOSIS — B741 Filariasis due to Brugia malayi: Secondary | ICD-10-CM | POA: Diagnosis not present

## 2022-12-02 DIAGNOSIS — I4819 Other persistent atrial fibrillation: Secondary | ICD-10-CM | POA: Diagnosis not present

## 2022-12-02 DIAGNOSIS — I130 Hypertensive heart and chronic kidney disease with heart failure and stage 1 through stage 4 chronic kidney disease, or unspecified chronic kidney disease: Secondary | ICD-10-CM | POA: Diagnosis not present

## 2022-12-02 DIAGNOSIS — J449 Chronic obstructive pulmonary disease, unspecified: Secondary | ICD-10-CM | POA: Diagnosis not present

## 2022-12-02 DIAGNOSIS — R4189 Other symptoms and signs involving cognitive functions and awareness: Secondary | ICD-10-CM | POA: Diagnosis not present

## 2022-12-02 DIAGNOSIS — R7881 Bacteremia: Secondary | ICD-10-CM | POA: Diagnosis not present

## 2022-12-02 DIAGNOSIS — I70245 Atherosclerosis of native arteries of left leg with ulceration of other part of foot: Secondary | ICD-10-CM | POA: Diagnosis not present

## 2022-12-02 DIAGNOSIS — I739 Peripheral vascular disease, unspecified: Secondary | ICD-10-CM | POA: Diagnosis not present

## 2022-12-02 DIAGNOSIS — I959 Hypotension, unspecified: Secondary | ICD-10-CM | POA: Diagnosis not present

## 2022-12-02 DIAGNOSIS — I5022 Chronic systolic (congestive) heart failure: Secondary | ICD-10-CM | POA: Diagnosis not present

## 2022-12-02 DIAGNOSIS — A491 Streptococcal infection, unspecified site: Secondary | ICD-10-CM | POA: Diagnosis not present

## 2022-12-02 DIAGNOSIS — I4811 Longstanding persistent atrial fibrillation: Secondary | ICD-10-CM | POA: Diagnosis not present

## 2022-12-02 DIAGNOSIS — E039 Hypothyroidism, unspecified: Secondary | ICD-10-CM | POA: Diagnosis not present

## 2022-12-02 DIAGNOSIS — D649 Anemia, unspecified: Secondary | ICD-10-CM | POA: Diagnosis not present

## 2022-12-02 DIAGNOSIS — D696 Thrombocytopenia, unspecified: Secondary | ICD-10-CM | POA: Diagnosis not present

## 2022-12-02 DIAGNOSIS — M353 Polymyalgia rheumatica: Secondary | ICD-10-CM | POA: Diagnosis not present

## 2022-12-02 DIAGNOSIS — L089 Local infection of the skin and subcutaneous tissue, unspecified: Secondary | ICD-10-CM | POA: Diagnosis not present

## 2022-12-02 DIAGNOSIS — Z853 Personal history of malignant neoplasm of breast: Secondary | ICD-10-CM | POA: Diagnosis not present

## 2022-12-02 DIAGNOSIS — N179 Acute kidney failure, unspecified: Secondary | ICD-10-CM | POA: Diagnosis not present

## 2022-12-02 DIAGNOSIS — R2689 Other abnormalities of gait and mobility: Secondary | ICD-10-CM | POA: Diagnosis not present

## 2022-12-02 DIAGNOSIS — L97522 Non-pressure chronic ulcer of other part of left foot with fat layer exposed: Secondary | ICD-10-CM | POA: Diagnosis not present

## 2022-12-02 DIAGNOSIS — R278 Other lack of coordination: Secondary | ICD-10-CM | POA: Diagnosis not present

## 2022-12-02 DIAGNOSIS — Z743 Need for continuous supervision: Secondary | ICD-10-CM | POA: Diagnosis not present

## 2022-12-02 DIAGNOSIS — I5020 Unspecified systolic (congestive) heart failure: Secondary | ICD-10-CM | POA: Diagnosis not present

## 2022-12-02 DIAGNOSIS — M1990 Unspecified osteoarthritis, unspecified site: Secondary | ICD-10-CM | POA: Diagnosis not present

## 2022-12-02 DIAGNOSIS — I5023 Acute on chronic systolic (congestive) heart failure: Secondary | ICD-10-CM | POA: Diagnosis not present

## 2022-12-02 DIAGNOSIS — I4891 Unspecified atrial fibrillation: Secondary | ICD-10-CM | POA: Diagnosis not present

## 2022-12-02 DIAGNOSIS — I714 Abdominal aortic aneurysm, without rupture, unspecified: Secondary | ICD-10-CM | POA: Diagnosis not present

## 2022-12-02 DIAGNOSIS — L03116 Cellulitis of left lower limb: Secondary | ICD-10-CM | POA: Diagnosis not present

## 2022-12-02 LAB — BASIC METABOLIC PANEL
Anion gap: 6 (ref 5–15)
BUN: 31 mg/dL — ABNORMAL HIGH (ref 8–23)
CO2: 20 mmol/L — ABNORMAL LOW (ref 22–32)
Calcium: 8.8 mg/dL — ABNORMAL LOW (ref 8.9–10.3)
Chloride: 109 mmol/L (ref 98–111)
Creatinine, Ser: 1.24 mg/dL — ABNORMAL HIGH (ref 0.44–1.00)
GFR, Estimated: 41 mL/min — ABNORMAL LOW (ref >60)
Glucose, Bld: 94 mg/dL (ref 70–99)
Potassium: 4.2 mmol/L (ref 3.5–5.1)
Sodium: 135 mmol/L (ref 135–145)

## 2022-12-02 LAB — CBC
HCT: 30.6 % — ABNORMAL LOW (ref 36.0–46.0)
Hemoglobin: 9.6 g/dL — ABNORMAL LOW (ref 12.0–15.0)
MCH: 30.3 pg (ref 26.0–34.0)
MCHC: 31.4 g/dL (ref 30.0–36.0)
MCV: 96.5 fL (ref 80.0–100.0)
Platelets: 165 10*3/uL (ref 150–400)
RBC: 3.17 MIL/uL — ABNORMAL LOW (ref 3.87–5.11)
RDW: 22 % — ABNORMAL HIGH (ref 11.5–15.5)
WBC: 8.8 10*3/uL (ref 4.0–10.5)
nRBC: 0.2 % (ref 0.0–0.2)

## 2022-12-02 LAB — PHOSPHORUS: Phosphorus: 3 mg/dL (ref 2.5–4.6)

## 2022-12-02 LAB — MAGNESIUM: Magnesium: 1.7 mg/dL (ref 1.7–2.4)

## 2022-12-02 LAB — GLUCOSE, CAPILLARY
Glucose-Capillary: 60 mg/dL — ABNORMAL LOW (ref 70–99)
Glucose-Capillary: 69 mg/dL — ABNORMAL LOW (ref 70–99)
Glucose-Capillary: 109 mg/dL — ABNORMAL HIGH (ref 70–99)
Glucose-Capillary: 75 mg/dL (ref 70–99)
Glucose-Capillary: 74 mg/dL (ref 70–99)

## 2022-12-02 MED ORDER — CLOPIDOGREL BISULFATE 75 MG PO TABS: 75.0000 mg | ORAL_TABLET | Freq: Every day | ORAL | 0 refills | Status: DC

## 2022-12-02 MED ORDER — DEXTROSE 50 % IV SOLN
12.5000 g | INTRAVENOUS | Status: AC
  Administered 2022-12-02: 12.5 g via INTRAVENOUS
  Filled 2022-12-02: qty 50

## 2022-12-02 MED ORDER — MIDODRINE HCL 10 MG PO TABS: 10.0000 mg | ORAL_TABLET | Freq: Three times a day (TID) | ORAL | 0 refills | Status: AC

## 2022-12-02 MED ORDER — CEPHALEXIN 500 MG PO CAPS: 500.0000 mg | ORAL_CAPSULE | Freq: Three times a day (TID) | ORAL | 0 refills | Status: AC

## 2022-12-02 NOTE — TOC Progression Note (Signed)
Transition of Care [facility]) - Progression Note    Patient Details  Name: Stacy [name] MRN: [id number] Date of Birth: [birth year]  Transition of Care [facility]) CM/SW [place], RN Phone Number: 12/02/2022, 12:42 PM  Clinical Narrative:   Patient is stable for discharge today.  Patient will be returning back to [facility] today.  [facility] will transport patient to facility today.    Seth Bake, Admission staff at [facility] made aware that patient will be returning to facility today. Seth Bake reports that patient's room number is 301 and number to call report is [phone].  I have spoken with [name] Mrs. Hodgens's daughter and made her aware of Mrs. Boxell discharge back to [facility] today.    SNF Report, Discharge Summary, FL2, and Discharge orders faxed to [facility] with [facility].      Expected Discharge Plan: [place] Barriers to Discharge: Continued Medical Work up  Expected Discharge Plan and Services       Living arrangements for the past 2 months: Single Family Home Expected Discharge Date: 12/02/22                                     Social Determinants of Health (SDOH) Interventions SDOH Screenings   Food Insecurity: No Food Insecurity (11/22/2022)  Housing: Low Risk  (11/22/2022)  Transportation Needs: No Transportation Needs (11/22/2022)  Utilities: Not At Risk (11/22/2022)  Alcohol Screen: Low Risk  (02/04/2019)  Depression (PHQ2-9): Low Risk  (08/30/2022)  Financial Resource Strain: Low Risk  (04/07/2021)  Tobacco Use: Low Risk  (11/28/2022)    Readmission Risk Interventions    11/28/2022   10:20 AM  Readmission Risk Prevention Plan  Transportation Screening Complete  PCP or Specialist Appt within 3-5 Days Complete  Social Work Consult for [place] Planning/Counseling [place] Not Applicable  Medication Review Press photographer) Referral to Pharmacy

## 2022-12-02 NOTE — Discharge Summary (Addendum)
Physician Discharge Summary   Patient: Stacy [name] MRN: [id number] DOB: [birth year]  Admit date:     11/22/2022  Discharge date: 12/02/22  Discharge Physician: Lucienne Minks    PCP: Dewayne Shorter, MD   Recommendations at discharge:    Please complete the course of oral antibx (last day is 12/08/2022)  Discharge Diagnoses: Principal Problem:   Cellulitis of left lower extremity Active Problems:   Bacteremia due to Streptococcus   Wound infection   PAD (peripheral artery disease) ([place])  Resolved Problems:   * No resolved hospital problems. *  Hospital Course: Stacy [name] is a 89 y.o. female with medical history significant of hypertension, fibromyalgia, atrial fibrillation on anticoagulation with Eliquis,AAA history of breast cancer who presented with new extremity ulcers and associated pain.  No obvious drainage from wound site.   X-rays obtained showed no bony involvement.  Patient was initiated on IV antibiotics with cefepime and vancomycin.  No obvious leukocytosis or left shift.  ED consulted vascular surgeon Dr. Franchot Gallo who recommended initiating patient on heparin protocol pending evaluation in a.m. for possible angiogram.  This explained to patient and family.  They agree with this plan as detailed.  Vascular performed/placed a right common iliac artery stent placement and left lower extremity angioplasty left popliteal and [place] was done on 11/28/2022. Started Plavix 75 mg p.o. daily and continued Eliquis 2.5 mg p.o. twice daily home dose. Patient was cleared from vascular surgery point of view to discharge and follow-up as an outpatient in 1 to 2 weeks.   She also had strep pyogenes bacteremia and was on IV cefazolin. ID was consulted and recommended completing the course of oral antibx's on 12/08/2022 (ID consulted, recommended p.o. Keflex on discharge for total 2 weeks course. We will continue antibiotics till 12/08/2022).  In terms of the pt's hypoglycemia the pt needs to  maintain strong oral intake. While here in the hospital, the pt was eating well and consistently maintained her sugar above 100. However, when she does not eat she runs into hypoglycemia. Thus, when the pt returns to her facility, the staff at the facility need to encourage the pt to eat.  Assessment and Plan:     Consultants: ID, vascular surgery  Procedures performed: As above (stenting)  Disposition: Skilled nursing facility Diet recommendation:  Carb modified diet DISCHARGE MEDICATION: Allergies as of 12/02/2022       Reactions   Aspirin Itching, Other (See Comments)   Dizzy/passing out   Augmentin [amoxicillin-pot Clavulanate]    Codeine Other (See Comments)   dizziness   Doxycycline Hyclate Other (See Comments)   Sulfamethoxazole-trimethoprim Diarrhea   Tape Itching   Vitamin D Analogs    Azithromycin Rash, Hives        Medication List     TAKE these medications    acetaminophen 500 MG tablet Commonly known as: TYLENOL Take 500 mg by mouth 3 (three) times daily.   amiodarone 100 MG tablet Commonly known as: PACERONE Take 100 mg by mouth daily.   apixaban 2.5 MG Tabs tablet Commonly known as: ELIQUIS Take 1 tablet (2.5 mg total) by mouth 2 (two) times daily.   calcium carbonate 1500 (600 Ca) MG Tabs tablet Commonly known as: OSCAL Take 600 mg of elemental calcium by mouth 2 (two) times daily with a meal.   cephALEXin 500 MG capsule Commonly known as: KEFLEX Take 1 capsule (500 mg total) by mouth 3 (three) times daily for 6 days.   ciprofloxacin 0.3 % ophthalmic  solution Commonly known as: CILOXAN Place 1 drop into both eyes every 4 (four) hours while awake.   clopidogrel 75 MG tablet Commonly known as: PLAVIX Take 1 tablet (75 mg total) by mouth daily. Start taking on: December 03, 2022   DECUBI-VITE PO Take 1 tablet by mouth daily. For wound healing   Dermacloud Oint Apply to buttock wound topically every day and evening shift for skin protection    Dextromethorphan-guaiFENesin 20-400 MG Tabs Take 1 tablet by mouth 2 (two) times daily.   diclofenac Sodium 1 % Gel Commonly known as: VOLTAREN Apply 4 g topically 4 (four) times daily.   ferrous sulfate 325 (65 FE) MG EC tablet Take 325 mg by mouth every other day.   gabapentin 100 MG capsule Commonly known as: NEURONTIN Take 2 tabs 3 x day for neuropathy by mouth   hydroxychloroquine 200 MG tablet Commonly known as: PLAQUENIL Take 200 mg by mouth daily.   lactose free nutrition Liqd Take 237 mLs by mouth 3 (three) times daily between meals.   levothyroxine 50 MCG tablet Commonly known as: SYNTHROID Take 50 mcg by mouth daily before breakfast.   metoprolol tartrate 25 MG tablet Commonly known as: LOPRESSOR Take 0.5 tablets (12.5 mg total) by mouth 2 (two) times daily.   midodrine 10 MG tablet Commonly known as: PROAMATINE Take 1 tablet (10 mg total) by mouth 3 (three) times daily with meals.   mirtazapine 15 MG tablet Commonly known as: REMERON Take 15 mg by mouth at bedtime.   ondansetron 8 MG tablet Commonly known as: ZOFRAN Take 8 mg by mouth every 8 (eight) hours as needed for nausea or vomiting.   pantoprazole 40 MG tablet Commonly known as: PROTONIX TAKE 1 TABLET TWICE DAILY   sucralfate 1 GM/10ML suspension Commonly known as: CARAFATE Take 1 g by mouth 4 (four) times daily -  with meals and at bedtime.        Discharge Exam: Filed Weights   11/22/22 2000 12/01/22 1116  Weight: 72.3 kg 84.5 kg     Condition at discharge: fair  The results of significant diagnostics from this hospitalization (including imaging, microbiology, ancillary and laboratory) are listed below for reference.   Imaging Studies: PERIPHERAL VASCULAR CATHETERIZATION  Result Date: 11/28/2022 See surgical note for result.  DG Abd 1 View  Result Date: 11/26/2022 CLINICAL DATA:  Small-bowel obstruction, patient reports bowel movement EXAM: ABDOMEN - 1 VIEW COMPARISON:  None  Available. FINDINGS: Nonobstructive pattern of bowel gas scattered gas present to the descending colon. No significant burden of stool. No free air in the abdomen on supine radiographs. IMPRESSION: Nonobstructive pattern of bowel gas with scattered gas present to the descending colon. No significant burden of stool. Electronically Signed   By: Delanna Ahmadi M.D.   On: 11/26/2022 10:44   ECHOCARDIOGRAM COMPLETE  Result Date: 11/24/2022    ECHOCARDIOGRAM REPORT   Patient Name:   KAZIAH MIZUNO Date of Exam: 11/23/2022 Medical Rec #:  [id number]   Height:       64.0 in Accession #:    [id number]  Weight:       159.4 lb Date of Birth:  1927-03-13    BSA:          1.776 m Patient Age:    53 years    BP:           99/59 mmHg Patient Gender: F           HR:  101 bpm. Exam Location:  [facility] Procedure: 2D Echo, Cardiac Doppler and Color Doppler Indications:     R78.81 Bacteremia  History:         Patient has prior history of Echocardiogram examinations, most                  recent 07/04/2022. Risk Factors:Hypertension.  Sonographer:     Cresenciano Lick RDCS Referring Phys:  [id number] Val Riles Diagnosing Phys: Ida Rogue MD IMPRESSIONS  1. Left ventricular ejection fraction, by estimation, is 30 to 35%. The left ventricle has moderately decreased function. The left ventricle demonstrates global hypokinesis with severe hypokinesis of the anterior and anteroseptal wall. There is moderate  left ventricular hypertrophy. Left ventricular diastolic parameters are indeterminate.  2. Right ventricular systolic function is normal. The right ventricular size is normal. There is normal pulmonary artery systolic pressure.  3. Left atrial size was mildly dilated.  4. The mitral valve is normal in structure. Mild mitral valve regurgitation. No evidence of mitral stenosis. Moderate mitral annular calcification.  5. Tricuspid valve regurgitation is severe.  6. The aortic valve is normal in structure. There is severe  calcifcation of the aortic valve. Aortic valve regurgitation is not visualized. Moderate to severe aortic valve stenosis, though degree of stenosis may be underestimated in setting of low EF. Aortic valve area, by VTI measures 0.42 cm. Aortic valve mean gradient measures 18.6 mmHg. Aortic valve Vmax measures 2.76 m/s.  7. There is borderline dilatation of the aortic root, measuring 37 mm.  8. The inferior vena cava is normal in size with greater than 50% respiratory variability, suggesting right atrial pressure of 3 mmHg. FINDINGS  Left Ventricle: Left ventricular ejection fraction, by estimation, is 30 to 35%. The left ventricle has moderately decreased function. The left ventricle demonstrates global hypokinesis. The left ventricular internal cavity size was normal in size. There is moderate left ventricular hypertrophy. Left ventricular diastolic parameters are indeterminate. Right Ventricle: The right ventricular size is normal. No increase in right ventricular wall thickness. Right ventricular systolic function is normal. There is normal pulmonary artery systolic pressure. The tricuspid regurgitant velocity is 2.41 m/s, and  with an assumed right atrial pressure of 5 mmHg, the estimated right ventricular systolic pressure is [id number] mmHg. Left Atrium: Left atrial size was mildly dilated. Right Atrium: Right atrial size was normal in size. Pericardium: There is no evidence of pericardial effusion. Mitral Valve: The mitral valve is normal in structure. Moderate mitral annular calcification. Mild mitral valve regurgitation. No evidence of mitral valve stenosis. Tricuspid Valve: The tricuspid valve is normal in structure. Tricuspid valve regurgitation is severe. No evidence of tricuspid stenosis. Aortic Valve: The aortic valve is normal in structure. There is severe calcifcation of the aortic valve. Aortic valve regurgitation is not visualized. Moderate to severe aortic stenosis is present. Aortic valve mean gradient  measures 18.6 mmHg. Aortic valve peak gradient measures 30.4 mmHg. Aortic valve area, by VTI measures 0.42 cm. Pulmonic Valve: The pulmonic valve was normal in structure. Pulmonic valve regurgitation is mild. No evidence of pulmonic stenosis. Aorta: The aortic root is normal in size and structure. There is borderline dilatation of the aortic root, measuring 37 mm. Venous: The inferior vena cava is normal in size with greater than 50% respiratory variability, suggesting right atrial pressure of 3 mmHg. IAS/Shunts: No atrial level shunt detected by color flow Doppler.  LEFT VENTRICLE PLAX 2D LVIDd:         4.10 cm LVIDs:  3.50 cm LV PW:         1.20 cm LV IVS:        1.20 cm LVOT diam:     2.00 cm LV SV:         20 LV SV Index:   11 LVOT Area:     3.14 cm  RIGHT VENTRICLE            IVC RV Basal diam:  3.70 cm    IVC diam: 1.10 cm RV S prime:     8.77 cm/s LEFT ATRIUM             Index        RIGHT ATRIUM           Index LA diam:        4.20 cm 2.36 cm/m   RA Area:     26.60 cm LA Vol (A2C):   80.1 ml 45.09 ml/m  RA Volume:   101.00 ml 56.85 ml/m LA Vol (A4C):   62.4 ml 35.13 ml/m LA Biplane Vol: 72.0 ml 40.53 ml/m  AORTIC VALVE AV Area (Vmax):    0.47 cm AV Area (Vmean):   0.43 cm AV Area (VTI):     0.42 cm AV Vmax:           275.80 cm/s AV Vmean:          202.600 cm/s AV VTI:            0.487 m AV Peak Grad:      30.4 mmHg AV Mean Grad:      18.6 mmHg LVOT Vmax:         41.27 cm/s LVOT Vmean:        27.433 cm/s LVOT VTI:          0.064 m LVOT/AV VTI ratio: 0.13  AORTA Ao Root diam: 3.20 cm Ao Asc diam:  3.70 cm MV E velocity: 110.00 cm/s  TRICUSPID VALVE                             TR Peak grad:   23.2 mmHg                             TR Vmax:        241.00 cm/s                              SHUNTS                             Systemic VTI:  0.06 m                             Systemic Diam: 2.00 cm Ida Rogue MD Electronically signed by Ida Rogue MD Signature Date/Time: 11/24/2022/7:34:34 AM     Final    DG Chest Port 1 View  Result Date: 11/22/2022 CLINICAL DATA:  Shortness of breath EXAM: PORTABLE CHEST 1 VIEW COMPARISON:  Chest x-ray 07/03/2022 FINDINGS: There are small bilateral pleural effusions. There is no focal lung infiltrate or pneumothorax. The heart is mildly enlarged, unchanged. Severe degenerative changes affect the shoulders. IMPRESSION: Small bilateral pleural effusions. Electronically Signed   By: Ronney Asters M.D.   On: 11/22/2022 22:32  DG Foot 2 Views Left  Result Date: 11/22/2022 CLINICAL DATA:  Left foot ischemia EXAM: LEFT FOOT - 2 VIEW COMPARISON:  None Available. FINDINGS: Normal alignment. No acute fracture or dislocation. Mild degenerative changes involving the first metatarsophalangeal joint and midfoot. Osseous structures diffusely osteopenic. No erosions or abnormal periosteal reaction. Mild soft tissue swelling of the dorsum of the left forefoot. Vascular calcifications noted. IMPRESSION: 1. Mild soft tissue swelling. No acute fracture or dislocation. Electronically Signed   By: Fidela Salisbury M.D.   On: 11/22/2022 17:55    Microbiology: Results for orders placed or performed during the hospital encounter of 11/22/22  MRSA Next Gen by PCR, Nasal     Status: None   Collection Time: 11/22/22 12:22 AM   Specimen: Nasal Mucosa; Nasal Swab  Result Value Ref Range Status   MRSA by PCR Next Gen NOT DETECTED NOT DETECTED Final    Comment: (NOTE) The GeneXpert MRSA Assay (FDA approved for NASAL specimens only), is one component of a comprehensive MRSA colonization surveillance program. It is not intended to diagnose MRSA infection nor to guide or monitor treatment for MRSA infections. Test performance is not FDA approved in patients less than 19 years old. Performed at [facility], [address] [place], [place] [address]   Blood culture (routine x 2)     Status: Abnormal   Collection Time: 11/22/22  4:12 PM   Specimen: BLOOD  Result Value  Ref Range Status   Specimen Description   Final    BLOOD BLOOD RIGHT ARM Performed at [facility], [address] [place], [place] [address]    Special Requests   Final    BOTTLES DRAWN AEROBIC AND ANAEROBIC Blood Culture adequate volume Performed at [facility], [address], [place], [place] [address]    Culture  Setup Time   Final    GRAM POSITIVE COCCI IN BOTH AEROBIC AND ANAEROBIC BOTTLES CRITICAL RESULT CALLED TO, READ BACK BY AND VERIFIED WITH: JASON ROBBINS PHARMD '@0549'$  11/23/22 ASW    Culture (A)  Final    GROUP A STREP (S.PYOGENES) ISOLATED HEALTH DEPARTMENT NOTIFIED Performed at [facility], [address] [place], [place] [address]    Report Status 11/25/2022 FINAL  Final   Organism ID, Bacteria GROUP A STREP (S.PYOGENES) ISOLATED  Final      Susceptibility   Group a strep (s.pyogenes) isolated - MIC*    PENICILLIN <=0.06 SENSITIVE Sensitive     CEFTRIAXONE <=0.12 SENSITIVE Sensitive     ERYTHROMYCIN <=0.12 SENSITIVE Sensitive     LEVOFLOXACIN 0.5 SENSITIVE Sensitive     VANCOMYCIN 0.5 SENSITIVE Sensitive     * GROUP A STREP (S.PYOGENES) ISOLATED  Blood culture (routine x 2)     Status: None   Collection Time: 11/22/22  4:12 PM   Specimen: BLOOD  Result Value Ref Range Status   Specimen Description BLOOD BLOOD RIGHT HAND  Final   Special Requests   Final    BOTTLES DRAWN AEROBIC AND ANAEROBIC Blood Culture adequate volume   Culture   Final    NO GROWTH 5 DAYS Performed at [facility], [address], [place], [place] [address]    Report Status 11/27/2022 FINAL  Final  Blood Culture ID Panel (Reflexed)     Status: Abnormal   Collection Time: 11/22/22  4:12 PM  Result Value Ref Range Status   Enterococcus faecalis NOT DETECTED NOT DETECTED Final   Enterococcus Faecium NOT DETECTED NOT DETECTED  Final   Listeria monocytogenes NOT DETECTED NOT DETECTED Final   Staphylococcus species NOT DETECTED NOT DETECTED Final    Staphylococcus aureus (BCID) NOT DETECTED NOT DETECTED Final   Staphylococcus epidermidis NOT DETECTED NOT DETECTED Final   Staphylococcus lugdunensis NOT DETECTED NOT DETECTED Final   Streptococcus species DETECTED (A) NOT DETECTED Final    Comment: CRITICAL RESULT CALLED TO, READ BACK BY AND VERIFIED WITH: JASON ROBBINS PHARMD '@0549'$  11/23/22 ASW    Streptococcus agalactiae NOT DETECTED NOT DETECTED Final   Streptococcus pneumoniae NOT DETECTED NOT DETECTED Final   Streptococcus pyogenes DETECTED (A) NOT DETECTED Final    Comment: CRITICAL RESULT CALLED TO, READ BACK BY AND VERIFIED WITH: JASON ROBBINS PHARMD '@0549'$  11/23/22 ASW    A.calcoaceticus-baumannii NOT DETECTED NOT DETECTED Final   Bacteroides fragilis NOT DETECTED NOT DETECTED Final   Enterobacterales NOT DETECTED NOT DETECTED Final   Enterobacter cloacae complex NOT DETECTED NOT DETECTED Final   Escherichia coli NOT DETECTED NOT DETECTED Final   Klebsiella aerogenes NOT DETECTED NOT DETECTED Final   Klebsiella oxytoca NOT DETECTED NOT DETECTED Final   Klebsiella pneumoniae NOT DETECTED NOT DETECTED Final   Proteus species NOT DETECTED NOT DETECTED Final   Salmonella species NOT DETECTED NOT DETECTED Final   Serratia marcescens NOT DETECTED NOT DETECTED Final   Haemophilus influenzae NOT DETECTED NOT DETECTED Final   Neisseria meningitidis NOT DETECTED NOT DETECTED Final   Pseudomonas aeruginosa NOT DETECTED NOT DETECTED Final   Stenotrophomonas maltophilia NOT DETECTED NOT DETECTED Final   Candida albicans NOT DETECTED NOT DETECTED Final   Candida auris NOT DETECTED NOT DETECTED Final   Candida glabrata NOT DETECTED NOT DETECTED Final   Candida krusei NOT DETECTED NOT DETECTED Final   Candida parapsilosis NOT DETECTED NOT DETECTED Final   Candida tropicalis NOT DETECTED NOT DETECTED Final   Cryptococcus neoformans/gattii NOT DETECTED NOT DETECTED Final    Comment: Performed at [facility], [address]  [place], [place] [address]  Aerobic Culture w Gram Stain (superficial specimen)     Status: None   Collection Time: 11/23/22  5:22 PM   Specimen: Foot; Wound  Result Value Ref Range Status   Specimen Description   Final    FOOT Performed at [facility], [address], [place], [place] [address]    Special Requests   Final    LEFT FOOT Performed at [facility], [place], [place] [address]    Gram Stain   Final    RARE SQUAMOUS EPITHELIAL CELLS PRESENT RARE GRAM POSITIVE COCCI IN CLUSTERS Performed at [facility], [place] [address], [place], [place] [address]    Culture   Final    FEW STAPHYLOCOCCUS AUREUS FEW KLEBSIELLA PNEUMONIAE    Report Status 11/26/2022 FINAL  Final   Organism ID, Bacteria STAPHYLOCOCCUS AUREUS  Final   Organism ID, Bacteria KLEBSIELLA PNEUMONIAE  Final      Susceptibility   Klebsiella pneumoniae - MIC*    AMPICILLIN >=32 RESISTANT Resistant     CEFEPIME <=0.12 SENSITIVE Sensitive     CEFTAZIDIME <=1 SENSITIVE Sensitive     CEFTRIAXONE <=0.25 SENSITIVE Sensitive     CIPROFLOXACIN <=0.25 SENSITIVE Sensitive     GENTAMICIN <=1 SENSITIVE Sensitive     IMIPENEM <=0.25 SENSITIVE Sensitive     TRIMETH/SULFA <=20 SENSITIVE Sensitive     AMPICILLIN/SULBACTAM 8 SENSITIVE Sensitive     PIP/TAZO <=4 SENSITIVE Sensitive     * FEW KLEBSIELLA PNEUMONIAE  Staphylococcus aureus - MIC*    CIPROFLOXACIN <=0.5 SENSITIVE Sensitive     ERYTHROMYCIN <=0.25 SENSITIVE Sensitive     GENTAMICIN <=0.5 SENSITIVE Sensitive     OXACILLIN 0.5 SENSITIVE Sensitive     TETRACYCLINE <=1 SENSITIVE Sensitive     VANCOMYCIN 1 SENSITIVE Sensitive     TRIMETH/SULFA <=10 SENSITIVE Sensitive     CLINDAMYCIN <=0.25 SENSITIVE Sensitive     RIFAMPIN <=0.5 SENSITIVE Sensitive     Inducible Clindamycin NEGATIVE Sensitive     * FEW STAPHYLOCOCCUS AUREUS  Culture, blood (Routine X 2) w Reflex to ID Panel     Status: None   Collection Time: 11/24/22  8:07 AM    Specimen: BLOOD  Result Value Ref Range Status   Specimen Description BLOOD LEFT ANTECUBITAL  Final   Special Requests   Final    BOTTLES DRAWN AEROBIC AND ANAEROBIC Blood Culture adequate volume   Culture   Final    NO GROWTH 5 DAYS Performed at [facility], [address] [place], [place] [address]    Report Status 11/29/2022 FINAL  Final  Culture, blood (Routine X 2) w Reflex to ID Panel     Status: None   Collection Time: 11/24/22  8:08 AM   Specimen: BLOOD LEFT HAND  Result Value Ref Range Status   Specimen Description BLOOD LEFT HAND  Final   Special Requests   Final    BOTTLES DRAWN AEROBIC ONLY Blood Culture adequate volume   Culture   Final    NO GROWTH 5 DAYS Performed at [facility] [place], [place] [address]    Report Status 11/29/2022 FINAL  Final    Labs: CBC: Recent Labs  Lab 11/28/22 0505 11/29/22 0334 11/30/22 0518 12/01/22 0309 12/02/22 0648  WBC 7.0 7.5 8.6 7.6 8.8  HGB 9.0* 9.1* 8.9* 9.0* 9.6*  HCT 27.6* 28.2* 27.7* 27.3* 30.6*  MCV 92.3 91.6 93.3 93.8 96.5  PLT 218 201 178 164 [id number]   Basic Metabolic Panel: Recent Labs  Lab 11/27/22 0621 11/28/22 0505 11/29/22 0334 11/30/22 0518 12/01/22 0309 12/02/22 0648  NA 128* 133* 134* 134* 136 135  K 3.6 3.6 4.2 4.6 4.2 4.2  CL 99 108 106 108 110 109  CO2 21* 19* 20* 20* 20* 20*  GLUCOSE 159* 67* 66* 77 85 94  BUN 23 22 25* 28* 29* 31*  CREATININE 1.24* 1.02* 1.19* 1.23* 1.22* 1.24*  CALCIUM 8.0* 7.5* 8.4* 8.4* 8.3* 8.8*  MG 1.5* 2.0 1.9 1.9  --  1.7  PHOS 2.6 2.6 3.0 3.1  --  3.0   Liver Function Tests: No results for input(s): "AST", "ALT", "ALKPHOS", "BILITOT", "PROT", "ALBUMIN" in the last 168 hours. CBG: Recent Labs  Lab 12/01/22 2353 12/02/22 0434 12/02/22 0505 12/02/22 0536 12/02/22 0748  GLUCAP 74 69* 60* 109* 75    Discharge time spent: greater than 30 minutes.  Signed: Lucienne Minks , MD [facility] 12/02/2022

## 2022-12-02 NOTE — TOC Progression Note (Signed)
Transition of Care [facility]) - Progression Note    Patient Details  Name: Stacy [name] MRN: [id number] Date of Birth: [birth year]  Transition of [facility] CM/SW [place], RN Phone Number: 12/02/2022, 3:33 PM  Clinical Narrative:   Wound care nurse consulted.  Consult note sent to [facility].  I have informed Seth Bake with [facility].  EMS called and placed patient back on list for EMS transport. I have informed staff nurse of the above information.    Expected Discharge Plan: [place] Barriers to Discharge: Continued Medical Work up  Expected Discharge Plan and Services       Living arrangements for the past 2 months: Single Family Home Expected Discharge Date: 12/02/22                                     Social Determinants of Health (SDOH) Interventions SDOH Screenings   Food Insecurity: No Food Insecurity (11/22/2022)  Housing: Low Risk  (11/22/2022)  Transportation Needs: No Transportation Needs (11/22/2022)  Utilities: Not At Risk (11/22/2022)  Alcohol Screen: Low Risk  (02/04/2019)  Depression (PHQ2-9): Low Risk  (08/30/2022)  Financial Resource Strain: Low Risk  (04/07/2021)  Tobacco Use: Low Risk  (11/28/2022)    Readmission Risk Interventions    11/28/2022   10:20 AM  Readmission Risk Prevention Plan  Transportation Screening Complete  PCP or Specialist Appt within 3-5 Days Complete  Social Work Consult for [place] Planning/Counseling [place] Not Applicable  Medication Review Press photographer) Referral to Pharmacy

## 2022-12-02 NOTE — NC FL2 (Signed)
[place] LEVEL OF CARE FORM     IDENTIFICATION  Patient Name: Stacy [name] Birthdate: 04-Aug-1927 Sex: female Admission Date (Current Location): 11/22/2022  [facility] and [place] Number:  Engineering geologist and Address:  [facility], [address], [place], [place] [address]      Provider Number: [id number]  Attending Physician Name and Address:  Lucienne Minks, MD  Relative Name and Phone Number:  Ginger dauighter [phone]    Current Level of Care: Hospital Recommended Level of Care: Nursing Facility Prior Approval Number:    Date Approved/Denied:   PASRR Number:    Discharge Plan: Other (Comment) (LTC nursing facility)    Current Diagnoses: Patient Active Problem List   Diagnosis Date Noted   PAD (peripheral artery disease) ([place]) 11/28/2022   Bacteremia due to Streptococcus 11/24/2022   Wound infection 11/24/2022   Nonrheumatic aortic valve stenosis 09/27/2022   Cellulitis of left lower extremity 09/10/2022   Hypertension 09/09/2022   Hypothyroidism 09/09/2022   UTI (urinary tract infection) 07/05/2022   Persistent atrial fibrillation ([place]) 07/03/2022   Acute on chronic HFrEF (heart failure with reduced ejection fraction) ([place]) 07/03/2022   Dizziness 07/03/2022   Myocardial injury 07/03/2022   Stage 3a chronic kidney disease (CKD) ([place]) 07/03/2022   Left hip pain 07/03/2022   AAA (abdominal aortic aneurysm) without rupture ([place]) 06/15/2022   Other specified anemias 03/17/2021   Malnutrition of moderate degree 03/13/2021   Hypomagnesemia 03/08/2021   AKI (acute kidney injury) ([place]) 03/08/2021   Chronic respiratory failure with hypoxia ([place]) 03/07/2021   Acute blood loss anemia 03/07/2021   Leukocytosis 03/07/2021   Encounter for general adult medical examination with abnormal findings 07/05/2018   Neoplasm of uncertain behavior of skin of hand 07/05/2018   Simple chronic bronchitis ([place]) 12/31/2017   Congestive heart  failure ([place]) 10/20/2017   Primary generalized (osteo)arthritis 09/21/2017   Vitamin B12 deficiency anemia, unspecified 09/21/2017   Chronic kidney disease, unspecified 09/21/2017   Vitamin D deficiency, unspecified 09/21/2017   COPD (chronic obstructive pulmonary disease) ([place]) 09/21/2017   Unspecified hearing loss, bilateral 09/21/2017   Age-related osteoporosis without current pathological fracture 09/21/2017   Other amnesia 09/21/2017   Cough 09/21/2017   Allergic rhinitis, unspecified 09/21/2017   Other seborrheic dermatitis 09/21/2017   Unspecified cervical disc disorder, mid-cervical region, unspecified level 09/21/2017   Neuromuscular dysfunction of bladder, unspecified 09/21/2017   Shortness of breath 09/21/2017   Benign and innocent cardiac murmurs 09/21/2017   Dysuria 09/21/2017   Retention of urine, unspecified 09/21/2017   Unspecified urinary incontinence 09/21/2017   Mixed incontinence 09/21/2017   Insomnia, unspecified 09/21/2017   Presyncope secondary to symptomatic anemia 09/21/2017   Low back pain 09/21/2017   Hypoxemia 09/21/2017   Spondylosis without myelopathy or radiculopathy, lumbosacral region 09/21/2017   Pain in unspecified knee 09/21/2017   HLD (hyperlipidemia) 09/21/2017   Cardiomegaly 09/21/2017   Abnormality of gait and mobility 09/21/2017   Osteoarthritis 08/11/2014   Polymyalgia rheumatica ([place]) 08/11/2014   Personal history of malignant neoplasm of breast 04/15/2013   History of malignant neoplasm of breast 12/24/2012   Incisional hernia, without obstruction or gangrene 12/24/2012   Vitiligo     Orientation RESPIRATION BLADDER Height & Weight     Self, Place  Normal External catheter, Incontinent Weight: 84.5 kg Height:  '5\' 4"'$  (162.6 cm)  BEHAVIORAL SYMPTOMS/MOOD NEUROLOGICAL BOWEL NUTRITION STATUS      Incontinent Diet (carb modified)  AMBULATORY STATUS COMMUNICATION OF NEEDS Skin  Extensive Assist Verbally Other (Comment) (necrotic  ulcers on toes and on the heel and shin)                       Personal Care Assistance Level of Assistance  Bathing, Feeding, Dressing Bathing Assistance: Maximum assistance Feeding assistance: Limited assistance Dressing Assistance: Maximum assistance     Functional Limitations Info  Sight, Hearing, Speech Sight Info: Adequate Hearing Info: Impaired Speech Info: Adequate    SPECIAL CARE FACTORS FREQUENCY                       Contractures Contractures Info: Not present    Additional Factors Info  Code Status, Allergies Code Status Info: DNR Allergies Info: Aspirin, Augmentin (Amoxicillin-pot Clavulanate), Codeine, Doxycycline Hyclate, Sulfamethoxazole-trimethoprim, Tape, Vitamin D Analogs, Azithromycin           Current Medications (12/02/2022):  This is the current hospital active medication list Current Facility-Administered Medications  Medication Dose Route Frequency Provider Last Rate Last Admin   acetaminophen (TYLENOL) tablet 650 mg  650 mg Oral Q6H PRN Algernon Huxley, MD   650 mg at 11/24/22 1305   Or   acetaminophen (TYLENOL) suppository 650 mg  650 mg Rectal Q6H PRN Algernon Huxley, MD       acetaminophen (TYLENOL) tablet 500 mg  500 mg Oral TID Algernon Huxley, MD   500 mg at 12/02/22 0909   albuterol (PROVENTIL) (2.5 MG/3ML) 0.083% nebulizer solution 2.5 mg  2.5 mg Nebulization Q2H PRN Algernon Huxley, MD       amiodarone (PACERONE) tablet 100 mg  100 mg Oral Daily Algernon Huxley, MD   100 mg at 12/02/22 0908   apixaban (ELIQUIS) tablet 2.5 mg  2.5 mg Oral BID Val Riles, MD   2.5 mg at 12/02/22 [id number]   ascorbic acid (VITAMIN C) tablet 500 mg  500 mg Oral BID Algernon Huxley, MD   500 mg at 12/02/22 [id number]   bacitracin-polymyxin b (POLYSPORIN) ophthalmic ointment   Both Eyes BID Algernon Huxley, MD   Given at 12/02/22 0910   bisacodyl (DULCOLAX) EC tablet 10 mg  10 mg Oral QHS Algernon Huxley, MD   10 mg at 12/01/22 2206   calcium carbonate (OS-CAL - dosed in mg of  elemental calcium) tablet 1,250 mg  600 mg of elemental calcium Oral BID WC Algernon Huxley, MD   1,250 mg at 12/02/22 0909   ceFAZolin (ANCEF) IVPB 2g/100 mL premix  2 g Intravenous Q12H Alison Murray, RPH 200 mL/hr at 12/02/22 0333 2 g at 12/02/22 0333   clopidogrel (PLAVIX) tablet 75 mg  75 mg Oral Daily Algernon Huxley, MD   75 mg at 12/02/22 0909   fentaNYL (SUBLIMAZE) injection 12.5 mcg  12.5 mcg Intravenous Once PRN Algernon Huxley, MD       ferrous sulfate tablet 325 mg  325 mg Oral Richardean Chimera, MD   325 mg at 12/01/22 0909   gabapentin (NEURONTIN) capsule 200 mg  200 mg Oral TID Algernon Huxley, MD   200 mg at 12/02/22 0909   HYDROcodone-acetaminophen (NORCO/VICODIN) 5-325 MG per tablet 1 tablet  1 tablet Oral Q4H PRN Algernon Huxley, MD   1 tablet at 11/28/22 0758   hydrocortisone (CORTEF) tablet 10 mg  10 mg Oral Daily Val Riles, MD   10 mg at 12/02/22 [id number]   And   hydrocortisone (CORTEF) tablet  5 mg  5 mg Oral QPM Val Riles, MD   5 mg at 12/01/22 1731   hydrocortisone cream 1 %   Topical BID Val Riles, MD   Given at 12/02/22 0910   HYDROmorphone (DILAUDID) injection 1 mg  1 mg Intravenous Once PRN Algernon Huxley, MD       levothyroxine (SYNTHROID) tablet 50 mcg  50 mcg Oral QAC breakfast Algernon Huxley, MD   50 mcg at 12/02/22 [id number]   magic mouthwash w/lidocaine  5 mL Oral TID PRN Val Riles, MD       metoCLOPramide (REGLAN) tablet 5 mg  5 mg Oral TID Luz Brazen, MD   5 mg at 12/02/22 1219   midodrine (PROAMATINE) tablet 10 mg  10 mg Oral TID WC Val Riles, MD   10 mg at 12/02/22 1219   multivitamin with minerals tablet 1 tablet  1 tablet Oral Daily Algernon Huxley, MD   1 tablet at 12/02/22 [id number]   nystatin (MYCOSTATIN) 100000 UNIT/ML suspension 500,000 Units  5 mL Oral QID Val Riles, MD   500,000 Units at 12/02/22 0909   ondansetron [facility]) injection 4 mg  4 mg Intravenous Q6H PRN Algernon Huxley, MD   4 mg at 11/29/22 0937   ondansetron (ZOFRAN) tablet 8 mg  8 mg Oral  Q8H PRN Algernon Huxley, MD   8 mg at 11/28/22 2127   pantoprazole (PROTONIX) EC tablet 40 mg  40 mg Oral BID Algernon Huxley, MD   40 mg at 12/02/22 0909   polyethylene glycol (MIRALAX / GLYCOLAX) packet 17 g  17 g Oral Daily Algernon Huxley, MD   17 g at 12/02/22 [id number]   polyvinyl alcohol (LIQUIFILM TEARS) 1.4 % ophthalmic solution 1 drop  1 drop Both Eyes PRN Algernon Huxley, MD   1 drop at 11/27/22 0546   povidone-iodine (BETADINE) 10 % external solution   Topical Daily Tsosie Billing, MD   Given at 12/02/22 [id number]   zinc sulfate capsule 220 mg  220 mg Oral Daily Algernon Huxley, MD   220 mg at 12/02/22 0909     Discharge Medications: Please see discharge summary for a list of discharge medications.  Relevant Imaging Results:  Relevant Lab Results:   Additional Information SSN: [id number]  Conception Oms, RN

## 2022-12-02 NOTE — Progress Notes (Signed)
Hypoglycemic Event  CBG: 60   Treatment: D50 25 mL (12.5 gm)  Symptoms: None  Follow-up CBG: Time:0536  CBG Result:109  Possible Reasons for Event: Inadequate meal intake  Comments/MD notified: Pt is unable to drink Orange juice because it makes her sick. I offered other juices and she refused.     [name] Stacy [name]

## 2022-12-02 NOTE — TOC Progression Note (Signed)
Transition of Care [facility]) - Progression Note    Patient Details  Name: TRANIECE BERND MRN: [id number] Date of Birth: 03/15/1927  Transition of Care [facility]) CM/SW [place], RN Phone Number: 12/02/2022, 2:32 PM  Clinical Narrative:    Reached out to [facility] and left a secure VM requesting that the patient be seen and Wound Care orders provided so that we can send them to [facility] facility We contacted EMS and requested the Transport to be put on hold until wound care orders are obtained, will call EMS to arrange transport once Wound care orders obtained   Expected Discharge Plan: [place] Barriers to Discharge: Continued Medical Work up  Expected Discharge Plan and [place] arrangements for the past 2 months: Single Family Home Expected Discharge Date: 12/02/22                                     Social Determinants of Health (SDOH) Interventions SDOH Screenings   Food Insecurity: No Food Insecurity (11/22/2022)  Housing: Low Risk  (11/22/2022)  Transportation Needs: No Transportation Needs (11/22/2022)  Utilities: Not At Risk (11/22/2022)  Alcohol Screen: Low Risk  (02/04/2019)  Depression (PHQ2-9): Low Risk  (08/30/2022)  Financial Resource Strain: Low Risk  (04/07/2021)  Tobacco Use: Low Risk  (11/28/2022)    Readmission Risk Interventions    11/28/2022   10:20 AM  Readmission Risk Prevention Plan  Transportation Screening Complete  PCP or Specialist Appt within 3-5 Days Complete  Social Work Consult for [place] Planning/Counseling [place] Not Applicable  Medication Review Press photographer) Referral to Pharmacy

## 2022-12-02 NOTE — Consult Note (Signed)
[place] Nurse Consult Note: Reason for Consult:To determine if wound care would be needed at facility to heels Wound type: Stage 1 to left medial heel, right heel intact Pressure Injury POA: N/A  No wound care will be required other than to keep patient's heels off of the bed surface.  Bilateral Prevalon boots are provided.   [place] nursing team will not follow, but will remain available to this patient, the nursing and medical teams.  Please re-consult if needed.  Thank you for inviting [place] to participate in this patient's Plan of Care.  Maudie Flakes, MSN, RN, CNS, [place], Serita Grammes, [employer], [employer] phone:  [phone]

## 2022-12-07 ENCOUNTER — Encounter: Payer: Self-pay | Admitting: Student

## 2022-12-07 ENCOUNTER — Non-Acute Institutional Stay (SKILLED_NURSING_FACILITY): Payer: Medicare Other | Admitting: Student

## 2022-12-07 DIAGNOSIS — I4819 Other persistent atrial fibrillation: Secondary | ICD-10-CM

## 2022-12-07 DIAGNOSIS — R7881 Bacteremia: Secondary | ICD-10-CM

## 2022-12-07 DIAGNOSIS — B955 Unspecified streptococcus as the cause of diseases classified elsewhere: Secondary | ICD-10-CM

## 2022-12-07 DIAGNOSIS — I5023 Acute on chronic systolic (congestive) heart failure: Secondary | ICD-10-CM | POA: Diagnosis not present

## 2022-12-07 DIAGNOSIS — I739 Peripheral vascular disease, unspecified: Secondary | ICD-10-CM | POA: Diagnosis not present

## 2022-12-07 DIAGNOSIS — E039 Hypothyroidism, unspecified: Secondary | ICD-10-CM | POA: Diagnosis not present

## 2022-12-07 DIAGNOSIS — J9611 Chronic respiratory failure with hypoxia: Secondary | ICD-10-CM | POA: Diagnosis not present

## 2022-12-07 DIAGNOSIS — N1831 Chronic kidney disease, stage 3a: Secondary | ICD-10-CM | POA: Diagnosis not present

## 2022-12-07 DIAGNOSIS — J449 Chronic obstructive pulmonary disease, unspecified: Secondary | ICD-10-CM

## 2022-12-07 DIAGNOSIS — L03116 Cellulitis of left lower limb: Secondary | ICD-10-CM | POA: Diagnosis not present

## 2022-12-07 DIAGNOSIS — N179 Acute kidney failure, unspecified: Secondary | ICD-10-CM | POA: Diagnosis not present

## 2022-12-07 NOTE — Progress Notes (Signed)
Provider:  Dr. Dewayne Shorter Location:  Other [place].  Nursing Home Room Number: [place] of Service:  SNF (31)  PCP: Dewayne Shorter, MD Patient Care Team: Dewayne Shorter, MD as PCP - General (Family Medicine) Christene Lye, MD as Consulting Physician (General Surgery) Alena Bills, [facility] as Pharmacist (Pharmacist)  Extended Emergency Contact Information Primary Emergency Contact: Stacy [name] Address: [address]          [place], [place] [address] [name] Phone: [phone] Relation: Daughter Secondary Emergency Contact: Kemmerer,randy Mobile Phone: [phone] Relation: Son  Code Status: DNR Goals of Care: Advanced Directive information    12/07/2022    9:02 AM  Advanced Directives  Does Patient Have a Medical Advance Directive? Yes  Type of Paramedic of [place];Out of facility DNR (pink MOST or yellow form)  Does patient want to make changes to medical advance directive? No - Patient declined  Copy of [place] in Chart? Yes - validated most recent copy scanned in chart (See row information)    Chief Complaint  Patient presents with   New Admit To SNF    New Admission.     HPI: Patient is a 89 y.o. female seen today for admission to [facility] after hospitalization. Past medical history significant of hypertension, fibromyalgia, atrial fibrillation on anticoagulation with Eliquis,AAA history of breast cancer who presented with new extremity ulcers and associated pain. Imaging without boney involvement. S/p iliac and left lower extremity angioplasty left popliteal and trunk on 11/28/22  Spoke to her daughter regarding concern that she has had significant decline. They are aware that her  Mother has declined significantly.   She doesn't want to do things if she isn't going to get better. Discussed concern that return to the hospital may be   She recognizes everyone,  but has more difficulty talking.   They are inclined to help make her  She understands that her dad went down hill pretty quickly, and she is concerned that the same may happen for her mom. She is concerned that she wouldn't make it through another surgery, and they know it is nearing the end of life for her. 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   EYE SURGERY  2002   FOOT SURGERY  1996   HERNIA REPAIR  2013   left breast biopsy   2011   LOWER EXTREMITY ANGIOGRAPHY Left 11/28/2022   Procedure: Lower Extremity Angiography;  Surgeon: Algernon Huxley, MD;  Location: [place] [facility];  Service: Cardiovascular;  Laterality: Left;    reports that she has never smoked. She has never used smokeless tobacco. She reports that she does not drink alcohol and does not use drugs. Social History   Socioeconomic History  Marital status: Widowed    Spouse name: Not on file   Number of children: Not on file   Years of education: Not on file   Highest education level: Not on file  Occupational History   Not on file  Tobacco Use   Smoking status: Never   Smokeless tobacco: Never  Vaping Use   Vaping Use: Never used  Substance and Sexual Activity   Alcohol use: No   Drug use: No   Sexual activity: Not on file  Other Topics Concern   Not on file  Social History Narrative   Not on file   Social Determinants of Health   Financial Resource  Strain: Low Risk  (04/07/2021)   Overall Financial Resource Strain (CARDIA)    Difficulty of Paying Living Expenses: Not very hard  Food Insecurity: No Food Insecurity (11/22/2022)   Hunger Vital Sign    Worried About Running Out of Food in the Last Year: Never true    Ran Out of Food in the Last Year: Never true  Transportation Needs: No Transportation Needs (11/22/2022)   PRAPARE - Hydrologist (Medical): No    Lack of Transportation (Non-Medical): No  Physical Activity: Not on file  Stress: Not on file  Social Connections: Not on file  Intimate Partner Violence: Not At Risk (11/22/2022)   Humiliation, Afraid, Rape, and Kick questionnaire    Fear of Current or Ex-Partner: No    Emotionally Abused: No    Physically Abused: No    Sexually Abused: No    Functional Status Survey:    Family History  Problem Relation Age of Onset   Heart disease Mother    Emphysema Mother    Arthritis Father    Cancer Father    Breast cancer Neg Hx     Health Maintenance  Topic Date Due   DTaP/Tdap/Td (1 - Tdap) Never done   Zoster Vaccines- Shingrix (1 of 2) Never done   Pneumonia Vaccine 26+ Years old (1 of 1 - PCV) Never done   FOOT EXAM  05/10/2022   OPHTHALMOLOGY EXAM  05/10/2022   COVID-19 Vaccine (4 - 2023-24 season) 05/27/2022   Medicare Annual Wellness (AWV)  01/21/2023   HEMOGLOBIN A1C  01/02/2023   INFLUENZA VACCINE  Completed   DEXA SCAN  Completed   HPV VACCINES  Aged Out    Allergies  Allergen Reactions   Aspirin Itching and Other (See Comments)    Dizzy/passing out   Augmentin [Amoxicillin-Pot Clavulanate]    Codeine Other (See Comments)    dizziness   Doxycycline Hyclate Other (See Comments)   Sulfamethoxazole-Trimethoprim Diarrhea   Tape Itching   Vitamin D Analogs    Azithromycin Rash and Hives    Outpatient Encounter Medications as of 12/07/2022  Medication Sig   acetaminophen (TYLENOL) 500 MG tablet Take 500 mg by mouth 3 (three)  times daily. Take one tablet by mouth every 8 hours as needed.   amiodarone (PACERONE) 100 MG tablet Take 100 mg by mouth daily.   apixaban (ELIQUIS) 2.5 MG TABS tablet Take 1 tablet (2.5 mg total) by mouth 2 (two) times daily.   calcium carbonate (OSCAL) 1500 (600 Ca) MG TABS tablet Take 600 mg of elemental calcium by mouth 2 (two) times daily with a meal.   cephALEXin (KEFLEX) 500 MG capsule Take 1 capsule (500 mg total) by mouth 3 (three) times daily for 6 days.   ciprofloxacin (CILOXAN) 0.3 % ophthalmic solution Place 1  drop into both eyes every 4 (four) hours while awake.   clopidogrel (PLAVIX) 75 MG tablet Take 1 tablet (75 mg total) by mouth daily.   Dextromethorphan-guaiFENesin 20-400 MG TABS Take 1 tablet by mouth 2 (two) times daily.   diclofenac Sodium (VOLTAREN) 1 % GEL Apply 4 g topically 4 (four) times daily.   ferrous sulfate 325 (65 FE) MG EC tablet Take 325 mg by mouth every other day.   gabapentin (NEURONTIN) 100 MG capsule Take 2 tabs 3 x day for neuropathy by mouth   hydroxychloroquine (PLAQUENIL) 200 MG tablet Take 200 mg by mouth daily.   Infant Care Products [facility]) OINT Apply to buttock wound topically every day and evening shift for skin protection   lactose free nutrition (BOOST) LIQD Take 237 mLs by mouth 3 (three) times daily between meals.   levothyroxine (SYNTHROID) 50 MCG tablet Take 50 mcg by mouth daily before breakfast.   metoprolol tartrate (LOPRESSOR) 25 MG tablet Take 0.5 tablets (12.5 mg total) by mouth 2 (two) times daily.   midodrine (PROAMATINE) 10 MG tablet Take 1 tablet (10 mg total) by mouth 3 (three) times daily with meals.   mirtazapine (REMERON) 15 MG tablet Take 15 mg by mouth at bedtime.   Multiple Vitamins-Minerals (DECUBI-VITE PO) Take 1 tablet by mouth daily. For wound healing   ondansetron (ZOFRAN) 8 MG tablet Take 8 mg by mouth every 8 (eight) hours as needed for nausea or vomiting.   OXYGEN 2lpm continuously for dyspnea.   pantoprazole  (PROTONIX) 40 MG tablet TAKE 1 TABLET TWICE DAILY   sucralfate (CARAFATE) 1 GM/10ML suspension Take 1 g by mouth 4 (four) times daily -  with meals and at bedtime.   No facility-administered encounter medications on file as of 12/07/2022.    Review of Systems  Vitals:   12/07/22 0851  BP: 97/60  Pulse: 90  Resp: 16  Temp: 97.8 F (36.6 C)  SpO2: 95%  Weight: 178 lb (80.7 kg)  Height: '5\' 4"'$  (1.626 m)   Body mass index is 30.55 kg/m. Physical Exam Constitutional:      Comments: Chronically ill appearing, An[place]arc[place]  Cardiovascular:     Pulses: Normal pulses.  Pulmonary:     Comments: 3LNC, low breath sounds bilaterally Abdominal:     General: Bowel sounds are normal.     Palpations: Abdomen is soft.  Neurological:     General: No focal deficit present.     Mental Status: She is alert. She is disoriented.     Comments: Oriented to self, and family, however, unable to give location or time     Labs reviewed: Basic Metabolic Panel: Recent Labs    11/29/22 0334 11/30/22 0518 12/01/22 0309 12/02/22 0648  NA 134* 134* 136 135  K 4.2 4.6 4.2 4.2  CL 106 108 110 109  CO2 20* 20* 20* 20*  GLUCOSE 66* 77 85 94  BUN 25* 28* 29* 31*  CREATININE 1.19* 1.23* 1.22* 1.24*  CALCIUM 8.4* 8.4* 8.3* 8.8*  MG 1.9 1.9  --  1.7  PHOS 3.0 3.1  --  3.0   Liver Function Tests: Recent Labs    09/05/22 1809 11/14/22 0000 11/22/22 1612  AST 16 12* 19  ALT 9 4* 7  ALKPHOS 59 80 97  BILITOT 0.4  --  0.5  PROT 6.7  --  6.4*  ALBUMIN 3.4* 2.2* 2.3*   No results for input(s): "LIPASE", "AMYLASE" in the last 8760 hours. No results for input(s): "AMMONIA"  in the last 8760 hours. CBC: Recent Labs    10/13/22 0000 10/24/22 0000 11/14/22 0000 11/22/22 1612 11/30/22 0518 12/01/22 0309 12/02/22 0648  WBC 4.9 5.9 5.5   < > 8.6 7.6 8.8  NEUTROABS 2,372.00 2,207.00 2,403.00  --   --   --   --   HGB 9.0* 9.9* 10.0*   < > 8.9* 9.0* 9.6*  HCT 29* 31* 31*   < > 27.7* 27.3* 30.6*  MCV   --   --   --    < > 93.3 93.8 96.5  PLT 230 208  --    < > 178 164 165   < > = values in this interval not displayed.   Cardiac Enzymes: No results for input(s): "CKTOTAL", "CKMB", "CKMBINDEX", "TROPONINI" in the last 8760 hours. BNP: Invalid input(s): "POCBNP" Lab Results  Component Value Date   HGBA1C 5.4 07/03/2022   Lab Results  Component Value Date   TSH 10.939 (H) 11/23/2022   Lab Results  Component Value Date   VITAMINB12 669 07/04/2022   Lab Results  Component Value Date   FOLATE >40.0 07/03/2022   Lab Results  Component Value Date   IRON 26 (L) 07/03/2022   TIBC 300 07/03/2022   FERRITIN 16 07/03/2022    Imaging and Procedures obtained prior to SNF admission: ECHOCARDIOGRAM COMPLETE  Result Date: 11/24/2022    ECHOCARDIOGRAM REPORT   Patient Name:   Stacy [name] Date of Exam: 11/23/2022 Medical Rec #:  [id number]   Height:       64.0 in Accession #:    [id number]  Weight:       159.4 lb Date of Birth:  1927/04/25    BSA:          1.776 m Patient Age:    76 years    BP:           99/59 mmHg Patient Gender: F           HR:           101 bpm. Exam Location:  [facility] Procedure: 2D Echo, Cardiac Doppler and Color Doppler Indications:     R78.81 Bacteremia  History:         Patient has prior history of Echocardiogram examinations, most                  recent 07/04/2022. Risk Factors:Hypertension.  Sonographer:     Cresenciano Lick RDCS Referring Phys:  [id number] Val Riles Diagnosing Phys: Ida Rogue MD IMPRESSIONS  1. Left ventricular ejection fraction, by estimation, is 30 to 35%. The left ventricle has moderately decreased function. The left ventricle demonstrates global hypokinesis with severe hypokinesis of the anterior and anteroseptal wall. There is moderate  left ventricular hypertrophy. Left ventricular diastolic parameters are indeterminate.  2. Right ventricular systolic function is normal. The right ventricular size is normal. There is normal pulmonary artery  systolic pressure.  3. Left atrial size was mildly dilated.  4. The mitral valve is normal in structure. Mild mitral valve regurgitation. No evidence of mitral stenosis. Moderate mitral annular calcification.  5. Tricuspid valve regurgitation is severe.  6. The aortic valve is normal in structure. There is severe calcifcation of the aortic valve. Aortic valve regurgitation is not visualized. Moderate to severe aortic valve stenosis, though degree of stenosis may be underestimated in setting of low EF. Aortic valve area, by VTI measures 0.42 cm. Aortic valve mean gradient measures 18.6 mmHg. Aortic valve Vmax measures 2.76  m/s.  7. There is borderline dilatation of the aortic root, measuring 37 mm.  8. The inferior vena cava is normal in size with greater than 50% respiratory variability, suggesting right atrial pressure of 3 mmHg. FINDINGS  Left Ventricle: Left ventricular ejection fraction, by estimation, is 30 to 35%. The left ventricle has moderately decreased function. The left ventricle demonstrates global hypokinesis. The left ventricular internal cavity size was normal in size. There is moderate left ventricular hypertrophy. Left ventricular diastolic parameters are indeterminate. Right Ventricle: The right ventricular size is normal. No increase in right ventricular wall thickness. Right ventricular systolic function is normal. There is normal pulmonary artery systolic pressure. The tricuspid regurgitant velocity is 2.41 m/s, and  with an assumed right atrial pressure of 5 mmHg, the estimated right ventricular systolic pressure is [id number] mmHg. Left Atrium: Left atrial size was mildly dilated. Right Atrium: Right atrial size was normal in size. Pericardium: There is no evidence of pericardial effusion. Mitral Valve: The mitral valve is normal in structure. Moderate mitral annular calcification. Mild mitral valve regurgitation. No evidence of mitral valve stenosis. Tricuspid Valve: The tricuspid valve is normal  in structure. Tricuspid valve regurgitation is severe. No evidence of tricuspid stenosis. Aortic Valve: The aortic valve is normal in structure. There is severe calcifcation of the aortic valve. Aortic valve regurgitation is not visualized. Moderate to severe aortic stenosis is present. Aortic valve mean gradient measures 18.6 mmHg. Aortic valve peak gradient measures 30.4 mmHg. Aortic valve area, by VTI measures 0.42 cm. Pulmonic Valve: The pulmonic valve was normal in structure. Pulmonic valve regurgitation is mild. No evidence of pulmonic stenosis. Aorta: The aortic root is normal in size and structure. There is borderline dilatation of the aortic root, measuring 37 mm. Venous: The inferior vena cava is normal in size with greater than 50% respiratory variability, suggesting right atrial pressure of 3 mmHg. IAS/Shunts: No atrial level shunt detected by color flow Doppler.  LEFT VENTRICLE PLAX 2D LVIDd:         4.10 cm LVIDs:         3.50 cm LV PW:         1.20 cm LV IVS:        1.20 cm LVOT diam:     2.00 cm LV SV:         20 LV SV Index:   11 LVOT Area:     3.14 cm  RIGHT VENTRICLE            IVC RV Basal diam:  3.70 cm    IVC diam: 1.10 cm RV S prime:     8.77 cm/s LEFT ATRIUM             Index        RIGHT ATRIUM           Index LA diam:        4.20 cm 2.36 cm/m   RA Area:     26.60 cm LA Vol (A2C):   80.1 ml 45.09 ml/m  RA Volume:   101.00 ml 56.85 ml/m LA Vol (A4C):   62.4 ml 35.13 ml/m LA Biplane Vol: 72.0 ml 40.53 ml/m  AORTIC VALVE AV Area (Vmax):    0.47 cm AV Area (Vmean):   0.43 cm AV Area (VTI):     0.42 cm AV Vmax:           275.80 cm/s AV Vmean:          202.600 cm/s AV  VTI:            0.487 m AV Peak Grad:      30.4 mmHg AV Mean Grad:      18.6 mmHg LVOT Vmax:         41.27 cm/s LVOT Vmean:        27.433 cm/s LVOT VTI:          0.064 m LVOT/AV VTI ratio: 0.13  AORTA Ao Root diam: 3.20 cm Ao Asc diam:  3.70 cm MV E velocity: 110.00 cm/s  TRICUSPID VALVE                             TR Peak  grad:   23.2 mmHg                             TR Vmax:        241.00 cm/s                              SHUNTS                             Systemic VTI:  0.06 m                             Systemic Diam: 2.00 cm Ida Rogue MD Electronically signed by Ida Rogue MD Signature Date/Time: 11/24/2022/7:34:34 AM    Final     Assessment/Plan 1. Acute on chronic HFrEF (heart failure with reduced ejection fraction) ([place]) Patient with CHF and up 22 lb since patient prior to 10-day hospitalization. Patient with swelling in bilateral  upper extremities and legs, now on supplemental oxygen 2-3 LNC. Discussed concern with children that patient has declined and she is unlikely to have great recovery if she is to become ill. Tenuous diuresis plan given low blood pressures. Will plan for Lasix 40 mg daily PO for the next 7 days, BP q8 hours. BMP tomorrow and repeat in 1 week. Continue midodrine 10 mg TID.   2. Persistent atrial fibrillation (HCC) HR well-controlled at this time. Continue to monitor with q8 hour vitals. Continue amiodarone 100 mg daily. Continue apixiban 2.5 mg BID. Continue plavix 75 mg daily. Continue metoprolol 12.5 mg BID.   3. Chronic obstructive pulmonary disease, unspecified COPD type ([place]) 4. Chronic respiratory failure with hypoxia (HCC) Currently on oxygen. Diuresis to aid with wean of oxygen.   5. Hypothyroidism, unspecified type Most recent TSH elevated, continue Levothyroxine 50 mcg daily, repeat in 6 weeks.  TSH  Date/Time Value Ref Range Status  11/23/2022 03:20 AM 10.939 (H) 0.350 - 4.500 uIU/mL Final    Comment:    Performed by a 3rd Generation assay with a functional sensitivity of <=0.01 uIU/mL. Performed at [facility], [address] [place], [place] [address]   ]  6. Stage 3a chronic kidney disease (CKD) (HCC) Continue keflex 500 mg TID. Continue  7. AKI (acute kidney injury) ([place]) BMP tomorrow and in 1 week. Avoid nephrotoxic medications as  possible.   8. PAD (peripheral artery disease) (HCC) No pain at this time, however, ulcers on bilateral feet and heel wounds likely due to poor circulation. Stent placed inpatient. Pain improved. Continue to monitor for progression. Continue betadine on feet ulcers.  Patient's daughter states they would not like additional procedures or surgeries in the future.   9. Cellulitis of left lower extremity 10. Bacteremia due to Streptococcus Continue Keflex to complete course.   Family/ staff Communication: Ginger (HCPOA), Sonia Baller, and other son. Nursing staff.   Labs/tests ordered: BMP today and in 1 week.    I spent 30 minutes in face to face time and an additional 15 minutes discussing goals of care. Discussed concern that patient would not tolerate additional physiologic decline and my recommendation is to defer future hospitalizations and comfort care. Family will plan to discuss further and communicate with care team when decision is made.

## 2022-12-08 DIAGNOSIS — I5022 Chronic systolic (congestive) heart failure: Secondary | ICD-10-CM | POA: Diagnosis not present

## 2022-12-08 DIAGNOSIS — L97522 Non-pressure chronic ulcer of other part of left foot with fat layer exposed: Secondary | ICD-10-CM | POA: Diagnosis not present

## 2022-12-08 DIAGNOSIS — D649 Anemia, unspecified: Secondary | ICD-10-CM | POA: Diagnosis not present

## 2022-12-08 DIAGNOSIS — I70245 Atherosclerosis of native arteries of left leg with ulceration of other part of foot: Secondary | ICD-10-CM | POA: Diagnosis not present

## 2022-12-15 DIAGNOSIS — I959 Hypotension, unspecified: Secondary | ICD-10-CM | POA: Diagnosis not present

## 2022-12-15 DIAGNOSIS — I70242 Atherosclerosis of native arteries of left leg with ulceration of calf: Secondary | ICD-10-CM | POA: Diagnosis not present

## 2022-12-15 DIAGNOSIS — D649 Anemia, unspecified: Secondary | ICD-10-CM | POA: Diagnosis not present

## 2022-12-15 DIAGNOSIS — I130 Hypertensive heart and chronic kidney disease with heart failure and stage 1 through stage 4 chronic kidney disease, or unspecified chronic kidney disease: Secondary | ICD-10-CM | POA: Diagnosis not present

## 2022-12-15 DIAGNOSIS — N1831 Chronic kidney disease, stage 3a: Secondary | ICD-10-CM | POA: Diagnosis not present

## 2022-12-15 DIAGNOSIS — I4891 Unspecified atrial fibrillation: Secondary | ICD-10-CM | POA: Diagnosis not present

## 2022-12-15 DIAGNOSIS — E785 Hyperlipidemia, unspecified: Secondary | ICD-10-CM | POA: Diagnosis not present

## 2022-12-15 DIAGNOSIS — I5022 Chronic systolic (congestive) heart failure: Secondary | ICD-10-CM | POA: Diagnosis not present

## 2022-12-15 DIAGNOSIS — D509 Iron deficiency anemia, unspecified: Secondary | ICD-10-CM | POA: Diagnosis not present

## 2022-12-15 DIAGNOSIS — L97222 Non-pressure chronic ulcer of left calf with fat layer exposed: Secondary | ICD-10-CM | POA: Diagnosis not present

## 2022-12-16 ENCOUNTER — Non-Acute Institutional Stay (SKILLED_NURSING_FACILITY): Payer: Medicare Other | Admitting: Student

## 2022-12-16 ENCOUNTER — Encounter: Payer: Self-pay | Admitting: Student

## 2022-12-16 ENCOUNTER — Other Ambulatory Visit: Payer: Self-pay | Admitting: Student

## 2022-12-16 DIAGNOSIS — E162 Hypoglycemia, unspecified: Secondary | ICD-10-CM

## 2022-12-16 DIAGNOSIS — I5023 Acute on chronic systolic (congestive) heart failure: Secondary | ICD-10-CM | POA: Diagnosis not present

## 2022-12-16 DIAGNOSIS — Z515 Encounter for palliative care: Secondary | ICD-10-CM | POA: Insufficient documentation

## 2022-12-16 MED ORDER — MORPHINE SULFATE (CONCENTRATE) 20 MG/ML PO SOLN: 5.0000 mg | ORAL | 0 refills | Status: DC | PRN

## 2022-12-16 MED ORDER — LORAZEPAM 1 MG PO TABS: 1.0000 mg | ORAL_TABLET | Freq: Three times a day (TID) | ORAL | 0 refills | Status: DC | PRN

## 2022-12-16 NOTE — Progress Notes (Signed)
Patient to transition to hospice level of care Adding comfort measure medications.

## 2022-12-16 NOTE — Progress Notes (Signed)
Location:  Other [place].  Nursing Home Room Number: [place] of Service:  SNF [id number]) Provider:  Dewayne Shorter, MD  Patient Care Team: Dewayne Shorter, MD as PCP - General (Family Medicine) Christene Lye, MD as Consulting Physician (General Surgery) Alena Bills, [facility] as Pharmacist (Pharmacist)  Extended Emergency Contact Information Primary Emergency Contact: [name],Ginger Address: [address]          [place], [place] [address] Johnnette Litter of [place] Phone: [phone] Relation: Daughter Secondary Emergency Contact: Yellowhair,randy Mobile Phone: [phone] Relation: Son  Code Status:  DNR Goals of care: Advanced Directive information    12/16/2022    2:08 PM  Advanced Directives  Does Patient Have a Medical Advance Directive? Yes  Type of Paramedic of [place];Out of facility DNR (pink MOST or yellow form);Living will  Does patient want to make changes to medical advance directive? No - Patient declined  Copy of [place] in Chart? Yes - validated most recent copy scanned in chart (See row information)     Chief Complaint  Patient presents with   Acute Visit    Goals of Care    HPI:  Pt is a 89 y.o. female seen today for an acute visit for evaluation after multiple hypoglycemic events.   Patient is alert sitting in recliner talking on the phone.   Called daughter to discuss concerns for patient's decline given multiple episodes of hypoglycemia. Wounds are not improving. Patient has been eating less and less and more confused.    Past Medical History:  Diagnosis Date   Acute colitis on CT 03/07/2021   Arthritis 1940   Asthma    Bowel trouble    Cancer ([place]) 2006   DCIS left breast   Hypertension 1940   Incisional hernia 2014   Other primary ovarian failure 09/21/2017   Personal history of radiation therapy    Polymyalgia ([place])    Thyroid disorder    Vitiligo 2012   Past  Surgical History:  Procedure Laterality Date   BREAST BIOPSY Left 2006   +   BREAST SURGERY Left 2006   lumpectomy   [place]  2004   COLONOSCOPY  2014   [facility]   EYE SURGERY  2002   FOOT SURGERY  1996   HERNIA REPAIR  2013   left breast biopsy   2011   LOWER EXTREMITY ANGIOGRAPHY Left 11/28/2022   Procedure: Lower Extremity Angiography;  Surgeon: Algernon Huxley, MD;  Location: [place] [facility];  Service: Cardiovascular;  Laterality: Left;    Allergies  Allergen Reactions   Aspirin Itching and Other (See Comments)    Dizzy/passing out   Augmentin [Amoxicillin-Pot Clavulanate]    Codeine Other (See Comments)    dizziness   Doxycycline Hyclate Other (See Comments)   Sulfamethoxazole-Trimethoprim Diarrhea   Tape Itching   Vitamin D Analogs    Azithromycin Rash and Hives    Outpatient Encounter Medications as of 12/16/2022  Medication Sig   acetaminophen (TYLENOL) 500 MG tablet Take 500 mg by mouth 3 (three) times daily. Take one tablet by mouth every 8 hours as needed.   amiodarone (PACERONE) 100 MG tablet Take 100 mg by mouth daily.   apixaban (ELIQUIS) 2.5 MG TABS tablet Take 1 tablet (2.5 mg total) by mouth 2 (two) times daily.   calcium carbonate (OSCAL) 1500 (600 Ca) MG TABS tablet Take 600 mg of elemental calcium by  mouth 2 (two) times daily with a meal.   clopidogrel (PLAVIX) 75 MG tablet Take 1 tablet (75 mg total) by mouth daily.   clotrimazole-betamethasone (LOTRISONE) cream Apply to entire buttocks topically daily and evening   Dextromethorphan-guaiFENesin 20-400 MG TABS Take 1 tablet by mouth 2 (two) times daily.   diclofenac Sodium (VOLTAREN) 1 % GEL Apply 4 g topically 4 (four) times daily.   ferrous sulfate 325 (65 FE) MG EC tablet Take 325 mg by mouth every other day.   gabapentin (NEURONTIN) 100 MG capsule Take 2 tabs 3 x day for neuropathy by mouth   hydroxychloroquine (PLAQUENIL) 200 MG tablet Take 200 mg by mouth daily.    Infant Care Products [facility]) OINT Apply to buttock wound topically every day and evening shift for skin protection   lactose free nutrition (BOOST) LIQD Take 237 mLs by mouth 3 (three) times daily between meals.   levothyroxine (SYNTHROID) 50 MCG tablet Take 50 mcg by mouth daily before breakfast.   magnesium oxide (MAG-OX) 400 (240 Mg) MG tablet Take 400 mg by mouth daily.   metoprolol tartrate (LOPRESSOR) 25 MG tablet Take 0.5 tablets (12.5 mg total) by mouth 2 (two) times daily.   midodrine (PROAMATINE) 10 MG tablet Take 1 tablet (10 mg total) by mouth 3 (three) times daily with meals.   mirtazapine (REMERON) 15 MG tablet Take 15 mg by mouth at bedtime.   Multiple Vitamins-Minerals (DECUBI-VITE PO) Take 1 tablet by mouth daily. For wound healing   ondansetron (ZOFRAN) 8 MG tablet Take 8 mg by mouth every 8 (eight) hours as needed for nausea or vomiting.   OXYGEN 2lpm continuously for dyspnea.   pantoprazole (PROTONIX) 40 MG tablet TAKE 1 TABLET TWICE DAILY   sucralfate (CARAFATE) 1 GM/10ML suspension Take 1 g by mouth 4 (four) times daily -  with meals and at bedtime.   [DISCONTINUED] ciprofloxacin (CILOXAN) 0.3 % ophthalmic solution Place 1 drop into both eyes every 4 (four) hours while awake.   [DISCONTINUED] LORazepam (ATIVAN) 1 MG tablet Take 1 tablet (1 mg total) by mouth every 8 (eight) hours as needed for anxiety.   [DISCONTINUED] morphine (ROXANOL) 20 MG/ML concentrated solution Take 0.25 mLs (5 mg total) by mouth every 4 (four) hours as needed for severe pain.   No facility-administered encounter medications on file as of 12/16/2022.    Review of Systems  Immunization History  Administered Date(s) Administered   Fluad Quad(high Dose 65+) 07/05/2022   Influenza Inj Mdck Quad Pf 09/21/2017, 09/16/2019   PFIZER Comirnaty(Gray Top)Covid-19 Tri-Sucrose Vaccine 03/16/2021   PFIZER(Purple Top)SARS-COV-2 Vaccination 11/01/2019, 11/22/2019   PPD Test 09/22/2022   Pertinent  Health  Maintenance Due  Topic Date Due   OPHTHALMOLOGY EXAM  05/10/2022   HEMOGLOBIN A1C  01/02/2023   FOOT EXAM  09/29/2023   INFLUENZA VACCINE  Completed   DEXA SCAN  Completed      07/05/2022    9:00 PM 07/06/2022    8:00 AM 08/30/2022    9:27 AM 09/05/2022    6:07 PM 09/06/2022    6:03 AM  Fall Risk  Falls in the past year?   1    Was there an injury with Fall?   0    Fall Risk Category Calculator   2    Fall Risk Category (Retired)   Moderate    (RETIRED) Patient Fall Risk Level High fall risk High fall risk  Low fall risk Low fall risk   Functional Status Survey:  Vitals:   12/16/22 1352  BP: (!) 110/52  Pulse: 72  Resp: 16  Temp: 97.7 F (36.5 C)  SpO2: 100%  Weight: 161 lb (73 kg)  Height: 5\' 4"  (1.626 m)   Body mass index is 27.64 kg/m. Physical Exam Vitals reviewed.  Constitutional:      Comments: Chronically ill-appearing  Pulmonary:     Effort: Pulmonary effort is normal.  Abdominal:     General: Abdomen is flat.  Skin:    General: Skin is dry.  Neurological:     Mental Status: She is alert.     Labs reviewed: Recent Labs    11/29/22 0334 11/30/22 0518 12/01/22 0309 12/02/22 0648  NA 134* 134* 136 135  K 4.2 4.6 4.2 4.2  CL 106 108 110 109  CO2 20* 20* 20* 20*  GLUCOSE 66* 77 85 94  BUN 25* 28* 29* 31*  CREATININE 1.19* 1.23* 1.22* 1.24*  CALCIUM 8.4* 8.4* 8.3* 8.8*  MG 1.9 1.9  --  1.7  PHOS 3.0 3.1  --  3.0   Recent Labs    09/05/22 1809 11/14/22 0000 11/22/22 1612  AST 16 12* 19  ALT 9 4* 7  ALKPHOS 59 80 97  BILITOT 0.4  --  0.5  PROT 6.7  --  6.4*  ALBUMIN 3.4* 2.2* 2.3*   Recent Labs    10/13/22 0000 10/24/22 0000 11/14/22 0000 11/22/22 1612 11/30/22 0518 12/01/22 0309 12/02/22 0648  WBC 4.9 5.9 5.5   < > 8.6 7.6 8.8  NEUTROABS 2,372.00 2,207.00 2,403.00  --   --   --   --   HGB 9.0* 9.9* 10.0*   < > 8.9* 9.0* 9.6*  HCT 29* 31* 31*   < > 27.7* 27.3* 30.6*  MCV  --   --   --    < > 93.3 93.8 96.5  PLT 230 208   --    < > 178 164 165   < > = values in this interval not displayed.   Lab Results  Component Value Date   TSH 10.939 (H) 11/23/2022   Lab Results  Component Value Date   HGBA1C 5.4 07/03/2022   Lab Results  Component Value Date   CHOL 117 07/03/2022   HDL 38 (L) 07/03/2022   LDLCALC 63 07/03/2022   TRIG 82 07/03/2022   CHOLHDL 3.1 07/03/2022    Significant Diagnostic Results in last 30 days:  PERIPHERAL VASCULAR CATHETERIZATION  Result Date: 11/28/2022 See surgical note for result.  DG Abd 1 View  Result Date: 11/26/2022 CLINICAL DATA:  Small-bowel obstruction, patient reports bowel movement EXAM: ABDOMEN - 1 VIEW COMPARISON:  None Available. FINDINGS: Nonobstructive pattern of bowel gas scattered gas present to the descending colon. No significant burden of stool. No free air in the abdomen on supine radiographs. IMPRESSION: Nonobstructive pattern of bowel gas with scattered gas present to the descending colon. No significant burden of stool. Electronically Signed   By: Delanna Ahmadi M.D.   On: 11/26/2022 10:44   ECHOCARDIOGRAM COMPLETE  Result Date: 11/24/2022    ECHOCARDIOGRAM REPORT   Patient Name:   GILBERT BITTENBENDER Date of Exam: 11/23/2022 Medical Rec #:  [id number]   Height:       64.0 in Accession #:    [id number]  Weight:       159.4 lb Date of Birth:  1927-06-27    BSA:          1.776 m Patient Age:  89 years    BP:           99/59 mmHg Patient Gender: F           HR:           101 bpm. Exam Location:  [facility] Procedure: 2D Echo, Cardiac Doppler and Color Doppler Indications:     R78.81 Bacteremia  History:         Patient has prior history of Echocardiogram examinations, most                  recent 07/04/2022. Risk Factors:Hypertension.  Sonographer:     Cresenciano Lick RDCS Referring Phys:  [id number] Val Riles Diagnosing Phys: Ida Rogue MD IMPRESSIONS  1. Left ventricular ejection fraction, by estimation, is 30 to 35%. The left ventricle has moderately decreased  function. The left ventricle demonstrates global hypokinesis with severe hypokinesis of the anterior and anteroseptal wall. There is moderate  left ventricular hypertrophy. Left ventricular diastolic parameters are indeterminate.  2. Right ventricular systolic function is normal. The right ventricular size is normal. There is normal pulmonary artery systolic pressure.  3. Left atrial size was mildly dilated.  4. The mitral valve is normal in structure. Mild mitral valve regurgitation. No evidence of mitral stenosis. Moderate mitral annular calcification.  5. Tricuspid valve regurgitation is severe.  6. The aortic valve is normal in structure. There is severe calcifcation of the aortic valve. Aortic valve regurgitation is not visualized. Moderate to severe aortic valve stenosis, though degree of stenosis may be underestimated in setting of low EF. Aortic valve area, by VTI measures 0.42 cm. Aortic valve mean gradient measures 18.6 mmHg. Aortic valve Vmax measures 2.76 m/s.  7. There is borderline dilatation of the aortic root, measuring 37 mm.  8. The inferior vena cava is normal in size with greater than 50% respiratory variability, suggesting right atrial pressure of 3 mmHg. FINDINGS  Left Ventricle: Left ventricular ejection fraction, by estimation, is 30 to 35%. The left ventricle has moderately decreased function. The left ventricle demonstrates global hypokinesis. The left ventricular internal cavity size was normal in size. There is moderate left ventricular hypertrophy. Left ventricular diastolic parameters are indeterminate. Right Ventricle: The right ventricular size is normal. No increase in right ventricular wall thickness. Right ventricular systolic function is normal. There is normal pulmonary artery systolic pressure. The tricuspid regurgitant velocity is 2.41 m/s, and  with an assumed right atrial pressure of 5 mmHg, the estimated right ventricular systolic pressure is [id number] mmHg. Left Atrium: Left  atrial size was mildly dilated. Right Atrium: Right atrial size was normal in size. Pericardium: There is no evidence of pericardial effusion. Mitral Valve: The mitral valve is normal in structure. Moderate mitral annular calcification. Mild mitral valve regurgitation. No evidence of mitral valve stenosis. Tricuspid Valve: The tricuspid valve is normal in structure. Tricuspid valve regurgitation is severe. No evidence of tricuspid stenosis. Aortic Valve: The aortic valve is normal in structure. There is severe calcifcation of the aortic valve. Aortic valve regurgitation is not visualized. Moderate to severe aortic stenosis is present. Aortic valve mean gradient measures 18.6 mmHg. Aortic valve peak gradient measures 30.4 mmHg. Aortic valve area, by VTI measures 0.42 cm. Pulmonic Valve: The pulmonic valve was normal in structure. Pulmonic valve regurgitation is mild. No evidence of pulmonic stenosis. Aorta: The aortic root is normal in size and structure. There is borderline dilatation of the aortic root, measuring 37 mm. Venous: The inferior vena cava is normal in size  with greater than 50% respiratory variability, suggesting right atrial pressure of 3 mmHg. IAS/Shunts: No atrial level shunt detected by color flow Doppler.  LEFT VENTRICLE PLAX 2D LVIDd:         4.10 cm LVIDs:         3.50 cm LV PW:         1.20 cm LV IVS:        1.20 cm LVOT diam:     2.00 cm LV SV:         20 LV SV Index:   11 LVOT Area:     3.14 cm  RIGHT VENTRICLE            IVC RV Basal diam:  3.70 cm    IVC diam: 1.10 cm RV S prime:     8.77 cm/s LEFT ATRIUM             Index        RIGHT ATRIUM           Index LA diam:        4.20 cm 2.36 cm/m   RA Area:     26.60 cm LA Vol (A2C):   80.1 ml 45.09 ml/m  RA Volume:   101.00 ml 56.85 ml/m LA Vol (A4C):   62.4 ml 35.13 ml/m LA Biplane Vol: 72.0 ml 40.53 ml/m  AORTIC VALVE AV Area (Vmax):    0.47 cm AV Area (Vmean):   0.43 cm AV Area (VTI):     0.42 cm AV Vmax:           275.80 cm/s AV  Vmean:          202.600 cm/s AV VTI:            0.487 m AV Peak Grad:      30.4 mmHg AV Mean Grad:      18.6 mmHg LVOT Vmax:         41.27 cm/s LVOT Vmean:        27.433 cm/s LVOT VTI:          0.064 m LVOT/AV VTI ratio: 0.13  AORTA Ao Root diam: 3.20 cm Ao Asc diam:  3.70 cm MV E velocity: 110.00 cm/s  TRICUSPID VALVE                             TR Peak grad:   23.2 mmHg                             TR Vmax:        241.00 cm/s                              SHUNTS                             Systemic VTI:  0.06 m                             Systemic Diam: 2.00 cm Ida Rogue MD Electronically signed by Ida Rogue MD Signature Date/Time: 11/24/2022/7:34:34 AM    Final    DG Chest Port 1 View  Result Date: 11/22/2022 CLINICAL DATA:  Shortness of breath EXAM: PORTABLE CHEST 1 VIEW COMPARISON:  Chest x-ray 07/03/2022  FINDINGS: There are small bilateral pleural effusions. There is no focal lung infiltrate or pneumothorax. The heart is mildly enlarged, unchanged. Severe degenerative changes affect the shoulders. IMPRESSION: Small bilateral pleural effusions. Electronically Signed   By: Ronney Asters M.D.   On: 11/22/2022 22:32   DG Foot 2 Views Left  Result Date: 11/22/2022 CLINICAL DATA:  Left foot ischemia EXAM: LEFT FOOT - 2 VIEW COMPARISON:  None Available. FINDINGS: Normal alignment. No acute fracture or dislocation. Mild degenerative changes involving the first metatarsophalangeal joint and midfoot. Osseous structures diffusely osteopenic. No erosions or abnormal periosteal reaction. Mild soft tissue swelling of the dorsum of the left forefoot. Vascular calcifications noted. IMPRESSION: 1. Mild soft tissue swelling. No acute fracture or dislocation. Electronically Signed   By: Fidela Salisbury M.D.   On: 11/22/2022 17:55    Assessment/Plan Acute on chronic HFrEF (heart failure with reduced ejection fraction) ([place])  Hypoglycemia Patient has had signficant decline since most recent hospitlaization.  Wounds have progressed and are non-healing. Breathing status has remained tenuous, however, she is currently off of oxygen. Patient has had multiple episodes of hypoglycemia on lab checks. Ordered PM snacks. BID glucose checks for the next 3 days. Based on conversation with family, will plan to transition patient to hospice in hopes of maintaining comfort and no longer seeking curative measures/  Family/ staff Communication: ***  Labs/tests ordered:  ***

## 2022-12-22 DIAGNOSIS — J9691 Respiratory failure, unspecified with hypoxia: Secondary | ICD-10-CM | POA: Diagnosis not present

## 2022-12-22 DIAGNOSIS — G311 Senile degeneration of brain, not elsewhere classified: Secondary | ICD-10-CM | POA: Diagnosis not present

## 2022-12-22 DIAGNOSIS — L03116 Cellulitis of left lower limb: Secondary | ICD-10-CM | POA: Diagnosis not present

## 2022-12-22 DIAGNOSIS — E039 Hypothyroidism, unspecified: Secondary | ICD-10-CM | POA: Diagnosis not present

## 2022-12-22 DIAGNOSIS — J449 Chronic obstructive pulmonary disease, unspecified: Secondary | ICD-10-CM | POA: Diagnosis not present

## 2022-12-22 DIAGNOSIS — I509 Heart failure, unspecified: Secondary | ICD-10-CM | POA: Diagnosis not present

## 2022-12-22 DIAGNOSIS — R1310 Dysphagia, unspecified: Secondary | ICD-10-CM | POA: Diagnosis not present

## 2022-12-22 DIAGNOSIS — R634 Abnormal weight loss: Secondary | ICD-10-CM | POA: Diagnosis not present

## 2022-12-22 DIAGNOSIS — I70209 Unspecified atherosclerosis of native arteries of extremities, unspecified extremity: Secondary | ICD-10-CM | POA: Diagnosis not present

## 2022-12-22 DIAGNOSIS — I951 Orthostatic hypotension: Secondary | ICD-10-CM | POA: Diagnosis not present

## 2022-12-22 DIAGNOSIS — R112 Nausea with vomiting, unspecified: Secondary | ICD-10-CM | POA: Diagnosis not present

## 2022-12-22 DIAGNOSIS — I130 Hypertensive heart and chronic kidney disease with heart failure and stage 1 through stage 4 chronic kidney disease, or unspecified chronic kidney disease: Secondary | ICD-10-CM | POA: Diagnosis not present

## 2022-12-22 DIAGNOSIS — F0282 Dementia in other diseases classified elsewhere, unspecified severity, with psychotic disturbance: Secondary | ICD-10-CM | POA: Diagnosis not present

## 2022-12-22 DIAGNOSIS — I714 Abdominal aortic aneurysm, without rupture, unspecified: Secondary | ICD-10-CM | POA: Diagnosis not present

## 2022-12-22 DIAGNOSIS — N1831 Chronic kidney disease, stage 3a: Secondary | ICD-10-CM | POA: Diagnosis not present

## 2022-12-22 DIAGNOSIS — M810 Age-related osteoporosis without current pathological fracture: Secondary | ICD-10-CM | POA: Diagnosis not present

## 2022-12-22 DIAGNOSIS — I4891 Unspecified atrial fibrillation: Secondary | ICD-10-CM | POA: Diagnosis not present

## 2022-12-23 DIAGNOSIS — N1831 Chronic kidney disease, stage 3a: Secondary | ICD-10-CM | POA: Diagnosis not present

## 2022-12-23 DIAGNOSIS — G311 Senile degeneration of brain, not elsewhere classified: Secondary | ICD-10-CM | POA: Diagnosis not present

## 2022-12-23 DIAGNOSIS — I509 Heart failure, unspecified: Secondary | ICD-10-CM | POA: Diagnosis not present

## 2022-12-23 DIAGNOSIS — I130 Hypertensive heart and chronic kidney disease with heart failure and stage 1 through stage 4 chronic kidney disease, or unspecified chronic kidney disease: Secondary | ICD-10-CM | POA: Diagnosis not present

## 2022-12-23 DIAGNOSIS — L03116 Cellulitis of left lower limb: Secondary | ICD-10-CM | POA: Diagnosis not present

## 2022-12-23 DIAGNOSIS — F0282 Dementia in other diseases classified elsewhere, unspecified severity, with psychotic disturbance: Secondary | ICD-10-CM | POA: Diagnosis not present

## 2022-12-26 DIAGNOSIS — L03116 Cellulitis of left lower limb: Secondary | ICD-10-CM | POA: Diagnosis not present

## 2022-12-26 DIAGNOSIS — E039 Hypothyroidism, unspecified: Secondary | ICD-10-CM | POA: Diagnosis not present

## 2022-12-26 DIAGNOSIS — I714 Abdominal aortic aneurysm, without rupture, unspecified: Secondary | ICD-10-CM | POA: Diagnosis not present

## 2022-12-26 DIAGNOSIS — I951 Orthostatic hypotension: Secondary | ICD-10-CM | POA: Diagnosis not present

## 2022-12-26 DIAGNOSIS — I130 Hypertensive heart and chronic kidney disease with heart failure and stage 1 through stage 4 chronic kidney disease, or unspecified chronic kidney disease: Secondary | ICD-10-CM | POA: Diagnosis not present

## 2022-12-26 DIAGNOSIS — R1310 Dysphagia, unspecified: Secondary | ICD-10-CM | POA: Diagnosis not present

## 2022-12-26 DIAGNOSIS — F0282 Dementia in other diseases classified elsewhere, unspecified severity, with psychotic disturbance: Secondary | ICD-10-CM | POA: Diagnosis not present

## 2022-12-26 DIAGNOSIS — I509 Heart failure, unspecified: Secondary | ICD-10-CM | POA: Diagnosis not present

## 2022-12-26 DIAGNOSIS — G311 Senile degeneration of brain, not elsewhere classified: Secondary | ICD-10-CM | POA: Diagnosis not present

## 2022-12-26 DIAGNOSIS — I70209 Unspecified atherosclerosis of native arteries of extremities, unspecified extremity: Secondary | ICD-10-CM | POA: Diagnosis not present

## 2022-12-26 DIAGNOSIS — R634 Abnormal weight loss: Secondary | ICD-10-CM | POA: Diagnosis not present

## 2022-12-26 DIAGNOSIS — R112 Nausea with vomiting, unspecified: Secondary | ICD-10-CM | POA: Diagnosis not present

## 2022-12-26 DIAGNOSIS — M810 Age-related osteoporosis without current pathological fracture: Secondary | ICD-10-CM | POA: Diagnosis not present

## 2022-12-26 DIAGNOSIS — J9691 Respiratory failure, unspecified with hypoxia: Secondary | ICD-10-CM | POA: Diagnosis not present

## 2022-12-26 DIAGNOSIS — J449 Chronic obstructive pulmonary disease, unspecified: Secondary | ICD-10-CM | POA: Diagnosis not present

## 2022-12-26 DIAGNOSIS — N1831 Chronic kidney disease, stage 3a: Secondary | ICD-10-CM | POA: Diagnosis not present

## 2022-12-26 DIAGNOSIS — I4891 Unspecified atrial fibrillation: Secondary | ICD-10-CM | POA: Diagnosis not present

## 2022-12-27 DIAGNOSIS — I509 Heart failure, unspecified: Secondary | ICD-10-CM | POA: Diagnosis not present

## 2022-12-27 DIAGNOSIS — F0282 Dementia in other diseases classified elsewhere, unspecified severity, with psychotic disturbance: Secondary | ICD-10-CM | POA: Diagnosis not present

## 2022-12-27 DIAGNOSIS — N1831 Chronic kidney disease, stage 3a: Secondary | ICD-10-CM | POA: Diagnosis not present

## 2022-12-27 DIAGNOSIS — L03116 Cellulitis of left lower limb: Secondary | ICD-10-CM | POA: Diagnosis not present

## 2022-12-27 DIAGNOSIS — I130 Hypertensive heart and chronic kidney disease with heart failure and stage 1 through stage 4 chronic kidney disease, or unspecified chronic kidney disease: Secondary | ICD-10-CM | POA: Diagnosis not present

## 2022-12-27 DIAGNOSIS — G311 Senile degeneration of brain, not elsewhere classified: Secondary | ICD-10-CM | POA: Diagnosis not present

## 2022-12-28 DIAGNOSIS — N1831 Chronic kidney disease, stage 3a: Secondary | ICD-10-CM | POA: Diagnosis not present

## 2022-12-28 DIAGNOSIS — G311 Senile degeneration of brain, not elsewhere classified: Secondary | ICD-10-CM | POA: Diagnosis not present

## 2022-12-28 DIAGNOSIS — L03116 Cellulitis of left lower limb: Secondary | ICD-10-CM | POA: Diagnosis not present

## 2022-12-28 DIAGNOSIS — I509 Heart failure, unspecified: Secondary | ICD-10-CM | POA: Diagnosis not present

## 2022-12-28 DIAGNOSIS — F0282 Dementia in other diseases classified elsewhere, unspecified severity, with psychotic disturbance: Secondary | ICD-10-CM | POA: Diagnosis not present

## 2022-12-28 DIAGNOSIS — I130 Hypertensive heart and chronic kidney disease with heart failure and stage 1 through stage 4 chronic kidney disease, or unspecified chronic kidney disease: Secondary | ICD-10-CM | POA: Diagnosis not present

## 2022-12-29 DIAGNOSIS — L03116 Cellulitis of left lower limb: Secondary | ICD-10-CM | POA: Diagnosis not present

## 2022-12-29 DIAGNOSIS — G311 Senile degeneration of brain, not elsewhere classified: Secondary | ICD-10-CM | POA: Diagnosis not present

## 2022-12-29 DIAGNOSIS — I509 Heart failure, unspecified: Secondary | ICD-10-CM | POA: Diagnosis not present

## 2022-12-29 DIAGNOSIS — I130 Hypertensive heart and chronic kidney disease with heart failure and stage 1 through stage 4 chronic kidney disease, or unspecified chronic kidney disease: Secondary | ICD-10-CM | POA: Diagnosis not present

## 2022-12-29 DIAGNOSIS — N1831 Chronic kidney disease, stage 3a: Secondary | ICD-10-CM | POA: Diagnosis not present

## 2022-12-29 DIAGNOSIS — F0282 Dementia in other diseases classified elsewhere, unspecified severity, with psychotic disturbance: Secondary | ICD-10-CM | POA: Diagnosis not present

## 2023-01-02 ENCOUNTER — Encounter: Payer: Self-pay | Admitting: Student

## 2023-01-02 ENCOUNTER — Non-Acute Institutional Stay (SKILLED_NURSING_FACILITY): Payer: Medicare Other | Admitting: Student

## 2023-01-02 DIAGNOSIS — F0282 Dementia in other diseases classified elsewhere, unspecified severity, with psychotic disturbance: Secondary | ICD-10-CM | POA: Diagnosis not present

## 2023-01-02 DIAGNOSIS — Z515 Encounter for palliative care: Secondary | ICD-10-CM | POA: Diagnosis not present

## 2023-01-02 DIAGNOSIS — G311 Senile degeneration of brain, not elsewhere classified: Secondary | ICD-10-CM | POA: Diagnosis not present

## 2023-01-02 DIAGNOSIS — L03116 Cellulitis of left lower limb: Secondary | ICD-10-CM | POA: Diagnosis not present

## 2023-01-02 DIAGNOSIS — N1831 Chronic kidney disease, stage 3a: Secondary | ICD-10-CM | POA: Diagnosis not present

## 2023-01-02 DIAGNOSIS — I130 Hypertensive heart and chronic kidney disease with heart failure and stage 1 through stage 4 chronic kidney disease, or unspecified chronic kidney disease: Secondary | ICD-10-CM | POA: Diagnosis not present

## 2023-01-02 DIAGNOSIS — I509 Heart failure, unspecified: Secondary | ICD-10-CM | POA: Diagnosis not present

## 2023-01-02 NOTE — Progress Notes (Signed)
Location:  Other [facility].  Nursing Home Room Number: [facility] [address] Place of Service:  SNF [id number]) Provider:  Earnestine Mealing, MD  Patient Care Team: Earnestine Mealing, MD as PCP - General (Family Medicine) Kieth Brightly, MD as Consulting Physician (General Surgery) Monika Salk, [facility] as Pharmacist (Pharmacist)  Extended Emergency Contact Information Primary Emergency Contact: [name],Ginger Address: [address]          [place], [place] [address] [place] Phone: [phone] Relation: Daughter Secondary Emergency Contact: Shouse,randy Mobile Phone: [phone] Relation: Son  Code Status:  DNR Goals of care: Advanced Directive information    01/02/2023   10:12 AM  Advanced Directives  Does Patient Have a Medical Advance Directive? Yes  Type of Estate agent of [employer];Living will;Out of facility DNR (pink MOST or yellow form)  Does patient want to make changes to medical advance directive? No - Patient declined  Copy of Healthcare Power of Attorney in Chart? Yes - validated most recent copy scanned in chart (See row information)     Chief Complaint  Patient presents with   Acute Visit    Hospice Care.     HPI:  Pt is a 89 y.o. female seen today for an acute visit for evaluation of patient's status. Patient keeps her eyes closed, but is answering hello and says yes, she would like some water. She isn't able to suck on the straw well enough to make sure that she takes a drink.   Nursing concerned that patient is no longer alert enough to take chronic medications.    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   EYE SURGERY  2002   FOOT SURGERY  1996   HERNIA REPAIR  2013   left breast biopsy   2011   LOWER EXTREMITY ANGIOGRAPHY Left 11/28/2022   Procedure: Lower Extremity Angiography;  Surgeon: Annice Needy, MD;  Location: [facility] INVASIVE CV LAB;  Service: Cardiovascular;  Laterality: Left;    Allergies  Allergen Reactions   Aspirin Itching and Other (See Comments)    Dizzy/passing out   Augmentin [Amoxicillin-Pot Clavulanate]    Codeine Other (See Comments)    dizziness   Doxycycline Hyclate Other (See Comments)   Sulfamethoxazole-Trimethoprim Diarrhea   Tape Itching   Vitamin D Analogs    Azithromycin Rash and Hives    Outpatient Encounter Medications as of 01/02/2023  Medication Sig   acetaminophen (TYLENOL) 500 MG tablet Take 500 mg by mouth 3 (three) times daily. Take one tablet by mouth every 8 hours as needed.   amiodarone (PACERONE) 100 MG tablet Take 100 mg by mouth daily.   apixaban (ELIQUIS) 2.5 MG TABS tablet Take 1 tablet (2.5 mg total) by mouth 2 (two) times daily.   clopidogrel (PLAVIX) 75 MG tablet Take 1 tablet (75 mg total)  by mouth daily.   clotrimazole-betamethasone (LOTRISONE) cream Apply to entire buttocks topically daily and evening   diclofenac Sodium (VOLTAREN) 1 % GEL Apply 4 g topically 4 (four) times daily.   gabapentin (NEURONTIN) 100 MG capsule Take 2 tabs 3 x day for neuropathy by mouth   Infant Care Products (DERMACLOUD) OINT Apply to buttock wound topically every day and evening shift for skin protection   lactose free nutrition (BOOST) LIQD Take 237 mLs by mouth 3 (three) times daily between meals.   metoprolol tartrate (LOPRESSOR) 25 MG tablet Take 0.5 tablets (12.5 mg total) by mouth 2 (two) times daily.   midodrine (PROAMATINE) 10 MG tablet Take 10 mg by mouth  3 (three) times daily.   mirtazapine (REMERON) 15 MG tablet Take 15 mg by mouth at bedtime.   Morphine Sulfate, Concentrate, 5 MG/0.25ML SOLN Give 0.2425ml by mouth every 4 hours as needed.   nystatin (MYCOSTATIN) 100000 UNIT/ML suspension Take 5 mLs by mouth every 4 (four) hours as needed.   ondansetron (ZOFRAN) 8 MG tablet Take 8 mg by mouth every 8 (eight) hours as needed for nausea or vomiting.   OXYGEN 2lpm continuously for dyspnea.   pantoprazole (PROTONIX) 40 MG tablet TAKE 1 TABLET TWICE DAILY   [DISCONTINUED] calcium carbonate (OSCAL) 1500 (600 Ca) MG TABS tablet Take 600 mg of elemental calcium by mouth 2 (two) times daily with a meal.   [DISCONTINUED] Dextromethorphan-guaiFENesin 20-400 MG TABS Take 1 tablet by mouth 2 (two) times daily.   [DISCONTINUED] ferrous sulfate 325 (65 FE) MG EC tablet Take 325 mg by mouth every other day.   [DISCONTINUED] hydroxychloroquine (PLAQUENIL) 200 MG tablet Take 200 mg by mouth daily.   [DISCONTINUED] levothyroxine (SYNTHROID) 50 MCG tablet Take 50 mcg by mouth daily before breakfast.   [DISCONTINUED] magnesium oxide (MAG-OX) 400 (240 Mg) MG tablet Take 400 mg by mouth daily.   [DISCONTINUED] Multiple Vitamins-Minerals (DECUBI-VITE PO) Take 1 tablet by mouth daily. For wound healing   [DISCONTINUED] sucralfate (CARAFATE) 1 GM/10ML suspension Take 1 g by mouth 4 (four) times daily -  with meals and at bedtime.   No facility-administered encounter medications on file as of 01/02/2023.    Review of Systems  Immunization History  Administered Date(s) Administered   Fluad Quad(high Dose 65+) 07/05/2022   Influenza Inj Mdck Quad Pf 09/21/2017, 09/16/2019   PFIZER Comirnaty(Gray Top)Covid-19 Tri-Sucrose Vaccine 03/16/2021   PFIZER(Purple Top)SARS-COV-2 Vaccination 11/01/2019, 11/22/2019   PPD Test 09/22/2022   Pertinent  Health Maintenance Due  Topic Date Due   OPHTHALMOLOGY EXAM  05/10/2022   HEMOGLOBIN A1C  01/02/2023   INFLUENZA VACCINE   04/27/2023   FOOT EXAM  09/29/2023   DEXA SCAN  Completed      07/05/2022    9:00 PM 07/06/2022    8:00 AM 08/30/2022    9:27 AM 09/05/2022    6:07 PM 09/06/2022    6:03 AM  Fall Risk  Falls in the past year?   1    Was there an injury with Fall?   0    Fall Risk Category Calculator   2    Fall Risk Category (Retired)   Moderate    (RETIRED) Patient Fall Risk Level High fall risk High fall risk  Low fall risk Low fall risk   Functional Status Survey:    Vitals:   01/02/23 1002  BP: 98/60  Pulse: (!) 102  Resp: 16  Temp: 99.1 F (37.3 C)  SpO2: 100%  Weight: 158  lb 3.2 oz (71.8 kg)  Height: 5\' 4"  (1.626 m)   Body mass index is 27.15 kg/m. Physical Exam Constitutional:      Comments: Patient laying in bed with eyes closed  Cardiovascular:     Comments: DP pulses difficult to palpate Pulmonary:     Effort: Pulmonary effort is normal.  Skin:    General: Skin is warm.     Comments: Bilateral toes with necrotic tissue noted  Neurological:     Mental Status: She is disoriented.     Labs reviewed: Recent Labs    11/29/22 0334 11/30/22 0518 12/01/22 0309 12/02/22 0648  NA 134* 134* 136 135  K 4.2 4.6 4.2 4.2  CL 106 108 110 109  CO2 20* 20* 20* 20*  GLUCOSE 66* 77 85 94  BUN 25* 28* 29* 31*  CREATININE 1.19* 1.23* 1.22* 1.24*  CALCIUM 8.4* 8.4* 8.3* 8.8*  MG 1.9 1.9  --  1.7  PHOS 3.0 3.1  --  3.0   Recent Labs    09/05/22 1809 11/14/22 0000 11/22/22 1612  AST 16 12* 19  ALT 9 4* 7  ALKPHOS 59 80 97  BILITOT 0.4  --  0.5  PROT 6.7  --  6.4*  ALBUMIN 3.4* 2.2* 2.3*   Recent Labs    10/13/22 0000 10/24/22 0000 11/14/22 0000 11/22/22 1612 11/30/22 0518 12/01/22 0309 12/02/22 0648  WBC 4.9 5.9 5.5   < > 8.6 7.6 8.8  NEUTROABS 2,372.00 2,207.00 2,403.00  --   --   --   --   HGB 9.0* 9.9* 10.0*   < > 8.9* 9.0* 9.6*  HCT 29* 31* 31*   < > 27.7* 27.3* 30.6*  MCV  --   --   --    < > 93.3 93.8 96.5  PLT 230 208  --    < > 178 164 165   < > =  values in this interval not displayed.   Lab Results  Component Value Date   TSH 10.939 (H) 11/23/2022   Lab Results  Component Value Date   HGBA1C 5.4 07/03/2022   Lab Results  Component Value Date   CHOL 117 07/03/2022   HDL 38 (L) 07/03/2022   LDLCALC 63 07/03/2022   TRIG 82 07/03/2022   CHOLHDL 3.1 07/03/2022    Significant Diagnostic Results in last 30 days:  No results found.  Assessment/Plan Hospice care patient Patient has continued to decline. Now has pureed diet. Drinking only protein shakes, however, patient is too weak to drink fluids through a straw. Disoriented. Poor circulation. Likely beginning decline. Patient now with significant decline due to terminal illness. Discontinue chronic medications and continue comfort medications.   Family/ staff Communication: nursing  Labs/tests ordered:  none

## 2023-01-03 DIAGNOSIS — L03116 Cellulitis of left lower limb: Secondary | ICD-10-CM | POA: Diagnosis not present

## 2023-01-03 DIAGNOSIS — G311 Senile degeneration of brain, not elsewhere classified: Secondary | ICD-10-CM | POA: Diagnosis not present

## 2023-01-03 DIAGNOSIS — I130 Hypertensive heart and chronic kidney disease with heart failure and stage 1 through stage 4 chronic kidney disease, or unspecified chronic kidney disease: Secondary | ICD-10-CM | POA: Diagnosis not present

## 2023-01-03 DIAGNOSIS — I509 Heart failure, unspecified: Secondary | ICD-10-CM | POA: Diagnosis not present

## 2023-01-03 DIAGNOSIS — Z23 Encounter for immunization: Secondary | ICD-10-CM | POA: Diagnosis not present

## 2023-01-03 DIAGNOSIS — F0282 Dementia in other diseases classified elsewhere, unspecified severity, with psychotic disturbance: Secondary | ICD-10-CM | POA: Diagnosis not present

## 2023-01-03 DIAGNOSIS — N1831 Chronic kidney disease, stage 3a: Secondary | ICD-10-CM | POA: Diagnosis not present

## 2023-01-04 DIAGNOSIS — N1831 Chronic kidney disease, stage 3a: Secondary | ICD-10-CM | POA: Diagnosis not present

## 2023-01-04 DIAGNOSIS — I509 Heart failure, unspecified: Secondary | ICD-10-CM | POA: Diagnosis not present

## 2023-01-04 DIAGNOSIS — F0282 Dementia in other diseases classified elsewhere, unspecified severity, with psychotic disturbance: Secondary | ICD-10-CM | POA: Diagnosis not present

## 2023-01-04 DIAGNOSIS — L03116 Cellulitis of left lower limb: Secondary | ICD-10-CM | POA: Diagnosis not present

## 2023-01-04 DIAGNOSIS — G311 Senile degeneration of brain, not elsewhere classified: Secondary | ICD-10-CM | POA: Diagnosis not present

## 2023-01-04 DIAGNOSIS — I130 Hypertensive heart and chronic kidney disease with heart failure and stage 1 through stage 4 chronic kidney disease, or unspecified chronic kidney disease: Secondary | ICD-10-CM | POA: Diagnosis not present

## 2023-01-05 DIAGNOSIS — I130 Hypertensive heart and chronic kidney disease with heart failure and stage 1 through stage 4 chronic kidney disease, or unspecified chronic kidney disease: Secondary | ICD-10-CM | POA: Diagnosis not present

## 2023-01-05 DIAGNOSIS — L03116 Cellulitis of left lower limb: Secondary | ICD-10-CM | POA: Diagnosis not present

## 2023-01-05 DIAGNOSIS — G311 Senile degeneration of brain, not elsewhere classified: Secondary | ICD-10-CM | POA: Diagnosis not present

## 2023-01-05 DIAGNOSIS — I509 Heart failure, unspecified: Secondary | ICD-10-CM | POA: Diagnosis not present

## 2023-01-05 DIAGNOSIS — F0282 Dementia in other diseases classified elsewhere, unspecified severity, with psychotic disturbance: Secondary | ICD-10-CM | POA: Diagnosis not present

## 2023-01-05 DIAGNOSIS — N1831 Chronic kidney disease, stage 3a: Secondary | ICD-10-CM | POA: Diagnosis not present

## 2023-01-06 DIAGNOSIS — G311 Senile degeneration of brain, not elsewhere classified: Secondary | ICD-10-CM | POA: Diagnosis not present

## 2023-01-06 DIAGNOSIS — I130 Hypertensive heart and chronic kidney disease with heart failure and stage 1 through stage 4 chronic kidney disease, or unspecified chronic kidney disease: Secondary | ICD-10-CM | POA: Diagnosis not present

## 2023-01-06 DIAGNOSIS — L03116 Cellulitis of left lower limb: Secondary | ICD-10-CM | POA: Diagnosis not present

## 2023-01-06 DIAGNOSIS — I509 Heart failure, unspecified: Secondary | ICD-10-CM | POA: Diagnosis not present

## 2023-01-06 DIAGNOSIS — N1831 Chronic kidney disease, stage 3a: Secondary | ICD-10-CM | POA: Diagnosis not present

## 2023-01-06 DIAGNOSIS — F0282 Dementia in other diseases classified elsewhere, unspecified severity, with psychotic disturbance: Secondary | ICD-10-CM | POA: Diagnosis not present

## 2023-01-07 DIAGNOSIS — F0282 Dementia in other diseases classified elsewhere, unspecified severity, with psychotic disturbance: Secondary | ICD-10-CM | POA: Diagnosis not present

## 2023-01-07 DIAGNOSIS — I130 Hypertensive heart and chronic kidney disease with heart failure and stage 1 through stage 4 chronic kidney disease, or unspecified chronic kidney disease: Secondary | ICD-10-CM | POA: Diagnosis not present

## 2023-01-07 DIAGNOSIS — I509 Heart failure, unspecified: Secondary | ICD-10-CM | POA: Diagnosis not present

## 2023-01-07 DIAGNOSIS — L03116 Cellulitis of left lower limb: Secondary | ICD-10-CM | POA: Diagnosis not present

## 2023-01-07 DIAGNOSIS — N1831 Chronic kidney disease, stage 3a: Secondary | ICD-10-CM | POA: Diagnosis not present

## 2023-01-07 DIAGNOSIS — G311 Senile degeneration of brain, not elsewhere classified: Secondary | ICD-10-CM | POA: Diagnosis not present

## 2023-01-08 DIAGNOSIS — N1831 Chronic kidney disease, stage 3a: Secondary | ICD-10-CM | POA: Diagnosis not present

## 2023-01-08 DIAGNOSIS — F0282 Dementia in other diseases classified elsewhere, unspecified severity, with psychotic disturbance: Secondary | ICD-10-CM | POA: Diagnosis not present

## 2023-01-08 DIAGNOSIS — G311 Senile degeneration of brain, not elsewhere classified: Secondary | ICD-10-CM | POA: Diagnosis not present

## 2023-01-08 DIAGNOSIS — I130 Hypertensive heart and chronic kidney disease with heart failure and stage 1 through stage 4 chronic kidney disease, or unspecified chronic kidney disease: Secondary | ICD-10-CM | POA: Diagnosis not present

## 2023-01-08 DIAGNOSIS — I509 Heart failure, unspecified: Secondary | ICD-10-CM | POA: Diagnosis not present

## 2023-01-08 DIAGNOSIS — L03116 Cellulitis of left lower limb: Secondary | ICD-10-CM | POA: Diagnosis not present

## 2023-01-25 DEATH — deceased

## 2023-01-26 ENCOUNTER — Ambulatory Visit: Payer: Medicare Other | Admitting: Nurse Practitioner
